# Patient Record
Sex: Female | Born: 1960 | Race: White | Hispanic: No | Marital: Married | Smoking: Never smoker
Health system: Southern US, Community
[De-identification: ages and names within clinical notes are randomized; demographics above are authoritative.]

## PROBLEM LIST (undated history)

## (undated) DIAGNOSIS — G459 Transient cerebral ischemic attack, unspecified: Secondary | ICD-10-CM

## (undated) DIAGNOSIS — I639 Cerebral infarction, unspecified: Secondary | ICD-10-CM

## (undated) DIAGNOSIS — R519 Headache, unspecified: Secondary | ICD-10-CM

## (undated) DIAGNOSIS — C439 Malignant melanoma of skin, unspecified: Secondary | ICD-10-CM

## (undated) DIAGNOSIS — E538 Deficiency of other specified B group vitamins: Secondary | ICD-10-CM

## (undated) DIAGNOSIS — Z9289 Personal history of other medical treatment: Secondary | ICD-10-CM

## (undated) DIAGNOSIS — T7840XA Allergy, unspecified, initial encounter: Secondary | ICD-10-CM

## (undated) DIAGNOSIS — E785 Hyperlipidemia, unspecified: Secondary | ICD-10-CM

## (undated) DIAGNOSIS — I2510 Atherosclerotic heart disease of native coronary artery without angina pectoris: Secondary | ICD-10-CM

## (undated) DIAGNOSIS — R51 Headache: Secondary | ICD-10-CM

## (undated) DIAGNOSIS — I509 Heart failure, unspecified: Secondary | ICD-10-CM

## (undated) DIAGNOSIS — I429 Cardiomyopathy, unspecified: Secondary | ICD-10-CM

## (undated) DIAGNOSIS — I10 Essential (primary) hypertension: Secondary | ICD-10-CM

## (undated) DIAGNOSIS — K5730 Diverticulosis of large intestine without perforation or abscess without bleeding: Secondary | ICD-10-CM

## (undated) DIAGNOSIS — F411 Generalized anxiety disorder: Secondary | ICD-10-CM

## (undated) DIAGNOSIS — Z8489 Family history of other specified conditions: Secondary | ICD-10-CM

## (undated) DIAGNOSIS — J302 Other seasonal allergic rhinitis: Secondary | ICD-10-CM

## (undated) DIAGNOSIS — E559 Vitamin D deficiency, unspecified: Secondary | ICD-10-CM

## (undated) DIAGNOSIS — I447 Left bundle-branch block, unspecified: Secondary | ICD-10-CM

## (undated) DIAGNOSIS — F419 Anxiety disorder, unspecified: Secondary | ICD-10-CM

## (undated) DIAGNOSIS — S83209A Unspecified tear of unspecified meniscus, current injury, unspecified knee, initial encounter: Secondary | ICD-10-CM

## (undated) HISTORY — DX: Unspecified tear of unspecified meniscus, current injury, unspecified knee, initial encounter: S83.209A

## (undated) HISTORY — DX: Deficiency of other specified B group vitamins: E53.8

## (undated) HISTORY — DX: Atherosclerotic heart disease of native coronary artery without angina pectoris: I25.10

## (undated) HISTORY — DX: Cardiomyopathy, unspecified: I42.9

## (undated) HISTORY — PX: TONSILLECTOMY: SUR1361

## (undated) HISTORY — DX: Personal history of other medical treatment: Z92.89

## (undated) HISTORY — DX: Left bundle-branch block, unspecified: I44.7

## (undated) HISTORY — DX: Allergy, unspecified, initial encounter: T78.40XA

## (undated) HISTORY — PX: KNEE SURGERY: SHX244

## (undated) HISTORY — PX: BREAST REDUCTION SURGERY: SHX8

## (undated) HISTORY — DX: Cerebral infarction, unspecified: I63.9

## (undated) HISTORY — DX: Generalized anxiety disorder: F41.1

## (undated) HISTORY — DX: Essential (primary) hypertension: I10

## (undated) HISTORY — DX: Other seasonal allergic rhinitis: J30.2

## (undated) HISTORY — DX: Vitamin D deficiency, unspecified: E55.9

## (undated) HISTORY — PX: CARDIAC CATHETERIZATION: SHX172

## (undated) HISTORY — DX: Heart failure, unspecified: I50.9

## (undated) HISTORY — PX: REPAIR QUADRICEPS / HAMSTRING MUSCLE: SUR1204

## (undated) HISTORY — DX: Hyperlipidemia, unspecified: E78.5

## (undated) HISTORY — PX: REDUCTION MAMMAPLASTY: SUR839

## (undated) HISTORY — PX: ABLATION: SHX5711

---

## 1998-01-01 ENCOUNTER — Ambulatory Visit (HOSPITAL_COMMUNITY): Admission: RE | Admit: 1998-01-01 | Discharge: 1998-01-01 | Payer: Self-pay | Admitting: Obstetrics and Gynecology

## 1998-05-14 ENCOUNTER — Ambulatory Visit (HOSPITAL_COMMUNITY): Admission: RE | Admit: 1998-05-14 | Discharge: 1998-05-14 | Payer: Self-pay | Admitting: Obstetrics and Gynecology

## 1998-05-14 ENCOUNTER — Encounter: Payer: Self-pay | Admitting: Obstetrics and Gynecology

## 1998-10-15 ENCOUNTER — Ambulatory Visit (HOSPITAL_COMMUNITY): Admission: RE | Admit: 1998-10-15 | Discharge: 1998-10-15 | Payer: Self-pay | Admitting: Cardiovascular Disease

## 1999-12-30 ENCOUNTER — Encounter (INDEPENDENT_AMBULATORY_CARE_PROVIDER_SITE_OTHER): Payer: Self-pay | Admitting: Specialist

## 1999-12-30 ENCOUNTER — Inpatient Hospital Stay (HOSPITAL_COMMUNITY): Admission: AD | Admit: 1999-12-30 | Discharge: 2000-01-02 | Payer: Self-pay | Admitting: Family Medicine

## 2000-01-28 ENCOUNTER — Other Ambulatory Visit: Admission: RE | Admit: 2000-01-28 | Discharge: 2000-01-28 | Payer: Self-pay | Admitting: Obstetrics and Gynecology

## 2000-10-12 ENCOUNTER — Encounter: Payer: Self-pay | Admitting: Internal Medicine

## 2000-10-12 ENCOUNTER — Ambulatory Visit (HOSPITAL_COMMUNITY): Admission: RE | Admit: 2000-10-12 | Discharge: 2000-10-12 | Payer: Self-pay | Admitting: Internal Medicine

## 2001-04-05 ENCOUNTER — Other Ambulatory Visit: Admission: RE | Admit: 2001-04-05 | Discharge: 2001-04-05 | Payer: Self-pay | Admitting: Obstetrics and Gynecology

## 2001-07-11 ENCOUNTER — Encounter: Admission: RE | Admit: 2001-07-11 | Discharge: 2001-07-11 | Payer: Self-pay | Admitting: Internal Medicine

## 2001-07-11 ENCOUNTER — Encounter: Payer: Self-pay | Admitting: Internal Medicine

## 2001-11-24 ENCOUNTER — Encounter: Payer: Self-pay | Admitting: Obstetrics and Gynecology

## 2001-11-24 ENCOUNTER — Ambulatory Visit (HOSPITAL_COMMUNITY): Admission: RE | Admit: 2001-11-24 | Discharge: 2001-11-24 | Payer: Self-pay | Admitting: Obstetrics and Gynecology

## 2001-12-05 ENCOUNTER — Encounter: Payer: Self-pay | Admitting: Obstetrics and Gynecology

## 2001-12-05 ENCOUNTER — Encounter: Admission: RE | Admit: 2001-12-05 | Discharge: 2001-12-05 | Payer: Self-pay | Admitting: Obstetrics and Gynecology

## 2002-06-14 ENCOUNTER — Other Ambulatory Visit: Admission: RE | Admit: 2002-06-14 | Discharge: 2002-06-14 | Payer: Self-pay | Admitting: Obstetrics and Gynecology

## 2002-12-04 ENCOUNTER — Ambulatory Visit (HOSPITAL_COMMUNITY): Admission: RE | Admit: 2002-12-04 | Discharge: 2002-12-04 | Payer: Self-pay | Admitting: Internal Medicine

## 2003-07-19 ENCOUNTER — Other Ambulatory Visit: Admission: RE | Admit: 2003-07-19 | Discharge: 2003-07-19 | Payer: Self-pay | Admitting: Obstetrics and Gynecology

## 2003-12-05 ENCOUNTER — Ambulatory Visit (HOSPITAL_COMMUNITY): Admission: RE | Admit: 2003-12-05 | Discharge: 2003-12-05 | Payer: Self-pay | Admitting: Internal Medicine

## 2004-07-24 ENCOUNTER — Other Ambulatory Visit: Admission: RE | Admit: 2004-07-24 | Discharge: 2004-07-24 | Payer: Self-pay | Admitting: Obstetrics and Gynecology

## 2004-10-03 ENCOUNTER — Encounter (INDEPENDENT_AMBULATORY_CARE_PROVIDER_SITE_OTHER): Payer: Self-pay | Admitting: *Deleted

## 2004-10-03 ENCOUNTER — Ambulatory Visit (HOSPITAL_COMMUNITY): Admission: RE | Admit: 2004-10-03 | Discharge: 2004-10-03 | Payer: Self-pay | Admitting: Obstetrics and Gynecology

## 2004-10-31 ENCOUNTER — Emergency Department (HOSPITAL_COMMUNITY): Admission: EM | Admit: 2004-10-31 | Discharge: 2004-11-01 | Payer: Self-pay | Admitting: Emergency Medicine

## 2004-11-04 ENCOUNTER — Ambulatory Visit (HOSPITAL_BASED_OUTPATIENT_CLINIC_OR_DEPARTMENT_OTHER): Admission: RE | Admit: 2004-11-04 | Discharge: 2004-11-04 | Payer: Self-pay | Admitting: Orthopaedic Surgery

## 2004-11-04 ENCOUNTER — Ambulatory Visit (HOSPITAL_COMMUNITY): Admission: RE | Admit: 2004-11-04 | Discharge: 2004-11-04 | Payer: Self-pay | Admitting: Orthopaedic Surgery

## 2005-01-02 ENCOUNTER — Ambulatory Visit (HOSPITAL_COMMUNITY): Admission: RE | Admit: 2005-01-02 | Discharge: 2005-01-02 | Payer: Self-pay | Admitting: Obstetrics and Gynecology

## 2006-01-15 ENCOUNTER — Ambulatory Visit (HOSPITAL_COMMUNITY): Admission: RE | Admit: 2006-01-15 | Discharge: 2006-01-15 | Payer: Self-pay | Admitting: Obstetrics and Gynecology

## 2007-01-21 ENCOUNTER — Ambulatory Visit (HOSPITAL_COMMUNITY): Admission: RE | Admit: 2007-01-21 | Discharge: 2007-01-21 | Payer: Self-pay | Admitting: Internal Medicine

## 2007-12-29 ENCOUNTER — Encounter (INDEPENDENT_AMBULATORY_CARE_PROVIDER_SITE_OTHER): Payer: Self-pay | Admitting: Obstetrics and Gynecology

## 2007-12-29 ENCOUNTER — Ambulatory Visit (HOSPITAL_COMMUNITY): Admission: RE | Admit: 2007-12-29 | Discharge: 2007-12-29 | Payer: Self-pay | Admitting: Obstetrics and Gynecology

## 2008-01-27 ENCOUNTER — Ambulatory Visit (HOSPITAL_COMMUNITY): Admission: RE | Admit: 2008-01-27 | Discharge: 2008-01-27 | Payer: Self-pay | Admitting: Obstetrics and Gynecology

## 2008-08-24 ENCOUNTER — Emergency Department (HOSPITAL_BASED_OUTPATIENT_CLINIC_OR_DEPARTMENT_OTHER): Admission: EM | Admit: 2008-08-24 | Discharge: 2008-08-24 | Payer: Self-pay | Admitting: Emergency Medicine

## 2008-08-24 ENCOUNTER — Ambulatory Visit: Payer: Self-pay | Admitting: Diagnostic Radiology

## 2009-02-12 ENCOUNTER — Ambulatory Visit (HOSPITAL_COMMUNITY): Admission: RE | Admit: 2009-02-12 | Discharge: 2009-02-12 | Payer: Self-pay | Admitting: Obstetrics and Gynecology

## 2009-04-09 ENCOUNTER — Ambulatory Visit (HOSPITAL_COMMUNITY): Admission: RE | Admit: 2009-04-09 | Discharge: 2009-04-09 | Payer: Self-pay | Admitting: Internal Medicine

## 2009-06-10 ENCOUNTER — Emergency Department (HOSPITAL_BASED_OUTPATIENT_CLINIC_OR_DEPARTMENT_OTHER): Admission: EM | Admit: 2009-06-10 | Discharge: 2009-06-10 | Payer: Self-pay | Admitting: Emergency Medicine

## 2010-02-02 ENCOUNTER — Encounter: Payer: Self-pay | Admitting: Obstetrics and Gynecology

## 2010-02-20 ENCOUNTER — Other Ambulatory Visit: Payer: Self-pay | Admitting: Obstetrics and Gynecology

## 2010-02-20 ENCOUNTER — Other Ambulatory Visit (HOSPITAL_COMMUNITY): Payer: Self-pay | Admitting: Family Medicine

## 2010-02-20 DIAGNOSIS — Z1231 Encounter for screening mammogram for malignant neoplasm of breast: Secondary | ICD-10-CM

## 2010-03-12 ENCOUNTER — Ambulatory Visit (HOSPITAL_COMMUNITY)
Admission: RE | Admit: 2010-03-12 | Discharge: 2010-03-12 | Disposition: A | Discharge status: Home / Self Care | Payer: Managed Care, Other (non HMO) | Source: Ambulatory Visit | Attending: Family Medicine | Admitting: Family Medicine

## 2010-03-12 DIAGNOSIS — Z1231 Encounter for screening mammogram for malignant neoplasm of breast: Secondary | ICD-10-CM | POA: Insufficient documentation

## 2010-03-13 ENCOUNTER — Ambulatory Visit (HOSPITAL_BASED_OUTPATIENT_CLINIC_OR_DEPARTMENT_OTHER)
Admission: RE | Admit: 2010-03-13 | Discharge: 2010-03-13 | Disposition: A | Discharge status: Home / Self Care | Payer: BC Managed Care – PPO | Source: Ambulatory Visit | Attending: Orthopedic Surgery | Admitting: Orthopedic Surgery

## 2010-03-13 DIAGNOSIS — M9420 Chondromalacia, unspecified site: Secondary | ICD-10-CM | POA: Insufficient documentation

## 2010-03-13 DIAGNOSIS — M23305 Other meniscus derangements, unspecified medial meniscus, unspecified knee: Secondary | ICD-10-CM | POA: Insufficient documentation

## 2010-03-13 DIAGNOSIS — M23302 Other meniscus derangements, unspecified lateral meniscus, unspecified knee: Secondary | ICD-10-CM | POA: Insufficient documentation

## 2010-03-26 NOTE — Op Note (Signed)
  Gail Bailey, Gail Bailey NO.:  [id number]  MEDICAL RECORD NO.:  [id number]           PATIENT TYPE:  LOCATION:                                 FACILITY:  PHYSICIAN:  Loreta Ave, M.D. DATE OF BIRTH:  06-30-1960  DATE OF PROCEDURE:  03/13/2010 DATE OF DISCHARGE:                              OPERATIVE REPORT   PREOPERATIVE DIAGNOSIS:  Right knee degenerative arthritis medial compartment, chondromalacia patella, medial meniscus tear.  POSTOPERATIVE DIAGNOSIS:  Right knee degenerative arthritis medial compartment, chondromalacia patella, medial meniscus tear with extensive tearing anterior half medial meniscus, flap tear posterolateral meniscus.  Grade 4 changes medial compartment.  Grade 3 and 4 changes patellofemoral joint.  Grade 2, mild grade 3 changes lateral tibial plateau with chondral loose bodies.  PROCEDURE:  Right knee exam under anesthesia, arthroscopy, chondroplasty all three compartments.  Debridement medial and lateral meniscus.  SURGEON:  Loreta Ave, MD  ASSISTANT:  Genene Churn. Barry Dienes, [place]  ANESTHESIA:  General.  BLOOD LOSS:  Minimal.  SPECIMENS:  None.  CULTURES:  None.  COMPLICATIONS:  None.  DRESSING:  Soft compressive.  TOURNIQUET:  Not employed.  PROCEDURE:  The patient brought to the operating room, placed in the operating table in supine position.  After adequate anesthesia had been obtained, leg holder applied.  Leg prepped and draped in usual sterile fashion.  Lateral tracking but did have a lot of tethering patellofemoral joint.  Stable ligaments.  After being prepped and draped, two portals, one each medial and lateral parapatellar. Arthroscope introduced, knee distended and inspected.  Grade 3 and even some 4 changes peak of patella inferior aspect.  Chondroplasty to stable surface.  Trochlea looked good.  A lot of synovitis debrided.  Lateral tracking but did not tether enough to warrant release.  Cruciate ligament  was intact.  Medial compartment exposed bone over half the femur and a third of the tibia.  Chondral flaps loose pieces removed. Large complex tear anterior half medial meniscus saucerized tapered smoothly retaining a lot of the posterior half.  Cruciate ligament was intact.  Lateral meniscus, small flap tear posterior third debrided out, tapered in.  Some focal chondromalacia on the plateau debrided.  This did not extend into assist the subchondral of the tibia. Entire knee examined.  No other findings appreciated.  Instruments and fluid removed.  Portals of knee injected with Marcaine, knee injected with Depo-Medrol.  Portals closed with nylon.  Sterile compressive dressing applied.  Anesthesia reversed.  Brought to recovery room. Tolerated surgery well.  No complications.     Loreta Ave, M.D.     DFM/MEDQ  D:  03/13/2010  T:  03/14/2010  Job:  [id number]  Electronically Signed by Mckinley Jewel M.D. on 03/26/2010 01:35:20 PM

## 2010-05-05 ENCOUNTER — Other Ambulatory Visit: Payer: Self-pay | Admitting: Obstetrics and Gynecology

## 2010-05-08 ENCOUNTER — Other Ambulatory Visit: Payer: Self-pay | Admitting: Obstetrics and Gynecology

## 2010-05-27 NOTE — Op Note (Signed)
NAMEMEEKA, CARTELLI NO.:  [id number]   MEDICAL RECORD NO.:  [id number]          PATIENT TYPE:  AMB   LOCATION:  SDC                           FACILITY:  [facility]   PHYSICIAN:  Miguel Aschoff, M.D.       DATE OF BIRTH:  July 06, 1960   DATE OF PROCEDURE:  12/29/2007  DATE OF DISCHARGE:                               OPERATIVE REPORT   j   PREOPERATIVE DIAGNOSES:  Recurrent menorrhagia status post NovaSure  endometrial ablation.   POSTOPERATIVE DIAGNOSES:  Recurrent menorrhagia status post NovaSure  endometrial ablation.   PROCEDURE:  Cervical dilatation, hysteroscopy, uterine curettage,  followed by Thermachoice endometrial ablation.   SURGEON:  Miguel Aschoff, MD   ANESTHESIA:  General.   COMPLICATIONS:  None.   JUSTIFICATIONS:  The patient is a 50 year old white female who  previously underwent NovaSure endometrial ablation for menorrhagia  approximately 3 years ago.  The patient now has developed renewed heavy  vaginal bleeding and requests an attempt at outpatient surgery to  correct the heavy flow and avoid major surgery such as hysterectomy.  The risks and benefits of the Thermachoice endometrial ablation were  discussed with the patient as well as the option of using Mirena IUD,  and the patient is now opted to undergo D&C hysteroscopy and  Thermachoice ablation.  Informed consent has been obtained.   DESCRIPTION OF PROCEDURE:  The patient was taken to the operating room,  placed in supine position.  General anesthesia was administered without  difficulty.  She was then placed in dorsal lithotomy position, prepped  and draped in the usual sterile fashion.  Once this was done,  examination under anesthesia revealed normal external genitalia, normal  Bartholin and Skene's gland.  Normal urethra.  The vaginal vault was  without gross lesion.  The cervix was without gross lesion.  The adnexa  revealed no masses.  At this point, speculum was placed in the vaginal  vault.  Anterior cervical lip was grasped with tenaculum.  The  endocervical canal was then dilated using serial Pratt dilators until a  #23 Pratt dilator could be passed.  Once this was done, the diagnostic  hysteroscope was advanced through endocervix, no endocervical lesions  were noted.  The endometrial cavity was then inspected.  There did not  appear to be any submucous myomas or endometrial polyps present.  At  this point, the hysteroscope was removed and at this point, the  Thermachoice endometrial ablation unit was primed and then placed into  uterine cavity in an 8-minute treatment cycle at 87 degrees, was carried  out without difficulty under the pressure constraints of the  Thermachoice unit.  On completion of the treatment cycle the  Thermachoice unit was removed intact and at this point the cervix was  injected with total 10 mL of 1% Xylocaine for postoperative analgesia.  Hemostasis was readily achieved.   The plan is for the patient to be discharged home.  She was taken to  recovery room in satisfactory condition.  Estimated blood loss was  minimal.  Medications for home include Darvocet-N 100  one every 4-6  hours as needed for pain, doxycycline one twice a day x3 days.   She will be seen back for followup exam in 4 weeks.  She knows to call  if there any problems such as fever, pain, or heavy bleeding.  She was  sent home in satisfactory condition.      Miguel Aschoff, M.D.  Electronically Signed     AR/MEDQ  D:  12/29/2007  T:  12/30/2007  Job:  [id number]

## 2010-05-30 NOTE — Op Note (Signed)
NAMEANGELA, Gail Bailey NO.:  [id number]   MEDICAL RECORD NO.:  [id number]          PATIENT TYPE:  AMB   LOCATION:  DSC                          FACILITY:  [facility]   PHYSICIAN:  Claude Manges. Whitfield, M.D.DATE OF BIRTH:  [birth year]   DATE OF PROCEDURE:  11/04/2004  DATE OF DISCHARGE:                                 OPERATIVE REPORT   PREOPERATIVE DIAGNOSIS:  Embedded foreign body (sewing needle), plantar  aspect left foot.   POSTOPERATIVE DIAGNOSIS:  Embedded foreign body (sewing needle), plantar  aspect left foot.   PROCEDURES:  1.  Irrigation and debridement of entrance wound, plantar aspect left foot.  2.  Removal of foreign body.   SURGEON:  Claude Manges. Cleophas Dunker, M.D.   ANESTHESIA:  General.   COMPLICATIONS:  None.   HISTORY:  A 50 year old female who stepped on a sewing needle Friday night,  i.e., four nights ago, without wearing shoes or socks.  She experienced  immediate onset of pain.  Was seen at the [facility] emergency room with x-  rays revealing an embedded sewing needle in her foot.  She was placed on  Cipro and asked to follow up in the office yesterday.  The foreign body was  confirmed in the foot by new x-rays.  There was some early redness around  the wound, possibly consistent with some early infection.  The patient is  now to have removal of the foreign body with I&D of the entrance site.   PROCEDURE:  With the patient comfortable on the operating table, she was  placed under general orotracheal anesthesia.  The left foot was then prepped  with DuraPrep from the tips of the toes to the midcalf.  Sterile draping was  performed.  An Esmarch was then placed about the foot, maintained at the  ankle, protecting neurovascular bundles with 4 x 4 gauze.  The entrance  wound was on the plantar aspect of the foot between the first and second  metatarsals.  The entrance site was elliptically excised, incision made  probably a half an inch in length.   Beneath the skin there was obvious  foreign body contamination with what appeared to be either a piece of carpet  or a piece of thread and some grayish material as if it were a reaction to  the metal.  This was debrided.  The rongeur was inserted and I actually was  able to retrieve the needle without much difficulty and the remove it in one  large piece.  Further debridement was performed of obvious contaminated and  foreign body material.  Then this was copiously irrigated with saline  solution such that the remaining wound appeared to be perfectly intact.  Cultures were obtained before irrigation and debridement, both aerobically  and anaerobically.  The patient had been on Cipro preoperatively.   The skin was then closed with interrupted 4-0 Ethilon.  A sterile bulky  dressing was applied, followed by Webril and an Ace bandage and a wooden  shoe.   The patient tolerated the procedure without complications.   PLAN:  Continue with  the Cipro 500 mg twice a day and Mepergan Fortis for  pain.  Office Monday.      Claude Manges. Cleophas Dunker, M.D.  Electronically Signed     PWW/MEDQ  D:  11/04/2004  T:  11/04/2004  Job:  [id number]

## 2010-05-30 NOTE — Op Note (Signed)
NAMEEMILINE, MANCEBO NO.:  [id number]   MEDICAL RECORD NO.:  [id number]          PATIENT TYPE:  AMB   LOCATION:  SDC                           [facility]ACILITY[facility]  [facility]   PHYSICIAN:  Miguel Aschoff, M.D.       DATE OF BIRTH:  1960/06/10   DATE OF PROCEDURE:  10/03/2004  DATE OF DISCHARGE:                                 OPERATIVE REPORT   PREOPERATIVE DIAGNOSIS:  Menorrhagia.   POSTOPERATIVE DIAGNOSIS:  Menorrhagia.   PROCEDURE:  Is cervical dilatation and hysteroscopy, uterine curettage,  NovaSure endometrial ablation.   SURGEON:  Dr. Miguel Aschoff   ANESTHESIA:  General.   COMPLICATIONS:  None.   JUSTIFICATION:  The patient is a 50 year old  white female with history of  progressively heavy menses. The patient has requested that this now be  treated via endometrial ablation in an effort to control the heavy menstrual  cycles. Risks, benefits of the procedure were discussed with the patient and  informed consent was obtained.   DESCRIPTION OF PROCEDURE:  The patient was taken to the operating room and  placed in the supine position. General anesthesia was administered without  difficulty. She was then placed in dorsolithotomy position, prepped and  draped in the usual sterile fashion. Examination was then carried out which  revealed the uterus to be anterior, normal size and shape, no adnexal masses  were noted. The speculum was placed in the vaginal vault, anterior cervical  lip was grasped with tenaculum and endometrial cavity was sounded to 8 cm.  The cervical length was then measured to 4 cm. The cervix was further  dilated until 25 Pratt dilator could be passed. At this point the diagnostic  hysteroscope was advanced through the cervix and the uterine cavity was  inspected. No polyps or submucous myomas were noted. Cavity appeared grossly  normal. At this point the hysteroscope was removed and the NovaSure  endometrial ablation instrument was advanced through the  cervix. The cavity  width of 312.5 cm was then determined. Cavity assessment testing was then  carried out and passed and at this point a treatment cycle at 77 watts for 1  minute 22 seconds was carried out without difficulty. Following this all  instruments were removed. The NovaSure ablation unit was removed intact. The  instruments were removed. There was small oozing occurring from where the  tenaculum was holding the cervix. This was were controlled by pressure and a  small piece of Gelfoam. Estimated blood loss from procedure is approximately  50 mL. The patient tolerated the procedure well and went to the recovery  room in satisfactory condition.      Miguel Aschoff, M.D.  Electronically Signed    AR/MEDQ  D:  10/03/2004  T:  10/03/2004  Job:  [id number]

## 2010-05-30 NOTE — Discharge Summary (Signed)
[facility]  Patient:    Gail Bailey, Gail Bailey                        MRN: [id number] Adm. Date:  [id number] Disc. Date: [id number] Attending:  Miguel Aschoff Dictator:   Leilani Able, P.A.                           Discharge Summary  FINAL DIAGNOSES:              1. Intrauterine pregnancy at [redacted] weeks                                  gestation.                               2. Elective repeat cesarean section.                               3. Refuses vaginal birth after cesarean.                               4. Desires permanent sterilization.  PROCEDURE:                    1. Elective repeat cesarean section.                               2. Bilateral tubal sterilization.  SURGEON:                      Miguel Aschoff, M.D.  ASSISTANT:  COMPLICATIONS:                None.  HISTORY/HOSPITAL COURSE:      This 50 year old, G2, P1-0-0-1, presents at [redacted] weeks gestation for a repeat cesarean section. She had a previous cesarean section in 1997 for a breech presentation. She was now at term and requests a repeat cesarean section and declined VBAC. She also requests tubal sterilization. An informed consent has been obtained. She is taken to the operating room by Dr. Miguel Aschoff where a repeat cesarean section was performed with a delivery of a 7-pound 14-ounce female infant with Apgars of 8 and 9. Delivery went without complication.  Patients postoperative course was benign without significant fevers. She is felt ready for discharge on postoperative day #3.  DISCHARGE INSTRUCTIONS:       She was sent home on a regular diet, told to decrease activities, told to continue prenatal vitamins, was given Tylox, #30, one to two every four hours as needed for pain. She was also told she could use Motrin every six hours as needed and to follow up in the office in four weeks. DD:  01/23/00 TD:  01/23/00 Job: [id number]

## 2010-05-30 NOTE — Op Note (Signed)
[facility]  Patient:    Gail Bailey, Gail Bailey                        MRN: [id number] Proc. Date: 12/30/99 Adm. Date:  [id number] Attending:  Miguel Aschoff                           Operative Report  PREOPERATIVE DIAGNOSIS:       1. Intrauterine pregnancy at 39 weeks for                                  elective repeat cesarean section.                               2. Desired sterilization.  POSTOPERATIVE DIAGNOSIS:      1. Intrauterine pregnancy at 39 weeks for                                  elective repeat cesarean section.                               2. Desired sterilization.                               3. Delivery of viable female infant, Apgars 8/9.  PROCEDURE:                    1. Elective repeat cesarean section.                               2. Bilateral tubal sterilization.  SURGEON:                      Miguel Aschoff, M.D.  ASSISTANT:                    Luvenia Redden, M.D.  ANESTHESIA:                   Spinal.  COMPLICATIONS:                None.  ESTIMATED BLOOD LOSS:  JUSTIFICATION:                Patient is a 50 year old white female, gravida 2, para 1-0-0-1, with an estimated date of confinement of January 05, 2000. Patient underwent previous cesarean section in 1997 for a breech infant. She is now at term and requests that a repeat cesarean section be carried out at this time. She declines VBAC. In addition, she requests a tubal sterilization. Informed consent has been obtained.  DESCRIPTION OF PROCEDURE:     Patient was taken to the operating room, placed in a sitting position, and spinal anesthetic was administered without difficulty. She was then placed in a supine position, deviated to the left, and prepped and draped in the usual sterile fashion. A Foley catheter was placed. After this was done a previous Pfannenstiel incision was re-incised and the incision was extended down through the subcutaneous tissue until the fascia  was identified. The  fascia was then incised transversely. It was then separated from the underlying rectus muscles. Rectus muscles were divided in the midline. The peritoneum was then identified and entered carefully avoiding underlying structures. The peritoneal incision was extended under direct visualization. A bladder flap was then created and protected with a bladder blade. An elliptical transverse incision was then made into the lower uterine segment. The amniotic cavity was entered, clear fluid was obtained. At this point, the patient was delivered of a viable female infant from a right occiput anterior position without difficulty and nuchal cord x 2 was noted. One minute Apgar score was 8 and the five minute Apgar score was 9. The baby was handed to the pediatric team in attendance. Cord bloods were obtained for appropriate studies. The placenta was delivered and the uterus was evacuated of any remaining products of conception. The angles of the uterine incision were then ligated using figure-of-eight sutures of #1 Vicryl. The uterus was then closed with two sutures of 0 Vicryl. The angles of the uterine incision were ligated and then the uterus was closed in layers. The first layer was a running interlocking suture of #1 Vicryl followed by an imbricating suture of #1 Vicryl. The bladder flap was then reapproximated using running continuous 2-0 Vicryl suture.  At this point attention was directed to the right tube which was positively identified. It was grasped in its midportion with a Babcock clamp and knuckle of tube was created and this knuckle of tube was ligated with two ligatures of 0 plain gut. The area of tube above the ligatures was excised and tubal stumps were cauterized. The identical procedure was then carried out on the left side. After this was done lap counts were done and found to be correct. Tubes and ovaries were otherwise within normal limits. The abdomen was  irrigated with warm saline and then the abdomen was closed. The parietal peritoneum was closed using running continuous 0 Vicryl suture. Rectus muscles were reapproximated using running continuous 0 Vicryl suture. The fascia was closed using two sutures of 0 Vicryl, each starting at the lateral fascial angles and meeting in the midline. Subcutaneous tissue and skin were closed using staples. The estimated blood loss was approximately 600 cc. The patient tolerated the procedure well and went to the recovery room in satisfactory condition. DD:  12/30/99 TD:  12/30/99 Job: [id number]

## 2010-10-13 HISTORY — PX: OTHER SURGICAL HISTORY: SHX169

## 2010-10-16 LAB — BASIC METABOLIC PANEL
BUN: 10 mg/dL (ref 6–23)
CO2: 27 mEq/L (ref 19–32)
Calcium: 8.5 mg/dL (ref 8.4–10.5)
Creatinine, Ser: 0.75 mg/dL (ref 0.4–1.2)
Glucose, Bld: 104 mg/dL — ABNORMAL HIGH (ref 70–99)
Sodium: 134 mEq/L — ABNORMAL LOW (ref 135–145)

## 2010-10-16 LAB — CBC
Hemoglobin: 14.3 g/dL (ref 12.0–15.0)
MCHC: 34.3 g/dL (ref 30.0–36.0)
RDW: 12.9 % (ref 11.5–15.5)

## 2010-10-16 LAB — PROTIME-INR: INR: 1 (ref 0.00–1.49)

## 2010-11-21 ENCOUNTER — Encounter: Payer: Self-pay | Admitting: Neurology

## 2010-11-25 ENCOUNTER — Ambulatory Visit (INDEPENDENT_AMBULATORY_CARE_PROVIDER_SITE_OTHER): Payer: BC Managed Care – PPO | Admitting: Neurology

## 2010-11-25 ENCOUNTER — Encounter: Payer: Self-pay | Admitting: Neurology

## 2010-11-25 DIAGNOSIS — R6889 Other general symptoms and signs: Secondary | ICD-10-CM

## 2010-11-25 DIAGNOSIS — R51 Headache: Secondary | ICD-10-CM

## 2010-11-25 DIAGNOSIS — R410 Disorientation, unspecified: Secondary | ICD-10-CM

## 2010-11-25 NOTE — Patient Instructions (Signed)
Your MRI is scheduled for Tuesday, November 20th at 8:00am.  Please arrive to [facility] first floor admitting by 7:45am.  [phone].  Your follow up with Dr. Modesto Charon is Wednesday, November 21st at 12:00 noon.

## 2010-11-25 NOTE — Progress Notes (Signed)
Dear Dr. Oneta Rack,  Thank you for having me see Gail Bailey in consultation today at [facility] for her problem with dizziness or vertigo. As you may recall, she is a 50 y.o. year old female with a history of remote migraines who present with dizziness when she turns her head on a motorcycle since August. She feels like her "head is about to explode" and feels disoriented. This sounds like it last seconds to minutes. She has tinnitus, but no changes in her hearing. She does not get this feeling of imbalance or dizziness any other time - except perhaps when she turns her head in a car. Notably she bought her motorcycle in August as well.  She also has headaches that are occurring about twice per week and are much more severe than her typical headaches. They are bifrontal, although one had right sided lateralization. There is no photophobia or phonophobia. No nausea or vomiting. They last hours. She is only taking Aleve for them.  She saw her ENT, Dr. Jearld Fenton, and he has recently placed tubes in her ears to help with fluid drainage. This has not helped the symptoms. He also did nystagmography, which we do not have the report of.  Past Medical History   Diagnosis  Date   .  Asthma    .  Seasonal allergies    .  Irregular periods/menstrual cycles    .  Torn meniscus     Past Surgical History   Procedure  Date   .  Knee surgery    .  Cesarean section    .  Tubes in ears  10/12   .  Tonsillectomy     History    Social History   .  Marital Status:  Divorced     Spouse Name:  N/A     Number of Children:  N/A   .  Years of Education:  N/A    Social History Main Topics   .  Smoking status:  Never Smoker   .  Smokeless tobacco:  Never Used   .  Alcohol Use:  Yes   .  Drug Use:  None   .  Sexually Active:  None    Other Topics  Concern   .  None    Social History Narrative   .  None    FamHx: Mother with ischemic stroke, sister with migraines.  ROS: 13 systems were reviewed and are  notable for sinus congestion and asthma. All other review of systems are unremarkable.  Examination:  Filed Vitals:    11/25/10 1310   BP:  112/78   Pulse:  84   Height:  5' 0.5" (1.537 m)   Weight:  122 lb (55.339 kg)    In general, well appearing older woman.  Cardiovascular:  The patient has a regular rate and rhythm and no carotid bruits.  Fundoscopy:  Disks are flat. Vessel caliber within normal limits.  Mental status:  The patient is oriented to person, place and time. Recent and remote memory are intact. Attention span and concentration are normal. Language including repetition, naming, following commands are intact.  Fund of knowledge of current and historical events, as well as vocabulary are normal.  Cranial Nerves:  Pupils are equally round and reactive to light. Visual fields full to confrontation. Extraocular movements are intact without nystagmus. Facial sensation and muscles of mastication are intact. Muscles of facial expression are symmetric. Hearing intact to bilateral finger rub. Tongue protrusion, uvula, palate  midline. Shoulder shrug intact  Motor: The patient has normal bulk and tone, no pronator drift. There are no adventitious movements.  5/5 bilaterally.  Reflexes:  Biceps Triceps Brachioradialis Knee Ankle  Right 1+ 1+ 1+ 2+ 2+  Left 1+ 1+ 1+ 2+ 2+  Toes down  Coordination: Normal finger to nose. No dysdiadokinesia.  Sensation is intact to temperature and vibration.  Gait and Station are normal. Tandem gait is intact. Romberg is negative  Vestibular testing: Dix-Hallpike -; no head shaking nystagmus; Head thrust, equivocal catchup saccade to left, +Fukuda walking test to left.    Impression: New headaches with symptoms of vestibular dysfunction, and signs of left sided vestibular weakness.  Recommendations: I would like to get an MRI of her brain with and without with thin cuts through the brainstem. I am particularly worried about pathology involving her  left vestibular nerve or apparatus. We are also going to get the results form Dr. Tona Sensing vestibular testing to see if there is left sided hypofunction noted. We will consider further vestibular testing based on this if the MRI is negative. We may end up treating her headaches with prophlyaxis to see if this helps her dizziness as well as I see dizziness and migraines frequently related.  We will see the patient back in about 1 week.   Thank you for having [place] see Gail Bailey in consultation. Feel free to contact me with any questions.   Lupita Raider Modesto Charon, MD  [facility], [place]  [address]  [place], [place] [address]  Phone: [phone]  Fax: [phone].

## 2010-12-02 ENCOUNTER — Ambulatory Visit (INDEPENDENT_AMBULATORY_CARE_PROVIDER_SITE_OTHER): Payer: Managed Care, Other (non HMO) | Admitting: Neurology

## 2010-12-02 ENCOUNTER — Encounter: Payer: Self-pay | Admitting: Neurology

## 2010-12-02 ENCOUNTER — Ambulatory Visit (HOSPITAL_COMMUNITY)
Admission: RE | Admit: 2010-12-02 | Discharge: 2010-12-02 | Payer: BC Managed Care – PPO | Source: Ambulatory Visit | Attending: Neurology | Admitting: Neurology

## 2010-12-02 VITALS — BP 140/80 | HR 80 | Wt 123.5 lb

## 2010-12-02 DIAGNOSIS — H832X9 Labyrinthine dysfunction, unspecified ear: Secondary | ICD-10-CM

## 2010-12-02 MED ORDER — PROPRANOLOL HCL 60 MG PO CP24: 60.0000 mg | ORAL_CAPSULE | Freq: Every day | ORAL | Status: DC

## 2010-12-03 ENCOUNTER — Ambulatory Visit: Payer: BC Managed Care – PPO | Admitting: Neurology

## 2010-12-03 NOTE — Progress Notes (Signed)
Dear Dr. Oneta Bailey,  I saw  Gail Bailey back in [facility] for her problem with disorientation when she turns her head to the left.  As you may recall, she is a 50 y.o. year old female with a history of headaches who since August has noticed what sounds like vertigo when she turns her head to the left on her motor bike.  She has also had worsening migrainous headaches since June with headaches occurring 2 times per week, and rare headaches requiring her to lie down.  They are described as bifrontal, worse on the right, with pain shooting backwards to the neck.  At her last clinic visit I got an MRI of her brain with thin cuts through the brainstem that was normal.  I did feel that her Gail Bailey stepping test was positive revealing left vestibular hypofunction.  Unfortunately, I did not get the results from her nystagmography done at Dr. Jearld Bailey office to confirm this hypofunction.  She returns, still only complaining of dizziness when she rides her motorbike and not at any other time, and only when she turns her head.  Headaches continue.  Medical history, social history, and family history were reviewed and have not changed since the last clinic visit.  Current Outpatient Prescriptions on File Prior to Visit  Medication Sig Dispense Refill  . clindamycin (CLEOCIN) 150 MG capsule Take 150 mg by mouth 3 (three) times daily.          No Known Allergies  ROS:  13 systems were reviewed and otherwise unremarkable.  Exam: . Filed Vitals:   12/02/10 1310  BP: 140/80  Pulse: 80  Weight: 123 lb 8 oz (56.019 kg)    In general, well appearing woman in NAD.  Vestibular testing: - HT, + Fukuda to the left.  Benign fundoscopy.  Impression/Recs:  I suspect the patient has both migrainous headaches, as well as left vestibular hypofunction.  They are likely two separate complaints.  However, it is possible by making the headaches better we may improve her vertiginous sensations.  I am  going to start her on propranolol as a preventative medication.  In addition I am going to get the vestibular testing results from Dr. Jearld Bailey.  If these appear inadequate I am likely going to refer her to the vestibular lab at [facility] for more extensive testing.   We will see the patient back in 2 months.  Gail Bailey Modesto Charon, MD [facility], [place]

## 2010-12-23 ENCOUNTER — Encounter: Payer: Self-pay | Admitting: Neurology

## 2010-12-23 ENCOUNTER — Telehealth: Payer: Self-pay | Admitting: Neurology

## 2010-12-23 NOTE — Telephone Encounter (Signed)
Called and spoke with the patient. Information given as per Dr. Modesto Charon re: referral to [facility]. The patient does wish to proceed with the referral. I told her that we would make the referral and that they should call her to schedule the appointment. Also told her to be sure and take her last MRI for the doctor to review. The patient states she will. I told her to call our office if she had not heard from Dr. Windy Canny office in a couple of weeks. No other issues. I will fax demographics, office encounters, insurance info and a letter from Dr. Modesto Charon to the [facility] today.

## 2010-12-23 NOTE — Telephone Encounter (Signed)
Called Dr. Windy Canny office to make the referral. The patient's appointment is 01/20/11 @ 0900. The patient is aware of the appointment. All info faxed to [phone].

## 2010-12-23 NOTE — Telephone Encounter (Signed)
Message copied by Benay Spice on Tue Dec 23, 2010  4:24 PM ------      Message from: Milas Gain      Created: Tue Dec 23, 2010  3:37 PM       [name]            Could you call Ms. Gail Bailey and let her know we finally got the results from her vestibular testing done at Dr. Tona Sensing office.            I think she needs to have  more complete evaluation and would like to send her to the [facility].  Let her know we are going to make a referral and to let us know if they don't call her in a couple of weeks.            When she goes she should bring the MRI brain with her that I looked at.            If you could take care of the referral.            It should go to      Dr. Margarito Courser, [facility]      [phone]      fax [phone]            I will send you a consult note to go with the referral.

## 2010-12-26 ENCOUNTER — Ambulatory Visit: Payer: BC Managed Care – PPO | Admitting: Neurology

## 2011-02-02 ENCOUNTER — Ambulatory Visit: Payer: Managed Care, Other (non HMO) | Admitting: Neurology

## 2011-03-23 ENCOUNTER — Other Ambulatory Visit (HOSPITAL_COMMUNITY): Payer: Self-pay | Admitting: Internal Medicine

## 2011-03-23 DIAGNOSIS — Z1231 Encounter for screening mammogram for malignant neoplasm of breast: Secondary | ICD-10-CM

## 2011-03-24 ENCOUNTER — Ambulatory Visit (HOSPITAL_COMMUNITY)
Admission: RE | Admit: 2011-03-24 | Discharge: 2011-03-24 | Disposition: A | Discharge status: Home / Self Care | Payer: BC Managed Care – PPO | Source: Ambulatory Visit | Attending: Internal Medicine | Admitting: Internal Medicine

## 2011-03-24 DIAGNOSIS — Z1231 Encounter for screening mammogram for malignant neoplasm of breast: Secondary | ICD-10-CM | POA: Insufficient documentation

## 2011-03-26 ENCOUNTER — Other Ambulatory Visit: Payer: Self-pay | Admitting: Internal Medicine

## 2011-03-26 DIAGNOSIS — R928 Other abnormal and inconclusive findings on diagnostic imaging of breast: Secondary | ICD-10-CM

## 2011-03-31 ENCOUNTER — Ambulatory Visit
Admission: RE | Admit: 2011-03-31 | Discharge: 2011-03-31 | Disposition: A | Discharge status: Home / Self Care | Payer: BC Managed Care – PPO | Source: Ambulatory Visit | Attending: Internal Medicine | Admitting: Internal Medicine

## 2011-03-31 DIAGNOSIS — R928 Other abnormal and inconclusive findings on diagnostic imaging of breast: Secondary | ICD-10-CM

## 2011-03-31 LAB — HM MAMMOGRAPHY

## 2011-04-17 ENCOUNTER — Other Ambulatory Visit: Payer: Self-pay | Admitting: Internal Medicine

## 2011-04-21 ENCOUNTER — Other Ambulatory Visit: Payer: Self-pay | Admitting: Internal Medicine

## 2011-04-21 DIAGNOSIS — I714 Abdominal aortic aneurysm, without rupture: Secondary | ICD-10-CM

## 2011-04-27 ENCOUNTER — Ambulatory Visit
Admission: RE | Admit: 2011-04-27 | Discharge: 2011-04-27 | Disposition: A | Discharge status: Home / Self Care | Payer: BC Managed Care – PPO | Source: Ambulatory Visit | Attending: Internal Medicine | Admitting: Internal Medicine

## 2011-04-27 DIAGNOSIS — I714 Abdominal aortic aneurysm, without rupture: Secondary | ICD-10-CM

## 2011-09-15 ENCOUNTER — Other Ambulatory Visit: Payer: Self-pay | Admitting: Internal Medicine

## 2011-09-15 DIAGNOSIS — N63 Unspecified lump in unspecified breast: Secondary | ICD-10-CM

## 2011-09-23 ENCOUNTER — Ambulatory Visit
Admission: RE | Admit: 2011-09-23 | Discharge: 2011-09-23 | Disposition: A | Discharge status: Home / Self Care | Payer: BC Managed Care – PPO | Source: Ambulatory Visit | Attending: Internal Medicine | Admitting: Internal Medicine

## 2011-09-23 DIAGNOSIS — N63 Unspecified lump in unspecified breast: Secondary | ICD-10-CM

## 2011-10-09 ENCOUNTER — Other Ambulatory Visit: Payer: Self-pay | Admitting: Otolaryngology

## 2011-10-09 DIAGNOSIS — H9319 Tinnitus, unspecified ear: Secondary | ICD-10-CM

## 2011-10-20 ENCOUNTER — Ambulatory Visit
Admission: RE | Admit: 2011-10-20 | Discharge: 2011-10-20 | Disposition: A | Discharge status: Home / Self Care | Payer: BC Managed Care – PPO | Source: Ambulatory Visit | Attending: Otolaryngology | Admitting: Otolaryngology

## 2011-10-20 DIAGNOSIS — H9319 Tinnitus, unspecified ear: Secondary | ICD-10-CM

## 2012-03-07 ENCOUNTER — Encounter: Payer: Self-pay | Admitting: Cardiovascular Disease

## 2012-03-07 ENCOUNTER — Ambulatory Visit (INDEPENDENT_AMBULATORY_CARE_PROVIDER_SITE_OTHER): Payer: BC Managed Care – PPO | Admitting: Cardiovascular Disease

## 2012-03-07 ENCOUNTER — Other Ambulatory Visit: Payer: Self-pay | Admitting: *Deleted

## 2012-03-07 VITALS — BP 144/88 | HR 65 | Ht 60.0 in | Wt 132.1 lb

## 2012-03-07 DIAGNOSIS — I10 Essential (primary) hypertension: Secondary | ICD-10-CM | POA: Insufficient documentation

## 2012-03-07 MED ORDER — POTASSIUM CHLORIDE CRYS ER 10 MEQ PO TBCR: 10.0000 meq | EXTENDED_RELEASE_TABLET | Freq: Every day | ORAL | Status: DC

## 2012-03-07 MED ORDER — HYDROCHLOROTHIAZIDE 25 MG PO TABS: 25.0000 mg | ORAL_TABLET | Freq: Every day | ORAL | Status: DC

## 2012-03-07 NOTE — Patient Instructions (Addendum)
Your physician has recommended you make the following change in your medication:   START HCTZ 25 MG MORNING START K DUR 10 MG MORNING  Your physician recommends that you schedule a follow-up appointment in: 1 MONTH  Your physician recommends that you return for lab work in: 1 MONTH

## 2012-03-07 NOTE — Progress Notes (Signed)
     Gail Bailey Date of Birth  1960-02-29       [facility]    [place] [address]  [address] [place], [place]  [address]   [place], [place]  [address] [phone]     [phone]   Fax  [phone]    Fax [phone]  Problem List: 1. HTN  History of Present Illness:  Gail Bailey is a 52 yo who I have known for years.  She has been under lots of stress recently.  She has been very fatigued.   She is sleeping well.  She's been on Verapamil  for years for headaches. She recently had lisinopril added to her medical regimen. The dose was increased recently up to 40 mg a day.  She may be eating a bit more salty foods recently.  She has cut out her soft drinks.     No current outpatient prescriptions on file prior to visit.   No current facility-administered medications on file prior to visit.    No Known Allergies  Past Medical History  Diagnosis Date  . Asthma   . Seasonal allergies   . Irregular periods/menstrual cycles   . Torn meniscus     Past Surgical History  Procedure Laterality Date  . Knee surgery    . Cesarean section    . Tubes in ears  10/12  . Tonsillectomy      History  Smoking status  . Never Smoker   Smokeless tobacco  . Never Used    History  Alcohol Use  . Yes    No family history on file.  Reviw of Systems:  Reviewed in the HPI.  All other systems are negative.  Physical Exam: Blood pressure 144/88, pulse 65, height 5' (1.524 m), weight 132 lb 1.9 oz (59.929 kg). General: Well developed, well nourished, in no acute distress.  Head: Normocephalic, atraumatic, sclera non-icteric, mucus membranes are moist,   Neck: Supple. Carotids are 2 + without bruits. No JVD  Lungs: Clear   Heart: RR, normal jS1, S2  Abdomen: Soft, non-tender, non-distended with normal bowel sounds.  I was able to palpitate her abdominal aorta.   Msk:  Strength and tone are normal   Extremities: No clubbing or  cyanosis. No edema.  Distal pedal pulses are 2+ and equal    Neuro: CN II - XII intact.  Alert and oriented X 3.   Psych:  Normal   ECG: Feb. 24, 2014:  NSR with sinus arrhythmia,  R axis deviation,  NS IVCD with associated ST changes  Assessment / Plan:

## 2012-03-07 NOTE — Assessment & Plan Note (Signed)
Tyshae presents today with mild hypertension. She has been under lots of stress recently she's had 5 friends die this year.  She has been recording her blood pressure at home. She has frequently elevated blood pressures but she has occasional readings that are in the normal range.  She is fairly careful about salt but states that she has been eating a little bit more salt than usual. She has not been able to exercise much because of knee surgery last year.  We'll add hydrochlorothiazide 25 mg tablets. We will have her start taking one half tablet a day. We'll also start her on potassium chloride 10 mg a day. She'll continue to monitor her blood pressure readings. I will see her again in one month for office visit and basic metabolic profile.  We discussed possibly discontinuing the verapamil and placing her on diltiazem.  Alternatively, we may be able to use some carvedilol or Bystolic.

## 2012-03-24 ENCOUNTER — Telehealth: Payer: Self-pay | Admitting: Cardiovascular Disease

## 2012-03-24 NOTE — Telephone Encounter (Signed)
Pt taking hctz 12.5 mg daily with K+, states she is feeling dizzy at times, reviewed bp reading and informed her they were in good range and to get her pulse next time, told her to continue to hydrate, and to call with further sx, pt agreed to plan.

## 2012-03-24 NOTE — Telephone Encounter (Signed)
New problem      C/O low blood pressure range from  109/68. 118/78 . Should medication be adjusted . Very groggy, headache,    Blood pressure reading  120/92 - two days .

## 2012-04-08 ENCOUNTER — Ambulatory Visit (INDEPENDENT_AMBULATORY_CARE_PROVIDER_SITE_OTHER): Payer: BC Managed Care – PPO | Admitting: Cardiovascular Disease

## 2012-04-08 ENCOUNTER — Encounter: Payer: Self-pay | Admitting: Cardiovascular Disease

## 2012-04-08 ENCOUNTER — Other Ambulatory Visit (INDEPENDENT_AMBULATORY_CARE_PROVIDER_SITE_OTHER): Payer: BC Managed Care – PPO

## 2012-04-08 VITALS — BP 104/74 | HR 70 | Ht 60.0 in | Wt 132.0 lb

## 2012-04-08 DIAGNOSIS — I10 Essential (primary) hypertension: Secondary | ICD-10-CM

## 2012-04-08 LAB — BASIC METABOLIC PANEL
CO2: 26 mEq/L (ref 19–32)
Chloride: 102 mEq/L (ref 96–112)
Sodium: 136 mEq/L (ref 135–145)

## 2012-04-08 MED ORDER — VERAPAMIL HCL ER 180 MG PO TBCR: 90.0000 mg | EXTENDED_RELEASE_TABLET | Freq: Every day | ORAL | Status: DC

## 2012-04-08 MED ORDER — LISINOPRIL 40 MG PO TABS: 20.0000 mg | ORAL_TABLET | Freq: Two times a day (BID) | ORAL | Status: DC

## 2012-04-08 NOTE — Patient Instructions (Addendum)
Your physician wants you to follow-up in: 3 months You will receive a reminder letter in the mail two months in advance. If you don't receive a letter, please call our office to schedule the follow-up appointment.  Your physician recommends that you return for lab work in: 3 months / bmet   Your physician has recommended you make the following change in your medication:   STOP HCTZ STOP POTASSIUM/ KDUR

## 2012-04-08 NOTE — Progress Notes (Signed)
     Leonie Man Date of Birth  07/04/1960       [facility]    [place] [address]  [address] [place], [place]  [address]   [place], [place]  [address] [phone]     [phone]   Fax  [phone]    Fax [phone]  Problem List: 1. HTN  History of Present Illness:  Lynore is a 52 yo who I have known for years.  She has been under lots of stress recently.  She has been very fatigued.   She is sleeping well.  She's been on Verapamil  for years for headaches. She recently had lisinopril added to her medical regimen. The dose was increased recently up to 40 mg a day.  She may be eating a bit more salty foods recently.  She has cut out her soft drinks.    April 08, 2012: Vaida is doing OK but is fatigued after starting the HCTZ and potassium.    Current Outpatient Prescriptions on File Prior to Visit  Medication Sig Dispense Refill  . acyclovir ointment (ZOVIRAX) 5 % as needed.       Marland Kitchen lisinopril (PRINIVIL,ZESTRIL) 40 MG tablet Take 20 mg by mouth 2 (two) times daily.       . potassium chloride (K-DUR,KLOR-CON) 10 MEQ tablet Take 1 tablet (10 mEq total) by mouth daily.  30 tablet  6  . VENTOLIN HFA 108 (90 BASE) MCG/ACT inhaler as needed.       . verapamil (CALAN-SR) 180 MG CR tablet Take 90 mg by mouth at bedtime.        No current facility-administered medications on file prior to visit.  HCTZ 12.5 mg a day.  No Known Allergies  Past Medical History  Diagnosis Date  . Asthma   . Seasonal allergies   . Irregular periods/menstrual cycles   . Torn meniscus     Past Surgical History  Procedure Laterality Date  . Knee surgery    . Cesarean section    . Tubes in ears  10/12  . Tonsillectomy      History  Smoking status  . Never Smoker   Smokeless tobacco  . Never Used    History  Alcohol Use  . Yes    No family history on file.  Reviw of Systems:  Reviewed in the HPI.  All other systems are  negative.  Physical Exam: Blood pressure 104/74, pulse 70, height 5' (1.524 m), weight 132 lb (59.875 kg), SpO2 98.00%. General: Well developed, well nourished, in no acute distress.  Head: Normocephalic, atraumatic, sclera non-icteric, mucus membranes are moist,   Neck: Supple. Carotids are 2 + without bruits. No JVD  Lungs: Clear   Heart: RR, normal jS1, S2  Abdomen: Soft, non-tender, non-distended with normal bowel sounds.  I was able to palpitate her abdominal aorta.   Msk:  Strength and tone are normal   Extremities: No clubbing or cyanosis. No edema.  Distal pedal pulses are 2+ and equal    Neuro: CN II - XII intact.  Alert and oriented X 3.   Psych:  Normal   ECG:  Assessment / Plan:

## 2012-04-08 NOTE — Assessment & Plan Note (Signed)
Gail Bailey's  blood pressure is improved but she's having lots of fatigue and malaise. I suspect this is because of the HCTZ therapy.  We'll DC the HCTZ and potassium at this time. She will continue to measure her blood pressure on a regular basis. She will call me if her blood pressure increases. I'll see her again in 3 months for followup visit. We'll check a basic profile at that time.

## 2012-04-18 ENCOUNTER — Other Ambulatory Visit (HOSPITAL_COMMUNITY): Payer: Self-pay | Admitting: Internal Medicine

## 2012-04-18 ENCOUNTER — Encounter: Payer: Self-pay | Admitting: Gastroenterology

## 2012-04-18 DIAGNOSIS — Z1231 Encounter for screening mammogram for malignant neoplasm of breast: Secondary | ICD-10-CM

## 2012-04-18 DIAGNOSIS — M810 Age-related osteoporosis without current pathological fracture: Secondary | ICD-10-CM

## 2012-05-02 ENCOUNTER — Other Ambulatory Visit: Payer: Self-pay | Admitting: Dermatology

## 2012-05-03 ENCOUNTER — Ambulatory Visit (HOSPITAL_COMMUNITY)
Admission: RE | Admit: 2012-05-03 | Discharge: 2012-05-03 | Disposition: A | Discharge status: Home / Self Care | Payer: BC Managed Care – PPO | Source: Ambulatory Visit | Attending: Internal Medicine | Admitting: Internal Medicine

## 2012-05-03 DIAGNOSIS — M949 Disorder of cartilage, unspecified: Secondary | ICD-10-CM | POA: Insufficient documentation

## 2012-05-03 DIAGNOSIS — Z780 Asymptomatic menopausal state: Secondary | ICD-10-CM | POA: Insufficient documentation

## 2012-05-03 DIAGNOSIS — M810 Age-related osteoporosis without current pathological fracture: Secondary | ICD-10-CM

## 2012-05-03 DIAGNOSIS — M899 Disorder of bone, unspecified: Secondary | ICD-10-CM | POA: Insufficient documentation

## 2012-05-03 DIAGNOSIS — Z1231 Encounter for screening mammogram for malignant neoplasm of breast: Secondary | ICD-10-CM | POA: Insufficient documentation

## 2012-05-03 DIAGNOSIS — Z13820 Encounter for screening for osteoporosis: Secondary | ICD-10-CM | POA: Insufficient documentation

## 2012-05-04 ENCOUNTER — Other Ambulatory Visit: Payer: Self-pay | Admitting: Internal Medicine

## 2012-05-04 DIAGNOSIS — R928 Other abnormal and inconclusive findings on diagnostic imaging of breast: Secondary | ICD-10-CM

## 2012-05-16 ENCOUNTER — Ambulatory Visit
Admission: RE | Admit: 2012-05-16 | Discharge: 2012-05-16 | Disposition: A | Discharge status: Home / Self Care | Payer: BC Managed Care – PPO | Source: Ambulatory Visit | Attending: Internal Medicine | Admitting: Internal Medicine

## 2012-05-16 DIAGNOSIS — R928 Other abnormal and inconclusive findings on diagnostic imaging of breast: Secondary | ICD-10-CM

## 2012-05-30 ENCOUNTER — Encounter: Payer: BC Managed Care – PPO | Admitting: Gastroenterology

## 2012-06-17 ENCOUNTER — Ambulatory Visit (AMBULATORY_SURGERY_CENTER): Payer: BC Managed Care – PPO | Admitting: *Deleted

## 2012-06-17 ENCOUNTER — Encounter: Payer: Self-pay | Admitting: Gastroenterology

## 2012-06-17 VITALS — Ht 60.0 in | Wt 134.0 lb

## 2012-06-17 DIAGNOSIS — Z1211 Encounter for screening for malignant neoplasm of colon: Secondary | ICD-10-CM

## 2012-06-17 MED ORDER — NA SULFATE-K SULFATE-MG SULF 17.5-3.13-1.6 GM/177ML PO SOLN: ORAL | Status: DC

## 2012-07-05 ENCOUNTER — Encounter: Payer: BC Managed Care – PPO | Admitting: Gastroenterology

## 2012-07-05 ENCOUNTER — Encounter: Payer: Self-pay | Admitting: Cardiovascular Disease

## 2012-07-05 ENCOUNTER — Ambulatory Visit (INDEPENDENT_AMBULATORY_CARE_PROVIDER_SITE_OTHER): Payer: BC Managed Care – PPO | Admitting: Cardiovascular Disease

## 2012-07-05 ENCOUNTER — Other Ambulatory Visit (INDEPENDENT_AMBULATORY_CARE_PROVIDER_SITE_OTHER): Payer: BC Managed Care – PPO

## 2012-07-05 VITALS — BP 140/82 | HR 64 | Ht 60.0 in | Wt 129.8 lb

## 2012-07-05 DIAGNOSIS — I10 Essential (primary) hypertension: Secondary | ICD-10-CM

## 2012-07-05 LAB — BASIC METABOLIC PANEL
BUN: 12 mg/dL (ref 6–23)
Chloride: 103 mEq/L (ref 96–112)
Creatinine, Ser: 0.8 mg/dL (ref 0.4–1.2)
Glucose, Bld: 106 mg/dL — ABNORMAL HIGH (ref 70–99)

## 2012-07-05 NOTE — Progress Notes (Signed)
Gail Bailey Date of Birth  Feb 23, 1960       [facility]    [place] [address]  [address] [place], [place]  [address]   [place], [place]  [address] [phone]     [phone]   Fax  [phone]    Fax [phone]  Problem List: 1. HTN  History of Present Illness:  Gail Bailey is a 52 yo who I have known for years.  She has been under lots of stress recently.  She has been very fatigued.   She is sleeping well.  She's been on Verapamil  for years for headaches. She recently had lisinopril added to her medical regimen. The dose was increased recently up to 40 mg a day.  She may be eating a bit more salty foods recently.  She has cut out her soft drinks.    April 08, 2012: Gail Bailey is doing OK but is fatigued after starting the HCTZ and potassium.   July 05, 2012:  Gail Bailey is doing well.  We stopped the HCTZ and K due to fatigue - her fatigue is much better.  Her BP at home has been well controlled.  She continues to watch her salt and is exercising daily.      Current Outpatient Prescriptions on File Prior to Visit  Medication Sig Dispense Refill  . acyclovir ointment (ZOVIRAX) 5 % as needed.       Marland Kitchen lisinopril (PRINIVIL,ZESTRIL) 40 MG tablet Take 0.5 tablets (20 mg total) by mouth 2 (two) times daily.  180 tablet  3  . Na Sulfate-K Sulfate-Mg Sulf SOLN Take as directed.  354 mL  0  . VENTOLIN HFA 108 (90 BASE) MCG/ACT inhaler as needed.       . verapamil (CALAN-SR) 180 MG CR tablet Take 0.5 tablets (90 mg total) by mouth at bedtime.  90 tablet  3   No current facility-administered medications on file prior to visit.  HCTZ 12.5 mg a day.  No Known Allergies  Past Medical History  Diagnosis Date  . Asthma   . Seasonal allergies   . Irregular periods/menstrual cycles   . Torn meniscus   . Hypertension     Past Surgical History  Procedure Laterality Date  . Knee surgery      partial knee replacement right  . Cesarean  section      x2  . Tubes in ears  10/12  . Tonsillectomy    . Breast reduction surgery      History  Smoking status  . Never Smoker   Smokeless tobacco  . Never Used    History  Alcohol Use  . 3.0 oz/week  . 5 Glasses of wine per week    Family History  Problem Relation Age of Onset  . Colon cancer Neg Hx     Reviw of Systems:  Reviewed in the HPI.  All other systems are negative.  Physical Exam: Blood pressure 162/94, pulse 64, height 5' (1.524 m), weight 129 lb 12.8 oz (58.877 kg), SpO2 99.00%. General: Well developed, well nourished, in no acute distress.  Head: Normocephalic, atraumatic, sclera non-icteric, mucus membranes are moist,   Neck: Supple. Carotids are 2 + without bruits. No JVD  Lungs: Clear   Heart: RR, normal jS1, S2  Abdomen: Soft, non-tender, non-distended with normal bowel sounds.  I was able to palpitate her abdominal aorta.   Msk:  Strength and tone are normal   Extremities:  No clubbing or cyanosis. No edema.  Distal pedal pulses are 2+ and equal    Neuro: CN II - XII intact.  Alert and oriented X 3.   Psych:  Normal   ECG:  Assessment / Plan:

## 2012-07-05 NOTE — Patient Instructions (Addendum)
Continue same medications   Your physician wants you to follow-up in: 6 months with lab Bmet You will receive a reminder letter in the mail two months in advance. If you don't receive a letter, please call our office to schedule the follow-up appointment.

## 2012-07-05 NOTE — Assessment & Plan Note (Signed)
Gail Bailey's BP has been well controlled at home.  It is a bit higher today.   She is watching her salt and is exercising regularly.  Will continue current meds.  Check BMP today.   I will see her again in 6 months for OV and BMP .

## 2012-07-06 ENCOUNTER — Other Ambulatory Visit: Payer: BC Managed Care – PPO

## 2012-07-06 ENCOUNTER — Ambulatory Visit: Payer: BC Managed Care – PPO | Admitting: Cardiovascular Disease

## 2012-07-07 ENCOUNTER — Encounter: Payer: BC Managed Care – PPO | Admitting: Gastroenterology

## 2012-08-16 ENCOUNTER — Telehealth: Payer: Self-pay | Admitting: Gastroenterology

## 2012-08-16 NOTE — Telephone Encounter (Signed)
Called pt and left message to call our office to schedule another pre-visit prior to her colonoscopy on 08/23/12.Cherylann Ratel

## 2012-08-17 NOTE — Telephone Encounter (Signed)
Pt sent new updated instructions for her colon.

## 2012-08-23 ENCOUNTER — Encounter: Payer: Self-pay | Admitting: Gastroenterology

## 2012-08-23 ENCOUNTER — Ambulatory Visit (AMBULATORY_SURGERY_CENTER): Payer: BC Managed Care – PPO | Admitting: Gastroenterology

## 2012-08-23 VITALS — BP 131/76 | HR 61 | Temp 97.6°F | Resp 16 | Ht 60.0 in | Wt 134.0 lb

## 2012-08-23 DIAGNOSIS — D126 Benign neoplasm of colon, unspecified: Secondary | ICD-10-CM

## 2012-08-23 DIAGNOSIS — Z1211 Encounter for screening for malignant neoplasm of colon: Secondary | ICD-10-CM

## 2012-08-23 LAB — HM COLONOSCOPY

## 2012-08-23 MED ORDER — SODIUM CHLORIDE 0.9 % IV SOLN: 500.0000 mL | INTRAVENOUS | Status: DC

## 2012-08-23 NOTE — Progress Notes (Addendum)
Patient did not have preoperative order for IV antibiotic SSI prophylaxis. (G8918)  Patient did not experience any of the following events: a burn prior to discharge; a fall within the facility; wrong site/side/patient/procedure/implant event; or a hospital transfer or hospital admission upon discharge from the facility. (G8907)  

## 2012-08-23 NOTE — Progress Notes (Signed)
Pt stable to RR 

## 2012-08-23 NOTE — Progress Notes (Signed)
Called to room to assist during endoscopic procedure.  Patient ID and intended procedure confirmed with present staff. Received instructions for my participation in the procedure from the performing physician.  

## 2012-08-23 NOTE — Patient Instructions (Addendum)
YOU HAD AN ENDOSCOPIC PROCEDURE TODAY AT THE [facility]: Refer to the procedure report that was given to you for any specific questions about what was found during the examination.  If the procedure report does not answer your questions, please call your gastroenterologist to clarify.  If you requested that your care partner not be given the details of your procedure findings, then the procedure report has been included in a sealed envelope for you to review at your convenience later.  YOU SHOULD EXPECT: Some feelings of bloating in the abdomen. Passage of more gas than usual.  Walking can help get rid of the air that was put into your GI tract during the procedure and reduce the bloating. If you had a lower endoscopy (such as a colonoscopy or flexible sigmoidoscopy) you may notice spotting of blood in your stool or on the toilet paper. If you underwent a bowel prep for your procedure, then you may not have a normal bowel movement for a few days.  DIET: Your first meal following the procedure should be a light meal and then it is ok to progress to your normal diet.  A half-sandwich or bowl of soup is an example of a good first meal.  Heavy or fried foods are harder to digest and may make you feel nauseous or bloated.  Likewise meals heavy in dairy and vegetables can cause extra gas to form and this can also increase the bloating.  Drink plenty of fluids but you should avoid alcoholic beverages for 24 hours.  Try to increase the fiber in your diet.  Benefiber is very helpful, and is over the counter.  ACTIVITY: Your care partner should take you home directly after the procedure.  You should plan to take it easy, moving slowly for the rest of the day.  You can resume normal activity the day after the procedure however you should NOT DRIVE or use heavy machinery for 24 hours (because of the sedation medicines used during the test).    SYMPTOMS TO REPORT IMMEDIATELY: A gastroenterologist can be  reached at any hour.  During normal business hours, 8:30 AM to 5:00 PM Monday through Friday, call [phone].  After hours and on weekends, please call the GI answering service at [phone] who will take a message and have the physician on call contact you.   Following lower endoscopy (colonoscopy or flexible sigmoidoscopy):  Excessive amounts of blood in the stool  Significant tenderness or worsening of abdominal pains  Swelling of the abdomen that is new, acute  Fever of 100F or higher  FOLLOW UP: If any biopsies were taken you will be contacted by phone or by letter within the next 1-3 weeks.  Call your gastroenterologist if you have not heard about the biopsies in 3 weeks.  Our staff will call the home number listed on your records the next business day following your procedure to check on you and address any questions or concerns that you may have at that time regarding the information given to you following your procedure. This is a courtesy call and so if there is no answer at the home number and we have not heard from you through the emergency physician on call, we will assume that you have returned to your regular daily activities without incident.  SIGNATURES/CONFIDENTIALITY: You and/or your care partner have signed paperwork which will be entered into your electronic medical record.  These signatures attest to the fact that that the information above  on your After Visit Summary has been reviewed and is understood.  Full responsibility of the confidentiality of this discharge information lies with you and/or your care-partner.

## 2012-08-23 NOTE — Progress Notes (Signed)
No egg or soy allergy. ewm No probs w/ past sedation. ewn

## 2012-08-23 NOTE — Op Note (Signed)
[facility] [address] [place], [address]   COLONOSCOPY PROCEDURE REPORT  PATIENT: Gail Bailey, Gail Bailey  MR#: [id number] BIRTHDATE: 12/11/1960 , 51  yrs. old GENDER: Female ENDOSCOPIST: Louis Meckel, MD REFERRED JO:ACZYSAY Oneta Rack, M.D. PROCEDURE DATE:  08/23/2012 PROCEDURE:   Colonoscopy with cold biopsy polypectomy First Screening Colonoscopy - Avg.  risk and is 50 yrs.  old or older Yes.  Prior Negative Screening - Now for repeat screening. N/A  History of Adenoma - Now for follow-up colonoscopy & has been > or = to 3 yrs.  N/A  Polyps Removed Today? Yes. ASA CLASS:   Class I INDICATIONS:average risk screening. MEDICATIONS: MAC sedation, administered by CRNA and propofol (Diprivan) 200mg  IV  DESCRIPTION OF PROCEDURE:   After the risks benefits and alternatives of the procedure were thoroughly explained, informed consent was obtained.  A digital rectal exam revealed no abnormalities of the rectum.   The LB [id number]  endoscope was introduced through the anus and advanced to the cecum, which was identified by both the appendix and ileocecal valve. No adverse events experienced.   The quality of the prep was excellent using Suprep  The instrument was then slowly withdrawn as the colon was fully examined.      COLON FINDINGS: A diminutive sessile polyp was found in the rectum. A polypectomy was performed with cold forceps.   There was mild scattered diverticulosis noted in the sigmoid colon, descending colon, and transverse colon.   The colon mucosa was otherwise normal.  Retroflexed views revealed no abnormalities. The time to cecum=2 minutes 28 seconds.  Withdrawal time=7 minutes 22 seconds. The scope was withdrawn and the procedure completed. COMPLICATIONS: There were no complications.  ENDOSCOPIC IMPRESSION: 1.   Diminutive sessile polyp was found in the rectum; polypectomy was performed with cold forceps 2.   There was mild diverticulosis  noted in the sigmoid colon, descending colon, and transverse colon 3.   The colon mucosa was otherwise normal  RECOMMENDATIONS: If the polyp(s) removed today are proven to be adenomatous (pre-cancerous) polyps, you will need a repeat colonoscopy in 5 years.  Otherwise you should continue to follow colorectal cancer screening guidelines for "routine risk" patients with colonoscopy in 10 years.  You will receive a letter within 1-2 weeks with the results of your biopsy as well as final recommendations.  Please call my office if you have not received a letter after 3 weeks.   eSigned:  Louis Meckel, MD 08/23/2012 8:35 AM   cc:   PATIENT NAME:  Gail Bailey, Gail Bailey MR#: [id number]

## 2012-08-24 ENCOUNTER — Telehealth: Payer: Self-pay | Admitting: *Deleted

## 2012-08-24 NOTE — Telephone Encounter (Signed)
Left message on number given in admitting to return call if problems, questions, or concerns. ewm 

## 2012-09-08 ENCOUNTER — Encounter: Payer: Self-pay | Admitting: Gastroenterology

## 2012-12-13 ENCOUNTER — Encounter: Payer: Self-pay | Admitting: Cardiovascular Disease

## 2012-12-13 ENCOUNTER — Ambulatory Visit (INDEPENDENT_AMBULATORY_CARE_PROVIDER_SITE_OTHER): Payer: BC Managed Care – PPO | Admitting: Cardiovascular Disease

## 2012-12-13 VITALS — BP 142/90 | HR 80 | Ht 60.0 in | Wt 141.8 lb

## 2012-12-13 DIAGNOSIS — I10 Essential (primary) hypertension: Secondary | ICD-10-CM

## 2012-12-13 MED ORDER — VERAPAMIL HCL ER 180 MG PO TBCR: 90.0000 mg | EXTENDED_RELEASE_TABLET | Freq: Two times a day (BID) | ORAL | Status: DC

## 2012-12-13 NOTE — Patient Instructions (Signed)
Your physician recommends that you return for lab work in: TODAY BMET TSH  Your physician has recommended you make the following change in your medication:  INCREASE VERAPAMIL 90 MG TWICE DAILY  Your physician recommends that you schedule a follow-up appointment in: 3 MONTHS  WITH BMET

## 2012-12-13 NOTE — Assessment & Plan Note (Signed)
Since his blood pressure has been gradually increasing of the past month or so. She's gained a little bit of weight which may be contributing to this problem. We'll have her start back exercising on a regular basis.  We will increase her verapamil to 90 mg twice a day. I'll see her again in several months for followup office visit.

## 2012-12-13 NOTE — Progress Notes (Signed)
Gail Bailey Date of Birth  1960-06-25       [facility]    [place] [address], [address]  [address] [place], [place]  [address]   [place], [place]  [address] [phone]     [phone]   Fax  [phone]    Fax [phone]  Problem List: 1. HTN  History of Present Illness:  Gail Bailey is a 52 yo who I have known for years.  She has been under lots of stress recently.  She has been very fatigued.   She is sleeping well.  She's been on Verapamil  for years for headaches. She recently had lisinopril added to her medical regimen. The dose was increased recently up to 40 mg a day.  She may be eating a bit more salty foods recently.  She has cut out her soft drinks.    April 08, 2012: Gail Bailey is doing OK but is fatigued after starting the HCTZ and potassium.   July 05, 2012:  Gail Bailey is doing well.  We stopped the HCTZ and K due to fatigue - her fatigue is much better.  Her BP at home has been well controlled.  She continues to watch her salt and is exercising daily.    Dec. 2, 2014:  Gail Bailey presents today for worsening CP.  She is not walking as much as she used to .  Typical BP is 150-160 systolic .  She is on prednisone for sinus infection.  She is not sleeping well. This past Sunday, she had some numbness in her left hand.      Current Outpatient Prescriptions on File Prior to Visit  Medication Sig Dispense Refill  . acyclovir ointment (ZOVIRAX) 5 % Apply 1 application topically every 3 (three) hours.       Marland Kitchen EPIDUO 0.1-2.5 % gel Apply 1 application topically as needed.       Marland Kitchen lisinopril (PRINIVIL,ZESTRIL) 40 MG tablet Take 0.5 tablets (20 mg total) by mouth 2 (two) times daily.  180 tablet  3  . VENTOLIN HFA 108 (90 BASE) MCG/ACT inhaler as needed.       . verapamil (CALAN-SR) 180 MG CR tablet Take 0.5 tablets (90 mg total) by mouth at bedtime.  90 tablet  3   No current facility-administered medications on file prior to visit.  HCTZ  12.5 mg a day.  No Known Allergies  Past Medical History  Diagnosis Date  . Asthma   . Seasonal allergies   . Irregular periods/menstrual cycles   . Torn meniscus   . Hypertension     Past Surgical History  Procedure Laterality Date  . Knee surgery      partial knee replacement right  . Cesarean section      x2  . Tubes in ears  10/12  . Tonsillectomy    . Breast reduction surgery      History  Smoking status  . Never Smoker   Smokeless tobacco  . Never Used    History  Alcohol Use  . 3.0 oz/week  . 5 Glasses of wine per week    Family History  Problem Relation Age of Onset  . Colon cancer Neg Hx     Reviw of Systems:  Reviewed in the HPI.  All other systems are negative.  Physical Exam: Blood pressure 140/90, pulse 80, height 5' (1.524 m), weight 141 lb 12.8 oz (64.32 kg). General: Well developed, well nourished, in no acute  distress.  Head: Normocephalic, atraumatic, sclera non-icteric, mucus membranes are moist,   Neck: Supple. Carotids are 2 + without bruits. No JVD  Lungs: Clear   Heart: RR, normal jS1, S2  Abdomen: Soft, non-tender, non-distended with normal bowel sounds.  I was able to palpitate her abdominal aorta.   Msk:  Strength and tone are normal   Extremities: No clubbing or cyanosis. No edema.  Distal pedal pulses are 2+ and equal    Neuro: CN II - XII intact.  Alert and oriented X 3.   Psych:  Normal   ECG:  Assessment / Plan:

## 2013-01-02 ENCOUNTER — Encounter: Payer: Self-pay | Admitting: Emergency Medicine

## 2013-01-02 ENCOUNTER — Ambulatory Visit (INDEPENDENT_AMBULATORY_CARE_PROVIDER_SITE_OTHER): Payer: BC Managed Care – PPO | Admitting: Emergency Medicine

## 2013-01-02 VITALS — BP 138/72 | HR 68 | Temp 98.2°F | Resp 18 | Ht 60.75 in | Wt 142.0 lb

## 2013-01-02 DIAGNOSIS — R7309 Other abnormal glucose: Secondary | ICD-10-CM

## 2013-01-02 DIAGNOSIS — R5381 Other malaise: Secondary | ICD-10-CM

## 2013-01-02 DIAGNOSIS — J329 Chronic sinusitis, unspecified: Secondary | ICD-10-CM

## 2013-01-02 DIAGNOSIS — I10 Essential (primary) hypertension: Secondary | ICD-10-CM

## 2013-01-02 DIAGNOSIS — E559 Vitamin D deficiency, unspecified: Secondary | ICD-10-CM

## 2013-01-02 DIAGNOSIS — E782 Mixed hyperlipidemia: Secondary | ICD-10-CM

## 2013-01-02 LAB — CBC WITH DIFFERENTIAL/PLATELET
HCT: 41.6 % (ref 36.0–46.0)
Hemoglobin: 14.2 g/dL (ref 12.0–15.0)
Lymphocytes Relative: 22 % (ref 12–46)
Lymphs Abs: 3 10*3/uL (ref 0.7–4.0)
Monocytes Absolute: 0.9 10*3/uL (ref 0.1–1.0)
Monocytes Relative: 7 % (ref 3–12)
Neutro Abs: 9.2 10*3/uL — ABNORMAL HIGH (ref 1.7–7.7)
Neutrophils Relative %: 70 % (ref 43–77)
RBC: 4.71 MIL/uL (ref 3.87–5.11)
WBC: 13.2 10*3/uL — ABNORMAL HIGH (ref 4.0–10.5)

## 2013-01-02 LAB — HEPATIC FUNCTION PANEL
AST: 14 U/L (ref 0–37)
Albumin: 4.2 g/dL (ref 3.5–5.2)
Total Bilirubin: 0.3 mg/dL (ref 0.3–1.2)
Total Protein: 6.6 g/dL (ref 6.0–8.3)

## 2013-01-02 LAB — HEMOGLOBIN A1C
Hgb A1c MFr Bld: 5.6 % (ref <5.7)
Mean Plasma Glucose: 114 mg/dL (ref <117)

## 2013-01-02 LAB — TSH: TSH: 0.861 u[IU]/mL (ref 0.350–4.500)

## 2013-01-02 LAB — LIPID PANEL
Cholesterol: 251 mg/dL — ABNORMAL HIGH (ref 0–200)
Total CHOL/HDL Ratio: 6 Ratio

## 2013-01-02 LAB — BASIC METABOLIC PANEL WITH GFR
BUN: 10 mg/dL (ref 6–23)
CO2: 26 mEq/L (ref 19–32)
Chloride: 104 mEq/L (ref 96–112)
GFR, Est African American: >89 mL/min
Glucose, Bld: 94 mg/dL (ref 70–99)
Potassium: 3.7 mEq/L (ref 3.5–5.3)

## 2013-01-02 MED ORDER — LEVOFLOXACIN 500 MG PO TABS: 500.0000 mg | ORAL_TABLET | Freq: Every day | ORAL | Status: AC

## 2013-01-02 NOTE — Progress Notes (Addendum)
Subjective:    [name], female    DOB: 1960/01/23, 52 y.o.   MRN: [id number]  HPI Comments: 52 yo female presents for 3 month F/U for HTN, Cholesterol, Pre-Dm, D. Deficient. She has not been taking her Vit D AD. BP goes up with prednisone.  Dr Elease Hashimoto recently upped HTN RX with elevations which has helped before Prednisone for recent infection. Exercising more. Eating healthy. T 198 TG 137 H 48 LDL 123 A1C 5.9 TSH 1.1 D92  Recent Pred and Zpak for Sinus infection which helped some but feeling worse. Increase pressure and headache over last month. She notes color has changed with production when she gets any to come out. She has tried multiple OTC Allergy pills and mucinex with out relief.   Sinusitis Associated symptoms include coughing, ear pain, headaches and sinus pressure.  Otalgia  Associated symptoms include coughing and headaches.    Current Outpatient Prescriptions on File Prior to Visit  Medication Sig Dispense Refill  . lisinopril (PRINIVIL,ZESTRIL) 40 MG tablet Take 0.5 tablets (20 mg total) by mouth 2 (two) times daily.  180 tablet  3  . VENTOLIN HFA 108 (90 BASE) MCG/ACT inhaler as needed.       . verapamil (CALAN-SR) 180 MG CR tablet Take 0.5 tablets (90 mg total) by mouth 2 (two) times daily.  90 tablet  3  . acyclovir ointment (ZOVIRAX) 5 % Apply 1 application topically every 3 (three) hours.       Marland Kitchen EPIDUO 0.1-2.5 % gel Apply 1 application topically as needed.       Marland Kitchen estradiol (ESTRACE) 1 MG tablet Take 1 mg by mouth daily.      . medroxyPROGESTERone (PROVERA) 2.5 MG tablet Take 2.5 mg by mouth daily.       No current facility-administered medications on file prior to visit.    Review of patient's allergies indicates no known allergies.  Past Medical History  Diagnosis Date  . Asthma   . Seasonal allergies   . Irregular periods/menstrual cycles   . Torn meniscus   . Hypertension      Review of Systems  Constitutional: Positive for  fatigue.  HENT: Positive for ear pain, postnasal drip and sinus pressure.   Respiratory: Positive for cough.   Neurological: Positive for headaches.   BP 138/72  Pulse 68  Temp(Src) 98.2 F (36.8 C) (Temporal)  Resp 18  Ht 5' 0.75" (1.543 m)  Wt 142 lb (64.411 kg)  BMI 27.05 kg/m2     Objective:   Physical Exam  Nursing note and vitals reviewed. Constitutional: She is oriented to person, place, and time. She appears well-developed and well-nourished. No distress.  HENT:  Head: Normocephalic and atraumatic.  Right Ear: External ear normal.  Left Ear: External ear normal.  Nose: Nose normal.  Mouth/Throat: No oropharyngeal exudate.  Maxillary tenderness, right tympanostomy tube appears partially occluded with wax, both TMs cloudy mildly injected  Eyes: Conjunctivae and EOM are normal.  Neck: Normal range of motion. Neck supple. No JVD present. No thyromegaly present.  Cardiovascular: Normal rate, regular rhythm, normal heart sounds and intact distal pulses.   Pulmonary/Chest: Effort normal and breath sounds normal.  Abdominal: Soft. Bowel sounds are normal. She exhibits no distension. There is no tenderness.  Musculoskeletal: Normal range of motion. She exhibits no edema and no tenderness.  Lymphadenopathy:    She has no cervical adenopathy.  Neurological: She is alert and oriented to person, place, and time. No cranial  nerve deficit.  Skin: Skin is warm and dry. No rash noted. No erythema. No pallor.  Psychiatric: She has a normal mood and affect. Her behavior is normal. Judgment and thought content normal.          Assessment & Plan:  1.  3 month F/U for HTN, Cholesterol, Pre-Dm, D. Deficient. Needs healthy diet, cardio QD and obtain healthy weight. Check Labs, Check BP if >130/80 call office 2. Sinus infection- LEVAQUIN 500 MG 1 PO QD #10, Switch to Allegra AD, Continue mucinex OTC AD, increase H2o

## 2013-01-02 NOTE — Patient Instructions (Signed)
Sinusitis Sinusitis is redness, soreness, and puffiness (inflammation) of the air pockets in the bones of your face (sinuses). The redness, soreness, and puffiness can cause air and mucus to get trapped in your sinuses. This can allow germs to grow and cause an infection.  HOME CARE   Drink enough fluids to keep your pee (urine) clear or pale yellow.  Use a humidifier in your home.  Run a hot shower to create steam in the bathroom. Sit in the bathroom with the door closed. Breathe in the steam 3 4 times a day.  Put a warm, moist washcloth on your face 3 4 times a day, or as told by your doctor.  Use salt water sprays (saline sprays) to wet the thick fluid in your nose. This can help the sinuses drain.  Only take medicine as told by your doctor. GET HELP RIGHT AWAY IF:   Your pain gets worse.  You have very bad headaches.  You are sick to your stomach (nauseous).  You throw up (vomit).  You are very sleepy (drowsy) all the time.  Your face is puffy (swollen).  Your vision changes.  You have a stiff neck.  You have trouble breathing. MAKE SURE YOU:   Understand these instructions.  Will watch your condition.  Will get help right away if you are not doing well or get worse. Document Released: 06/17/2007 Document Revised: 09/23/2011 Document Reviewed: 08/04/2011 [employer] Patient Information 2014 [employer].  

## 2013-01-03 ENCOUNTER — Telehealth: Payer: Self-pay | Admitting: *Deleted

## 2013-01-03 NOTE — Telephone Encounter (Signed)
Spoke with patient about lab results.  Patient declines Pravastatin Rx at this time to help lower cholesterol.  Wants to try Red Yeast Rice Extract 2 pills BID and recheck in 6 weeks for a lab to recheck cholesterol.

## 2013-01-16 ENCOUNTER — Other Ambulatory Visit: Payer: Self-pay | Admitting: Internal Medicine

## 2013-01-16 MED ORDER — LISINOPRIL 40 MG PO TABS
20.0000 mg | ORAL_TABLET | Freq: Two times a day (BID) | ORAL | Status: DC
Start: 2013-01-16 — End: 2013-11-19

## 2013-01-23 ENCOUNTER — Other Ambulatory Visit: Payer: Self-pay | Admitting: Emergency Medicine

## 2013-01-23 MED ORDER — AMOXICILLIN 500 MG PO CAPS: 500.0000 mg | ORAL_CAPSULE | Freq: Three times a day (TID) | ORAL | Status: DC

## 2013-02-01 ENCOUNTER — Other Ambulatory Visit: Payer: Self-pay | Admitting: Obstetrics and Gynecology

## 2013-02-07 ENCOUNTER — Other Ambulatory Visit: Payer: Self-pay

## 2013-02-08 ENCOUNTER — Other Ambulatory Visit: Payer: BC Managed Care – PPO

## 2013-02-08 DIAGNOSIS — E782 Mixed hyperlipidemia: Secondary | ICD-10-CM

## 2013-02-08 LAB — HEPATIC FUNCTION PANEL
ALT: 19 U/L (ref 0–35)
AST: 17 U/L (ref 0–37)
Albumin: 4.3 g/dL (ref 3.5–5.2)
Alkaline Phosphatase: 64 U/L (ref 39–117)
Bilirubin, Direct: 0.1 mg/dL (ref 0.0–0.3)
Indirect Bilirubin: 0.4 mg/dL (ref 0.2–1.2)
Total Bilirubin: 0.5 mg/dL (ref 0.2–1.2)
Total Protein: 6.6 g/dL (ref 6.0–8.3)

## 2013-02-08 LAB — LIPID PANEL
Cholesterol: 180 mg/dL (ref 0–200)
HDL: 38 mg/dL — ABNORMAL LOW (ref >39)
LDL Cholesterol: 99 mg/dL (ref 0–99)
TRIGLYCERIDES: 217 mg/dL — AB (ref <150)
Total CHOL/HDL Ratio: 4.7 Ratio
VLDL: 43 mg/dL — AB (ref 0–40)

## 2013-03-13 ENCOUNTER — Encounter: Payer: Self-pay | Admitting: Emergency Medicine

## 2013-03-13 ENCOUNTER — Ambulatory Visit (INDEPENDENT_AMBULATORY_CARE_PROVIDER_SITE_OTHER): Payer: BC Managed Care – PPO | Admitting: Emergency Medicine

## 2013-03-13 VITALS — BP 104/62 | HR 86 | Temp 98.4°F | Resp 16 | Ht 61.0 in | Wt 134.0 lb

## 2013-03-13 DIAGNOSIS — E860 Dehydration: Secondary | ICD-10-CM

## 2013-03-13 DIAGNOSIS — R197 Diarrhea, unspecified: Secondary | ICD-10-CM

## 2013-03-13 LAB — BASIC METABOLIC PANEL WITH GFR
BUN: 9 mg/dL (ref 6–23)
CHLORIDE: 101 meq/L (ref 96–112)
CO2: 24 mEq/L (ref 19–32)
Calcium: 8.8 mg/dL (ref 8.4–10.5)
Creat: 0.9 mg/dL (ref 0.50–1.10)
GFR, EST NON AFRICAN AMERICAN: 74 mL/min
GFR, Est African American: 85 mL/min
Glucose, Bld: 104 mg/dL — ABNORMAL HIGH (ref 70–99)
Potassium: 3.9 mEq/L (ref 3.5–5.3)
Sodium: 137 mEq/L (ref 135–145)

## 2013-03-13 LAB — CBC WITH DIFFERENTIAL/PLATELET
Basophils Absolute: 0 10*3/uL (ref 0.0–0.1)
Basophils Relative: 0 % (ref 0–1)
Eosinophils Absolute: 0 10*3/uL (ref 0.0–0.7)
Eosinophils Relative: 0 % (ref 0–5)
HEMATOCRIT: 41.7 % (ref 36.0–46.0)
HEMOGLOBIN: 14.2 g/dL (ref 12.0–15.0)
LYMPHS PCT: 13 % (ref 12–46)
Lymphs Abs: 1.4 10*3/uL (ref 0.7–4.0)
MCH: 30 pg (ref 26.0–34.0)
MCHC: 34.1 g/dL (ref 30.0–36.0)
MCV: 88 fL (ref 78.0–100.0)
MONO ABS: 0.7 10*3/uL (ref 0.1–1.0)
MONOS PCT: 7 % (ref 3–12)
NEUTROS ABS: 8.3 10*3/uL — AB (ref 1.7–7.7)
Neutrophils Relative %: 80 % — ABNORMAL HIGH (ref 43–77)
Platelets: 342 10*3/uL (ref 150–400)
RBC: 4.74 MIL/uL (ref 3.87–5.11)
RDW: 13.9 % (ref 11.5–15.5)
WBC: 10.4 10*3/uL (ref 4.0–10.5)

## 2013-03-13 LAB — HEPATIC FUNCTION PANEL
ALBUMIN: 4 g/dL (ref 3.5–5.2)
ALT: 16 U/L (ref 0–35)
AST: 13 U/L (ref 0–37)
Alkaline Phosphatase: 77 U/L (ref 39–117)
BILIRUBIN INDIRECT: 0.4 mg/dL (ref 0.2–1.2)
Bilirubin, Direct: 0.1 mg/dL (ref 0.0–0.3)
TOTAL PROTEIN: 6.5 g/dL (ref 6.0–8.3)
Total Bilirubin: 0.5 mg/dL (ref 0.2–1.2)

## 2013-03-13 MED ORDER — CIPROFLOXACIN HCL 500 MG PO TABS: 500.0000 mg | ORAL_TABLET | Freq: Two times a day (BID) | ORAL | Status: AC

## 2013-03-13 NOTE — Patient Instructions (Signed)
Diarrhea Diarrhea is watery poop (stool). It can make you feel weak, tired, thirsty, or give you a dry mouth (signs of dehydration). Watery poop is a sign of another problem, most often an infection. It often lasts 2 3 days. It can last longer if it is a sign of something serious. Take care of yourself as told by your doctor. HOME CARE   Drink 1 cup (8 ounces) of fluid each time you have watery poop.  Do not drink the following fluids:  Those that contain simple sugars (fructose, glucose, galactose, lactose, sucrose, maltose).  Sports drinks.  Fruit juices.  Whole milk products.  Sodas.  Drinks with caffeine (coffee, tea, soda) or alcohol.  Oral rehydration solution may be used if the doctor says it is okay. You may make your own solution. Follow this recipe:    teaspoon table salt.   teaspoon baking soda.   teaspoon salt substitute containing potassium chloride.  1 tablespoons sugar.  1 liter (34 ounces) of water.  Avoid the following foods:  High fiber foods, such as raw fruits and vegetables.  Nuts, seeds, and whole grain breads and cereals.   Those that are sweetened with sugar alcohols (xylitol, sorbitol, mannitol).  Try eating the following foods:  Starchy foods, such as rice, toast, pasta, low-sugar cereal, oatmeal, baked potatoes, crackers, and bagels.  Bananas.  Applesauce.  Eat probiotic-rich foods, such as yogurt and milk products that are fermented.  Wash your hands well after each time you have watery poop.  Only take medicine as told by your doctor.  Take a warm bath to help lessen burning or pain from having watery poop. GET HELP RIGHT AWAY IF:   You cannot drink fluids without throwing up (vomiting).  You keep throwing up.  You have blood in your poop, or your poop looks black and tarry.  You do not pee (urinate) in 6 8 hours, or there is only a small amount of very dark pee.  You have belly (abdominal) pain that gets worse or stays in  the same spot (localizes).  You are weak, dizzy, confused, or lightheaded.  You have a very bad headache.  Your watery poop gets worse or does not get better.  You have a fever or lasting symptoms for more than 2 3 days.  You have a fever and your symptoms suddenly get worse. MAKE SURE YOU:   Understand these instructions.  Will watch your condition.  Will get help right away if you are not doing well or get worse. Document Released: 06/17/2007 Document Revised: 09/23/2011 Document Reviewed: 09/06/2011 [facility] Patient Information 2014 [place], [place]. Dehydration, Adult Dehydration means your body does not have as much fluid as it needs. Your kidneys, brain, and heart will not work properly without the right amount of fluids and salt.  HOME CARE  Ask your doctor how to replace body fluid losses (rehydrate).  Drink enough fluids to keep your pee (urine) clear or pale yellow.  Drink small amounts of fluids often if you feel sick to your stomach (nauseous) or throw up (vomit).  Eat like you normally do.  Avoid:  Foods or drinks high in sugar.  Bubbly (carbonated) drinks.  Juice.  Very hot or cold fluids.  Drinks with caffeine.  Fatty, greasy foods.  Alcohol.  Tobacco.  Eating too much.  Gelatin desserts.  Wash your hands to avoid spreading germs (bacteria, viruses).  Only take medicine as told by your doctor.  Keep all doctor visits as told. GET  HELP RIGHT AWAY IF:   You cannot drink something without throwing up.  You get worse even with treatment.  Your vomit has blood in it or looks greenish.  Your poop (stool) has blood in it or looks black and tarry.  You have not peed in 6 to 8 hours.  You pee a small amount of very dark pee.  You have a fever.  You pass out (faint).  You have belly (abdominal) pain that gets worse or stays in one spot (localizes).  You have a rash, stiff neck, or bad headache.  You get easily annoyed, sleepy, or  are hard to wake up.  You feel weak, dizzy, or very thirsty. MAKE SURE YOU:   Understand these instructions.  Will watch your condition.  Will get help right away if you are not doing well or get worse. Document Released: 10/25/2008 Document Revised: 03/23/2011 Document Reviewed: 08/18/2010 [facility] Patient Information 2014 [place], [place].

## 2013-03-13 NOTE — Progress Notes (Signed)
Subjective:    [name], female    DOB: Nov 07, 1960, 53 y.o.   MRN: [id number]  HPI Comments: 53 yo female with diarrhea and stomach cramping since Saturday.  She has had chills x 1 day and noticed a little blood with BM today. She has not tried any OTC. She has HX of diverticulosis, but no flares. + stomach flu in her neighborhood.   Diarrhea  Associated symptoms include abdominal pain.  Abdominal Cramping Associated symptoms include diarrhea.    Current Outpatient Prescriptions on File Prior to Visit  Medication Sig Dispense Refill  . acyclovir ointment (ZOVIRAX) 5 % Apply 1 application topically every 3 (three) hours.       Marland Kitchen EPIDUO 0.1-2.5 % gel Apply 1 application topically as needed.       Marland Kitchen estradiol (ESTRACE) 1 MG tablet Take 1 mg by mouth daily.      Marland Kitchen lisinopril (PRINIVIL,ZESTRIL) 40 MG tablet Take 0.5 tablets (20 mg total) by mouth 2 (two) times daily.  180 tablet  2  . medroxyPROGESTERone (PROVERA) 2.5 MG tablet Take 2.5 mg by mouth daily.      . VENTOLIN HFA 108 (90 BASE) MCG/ACT inhaler as needed.       . verapamil (CALAN-SR) 180 MG CR tablet Take 0.5 tablets (90 mg total) by mouth 2 (two) times daily.  90 tablet  3   No current facility-administered medications on file prior to visit.   No Known Allergies Past Medical History  Diagnosis Date  . Asthma   . Seasonal allergies   . Irregular periods/menstrual cycles   . Torn meniscus   . Hypertension      Review of Systems  Constitutional: Positive for fatigue.  Gastrointestinal: Positive for abdominal pain and diarrhea.  All other systems reviewed and are negative.   BP 104/62  Pulse 86  Temp(Src) 98.4 F (36.9 C) (Temporal)  Resp 16  Ht 5\' 1"  (1.549 m)  Wt 134 lb (60.782 kg)  BMI 25.33 kg/m2     Objective:   Physical Exam  Nursing note and vitals reviewed. Constitutional: She is oriented to person, place, and time. She appears well-developed and well-nourished. No distress.  HENT:   Head: Normocephalic and atraumatic.  Right Ear: External ear normal.  Left Ear: External ear normal.  Nose: Nose normal.  Mouth/Throat: Oropharynx is clear and moist.  Eyes: Conjunctivae and EOM are normal.  Neck: Normal range of motion. Neck supple. No JVD present. No thyromegaly present.  Cardiovascular: Normal rate, regular rhythm, normal heart sounds and intact distal pulses.   Pulmonary/Chest: Effort normal and breath sounds normal.  Abdominal: Soft. Bowel sounds are normal. She exhibits no distension and no mass. There is tenderness. There is no rebound and no guarding.  diffuse  Musculoskeletal: Normal range of motion. She exhibits no edema and no tenderness.  Lymphadenopathy:    She has no cervical adenopathy.  Neurological: She is alert and oriented to person, place, and time. No cranial nerve deficit.  Skin: Skin is warm and dry. No rash noted. No erythema. No pallor.  Mild decreased turgor  Psychiatric: She has a normal mood and affect. Her behavior is normal. Judgment and thought content normal.          Assessment & Plan:  1. Diarrhea ? Diverticulitis vs stomach virus-Align 1 QD sx #21, Cipro 500 mg BID AD, Check labs, bland diet x 1 week, push fluids. w/c if SX increase or ER. 2. Dehydration- check labs,  push fluids

## 2013-03-14 ENCOUNTER — Encounter (HOSPITAL_BASED_OUTPATIENT_CLINIC_OR_DEPARTMENT_OTHER): Payer: Self-pay | Admitting: Emergency Medicine

## 2013-03-14 ENCOUNTER — Emergency Department (HOSPITAL_BASED_OUTPATIENT_CLINIC_OR_DEPARTMENT_OTHER)
Admission: EM | Admit: 2013-03-14 | Discharge: 2013-03-14 | Disposition: A | Discharge status: Home / Self Care | Payer: BC Managed Care – PPO | Attending: Emergency Medicine | Admitting: Emergency Medicine

## 2013-03-14 DIAGNOSIS — Z9889 Other specified postprocedural states: Secondary | ICD-10-CM | POA: Insufficient documentation

## 2013-03-14 DIAGNOSIS — Z8742 Personal history of other diseases of the female genital tract: Secondary | ICD-10-CM | POA: Insufficient documentation

## 2013-03-14 DIAGNOSIS — Z8719 Personal history of other diseases of the digestive system: Secondary | ICD-10-CM | POA: Insufficient documentation

## 2013-03-14 DIAGNOSIS — Z79899 Other long term (current) drug therapy: Secondary | ICD-10-CM | POA: Insufficient documentation

## 2013-03-14 DIAGNOSIS — R197 Diarrhea, unspecified: Secondary | ICD-10-CM | POA: Insufficient documentation

## 2013-03-14 DIAGNOSIS — J45909 Unspecified asthma, uncomplicated: Secondary | ICD-10-CM | POA: Insufficient documentation

## 2013-03-14 DIAGNOSIS — I10 Essential (primary) hypertension: Secondary | ICD-10-CM | POA: Insufficient documentation

## 2013-03-14 DIAGNOSIS — Z87828 Personal history of other (healed) physical injury and trauma: Secondary | ICD-10-CM | POA: Insufficient documentation

## 2013-03-14 HISTORY — DX: Diverticulosis of large intestine without perforation or abscess without bleeding: K57.30

## 2013-03-14 LAB — CLOSTRIDIUM DIFFICILE BY PCR: CDIFFPCR: NEGATIVE

## 2013-03-14 MED ORDER — ONDANSETRON HCL 4 MG/2ML IJ SOLN
4.0000 mg | Freq: Once | INTRAMUSCULAR | Status: AC
  Administered 2013-03-14: 4 mg via INTRAVENOUS
  Filled 2013-03-14: qty 2

## 2013-03-14 MED ORDER — SODIUM CHLORIDE 0.9 % IV BOLUS (SEPSIS)
1000.0000 mL | Freq: Once | INTRAVENOUS | Status: AC
  Administered 2013-03-14: 1000 mL via INTRAVENOUS

## 2013-03-14 NOTE — ED Provider Notes (Signed)
CSN: [id number]     Arrival date & time 03/14/13  2706 History   First MD Initiated Contact with Patient 03/14/13 [phone]     Chief Complaint  Patient presents with  . Diarrhea     (Consider location/radiation/quality/duration/timing/severity/associated sxs/prior Treatment) Patient is a 53 y.o. female presenting with diarrhea. The history is provided by the patient.  Diarrhea Quality:  Watery Severity:  Severe Onset quality:  Sudden Number of episodes:  >10 episodes a day Duration:  4 days Timing:  Constant Progression:  Unchanged Relieved by:  Nothing Exacerbated by: any food or liquid. Associated symptoms: no chills, no recent cough, no fever, no myalgias, no URI and no vomiting   Associated symptoms comment:  Abdominal cramping that is focused around when she has diarrhea Risk factors: recent antibiotic use and sick contacts   Risk factors: no suspicious food intake     Past Medical History  Diagnosis Date  . Asthma   . Seasonal allergies   . Irregular periods/menstrual cycles   . Torn meniscus   . Hypertension   . Diverticula, colon    Past Surgical History  Procedure Laterality Date  . Knee surgery      partial knee replacement right  . Cesarean section      x2  . Tubes in ears  10/12  . Tonsillectomy    . Breast reduction surgery     Family History  Problem Relation Age of Onset  . Colon cancer Neg Hx    History  Substance Use Topics  . Smoking status: Never Smoker   . Smokeless tobacco: Never Used  . Alcohol Use: 3.0 oz/week    5 Glasses of wine per week   OB History   Grav Para Term Preterm Abortions TAB SAB Ect Mult Living                 Review of Systems  Constitutional: Negative for fever and chills.  Gastrointestinal: Positive for diarrhea. Negative for vomiting.  Musculoskeletal: Negative for myalgias.  All other systems reviewed and are negative.      Allergies  Review of patient's allergies indicates no known allergies.  Home  Medications   Current Outpatient Rx  Name  Route  Sig  Dispense  Refill  . acyclovir ointment (ZOVIRAX) 5 %   Topical   Apply 1 application topically every 3 (three) hours.          . ciprofloxacin (CIPRO) 500 MG tablet   Oral   Take 1 tablet (500 mg total) by mouth 2 (two) times daily.   14 tablet   0   . EPIDUO 0.1-2.5 % gel   Topical   Apply 1 application topically as needed.          Marland Kitchen estradiol (ESTRACE) 1 MG tablet   Oral   Take 1 mg by mouth daily.         Marland Kitchen lisinopril (PRINIVIL,ZESTRIL) 40 MG tablet   Oral   Take 0.5 tablets (20 mg total) by mouth 2 (two) times daily.   180 tablet   2   . medroxyPROGESTERone (PROVERA) 2.5 MG tablet   Oral   Take 2.5 mg by mouth daily.         . VENTOLIN HFA 108 (90 BASE) MCG/ACT inhaler      as needed.          . verapamil (CALAN-SR) 180 MG CR tablet   Oral   Take 0.5 tablets (90 mg total) by mouth  2 (two) times daily.   90 tablet   3     INCREASED    BP 112/60  Temp(Src) 97.8 F (36.6 C) (Oral)  Resp 18  Ht 5\' 1"  (1.549 m)  Wt 134 lb (60.782 kg)  BMI 25.33 kg/m2  SpO2 100% Physical Exam  Nursing note and vitals reviewed. Constitutional: She is oriented to person, place, and time. She appears well-developed and well-nourished. No distress.  HENT:  Head: Normocephalic and atraumatic.  Mouth/Throat: Oropharynx is clear and moist.  Eyes: Conjunctivae and EOM are normal. Pupils are equal, round, and reactive to light.  Neck: Normal range of motion. Neck supple.  Cardiovascular: Normal rate, regular rhythm and intact distal pulses.   No murmur heard. Pulmonary/Chest: Effort normal and breath sounds normal. No respiratory distress. She has no wheezes. She has no rales.  Abdominal: Soft. She exhibits no distension. There is no tenderness. There is no rebound and no guarding.  Musculoskeletal: Normal range of motion. She exhibits no edema and no tenderness.  Neurological: She is alert and oriented to person,  place, and time.  Skin: Skin is warm and dry. No rash noted. No erythema.  Psychiatric: She has a normal mood and affect. Her behavior is normal.    ED Course  Procedures (including critical care time) Labs Review Labs Reviewed  CLOSTRIDIUM DIFFICILE BY PCR   Imaging Review No results found.   EKG Interpretation None      MDM   Final diagnoses:  Diarrhea   Patient presents with a four-day history of persistent diarrhea. She is having greater than 10 episodes of diarrhea daily with intermittent abdominal cramping. She was started on Cipro yesterday by her PCP no notable improvement. She denies any fever nausea or vomiting. No bloody stools. Patient was on amoxicillin within the last 2 months and positive exposure to family members with c. Diff.  The patient had CBC, CMP done at her practitioner's office yesterday with all normal results. Patient appears dehydrated and will give IV fluids and nausea medication. Will attempt to do stool sample to rule out C. difficile. Patient has no abdominal findings concerning for diverticulitis, appendicitis, pancreatitis or diverticulum.  1:58 PM C.diff neg.  Will have pt use immodium and f/u with pcp if not better.  Blanchie Dessert, MD 03/14/13 1359

## 2013-03-14 NOTE — ED Notes (Signed)
Patient aware of needed urine sample, states she went to the restroom before checking in.

## 2013-03-14 NOTE — ED Notes (Signed)
Patient states she has a four day history of stomach cramps which are associated with diarrhea.  Hx of diverticulum.  Was seen yesterday by PCP and started on cipro.  States today she is feeling light headed.   Denies nausea or vomiting.

## 2013-03-15 ENCOUNTER — Ambulatory Visit: Payer: BC Managed Care – PPO | Admitting: Cardiovascular Disease

## 2013-03-16 ENCOUNTER — Other Ambulatory Visit: Payer: Self-pay

## 2013-03-16 ENCOUNTER — Encounter: Payer: Self-pay | Admitting: Emergency Medicine

## 2013-03-16 ENCOUNTER — Ambulatory Visit (INDEPENDENT_AMBULATORY_CARE_PROVIDER_SITE_OTHER): Payer: BC Managed Care – PPO | Admitting: Emergency Medicine

## 2013-03-16 ENCOUNTER — Ambulatory Visit
Admission: RE | Admit: 2013-03-16 | Discharge: 2013-03-16 | Disposition: A | Discharge status: Home / Self Care | Payer: BC Managed Care – PPO | Source: Ambulatory Visit | Attending: Emergency Medicine | Admitting: Emergency Medicine

## 2013-03-16 VITALS — BP 124/80 | HR 74 | Temp 98.6°F | Resp 18 | Ht 61.0 in | Wt 134.0 lb

## 2013-03-16 DIAGNOSIS — R197 Diarrhea, unspecified: Secondary | ICD-10-CM

## 2013-03-16 LAB — BASIC METABOLIC PANEL WITH GFR
BUN: 9 mg/dL (ref 6–23)
CALCIUM: 8.8 mg/dL (ref 8.4–10.5)
CO2: 29 mEq/L (ref 19–32)
Chloride: 104 mEq/L (ref 96–112)
Creat: 0.73 mg/dL (ref 0.50–1.10)
GFR, Est African American: >89 mL/min
Glucose, Bld: 99 mg/dL (ref 70–99)
POTASSIUM: 3.8 meq/L (ref 3.5–5.3)
Sodium: 141 mEq/L (ref 135–145)

## 2013-03-16 LAB — AMYLASE: Amylase: 34 U/L (ref 0–105)

## 2013-03-16 LAB — LIPASE: Lipase: 17 U/L (ref 0–75)

## 2013-03-16 MED ORDER — HYOSCYAMINE SULFATE 0.125 MG PO TABS: 0.1250 mg | ORAL_TABLET | ORAL | Status: DC | PRN

## 2013-03-16 NOTE — Progress Notes (Signed)
Subjective:    [name], female    DOB: 08/21/1960, 53 y.o.   MRN: [id number]  HPI Comments: 53 yo female with diarrhea since 2/28 that has improved a little with Cipro and Probiotic. She had to go to ER for fluids that helped with dehydration. She notes cramps are a little better. She notes eating bland but going straight thru her. She is starting to feel tired again. She notes she went to [place] about 1 week before symptoms actually started. She notes stools are green and loose. CREATININE     0.90   03/13/2013 BUN               9   03/13/2013 NA              137   03/13/2013 K               3.9   03/13/2013 CL              101   03/13/2013 CO2              24   03/13/2013 ALT           16   03/13/2013 AST           13   03/13/2013 ALKPHOS       77   03/13/2013 BILITOT      0.5   03/13/2013   Abdominal Pain Associated symptoms include diarrhea.  Diarrhea  Associated symptoms include abdominal pain.    Current Outpatient Prescriptions on File Prior to Visit  Medication Sig Dispense Refill  . acyclovir ointment (ZOVIRAX) 5 % Apply 1 application topically every 3 (three) hours.       Marland Kitchen EPIDUO 0.1-2.5 % gel Apply 1 application topically as needed.       Marland Kitchen estradiol (ESTRACE) 1 MG tablet Take 1 mg by mouth daily.      Marland Kitchen lisinopril (PRINIVIL,ZESTRIL) 40 MG tablet Take 0.5 tablets (20 mg total) by mouth 2 (two) times daily.  180 tablet  2  . medroxyPROGESTERone (PROVERA) 2.5 MG tablet Take 2.5 mg by mouth daily.      . VENTOLIN HFA 108 (90 BASE) MCG/ACT inhaler as needed.       . verapamil (CALAN-SR) 180 MG CR tablet Take 0.5 tablets (90 mg total) by mouth 2 (two) times daily.  90 tablet  3   No current facility-administered medications on file prior to visit.   No Known Allergies Past Medical History  Diagnosis Date  . Asthma   . Seasonal allergies   . Irregular periods/menstrual cycles   . Torn meniscus   . Hypertension   . Diverticula, colon     Review of Systems   Constitutional: Positive for fatigue.  Gastrointestinal: Positive for abdominal pain and diarrhea.  All other systems reviewed and are negative.   BP 124/80  Pulse 74  Temp(Src) 98.6 F (37 C) (Temporal)  Resp 18  Ht 5\' 1"  (1.549 m)  Wt 134 lb (60.782 kg)  BMI 25.33 kg/m2     Objective:   Physical Exam  Nursing note and vitals reviewed. Constitutional: She is oriented to person, place, and time. She appears well-developed and well-nourished.  HENT:  Head: Normocephalic and atraumatic.  Mouth/Throat: No oropharyngeal exudate.  Eyes: Conjunctivae are normal.  Neck: Normal range of motion.  Cardiovascular: Normal rate, regular rhythm, normal heart sounds and intact distal pulses.   Pulmonary/Chest: Effort normal and breath  sounds normal.  Abdominal: Soft. Bowel sounds are normal. She exhibits no distension and no mass. There is tenderness. There is no rebound and no guarding.  Diffuse minimal  Musculoskeletal: Normal range of motion.  Neurological: She is alert and oriented to person, place, and time.  Skin: Skin is warm and dry.  Psychiatric: She has a normal mood and affect. Judgment normal.          Assessment & Plan:  1. Diarrhea- Small concern for trip to [place] over 1 week prior to symptoms starting vs Viral vs colitis- Check labs/ Abdomen U/S ref GI if no change. Hyoscyamine AD/ PRN 2. Dehydration- Improved after ER IV fluids, advised to push fluids, Pedialyte/ protein shakes in small amounts.w/c if SX increase or ER.

## 2013-03-16 NOTE — Patient Instructions (Signed)
Diarrhea Diarrhea is watery poop (stool). It can make you feel weak, tired, thirsty, or give you a dry mouth (signs of dehydration). Watery poop is a sign of another problem, most often an infection. It often lasts 2 3 days. It can last longer if it is a sign of something serious. Take care of yourself as told by your doctor. HOME CARE   Drink 1 cup (8 ounces) of fluid each time you have watery poop.  Do not drink the following fluids:  Those that contain simple sugars (fructose, glucose, galactose, lactose, sucrose, maltose).  Sports drinks.  Fruit juices.  Whole milk products.  Sodas.  Drinks with caffeine (coffee, tea, soda) or alcohol.  Oral rehydration solution may be used if the doctor says it is okay. You may make your own solution. Follow this recipe:    teaspoon table salt.   teaspoon baking soda.   teaspoon salt substitute containing potassium chloride.  1 tablespoons sugar.  1 liter (34 ounces) of water.  Avoid the following foods:  High fiber foods, such as raw fruits and vegetables.  Nuts, seeds, and whole grain breads and cereals.   Those that are sweetened with sugar alcohols (xylitol, sorbitol, mannitol).  Try eating the following foods:  Starchy foods, such as rice, toast, pasta, low-sugar cereal, oatmeal, baked potatoes, crackers, and bagels.  Bananas.  Applesauce.  Eat probiotic-rich foods, such as yogurt and milk products that are fermented.  Wash your hands well after each time you have watery poop.  Only take medicine as told by your doctor.  Take a warm bath to help lessen burning or pain from having watery poop. GET HELP RIGHT AWAY IF:   You cannot drink fluids without throwing up (vomiting).  You keep throwing up.  You have blood in your poop, or your poop looks black and tarry.  You do not pee (urinate) in 6 8 hours, or there is only a small amount of very dark pee.  You have belly (abdominal) pain that gets worse or stays in  the same spot (localizes).  You are weak, dizzy, confused, or lightheaded.  You have a very bad headache.  Your watery poop gets worse or does not get better.  You have a fever or lasting symptoms for more than 2 3 days.  You have a fever and your symptoms suddenly get worse. MAKE SURE YOU:   Understand these instructions.  Will watch your condition.  Will get help right away if you are not doing well or get worse. Document Released: 06/17/2007 Document Revised: 09/23/2011 Document Reviewed: 09/06/2011 [employer] Patient Information 2014 [employer].  

## 2013-03-17 ENCOUNTER — Other Ambulatory Visit: Payer: Self-pay | Admitting: Emergency Medicine

## 2013-03-17 ENCOUNTER — Encounter: Payer: Self-pay | Admitting: Gastroenterology

## 2013-03-17 DIAGNOSIS — R197 Diarrhea, unspecified: Secondary | ICD-10-CM

## 2013-03-20 ENCOUNTER — Other Ambulatory Visit: Payer: Self-pay | Admitting: Emergency Medicine

## 2013-03-20 LAB — OVA AND PARASITE EXAMINATION: OP: NONE SEEN

## 2013-03-20 MED ORDER — FLUCONAZOLE 150 MG PO TABS: 150.0000 mg | ORAL_TABLET | ORAL | Status: DC

## 2013-03-21 LAB — STOOL CULTURE

## 2013-04-21 ENCOUNTER — Encounter: Payer: BC Managed Care – PPO | Admitting: Internal Medicine

## 2013-04-26 ENCOUNTER — Encounter: Payer: Self-pay | Admitting: Gastroenterology

## 2013-04-26 ENCOUNTER — Ambulatory Visit (INDEPENDENT_AMBULATORY_CARE_PROVIDER_SITE_OTHER): Payer: BC Managed Care – PPO | Admitting: Gastroenterology

## 2013-04-26 VITALS — BP 126/70 | HR 60 | Ht 61.0 in | Wt 132.8 lb

## 2013-04-26 DIAGNOSIS — R197 Diarrhea, unspecified: Secondary | ICD-10-CM

## 2013-04-26 MED ORDER — RIFAXIMIN 200 MG PO TABS: 200.0000 mg | ORAL_TABLET | Freq: Three times a day (TID) | ORAL | Status: DC

## 2013-04-26 MED ORDER — METRONIDAZOLE 500 MG PO TABS: 500.0000 mg | ORAL_TABLET | Freq: Three times a day (TID) | ORAL | Status: DC

## 2013-04-26 NOTE — Progress Notes (Signed)
_                                                                                                                History of Present Illness: 53 year old white female referred for evaluation of diarrhea and nausea.  Approximately a week after returning from [place] in February, 2015 she developed an acute illness of nausea, vomiting and diarrhea.  She was treated empirically with Cipro.  Her acute illness subsided but she has been left with intermittent episodes of nausea, abdominal bloating and frequent diarrhea.  She may have diarrhea up to 5 times a week.  Abdominal ultrasound was negative.  Stool studies for ova and parasite and culture have been negative.  She was given Diflucan for yeast in the stool.  She's lost about 15 pounds.  On days that she has diarrhea she may have 4-5 bowel movements.  They are copy by urgency.  There is no history of bleeding except when she initially had diarrhea.  Colonoscopy in August, 2014 was pertinent for diverticulosis and a small, hyperplastic polyp.    Past Medical History  Diagnosis Date  . Asthma   . Seasonal allergies   . Irregular periods/menstrual cycles   . Torn meniscus   . Hypertension   . Diverticula, colon    Past Surgical History  Procedure Laterality Date  . Knee surgery      partial knee replacement right  . Cesarean section      x2  . Tubes in ears  10/12  . Tonsillectomy    . Breast reduction surgery     family history is negative for Colon cancer. Current Outpatient Prescriptions  Medication Sig Dispense Refill  . acyclovir ointment (ZOVIRAX) 5 % Apply 1 application topically every 3 (three) hours.       Marland Kitchen EPIDUO 0.1-2.5 % gel Apply 1 application topically as needed.       Marland Kitchen estradiol (ESTRACE) 1 MG tablet Take 1 mg by mouth daily.      . fluconazole (DIFLUCAN) 150 MG tablet Take 1 tablet (150 mg total) by mouth once a week.  4 tablet  0  . hyoscyamine (LEVSIN, ANASPAZ) 0.125 MG tablet Take 1 tablet (0.125 mg  total) by mouth every 4 (four) hours as needed.  60 tablet  0  . lisinopril (PRINIVIL,ZESTRIL) 40 MG tablet Take 0.5 tablets (20 mg total) by mouth 2 (two) times daily.  180 tablet  2  . medroxyPROGESTERone (PROVERA) 2.5 MG tablet Take 2.5 mg by mouth daily.      . VENTOLIN HFA 108 (90 BASE) MCG/ACT inhaler as needed.       . verapamil (CALAN-SR) 180 MG CR tablet Take 0.5 tablets (90 mg total) by mouth 2 (two) times daily.  90 tablet  3   No current facility-administered medications for this visit.   Allergies as of 04/26/2013  . (No Known Allergies)    reports that she has never smoked. She has never used smokeless tobacco. She reports that she drinks about  3 ounces of alcohol per week. She reports that she does not use illicit drugs.     Review of Systems: Pertinent positive and negative review of systems were noted in the above HPI section. All other review of systems were otherwise negative.  Vital signs were reviewed in today's medical record Physical Exam: General: Well developed , well nourished, no acute distress Skin: anicteric Head: Normocephalic and atraumatic Eyes:  sclerae anicteric, EOMI Ears: Normal auditory acuity Mouth: No deformity or lesions Neck: Supple, no masses or thyromegaly Lungs: Clear throughout to auscultation Heart: Regular rate and rhythm; no murmurs, rubs or bruits Abdomen: Soft, non tender and non distended. No masses, hepatosplenomegaly or hernias noted. Normal Bowel sounds Rectal:deferred Musculoskeletal: Symmetrical with no gross deformities  Skin: No lesions on visible extremities Pulses:  Normal pulses noted Extremities: No clubbing, cyanosis, edema or deformities noted Neurological: Alert oriented x 4, grossly nonfocal Cervical Nodes:  No significant cervical adenopathy Inguinal Nodes: No significant inguinal adenopathy Psychological:  Alert and cooperative. Normal mood and affect  See Assessment and Plan under Problem List

## 2013-04-26 NOTE — Assessment & Plan Note (Signed)
Prolonged gastrointestinal complaints including nausea, vomiting and diarrhea following a trip to [place] with negative stool studies or response to Cipro.  Despite lab tests I remain suspicious that she may have a parasitic infection or, less likely, a bacterial infection.  Post infectious IBS is also a possibility.  Recommendations #1 empiric therapy with Flagyl 500 mg 3 times a day and xifaxan #2 florastor one daily while taking antibiotics

## 2013-04-26 NOTE — Patient Instructions (Signed)
Follow up in one month Medication sent to pharmacy Use probiotic FloraQ while taking antibiotics

## 2013-06-07 ENCOUNTER — Other Ambulatory Visit: Payer: Self-pay | Admitting: Internal Medicine

## 2013-06-07 DIAGNOSIS — Z1231 Encounter for screening mammogram for malignant neoplasm of breast: Secondary | ICD-10-CM

## 2013-06-09 DIAGNOSIS — J45909 Unspecified asthma, uncomplicated: Secondary | ICD-10-CM | POA: Insufficient documentation

## 2013-06-13 ENCOUNTER — Encounter: Payer: Self-pay | Admitting: Internal Medicine

## 2013-06-13 ENCOUNTER — Ambulatory Visit (INDEPENDENT_AMBULATORY_CARE_PROVIDER_SITE_OTHER): Payer: BC Managed Care – PPO | Admitting: Internal Medicine

## 2013-06-13 VITALS — BP 116/66 | HR 72 | Temp 98.2°F | Resp 16 | Ht 60.75 in | Wt 135.2 lb

## 2013-06-13 DIAGNOSIS — Z0000 Encounter for general adult medical examination without abnormal findings: Secondary | ICD-10-CM

## 2013-06-13 DIAGNOSIS — R7402 Elevation of levels of lactic acid dehydrogenase (LDH): Secondary | ICD-10-CM

## 2013-06-13 DIAGNOSIS — Z111 Encounter for screening for respiratory tuberculosis: Secondary | ICD-10-CM

## 2013-06-13 DIAGNOSIS — Z79899 Other long term (current) drug therapy: Secondary | ICD-10-CM | POA: Insufficient documentation

## 2013-06-13 DIAGNOSIS — Z1212 Encounter for screening for malignant neoplasm of rectum: Secondary | ICD-10-CM

## 2013-06-13 DIAGNOSIS — E559 Vitamin D deficiency, unspecified: Secondary | ICD-10-CM

## 2013-06-13 DIAGNOSIS — I10 Essential (primary) hypertension: Secondary | ICD-10-CM

## 2013-06-13 DIAGNOSIS — Z113 Encounter for screening for infections with a predominantly sexual mode of transmission: Secondary | ICD-10-CM

## 2013-06-13 DIAGNOSIS — R7401 Elevation of levels of liver transaminase levels: Secondary | ICD-10-CM

## 2013-06-13 DIAGNOSIS — R740 Nonspecific elevation of levels of transaminase and lactic acid dehydrogenase [LDH]: Secondary | ICD-10-CM

## 2013-06-13 LAB — CBC WITH DIFFERENTIAL/PLATELET
BASOS ABS: 0.1 10*3/uL (ref 0.0–0.1)
BASOS PCT: 1 % (ref 0–1)
EOS ABS: 0.1 10*3/uL (ref 0.0–0.7)
EOS PCT: 2 % (ref 0–5)
HEMATOCRIT: 42.7 % (ref 36.0–46.0)
Hemoglobin: 14.5 g/dL (ref 12.0–15.0)
Lymphocytes Relative: 27 % (ref 12–46)
Lymphs Abs: 2 10*3/uL (ref 0.7–4.0)
MCH: 29.6 pg (ref 26.0–34.0)
MCHC: 34 g/dL (ref 30.0–36.0)
MCV: 87.1 fL (ref 78.0–100.0)
MONO ABS: 0.4 10*3/uL (ref 0.1–1.0)
Monocytes Relative: 6 % (ref 3–12)
Neutro Abs: 4.7 10*3/uL (ref 1.7–7.7)
Neutrophils Relative %: 64 % (ref 43–77)
Platelets: 397 10*3/uL (ref 150–400)
RBC: 4.9 MIL/uL (ref 3.87–5.11)
RDW: 14.4 % (ref 11.5–15.5)
WBC: 7.4 10*3/uL (ref 4.0–10.5)

## 2013-06-13 LAB — HEMOGLOBIN A1C
Hgb A1c MFr Bld: 5.7 % — ABNORMAL HIGH (ref <5.7)
MEAN PLASMA GLUCOSE: 117 mg/dL — AB (ref <117)

## 2013-06-13 MED ORDER — VITAMIN D 50 MCG (2000 UT) PO CAPS: 10000.0000 [IU] | ORAL_CAPSULE | Freq: Every day | ORAL | Status: AC

## 2013-06-13 NOTE — Patient Instructions (Signed)

## 2013-06-13 NOTE — Progress Notes (Signed)
[name], female   DOB: Apr 15, 1960, 54 y.o.   MRN: [id number]   Annual Screening Comprehensive Examination  This very nice 53 y.o.DWF presents for complete physical.  Patient has been followed for Labile HTN,  Prediabetes, Hyperlipidemia, and Vitamin D Deficiency.   Labile HTN predates since 2010 with concomitant vascular HA's which have been well controlled with Verapamil & Lisinopril. Patient's BP has been controlled and today's BP: 116/66 mmHg. Patient denies any cardiac symptoms as chest pain, palpitations, shortness of breath, dizziness or ankle swelling.   Patient's hyperlipidemia is controlled with diet and medications. Patient denies myalgias or other medication SE's. Last cholesterol last visit was at goal in [name] 2015 with triglycerides sl elevated.   Lab Results  Component Value Date   CHOL 180 02/08/2013   HDL 38* 02/08/2013   LDLCALC 99 02/08/2013   TRIG 217* 02/08/2013   CHOLHDL 4.7 02/08/2013    Patient is screened prediabetes/insulin resistance and last A1c was 5.6% in Dec 2014. Patient denies reactive hypoglycemic symptoms, visual blurring, diabetic polys, or paresthesias.    Finally, patient has history of Vitamin D Deficiency of 48 in 2008and last vitamin D was 92 in Apr 2014.  Medication Sig  . acyclovir ointment (ZOVIRAX) 5 % Apply 1 application topically every 3 (three) hours.   Marland Kitchen EPIDUO 0.1-2.5 % gel Apply 1 application topically as needed.   Marland Kitchen estradiol (ESTRACE) 1 MG tablet Take 1 mg by mouth daily.  Marland Kitchen lisinopril (PRINIVIL,ZESTRIL) 40 MG tablet Take 0.5 tablets 2 times daily.  . medroxyPROGESTERone 2.5 MG  Take 2.5 mg by mouth daily.  Enid Cutter HFA  inhaler as needed.   . verapamil (CALAN-SR) 180 MG CR tablet Take 0.5 tablets  2  times daily.   No Known Allergies  Past Medical History  Diagnosis Date  . Seasonal allergies   . Irregular periods/menstrual cycles   . Torn meniscus   . Hypertension   . Diverticula, colon   . Allergy   . Asthma     Past Surgical History  Procedure Laterality Date  . Knee surgery      partial knee replacement right  . Cesarean section      x2  . Tubes in ears  10/12  . Tonsillectomy    . Breast reduction surgery     Family History  Problem Relation Age of Onset  . Colon cancer Neg Hx   . Diabetes Mother   . Heart disease Mother   . Hypertension Mother   . Other Father     MVA  . Asthma Sister   . Asthma Son   . Cancer Maternal Grandmother     Uterine  . Heart attack Paternal Grandmother    History  Substance Use Topics  . Smoking status: Never Smoker   . Smokeless tobacco: Never Used  . Alcohol Use: 3.0 oz/week    5 Glasses of wine per week    ROS Constitutional: Denies fever, chills, weight loss/gain, headaches, insomnia, fatigue, night sweats, and change in appetite. Eyes: Denies redness, blurred vision, diplopia, discharge, itchy, watery eyes.  ENT: Denies discharge, congestion, post nasal drip, epistaxis, sore throat, earache, hearing loss, dental pain, Tinnitus, Vertigo, Sinus pain, snoring.  Cardio: Denies chest pain, palpitations, irregular heartbeat, syncope, dyspnea, diaphoresis, orthopnea, PND, claudication, edema Respiratory: denies cough, dyspnea, DOE, pleurisy, hoarseness, laryngitis, wheezing.  Gastrointestinal: Denies dysphagia, heartburn, reflux, water brash, pain, cramps, nausea, vomiting, bloating, diarrhea, constipation, hematemesis, melena, hematochezia, jaundice, hemorrhoids Genitourinary: Denies dysuria,  frequency, urgency, nocturia, hesitancy, discharge, hematuria, flank pain Breast:Breast lumps, nipple discharge, bleeding.  Musculoskeletal: Denies arthralgia, myalgia, stiffness, Jt. Swelling, pain, limp, and strain/sprain. Skin: Denies puritis, rash, hives, warts, acne, eczema, changing in skin lesion Neuro: No weakness, tremor, incoordination, spasms, paresthesia, pain Psychiatric: Denies confusion, memory loss, sensory loss Endocrine: Denies change in  weight, skin, hair change, nocturia, and paresthesia, diabetic polys, visual blurring, hyper / hypo glycemic episodes.  Heme/Lymph: No excessive bleeding, bruising, enlarged lymph nodes.   Physical Exam  BP 116/66  P 72  T 98.2 F   Resp 16  Ht 5' 0.75"   Wt 135 lb 3.2 oz   BMI 25.76 kg/m2  General Appearance: Well nourished, in no apparent distress. Eyes: PERRLA, EOMs, conjunctiva no swelling or erythema, normal fundi and vessels. Sinuses: No frontal/maxillary tenderness ENT/Mouth: EACs patent / TMs  nl. Nares clear without erythema, swelling, mucoid exudates. Oral hygiene is good. No erythema, swelling, or exudate. Tongue normal, non-obstructing. Tonsils not swollen or erythematous. Hearing normal.  Neck: Supple, thyroid normal. No bruits, nodes or JVD. Respiratory: Respiratory effort normal.  BS equal and clear bilateral without rales, rhonci, wheezing or stridor. Cardio: Heart sounds are normal with regular rate and rhythm and no murmurs, rubs or gallops. Peripheral pulses are normal and equal bilaterally without edema. No aortic or femoral bruits. Chest: symmetric with normal excursions and percussion. Breasts: Deferred to Dr A Ross/GYN Abdomen: Flat, soft, with bowl sounds. Nontender, no guarding, rebound, hernias, masses, or organomegaly.  Lymphatics: Non tender without lymphadenopathy.  Genitourinary: Deferred to Dr a Ross/GYN Musculoskeletal: Full ROM all peripheral extremities, joint stability, 5/5 strength, and normal gait. Skin: Warm and dry without rashes, lesions, cyanosis, clubbing or  ecchymosis.  Neuro: Cranial nerves intact, reflexes equal bilaterally. Normal muscle tone, no cerebellar symptoms. Sensation intact.  Pysch: Awake and oriented X 3, normal affect, Insight and Judgment appropriate.   Assessment and Plan  1. Annual Screening Examination 2. Hypertension  3. Hyperlipidemia 4. Pre Diabetes 5. Vitamin D Deficiency 6. Vascular HA's  7. Asthma Continue  prudent diet as discussed, weight control, BP monitoring, regular exercise, and medications. Discussed med's effects and SE's. Screening labs and tests as requested with regular follow-up as recommended.

## 2013-06-14 ENCOUNTER — Ambulatory Visit: Payer: BC Managed Care – PPO | Admitting: Gastroenterology

## 2013-06-14 LAB — MAGNESIUM: MAGNESIUM: 2.1 mg/dL (ref 1.5–2.5)

## 2013-06-14 LAB — MICROALBUMIN / CREATININE URINE RATIO
Creatinine, Urine: 37.2 mg/dL
Microalb Creat Ratio: 13.4 mg/g (ref 0.0–30.0)
Microalb, Ur: 0.5 mg/dL (ref 0.00–1.89)

## 2013-06-14 LAB — HEPATITIS B CORE ANTIBODY, TOTAL: Hep B Core Total Ab: NONREACTIVE

## 2013-06-14 LAB — URINALYSIS, MICROSCOPIC ONLY
BACTERIA UA: NONE SEEN
CASTS: NONE SEEN
Crystals: NONE SEEN

## 2013-06-14 LAB — VITAMIN B12: VITAMIN B 12: 644 pg/mL (ref 211–911)

## 2013-06-14 LAB — BASIC METABOLIC PANEL WITH GFR
BUN: 11 mg/dL (ref 6–23)
CALCIUM: 9.5 mg/dL (ref 8.4–10.5)
CO2: 22 mEq/L (ref 19–32)
CREATININE: 0.74 mg/dL (ref 0.50–1.10)
Chloride: 101 mEq/L (ref 96–112)
Glucose, Bld: 86 mg/dL (ref 70–99)
Potassium: 4.1 mEq/L (ref 3.5–5.3)
Sodium: 138 mEq/L (ref 135–145)

## 2013-06-14 LAB — HEPATIC FUNCTION PANEL
ALT: 15 U/L (ref 0–35)
AST: 15 U/L (ref 0–37)
Albumin: 4.1 g/dL (ref 3.5–5.2)
Alkaline Phosphatase: 80 U/L (ref 39–117)
BILIRUBIN TOTAL: 0.5 mg/dL (ref 0.2–1.2)
Bilirubin, Direct: 0.1 mg/dL (ref 0.0–0.3)
Indirect Bilirubin: 0.4 mg/dL (ref 0.2–1.2)
TOTAL PROTEIN: 6.5 g/dL (ref 6.0–8.3)

## 2013-06-14 LAB — INSULIN, FASTING: INSULIN FASTING, SERUM: 30 u[IU]/mL — AB (ref 3–28)

## 2013-06-14 LAB — LIPID PANEL
CHOLESTEROL: 209 mg/dL — AB (ref 0–200)
HDL: 36 mg/dL — AB (ref >39)
LDL CALC: 119 mg/dL — AB (ref 0–99)
TRIGLYCERIDES: 272 mg/dL — AB (ref <150)
Total CHOL/HDL Ratio: 5.8 Ratio
VLDL: 54 mg/dL — ABNORMAL HIGH (ref 0–40)

## 2013-06-14 LAB — RPR

## 2013-06-14 LAB — VITAMIN D 25 HYDROXY (VIT D DEFICIENCY, FRACTURES): VIT D 25 HYDROXY: 108 ng/mL — AB (ref 30–89)

## 2013-06-14 LAB — HEPATITIS C ANTIBODY: HCV Ab: NEGATIVE

## 2013-06-14 LAB — IRON AND TIBC
%SAT: 43 % (ref 20–55)
IRON: 129 ug/dL (ref 42–145)
TIBC: 297 ug/dL (ref 250–470)
UIBC: 168 ug/dL (ref 125–400)

## 2013-06-14 LAB — TSH: TSH: 0.855 u[IU]/mL (ref 0.350–4.500)

## 2013-06-14 LAB — HIV ANTIBODY (ROUTINE TESTING W REFLEX): HIV: NONREACTIVE

## 2013-06-14 LAB — HEPATITIS B SURFACE ANTIBODY,QUALITATIVE: HEP B S AB: NEGATIVE

## 2013-06-14 LAB — HEPATITIS A ANTIBODY, TOTAL: Hep A Total Ab: REACTIVE — AB

## 2013-06-15 LAB — HEPATITIS B E ANTIBODY: HEPATITIS BE ANTIBODY: NONREACTIVE

## 2013-06-16 LAB — TB SKIN TEST
Induration: 0 mm
TB Skin Test: NEGATIVE

## 2013-06-19 ENCOUNTER — Ambulatory Visit (HOSPITAL_COMMUNITY)
Admission: RE | Admit: 2013-06-19 | Discharge: 2013-06-19 | Disposition: A | Discharge status: Home / Self Care | Payer: BC Managed Care – PPO | Source: Ambulatory Visit | Attending: Internal Medicine | Admitting: Internal Medicine

## 2013-06-19 DIAGNOSIS — Z1231 Encounter for screening mammogram for malignant neoplasm of breast: Secondary | ICD-10-CM | POA: Insufficient documentation

## 2013-09-20 ENCOUNTER — Ambulatory Visit: Payer: Self-pay | Admitting: Physician Assistant

## 2013-10-24 ENCOUNTER — Other Ambulatory Visit: Payer: Self-pay

## 2013-10-24 ENCOUNTER — Encounter: Payer: Self-pay | Admitting: Physician Assistant

## 2013-10-24 ENCOUNTER — Ambulatory Visit (INDEPENDENT_AMBULATORY_CARE_PROVIDER_SITE_OTHER): Payer: BC Managed Care – PPO | Admitting: Physician Assistant

## 2013-10-24 VITALS — BP 122/78 | HR 72 | Temp 97.9°F | Resp 16 | Ht 61.0 in | Wt 142.0 lb

## 2013-10-24 DIAGNOSIS — Z23 Encounter for immunization: Secondary | ICD-10-CM

## 2013-10-24 DIAGNOSIS — E559 Vitamin D deficiency, unspecified: Secondary | ICD-10-CM

## 2013-10-24 DIAGNOSIS — E782 Mixed hyperlipidemia: Secondary | ICD-10-CM

## 2013-10-24 DIAGNOSIS — R7303 Prediabetes: Secondary | ICD-10-CM

## 2013-10-24 DIAGNOSIS — R7309 Other abnormal glucose: Secondary | ICD-10-CM

## 2013-10-24 DIAGNOSIS — I10 Essential (primary) hypertension: Secondary | ICD-10-CM

## 2013-10-24 DIAGNOSIS — Z79899 Other long term (current) drug therapy: Secondary | ICD-10-CM

## 2013-10-24 LAB — CBC WITH DIFFERENTIAL/PLATELET
Basophils Absolute: 0 10*3/uL (ref 0.0–0.1)
Basophils Relative: 0 % (ref 0–1)
EOS ABS: 0.2 10*3/uL (ref 0.0–0.7)
Eosinophils Relative: 2 % (ref 0–5)
HEMATOCRIT: 42.7 % (ref 36.0–46.0)
HEMOGLOBIN: 14 g/dL (ref 12.0–15.0)
Lymphocytes Relative: 27 % (ref 12–46)
Lymphs Abs: 2.7 10*3/uL (ref 0.7–4.0)
MCH: 29.6 pg (ref 26.0–34.0)
MCHC: 32.8 g/dL (ref 30.0–36.0)
MCV: 90.3 fL (ref 78.0–100.0)
MONO ABS: 0.6 10*3/uL (ref 0.1–1.0)
MONOS PCT: 6 % (ref 3–12)
NEUTROS ABS: 6.6 10*3/uL (ref 1.7–7.7)
Neutrophils Relative %: 65 % (ref 43–77)
Platelets: 368 10*3/uL (ref 150–400)
RBC: 4.73 MIL/uL (ref 3.87–5.11)
RDW: 14.3 % (ref 11.5–15.5)
WBC: 10.1 10*3/uL (ref 4.0–10.5)

## 2013-10-24 MED ORDER — METFORMIN HCL ER 500 MG PO TB24: 500.0000 mg | ORAL_TABLET | Freq: Two times a day (BID) | ORAL | Status: DC

## 2013-10-24 MED ORDER — METFORMIN HCL ER (OSM) 500 MG PO TB24: 500.0000 mg | ORAL_TABLET | Freq: Two times a day (BID) | ORAL | Status: DC

## 2013-10-24 MED ORDER — PHENTERMINE HCL 37.5 MG PO TABS: 37.5000 mg | ORAL_TABLET | Freq: Every day | ORAL | Status: DC

## 2013-10-24 MED ORDER — PREDNISONE 20 MG PO TABS: ORAL_TABLET | ORAL | Status: DC

## 2013-10-24 MED ORDER — VALACYCLOVIR HCL 500 MG PO TABS: 500.0000 mg | ORAL_TABLET | Freq: Two times a day (BID) | ORAL | Status: DC

## 2013-10-24 NOTE — Progress Notes (Signed)
Assessment and Plan:  Hypertension: Continue medication, monitor blood pressure at home. Continue DASH diet. Cholesterol: Continue diet and exercise. Check cholesterol.  Pre-diabetes-Continue diet and exercise. Check A1C- will try metformin for weight loss and if this does not help she will try phentermine.  Vitamin D Def- check level and continue medications.   Continue diet and meds as discussed. Further disposition pending results of labs.  HPI 54 y.o. female  presents for 3 month follow up with hypertension, hyperlipidemia, prediabetes and vitamin D. Her blood pressure has been controlled at home, today their BP is BP: 122/78 mmHg She does workout, she walks over 10000 steps daily. She denies chest pain, shortness of breath, dizziness.  She is not on cholesterol medication and denies myalgias. Her cholesterol is not at goal. The cholesterol last visit was:   Lab Results  Component Value Date   CHOL 209* 06/13/2013   HDL 36* 06/13/2013   LDLCALC 119* 06/13/2013   TRIG 272* 06/13/2013   CHOLHDL 5.8 06/13/2013   She has been working on diet and exercise for prediabetes, and denies paresthesia of the feet, polydipsia and polyuria. Last A1C in the office was:  Lab Results  Component Value Date   HGBA1C 5.7* 06/13/2013   Patient is on Vitamin D supplement.   Lab Results  Component Value Date   VD25OH 108* 06/13/2013     She has asthma and travels a lot for work, she take prednisone with her to help if she has an exacerbation.  She has been very frustrated with her weight, she is very active and eats very well but continues to have weight gain.   Current Medications:  Current Outpatient Prescriptions on File Prior to Visit  Medication Sig Dispense Refill  . acyclovir ointment (ZOVIRAX) 5 % Apply 1 application topically every 3 (three) hours.       . Cholecalciferol (VITAMIN D) 2000 UNITS CAPS Take 5 capsules (10,000 Units total) by mouth daily.      Marland Kitchen EPIDUO 0.1-2.5 % gel Apply 1 application  topically as needed.       Marland Kitchen estradiol (ESTRACE) 1 MG tablet Take 1 mg by mouth daily.      Marland Kitchen lisinopril (PRINIVIL,ZESTRIL) 40 MG tablet Take 0.5 tablets (20 mg total) by mouth 2 (two) times daily.  180 tablet  2  . medroxyPROGESTERone (PROVERA) 2.5 MG tablet Take 2.5 mg by mouth daily.      . VENTOLIN HFA 108 (90 BASE) MCG/ACT inhaler as needed.       . verapamil (CALAN-SR) 180 MG CR tablet Take 0.5 tablets (90 mg total) by mouth 2 (two) times daily.  90 tablet  3   No current facility-administered medications on file prior to visit.   Medical History:  Past Medical History  Diagnosis Date  . Seasonal allergies   . Irregular periods/menstrual cycles   . Torn meniscus   . Hypertension   . Diverticula, colon   . Allergy   . Asthma    Allergies: No Known Allergies   Review of Systems: [X]  = complains of  [ ]  = denies  General: Fatigue [ ]  Fever [ ]  Chills [ ]  Weakness [ ]   Insomnia [ ]  Eyes: Redness [ ]  Blurred vision [ ]  Diplopia [ ]   ENT: Congestion [ ]  Sinus Pain [ ]  Post Nasal Drip [ ]  Sore Throat [ ]  Earache [ ]   Cardiac: Chest pain/pressure [ ]  SOB [ ]  Orthopnea [ ]   Palpitations [ ]   Paroxysmal  nocturnal dyspnea[ ]  Claudication [ ]  Edema [ ]   Pulmonary: Cough [ ]  Wheezing[ ]   SOB [ ]   Snoring [ ]   GI: Nausea [ ]  Vomiting[ ]  Dysphagia[ ]  Heartburn[ ]  Abdominal pain [ ]  Constipation [ ] ; Diarrhea [ ] ; BRBPR [ ]  Melena[ ]  GU: Hematuria[ ]  Dysuria [ ]  Nocturia[ ]  Urgency [ ]   Hesitancy [ ]  Discharge [ ]  Neuro: Headaches[ ]  Vertigo[ ]  Paresthesias[ ]  Spasm [ ]  Speech changes [ ]  Incoordination [ ]   Ortho: Arthritis [ ]  Joint pain [ ]  Muscle pain [ ]  Joint swelling [ ]  Back Pain [ ]  Skin:  Rash [ ]   Pruritis [ ]  Change in skin lesion [ ]   Psych: Depression[ ]  Anxiety[ ]  Confusion [ ]  Memory loss [ ]   Heme/Lypmh: Bleeding [ ]  Bruising [ ]  Enlarged lymph nodes [ ]   Endocrine: Visual blurring [ ]  Paresthesia [ ]  Polyuria [ ]  Polydypsea [ ]    Heat/cold intolerance [ ]  Hypoglycemia [  ]  Family history- Review and unchanged Social history- Review and unchanged Physical Exam: BP 122/78  Pulse 72  Temp(Src) 97.9 F (36.6 C)  Resp 16  Ht 5\' 1"  (1.549 m)  Wt 142 lb (64.411 kg)  BMI 26.84 kg/m2 Wt Readings from Last 3 Encounters:  10/24/13 142 lb (64.411 kg)  06/13/13 135 lb 3.2 oz (61.326 kg)  04/26/13 132 lb 12.8 oz (60.238 kg)   General Appearance: Well nourished, in no apparent distress. Eyes: PERRLA, EOMs, conjunctiva no swelling or erythema Sinuses: No Frontal/maxillary tenderness ENT/Mouth: Ext aud canals clear, TMs without erythema, bulging. No erythema, swelling, or exudate on post pharynx.  Tonsils not swollen or erythematous. Hearing normal.  Neck: Supple, thyroid normal.  Respiratory: Respiratory effort normal, BS equal bilaterally without rales, rhonchi, wheezing or stridor.  Cardio: RRR with no MRGs. Brisk peripheral pulses without edema.  Abdomen: Soft, + BS.  Non tender, no guarding, rebound, hernias, masses. Lymphatics: Non tender without lymphadenopathy.  Musculoskeletal: Full ROM, 5/5 strength, normal gait.  Skin: Warm, dry without rashes, lesions, ecchymosis.  Neuro: Cranial nerves intact. Normal muscle tone, no cerebellar symptoms. Sensation intact.  Psych: Awake and oriented X 3, normal affect, Insight and Judgment appropriate.    Vicie Mutters 11:37 AM

## 2013-10-24 NOTE — Patient Instructions (Addendum)
Your A1C is normal but your insulin is high. This can make it hard to lose weight, crave carbs but when you eat carbs you crash.  Please make sure you decrease bad carbs like white bread, white rice, potatoes, corn, soft drinks, pasta, cereals, refined sugars, sweet tea, dried fruits, and fruit juice. Good carbs are okay to eat in moderation like sweet potatoes, brown rice, whole grain pasta/bread, most fruit (expect dried fruit) and you can eat as many veggies as you want. Also sometimes we will put patients on metformin for elevated insulin. We can discuss at next visit but here is some information about metformin. We start Metformin to prevent or treat diabetes. Metformin does not cause low blood sugars. In order to create energy your cells need insulin and sugar but sometime your cells do not accept the insulin and this can cause increased sugars and decreased energy. The Metformin helps your cells accept insulin and the sugar to give you more energy.   We are starting you on Metformin to prevent or treat diabetes. Metformin does not cause low blood sugars. In order to create energy your cells need insulin and sugar but sometime your cells do not accept the insulin and this can cause increased sugars and decreased energy. The Metformin helps your cells accept insulin and the sugar to give you more energy.   The two most common side effects are nausea and diarrhea, follow these rules to avoid it! You can take imodium per box instructions when starting metformin if needed.   Rules of metformin: 1) start out slow with only one pill daily. Our goal for you is 2 pills  2) take with your largest meal. 3) Take with least amount of carbs.   Call if you have any problems.    Phentermine  While taking the medication we may ask that you come into the office once a month or once every 2-3 months to monitor your weight, blood pressure, and heart rate. In addition we can help answer your questions about diet,  exercise, and help you every step of the way with your weight loss journey. Sometime it is helpful if you bring in a food diary or use an app on your phone such as myfitnesspal to record your calorie intake, especially in the beginning.   You can start out on 1/3 to 1/2 a pill in the morning and if you are tolerating it well you can increase to one pill daily. I also have some patients that take 1/3 or 1/2 at lunch to help prevent night time eating.   What is this medicine? PHENTERMINE (FEN ter meen) decreases your appetite. This medicine is intended to be used in addition to a healthy reduced calorie diet and exercise. The best results are achieved this way. This medicine is only indicated for short-term use. Eventually your weight loss may level out and the medication will no longer be needed.   How should I use this medicine? Take this medicine by mouth. Follow the directions on the prescription label. The tablets should stay in the bottle until immediately before you take your dose. Take your doses at regular intervals. Do not take your medicine more often than directed.  Overdosage: If you think you have taken too much of this medicine contact a poison control center or emergency room at once. NOTE: This medicine is only for you. Do not share this medicine with others.  What if I miss a dose? If you miss a dose,  take it as soon as you can. If it is almost time for your next dose, take only that dose. Do not take double or extra doses. Do not increase or in any way change your dose without consulting your doctor.  What should I watch for while using this medicine? Notify your physician immediately if you become short of breath while doing your normal activities. Do not take this medicine within 6 hours of bedtime. It can keep you from getting to sleep. Avoid drinks that contain caffeine and try to stick to a regular bedtime every night. Do not stand or sit up quickly, especially if you are an  older patient. This reduces the risk of dizzy or fainting spells. Avoid alcoholic drinks.  What side effects may I notice from receiving this medicine? Side effects that you should report to your doctor or health care professional as soon as possible: -chest pain, palpitations -depression or severe changes in mood -increased blood pressure -irritability -nervousness or restlessness -severe dizziness -shortness of breath -problems urinating -unusual swelling of the legs -vomiting  Side effects that usually do not require medical attention (report to your doctor or health care professional if they continue or are bothersome): -blurred vision or other eye problems -changes in sexual ability or desire -constipation or diarrhea -difficulty sleeping -dry mouth or unpleasant taste -headache -nausea This list may not describe all possible side effects. Call your doctor for medical advice about side effects. You may report side effects to FDA at 1-800-FDA-1088.

## 2013-10-25 LAB — BASIC METABOLIC PANEL WITH GFR
BUN: 14 mg/dL (ref 6–23)
CO2: 25 meq/L (ref 19–32)
Calcium: 9 mg/dL (ref 8.4–10.5)
Chloride: 103 mEq/L (ref 96–112)
Creat: 0.8 mg/dL (ref 0.50–1.10)
GFR, Est African American: >89 mL/min
GFR, Est Non African American: 85 mL/min
GLUCOSE: 86 mg/dL (ref 70–99)
Potassium: 4.7 mEq/L (ref 3.5–5.3)
SODIUM: 137 meq/L (ref 135–145)

## 2013-10-25 LAB — HEPATIC FUNCTION PANEL
ALT: 18 U/L (ref 0–35)
AST: 17 U/L (ref 0–37)
Albumin: 3.8 g/dL (ref 3.5–5.2)
Alkaline Phosphatase: 70 U/L (ref 39–117)
BILIRUBIN DIRECT: 0.1 mg/dL (ref 0.0–0.3)
BILIRUBIN INDIRECT: 0.3 mg/dL (ref 0.2–1.2)
Total Bilirubin: 0.4 mg/dL (ref 0.2–1.2)
Total Protein: 6.4 g/dL (ref 6.0–8.3)

## 2013-10-25 LAB — LIPID PANEL
Cholesterol: 189 mg/dL (ref 0–200)
HDL: 34 mg/dL — ABNORMAL LOW (ref >39)
LDL CALC: 79 mg/dL (ref 0–99)
Total CHOL/HDL Ratio: 5.6 Ratio
Triglycerides: 382 mg/dL — ABNORMAL HIGH (ref <150)
VLDL: 76 mg/dL — AB (ref 0–40)

## 2013-10-25 LAB — INSULIN, FASTING: Insulin fasting, serum: 12.2 u[IU]/mL (ref 2.0–19.6)

## 2013-10-25 LAB — HEMOGLOBIN A1C
HEMOGLOBIN A1C: 5.8 % — AB (ref <5.7)
MEAN PLASMA GLUCOSE: 120 mg/dL — AB (ref <117)

## 2013-10-25 LAB — TSH: TSH: 0.961 u[IU]/mL (ref 0.350–4.500)

## 2013-10-25 LAB — MAGNESIUM: Magnesium: 2.1 mg/dL (ref 1.5–2.5)

## 2013-11-16 ENCOUNTER — Other Ambulatory Visit: Payer: Self-pay | Admitting: Dermatology

## 2013-11-19 ENCOUNTER — Other Ambulatory Visit: Payer: Self-pay | Admitting: Emergency Medicine

## 2013-12-04 ENCOUNTER — Other Ambulatory Visit: Payer: Self-pay | Admitting: Emergency Medicine

## 2013-12-05 ENCOUNTER — Other Ambulatory Visit: Payer: Self-pay | Admitting: Physician Assistant

## 2013-12-05 ENCOUNTER — Encounter: Payer: Self-pay | Admitting: Physician Assistant

## 2013-12-05 MED ORDER — PHENTERMINE HCL 37.5 MG PO TABS: 37.5000 mg | ORAL_TABLET | Freq: Every day | ORAL | Status: DC

## 2013-12-25 ENCOUNTER — Ambulatory Visit: Payer: Self-pay | Admitting: Internal Medicine

## 2014-01-31 ENCOUNTER — Ambulatory Visit: Payer: Self-pay | Admitting: Internal Medicine

## 2014-02-06 ENCOUNTER — Other Ambulatory Visit: Payer: Self-pay | Admitting: Internal Medicine

## 2014-02-07 ENCOUNTER — Other Ambulatory Visit: Payer: Self-pay | Admitting: Obstetrics and Gynecology

## 2014-02-09 LAB — CYTOLOGY - PAP

## 2014-02-16 ENCOUNTER — Other Ambulatory Visit: Payer: Self-pay | Admitting: Cardiovascular Disease

## 2014-03-28 ENCOUNTER — Other Ambulatory Visit: Payer: Self-pay | Admitting: Cardiovascular Disease

## 2014-04-09 ENCOUNTER — Encounter: Payer: Self-pay | Admitting: Internal Medicine

## 2014-04-09 ENCOUNTER — Ambulatory Visit (INDEPENDENT_AMBULATORY_CARE_PROVIDER_SITE_OTHER): Payer: BLUE CROSS/BLUE SHIELD | Admitting: Internal Medicine

## 2014-04-09 VITALS — BP 140/90 | HR 68 | Temp 97.7°F | Resp 16 | Ht 60.0 in | Wt 138.0 lb

## 2014-04-09 DIAGNOSIS — F411 Generalized anxiety disorder: Secondary | ICD-10-CM

## 2014-04-09 DIAGNOSIS — N644 Mastodynia: Secondary | ICD-10-CM

## 2014-04-09 DIAGNOSIS — I10 Essential (primary) hypertension: Secondary | ICD-10-CM

## 2014-04-09 MED ORDER — ALPRAZOLAM 0.5 MG PO TABS: 0.5000 mg | ORAL_TABLET | Freq: Three times a day (TID) | ORAL | Status: DC | PRN

## 2014-04-09 MED ORDER — SERTRALINE HCL 50 MG PO TABS: 50.0000 mg | ORAL_TABLET | Freq: Every day | ORAL | Status: DC

## 2014-04-09 NOTE — Progress Notes (Signed)
Subjective:    [name], female    DOB: 04-Dec-1960, 54 y.o.   MRN: [id number]  Hypertension Associated symptoms include headaches. Pertinent negatives include no chest pain, palpitations or shortness of breath.  Patient is presenting to the office for evaluation of elevated blood pressure.  She reports that her blood pressure has been approximately 140-160/85-90.  She reports that nothing has changed.  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  She reports that she has been exercising a lot lately.  She also reports that her diet is the same.  She reports some mild headaches.  She also notices some pain in her left breast.  She has had some sharp pains when she rolls over at night time.  She reports no lumps or changes in her breast.  No changes in her skin or nipple discharge.  She reports that she has had mammograms yearly.  She reports that she does have some fibrocystic breasts and [facility].  She reports some bilateral hand numbness that is associated with anxiety.  She is trying to do sleep hygiene.  She reports that her sleep is better.  She is not on phentermine.        Review of Systems  Constitutional: Negative for fever, chills and fatigue.  Eyes: Negative.   Respiratory: Negative for cough, chest tightness, shortness of breath and wheezing.   Cardiovascular: Negative for chest pain, palpitations and leg swelling.  Endocrine: Negative.   Genitourinary: Negative.   Neurological: Positive for numbness (bilateral hands intermittently) and headaches.  Psychiatric/Behavioral: Positive for sleep disturbance. The patient is nervous/anxious.        Objective:   Physical Exam  Constitutional: She is oriented to person, place, and time. She appears well-developed and well-nourished. No distress.  HENT:  Head: Normocephalic and atraumatic.  Mouth/Throat: Oropharynx is clear  and moist. No oropharyngeal exudate.  Eyes: Conjunctivae and EOM are normal. Pupils are equal, round, and reactive to light. No scleral icterus.  Neck: Normal range of motion. Neck supple. No JVD present. No thyromegaly present.  Cardiovascular: Normal rate, regular rhythm, normal heart sounds and intact distal pulses.  Exam reveals no gallop and no friction rub.   No murmur heard. Pulmonary/Chest: Effort normal and breath sounds normal. No respiratory distress. She has no wheezes. She has no rales. She exhibits no tenderness.  No lumps, nodules, or masses present on breast exam.  L > R breast tenderness to palpation. NO palpable lymph nodes.  No nipple discharge bilaterally.    Abdominal: Soft. Bowel sounds are normal. She exhibits no distension and no mass. There is no tenderness. There is no rebound and no guarding.  Musculoskeletal: Normal range of motion.  Lymphadenopathy:    She has no cervical adenopathy.  Neurological: She is alert and oriented to person, place, and time. No cranial nerve deficit. Coordination normal.  Skin: Skin is warm and dry. She is not diaphoretic.  Psychiatric: She has a normal mood and affect. Her behavior is normal. Judgment and thought content normal.  Nursing note and vitals reviewed.         Assessment & Plan:    1. Generalized anxiety disorder -think that this may be the cause of blood pressure elevation -cont relaxation techniques - sertraline (ZOLOFT) 50 MG tablet; Take 1 tablet (50 mg total) by mouth daily.  Dispense: 30 tablet; Refill:  2 - ALPRAZolam (XANAX) 0.5 MG tablet; Take 1 tablet (0.5 mg total) by mouth 3 (three) times daily as needed for sleep or anxiety.  Dispense: 90 tablet; Refill: 0 -recheck in 1 month.  2. Essential hypertension -cont medications -keep BP diary at home.  3. Mastalgia -nothing palpable on examination -history not consistent with cancer -avoid caffeine -aleve prn for pain - [facility];  Future

## 2014-04-09 NOTE — Patient Instructions (Signed)
Breast Tenderness Breast tenderness is a common problem for women of all ages. Breast tenderness may cause mild discomfort to severe pain. It has a variety of causes. Your health care provider will find out the likely cause of your breast tenderness by examining your breasts, asking you about symptoms, and ordering some tests. Breast tenderness usually does not mean you have breast cancer. HOME CARE INSTRUCTIONS  Breast tenderness often can be handled at home. You can try:  Getting fitted for a new bra that provides more support, especially during exercise.  Wearing a more supportive bra or sports bra while sleeping when your breasts are very tender.  If you have a breast injury, apply ice to the area:  Put ice in a plastic bag.  Place a towel between your skin and the bag.  Leave the ice on for 20 minutes, 2-3 times a day.  If your breasts are too full of milk as a result of breastfeeding, try:  Expressing milk either by hand or with a breast pump.  Applying a warm compress to the breasts for relief.  Taking over-the-counter pain relievers, if approved by your health care provider.  Taking other medicines that your health care provider prescribes. These may include antibiotic medicines or birth control pills. Over the long term, your breast tenderness might be eased if you:  Cut down on caffeine.  Reduce the amount of fat in your diet. Keep a log of the days and times when your breasts are most tender. This will help you and your health care provider find the cause of the tenderness and how to relieve it. Also, learn how to do breast exams at home. This will help you notice if you have an unusual growth or lump that could cause tenderness. SEEK MEDICAL CARE IF:   Any part of your breast is hard, red, and hot to the touch. This could be a sign of infection.  Fluid is coming out of your nipples (and you are not breastfeeding). Especially watch for blood or pus.  You have a fever  as well as breast tenderness.  You have a new or painful lump in your breast that remains after your menstrual period ends.  You have tried to take care of the pain at home, but it has not gone away.  Your breast pain is getting worse, or the pain is making it hard to do the things you usually do during your day. Document Released: 12/12/2007 Document Revised: 08/31/2012 Document Reviewed: 07/28/2012 [facility] Patient Information 2015 [place], [place]. This information is not intended to replace advice given to you by your health care provider. Make sure you discuss any questions you have with your health care provider.  Alprazolam tablets What is this medicine? ALPRAZOLAM (al PRAY zoe lam) is a benzodiazepine. It is used to treat anxiety and panic attacks. This medicine may be used for other purposes; ask your health care provider or pharmacist if you have questions. COMMON BRAND NAME(S): Xanax What should I tell my health care provider before I take this medicine? They need to know if you have any of these conditions: -an alcohol or drug abuse problem -bipolar disorder, depression, psychosis or other mental health conditions -glaucoma -kidney or liver disease -lung or breathing disease -myasthenia gravis -Parkinson's disease -porphyria -seizures or a history of seizures -suicidal thoughts -an unusual or allergic reaction to alprazolam, other benzodiazepines, foods, dyes, or preservatives -pregnant or trying to get pregnant -breast-feeding How should I use this medicine? Take this  medicine by mouth with a glass of water. Follow the directions on the prescription label. Take your medicine at regular intervals. Do not take it more often than directed. If you have been taking this medicine regularly for some time, do not suddenly stop taking it. You must gradually reduce the dose or you may get severe side effects. Ask your doctor or health care professional for advice. Even after you stop  taking this medicine it can still affect your body for several days. Talk to your pediatrician regarding the use of this medicine in children. Special care may be needed. Overdosage: If you think you have taken too much of this medicine contact a poison control center or emergency room at once. NOTE: This medicine is only for you. Do not share this medicine with others. What if I miss a dose? If you miss a dose, take it as soon as you can. If it is almost time for your next dose, take only that dose. Do not take double or extra doses. What may interact with this medicine? Do not take this medicine with any of the following medications: -certain medicines for HIV infection or AIDS -ketoconazole -itraconazole This medicine may also interact with the following medications: -birth control pills -certain macrolide antibiotics like clarithromycin, erythromycin, troleandomycin -cimetidine -cyclosporine -ergotamine -grapefruit juice -herbal or dietary supplements like kava kava, melatonin, dehydroepiandrosterone, DHEA, St. John's Wort or valerian -imatinib, STI-571 -isoniazid -levodopa -medicines for depression, anxiety, or psychotic disturbances -prescription pain medicines -rifampin, rifapentine, or rifabutin -some medicines for blood pressure or heart problems -some medicines for seizures like carbamazepine, oxcarbazepine, phenobarbital, phenytoin, primidone This list may not describe all possible interactions. Give your health care provider a list of all the medicines, herbs, non-prescription drugs, or dietary supplements you use. Also tell them if you smoke, drink alcohol, or use illegal drugs. Some items may interact with your medicine. What should I watch for while using this medicine? Visit your doctor or health care professional for regular checks on your progress. Your body can become dependent on this medicine. Ask your doctor or health care professional if you still need to take  it. You may get drowsy or dizzy. Do not drive, use machinery, or do anything that needs mental alertness until you know how this medicine affects you. To reduce the risk of dizzy and fainting spells, do not stand or sit up quickly, especially if you are an older patient. Alcohol may increase dizziness and drowsiness. Avoid alcoholic drinks. Do not treat yourself for coughs, colds or allergies without asking your doctor or health care professional for advice. Some ingredients can increase possible side effects. What side effects may I notice from receiving this medicine? Side effects that you should report to your doctor or health care professional as soon as possible: -allergic reactions like skin rash, itching or hives, swelling of the face, lips, or tongue -confusion, forgetfulness -depression -difficulty sleeping -difficulty speaking -feeling faint or lightheaded, falls -mood changes, excitability or aggressive behavior -muscle cramps -trouble passing urine or change in the amount of urine -unusually weak or tired Side effects that usually do not require medical attention (report to your doctor or health care professional if they continue or are bothersome): -change in sex drive or performance -changes in appetite This list may not describe all possible side effects. Call your doctor for medical advice about side effects. You may report side effects to FDA at 1-800-FDA-1088. Where should I keep my medicine? Keep out of the reach of children.  This medicine can be abused. Keep your medicine in a safe place to protect it from theft. Do not share this medicine with anyone. Selling or giving away this medicine is dangerous and against the law. Store at room temperature between 20 and 25 degrees C (68 and 77 degrees F). Throw away any unused medicine after the expiration date. NOTE: This sheet is a summary. It may not cover all possible information. If you have questions about this medicine, talk  to your doctor, pharmacist, or health care provider.  2015, [employer]. (2007-03-24 10:34:46)

## 2014-04-17 ENCOUNTER — Other Ambulatory Visit: Payer: Self-pay | Admitting: Internal Medicine

## 2014-04-17 DIAGNOSIS — N644 Mastodynia: Secondary | ICD-10-CM

## 2014-04-24 ENCOUNTER — Other Ambulatory Visit: Payer: Self-pay | Admitting: Internal Medicine

## 2014-04-24 DIAGNOSIS — N644 Mastodynia: Secondary | ICD-10-CM

## 2014-04-27 ENCOUNTER — Ambulatory Visit
Admission: RE | Admit: 2014-04-27 | Discharge: 2014-04-27 | Disposition: A | Discharge status: Home / Self Care | Payer: BLUE CROSS/BLUE SHIELD | Source: Ambulatory Visit | Attending: Internal Medicine | Admitting: Internal Medicine

## 2014-04-27 DIAGNOSIS — N644 Mastodynia: Secondary | ICD-10-CM

## 2014-05-10 ENCOUNTER — Telehealth: Payer: Self-pay | Admitting: *Deleted

## 2014-05-10 MED ORDER — NEOMYCIN-POLYMYXIN-HC OP SUSP
OPHTHALMIC | Status: DC
Start: 2014-05-10 — End: 2014-08-19

## 2014-05-10 NOTE — Telephone Encounter (Signed)
Patient called and complained of a sty in the corner of her left eye.  OK to send RX for Cortisporin opthalmic solution per Dr Melford Aase.

## 2014-05-15 ENCOUNTER — Other Ambulatory Visit: Payer: Self-pay | Admitting: Cardiovascular Disease

## 2014-05-16 NOTE — Telephone Encounter (Signed)
Please advise on refill. Patient has not been seen since 2014. Thanks, MI

## 2014-05-18 ENCOUNTER — Other Ambulatory Visit: Payer: Self-pay

## 2014-05-18 MED ORDER — LISINOPRIL 40 MG PO TABS: ORAL_TABLET | ORAL | Status: DC

## 2014-05-18 MED ORDER — VERAPAMIL HCL ER 180 MG PO TBCR
EXTENDED_RELEASE_TABLET | ORAL | Status: DC
Start: 2014-05-18 — End: 2014-08-22

## 2014-05-24 ENCOUNTER — Other Ambulatory Visit: Payer: Self-pay

## 2014-05-24 ENCOUNTER — Other Ambulatory Visit: Payer: Self-pay | Admitting: Internal Medicine

## 2014-05-24 MED ORDER — NEOMYCIN-POLYMYXIN-DEXAMETH 3.5-10000-0.1 OP SUSP: OPHTHALMIC | Status: DC

## 2014-06-19 ENCOUNTER — Encounter: Payer: Self-pay | Admitting: Internal Medicine

## 2014-07-06 ENCOUNTER — Other Ambulatory Visit: Payer: Self-pay

## 2014-07-06 DIAGNOSIS — F411 Generalized anxiety disorder: Secondary | ICD-10-CM

## 2014-07-06 MED ORDER — SERTRALINE HCL 50 MG PO TABS: 50.0000 mg | ORAL_TABLET | Freq: Every day | ORAL | Status: DC

## 2014-07-07 ENCOUNTER — Other Ambulatory Visit: Payer: Self-pay | Admitting: Internal Medicine

## 2014-07-07 DIAGNOSIS — J302 Other seasonal allergic rhinitis: Secondary | ICD-10-CM

## 2014-07-14 ENCOUNTER — Other Ambulatory Visit: Payer: Self-pay | Admitting: Internal Medicine

## 2014-08-01 ENCOUNTER — Encounter: Payer: Self-pay | Admitting: Internal Medicine

## 2014-08-18 ENCOUNTER — Emergency Department (HOSPITAL_BASED_OUTPATIENT_CLINIC_OR_DEPARTMENT_OTHER): Payer: BLUE CROSS/BLUE SHIELD

## 2014-08-18 ENCOUNTER — Encounter (HOSPITAL_BASED_OUTPATIENT_CLINIC_OR_DEPARTMENT_OTHER): Payer: Self-pay | Admitting: Emergency Medicine

## 2014-08-18 ENCOUNTER — Observation Stay (HOSPITAL_BASED_OUTPATIENT_CLINIC_OR_DEPARTMENT_OTHER)
Admission: EM | Admit: 2014-08-18 | Discharge: 2014-08-20 | Disposition: A | Discharge status: Home / Self Care | Payer: BLUE CROSS/BLUE SHIELD | Attending: Internal Medicine | Admitting: Internal Medicine

## 2014-08-18 DIAGNOSIS — Z79899 Other long term (current) drug therapy: Secondary | ICD-10-CM | POA: Diagnosis not present

## 2014-08-18 DIAGNOSIS — E785 Hyperlipidemia, unspecified: Secondary | ICD-10-CM | POA: Diagnosis not present

## 2014-08-18 DIAGNOSIS — Z8673 Personal history of transient ischemic attack (TIA), and cerebral infarction without residual deficits: Secondary | ICD-10-CM | POA: Diagnosis present

## 2014-08-18 DIAGNOSIS — Z8249 Family history of ischemic heart disease and other diseases of the circulatory system: Secondary | ICD-10-CM | POA: Diagnosis not present

## 2014-08-18 DIAGNOSIS — E876 Hypokalemia: Secondary | ICD-10-CM | POA: Insufficient documentation

## 2014-08-18 DIAGNOSIS — R072 Precordial pain: Secondary | ICD-10-CM | POA: Diagnosis not present

## 2014-08-18 DIAGNOSIS — E782 Mixed hyperlipidemia: Secondary | ICD-10-CM | POA: Diagnosis present

## 2014-08-18 DIAGNOSIS — R202 Paresthesia of skin: Secondary | ICD-10-CM | POA: Diagnosis present

## 2014-08-18 DIAGNOSIS — I5041 Acute combined systolic (congestive) and diastolic (congestive) heart failure: Secondary | ICD-10-CM | POA: Diagnosis present

## 2014-08-18 DIAGNOSIS — Z792 Long term (current) use of antibiotics: Secondary | ICD-10-CM | POA: Diagnosis not present

## 2014-08-18 DIAGNOSIS — Z79890 Hormone replacement therapy (postmenopausal): Secondary | ICD-10-CM | POA: Insufficient documentation

## 2014-08-18 DIAGNOSIS — J45909 Unspecified asthma, uncomplicated: Secondary | ICD-10-CM | POA: Diagnosis not present

## 2014-08-18 DIAGNOSIS — I454 Nonspecific intraventricular block: Secondary | ICD-10-CM | POA: Insufficient documentation

## 2014-08-18 DIAGNOSIS — I10 Essential (primary) hypertension: Secondary | ICD-10-CM | POA: Diagnosis not present

## 2014-08-18 DIAGNOSIS — R200 Anesthesia of skin: Secondary | ICD-10-CM

## 2014-08-18 DIAGNOSIS — G459 Transient cerebral ischemic attack, unspecified: Principal | ICD-10-CM | POA: Insufficient documentation

## 2014-08-18 LAB — CBC WITH DIFFERENTIAL/PLATELET
BASOS PCT: 1 % (ref 0–1)
Basophils Absolute: 0.1 10*3/uL (ref 0.0–0.1)
EOS ABS: 0.2 10*3/uL (ref 0.0–0.7)
Eosinophils Relative: 2 % (ref 0–5)
HCT: 41.6 % (ref 36.0–46.0)
Hemoglobin: 13.9 g/dL (ref 12.0–15.0)
LYMPHS ABS: 2.9 10*3/uL (ref 0.7–4.0)
Lymphocytes Relative: 28 % (ref 12–46)
MCH: 30.1 pg (ref 26.0–34.0)
MCHC: 33.4 g/dL (ref 30.0–36.0)
MCV: 90 fL (ref 78.0–100.0)
Monocytes Absolute: 0.7 10*3/uL (ref 0.1–1.0)
Monocytes Relative: 7 % (ref 3–12)
NEUTROS ABS: 6.2 10*3/uL (ref 1.7–7.7)
NEUTROS PCT: 62 % (ref 43–77)
Platelets: 352 10*3/uL (ref 150–400)
RBC: 4.62 MIL/uL (ref 3.87–5.11)
RDW: 13.5 % (ref 11.5–15.5)
WBC: 10.1 10*3/uL (ref 4.0–10.5)

## 2014-08-18 LAB — BASIC METABOLIC PANEL
ANION GAP: 12 (ref 5–15)
BUN: 13 mg/dL (ref 6–20)
CO2: 21 mmol/L — AB (ref 22–32)
Calcium: 9 mg/dL (ref 8.9–10.3)
Chloride: 106 mmol/L (ref 101–111)
Creatinine, Ser: 0.71 mg/dL (ref 0.44–1.00)
GFR calc Af Amer: >60 mL/min (ref >60)
Glucose, Bld: 114 mg/dL — ABNORMAL HIGH (ref 65–99)
Potassium: 3.4 mmol/L — ABNORMAL LOW (ref 3.5–5.1)
SODIUM: 139 mmol/L (ref 135–145)

## 2014-08-18 LAB — TROPONIN I: Troponin I: <0.03 ng/mL (ref <0.031)

## 2014-08-18 NOTE — ED Notes (Signed)
Patient is tearful and anxious in the pain  - in triage

## 2014-08-18 NOTE — ED Provider Notes (Signed)
CSN: [id number]   Arrival date & time 08/18/14 2109  History  This chart was scribed for  [name], MD by Altamease Oiler, ED Scribe. This patient was seen in room MH01/MH01 and the patient's care was started at 9:43 PM.  Chief Complaint  Patient presents with  . Chest Pain    HPI The history is provided by the patient. No language interpreter was used.   Gail Bailey is a 54 y.o. female with PMHx of HTN and bundle branch block who presents to the Emergency Department complaining of intermittent substernal chest pain with onset around 7:30 PM with light work. The pain is described as sharp and rated 7/10 in severity at worse.  Exacerbated by bending over. No relief from TUMS. Pt is chest pain free at present. Also complains of difficulty speaking ("I just can't get the words out), tingling ("pins and needles") in right upper and lower extremity, headache earlier today, and left sided facial swelling. No change in vision, dental pain, or numbness around the mouth. She denies recent emotional upset or hyperventilating. Family history of CAD and stoke(mother). Never smoker. Last cardiac catheterization was at age 68.  Past Medical History  Diagnosis Date  . Seasonal allergies   . Irregular periods/menstrual cycles   . Torn meniscus   . Hypertension   . Diverticula, colon   . Allergy   . Asthma   . Bundle branch block     Past Surgical History  Procedure Laterality Date  . Knee surgery      partial knee replacement right  . Cesarean section      x2  . Tubes in ears  10/12  . Tonsillectomy    . Breast reduction surgery    . Cardiac catheterization      Family History  Problem Relation Age of Onset  . Colon cancer Neg Hx   . Diabetes Mother   . Heart disease Mother   . Hypertension Mother   . Other Father     MVA  . Asthma Sister   . Asthma Son   . Cancer Maternal Grandmother     Uterine  . Heart attack Paternal Grandmother     History  Substance Use Topics  . Smoking  status: Never Smoker   . Smokeless tobacco: Never Used  . Alcohol Use: 3.0 oz/week    5 Glasses of wine per week     Review of Systems  Constitutional: Negative for fever, chills, diaphoresis, appetite change and fatigue.  HENT: Negative for mouth sores, sore throat and trouble swallowing.        Left facial swelling  Eyes: Negative for visual disturbance.  Respiratory: Negative for cough, chest tightness, shortness of breath and wheezing.   Cardiovascular: Positive for chest pain.  Gastrointestinal: Negative for nausea, vomiting, abdominal pain, diarrhea and abdominal distention.  Endocrine: Positive for polydipsia. Negative for polyphagia and polyuria.  Genitourinary: Negative for dysuria, frequency and hematuria.  Musculoskeletal: Negative for gait problem.  Skin: Negative for color change, pallor and rash.  Neurological: Positive for speech difficulty and headaches. Negative for dizziness, syncope and light-headedness.       Tingling  Hematological: Does not bruise/bleed easily.  Psychiatric/Behavioral: Negative for behavioral problems and confusion.     Home Medications   Prior to Admission medications   Medication Sig Start Date End Date Taking? Authorizing Provider  acyclovir ointment (ZOVIRAX) 5 % Apply 1 application topically every 3 (three) hours.  12/24/11   Historical Provider, MD  ALPRAZolam (XANAX) 0.5 MG tablet Take 1 tablet (0.5 mg total) by mouth 3 (three) times daily as needed for sleep or anxiety. 04/09/14 04/09/15  Courtney Forcucci, PA-C  EPIDUO 0.1-2.5 % gel Apply 1 application topically as needed.  08/19/12   Historical Provider, MD  estradiol (ESTRACE) 1 MG tablet Take 1 mg by mouth daily.    Historical Provider, MD  lisinopril (PRINIVIL,ZESTRIL) 40 MG tablet Take one tablet daily for blood pressure 05/18/14   Unk Pinto, MD  medroxyPROGESTERone (PROVERA) 2.5 MG tablet Take 2.5 mg by mouth daily.    Historical Provider, MD  montelukast (SINGULAIR) 10 MG  tablet TAKE 1 TABLET BY MOUTH DAILY 07/15/14   Unk Pinto, MD  neomycin-polymyxin b-dexamethasone (MAXITROL) 3.5-10000-0.1 SUSP Place 1-2 drops in affected eye 05/24/14   Unk Pinto, MD  NEOMYCIN-POLYMYXIN-HC, OPHTH, SUSP 1-2 drops to affected eye 4 times daily. 05/10/14   Unk Pinto, MD  sertraline (ZOLOFT) 50 MG tablet Take 1 tablet (50 mg total) by mouth daily. 07/06/14 07/06/15  Unk Pinto, MD  valACYclovir (VALTREX) 500 MG tablet Take 1 tablet (500 mg total) by mouth 2 (two) times daily. 10/24/13   Vicie Mutters, PA-C  VENTOLIN HFA 108 (90 BASE) MCG/ACT inhaler as needed.  01/08/12   Historical Provider, MD  verapamil (CALAN-SR) 180 MG CR tablet Take 1/2 to 1 tablet twice daily with food for blood pressure 05/18/14   Unk Pinto, MD    Allergies  Review of patient's allergies indicates no known allergies.  Triage Vitals: BP 159/83 mmHg  Pulse 82  Temp(Src) 98.2 F (36.8 C) (Oral)  Resp 20  Ht 5' (1.524 m)  Wt 130 lb (58.968 kg)  BMI 25.39 kg/m2  SpO2 99%  Physical Exam  Constitutional: She is oriented to person, place, and time. She appears well-developed and well-nourished. No distress.  HENT:  Head: Normocephalic.  Minimal soft tissue swelling over left maxilla Not erythematous Non-tender  Eyes: Conjunctivae are normal. Pupils are equal, round, and reactive to light. No scleral icterus.  Neck: Normal range of motion. Neck supple. No thyromegaly present.  Cardiovascular: Normal rate and regular rhythm.  Exam reveals no gallop and no friction rub.   No murmur heard. Pulmonary/Chest: Effort normal and breath sounds normal. No respiratory distress. She has no wheezes. She has no rales.  Abdominal: Soft. Bowel sounds are normal. She exhibits no distension. There is no tenderness. There is no rebound.  Musculoskeletal: Normal range of motion.  Neurological: She is alert and oriented to person, place, and time.  Skin: Skin is warm and dry. No rash noted.   Psychiatric: She has a normal mood and affect. Her behavior is normal.    ED Course  Procedures   DIAGNOSTIC STUDIES: Oxygen Saturation is 99% on RA, normal by my interpretation.    COORDINATION OF CARE: 9:54 PM Discussed treatment plan which includes lab work, EKG, CXR, and CT head without contrast with pt at bedside and pt agreed to plan.  Labs Review-  Labs Reviewed  BASIC METABOLIC PANEL - Abnormal; Notable for the following:    Potassium 3.4 (*)    CO2 21 (*)    Glucose, Bld 114 (*)    All other components within normal limits  CBC WITH DIFFERENTIAL/PLATELET  TROPONIN I    Imaging Review Dg Chest 2 View  08/18/2014   CLINICAL DATA:  Patient with sternal chest pain and right arm numbness.  EXAM: CHEST  2 VIEW  COMPARISON:  None.  FINDINGS: Normal cardiac and  mediastinal contours. No consolidative pulmonary opacities. No pleural effusion or pneumothorax. Regional skeleton is unremarkable.  IMPRESSION: No acute cardiopulmonary process.   Electronically Signed   By: Lovey Newcomer M.D.   On: 08/18/2014 22:28   Ct Head Wo Contrast  08/18/2014   CLINICAL DATA:  Difficulty speaking, can't get words out, tingling in RIGHT arm and leg, headache earlier, LEFT facial swelling  EXAM: CT HEAD WITHOUT CONTRAST  TECHNIQUE: Contiguous axial images were obtained from the base of the skull through the vertex without intravenous contrast.  COMPARISON:  None  FINDINGS: Normal ventricular morphology.  No midline shift or mass effect.  Normal appearance of brain parenchyma.  No intracranial hemorrhage, mass lesion or evidence acute infarction.  No extra-axial fluid collections.  Visualized paranasal sinuses and mastoid air cells clear.  Bones unremarkable.  IMPRESSION: Normal exam.   Electronically Signed   By: Lavonia Dana M.D.   On: 08/18/2014 22:37    EKG Interpretation  Date/Time:  Saturday August 18 2014 21:25:42 EDT Ventricular Rate:  80 PR Interval:  122 QRS Duration: 124 QT  Interval:  402 QTC Calculation: 463 R Axis:   -13 Text Interpretation:  Normal sinus rhythm Anteroseptal infarct , age undetermined Abnormal ECG Confirmed by Jeneen Rinks  MD, [place] ([id number]) on 08/18/2014 9:28:39 PM       MDM   Final diagnoses:  Right arm numbness   Normal CT. Discussed the case Dr. Doy Mince of neurology. As the patient is not yet completely asymptomatic she felt that transfer for MRI was indicated. I discussed the case with Dr. Verner Chol in the emergency room who accepts her in transfer to [facility].  I personally performed the services described in this documentation, which was scribed in my presence. The recorded information has been reviewed and is accurate.     [name], MD 08/18/14 [phone]

## 2014-08-18 NOTE — ED Notes (Signed)
No changes, alert, NAD, calm, interactive, to radiology via stretcher.

## 2014-08-18 NOTE — ED Notes (Signed)
Patient states that she is having right sided chest pain x 2 hours. Reports that she is having right arm numbness and right leg numbness

## 2014-08-18 NOTE — ED Notes (Signed)
Dr. Jeneen Rinks into room

## 2014-08-19 ENCOUNTER — Observation Stay (HOSPITAL_COMMUNITY): Payer: BLUE CROSS/BLUE SHIELD

## 2014-08-19 ENCOUNTER — Encounter (HOSPITAL_COMMUNITY): Payer: Self-pay | Admitting: Radiology

## 2014-08-19 ENCOUNTER — Observation Stay (HOSPITAL_BASED_OUTPATIENT_CLINIC_OR_DEPARTMENT_OTHER): Payer: BLUE CROSS/BLUE SHIELD

## 2014-08-19 ENCOUNTER — Emergency Department (HOSPITAL_COMMUNITY): Payer: BLUE CROSS/BLUE SHIELD

## 2014-08-19 DIAGNOSIS — R079 Chest pain, unspecified: Secondary | ICD-10-CM

## 2014-08-19 DIAGNOSIS — G459 Transient cerebral ischemic attack, unspecified: Secondary | ICD-10-CM

## 2014-08-19 DIAGNOSIS — Z8673 Personal history of transient ischemic attack (TIA), and cerebral infarction without residual deficits: Secondary | ICD-10-CM | POA: Diagnosis present

## 2014-08-19 DIAGNOSIS — E782 Mixed hyperlipidemia: Secondary | ICD-10-CM | POA: Diagnosis not present

## 2014-08-19 DIAGNOSIS — G451 Carotid artery syndrome (hemispheric): Secondary | ICD-10-CM | POA: Diagnosis not present

## 2014-08-19 DIAGNOSIS — I10 Essential (primary) hypertension: Secondary | ICD-10-CM | POA: Diagnosis not present

## 2014-08-19 LAB — CBC WITH DIFFERENTIAL/PLATELET
BASOS PCT: 1 % (ref 0–1)
Basophils Absolute: 0 10*3/uL (ref 0.0–0.1)
Eosinophils Absolute: 0.3 10*3/uL (ref 0.0–0.7)
Eosinophils Relative: 4 % (ref 0–5)
HCT: 40.1 % (ref 36.0–46.0)
HEMOGLOBIN: 13.2 g/dL (ref 12.0–15.0)
LYMPHS ABS: 2.2 10*3/uL (ref 0.7–4.0)
Lymphocytes Relative: 26 % (ref 12–46)
MCH: 29.4 pg (ref 26.0–34.0)
MCHC: 32.9 g/dL (ref 30.0–36.0)
MCV: 89.3 fL (ref 78.0–100.0)
MONO ABS: 0.7 10*3/uL (ref 0.1–1.0)
Monocytes Relative: 8 % (ref 3–12)
NEUTROS ABS: 5.1 10*3/uL (ref 1.7–7.7)
NEUTROS PCT: 61 % (ref 43–77)
PLATELETS: 279 10*3/uL (ref 150–400)
RBC: 4.49 MIL/uL (ref 3.87–5.11)
RDW: 13.3 % (ref 11.5–15.5)
WBC: 8.3 10*3/uL (ref 4.0–10.5)

## 2014-08-19 LAB — COMPREHENSIVE METABOLIC PANEL
ALK PHOS: 72 U/L (ref 38–126)
ALT: 18 U/L (ref 14–54)
AST: 23 U/L (ref 15–41)
Albumin: 3 g/dL — ABNORMAL LOW (ref 3.5–5.0)
Anion gap: 8 (ref 5–15)
BUN: 10 mg/dL (ref 6–20)
CO2: 24 mmol/L (ref 22–32)
Calcium: 8.8 mg/dL — ABNORMAL LOW (ref 8.9–10.3)
Chloride: 104 mmol/L (ref 101–111)
Creatinine, Ser: 0.9 mg/dL (ref 0.44–1.00)
GFR calc non Af Amer: >60 mL/min (ref >60)
Glucose, Bld: 148 mg/dL — ABNORMAL HIGH (ref 65–99)
Potassium: 3.8 mmol/L (ref 3.5–5.1)
SODIUM: 136 mmol/L (ref 135–145)
Total Bilirubin: 0.2 mg/dL — ABNORMAL LOW (ref 0.3–1.2)
Total Protein: 5.6 g/dL — ABNORMAL LOW (ref 6.5–8.1)

## 2014-08-19 LAB — PROTIME-INR
INR: 1.01 (ref 0.00–1.49)
Prothrombin Time: 13.5 seconds (ref 11.6–15.2)

## 2014-08-19 LAB — TROPONIN I: Troponin I: <0.03 ng/mL (ref <0.031)

## 2014-08-19 LAB — LIPID PANEL
Cholesterol: 211 mg/dL — ABNORMAL HIGH (ref 0–200)
HDL: 32 mg/dL — AB (ref >40)
LDL Cholesterol: UNDETERMINED mg/dL (ref 0–99)
TRIGLYCERIDES: 445 mg/dL — AB (ref <150)
Total CHOL/HDL Ratio: 6.6 RATIO
VLDL: UNDETERMINED mg/dL (ref 0–40)

## 2014-08-19 MED ORDER — ALPRAZOLAM 0.5 MG PO TABS
0.5000 mg | ORAL_TABLET | Freq: Three times a day (TID) | ORAL | Status: DC | PRN
  Administered 2014-08-20: 0.5 mg via ORAL
  Filled 2014-08-19: qty 1

## 2014-08-19 MED ORDER — ASPIRIN 325 MG PO TABS
325.0000 mg | ORAL_TABLET | Freq: Every day | ORAL | Status: DC
  Administered 2014-08-19 – 2014-08-20 (×2): 325 mg via ORAL
  Filled 2014-08-19 (×2): qty 1

## 2014-08-19 MED ORDER — ENOXAPARIN SODIUM 30 MG/0.3ML ~~LOC~~ SOLN
30.0000 mg | SUBCUTANEOUS | Status: DC
  Administered 2014-08-19 – 2014-08-20 (×2): 30 mg via SUBCUTANEOUS
  Filled 2014-08-19 (×3): qty 0.3

## 2014-08-19 MED ORDER — POTASSIUM CHLORIDE CRYS ER 20 MEQ PO TBCR
20.0000 meq | EXTENDED_RELEASE_TABLET | Freq: Two times a day (BID) | ORAL | Status: AC
  Administered 2014-08-19 – 2014-08-20 (×4): 20 meq via ORAL
  Filled 2014-08-19 (×4): qty 1

## 2014-08-19 MED ORDER — ACETAMINOPHEN 325 MG PO TABS: 650.0000 mg | ORAL_TABLET | ORAL | Status: DC | PRN

## 2014-08-19 MED ORDER — ASPIRIN 300 MG RE SUPP: 300.0000 mg | Freq: Every day | RECTAL | Status: DC

## 2014-08-19 MED ORDER — VERAPAMIL HCL ER 180 MG PO TBCR
180.0000 mg | EXTENDED_RELEASE_TABLET | Freq: Every day | ORAL | Status: DC
  Administered 2014-08-19 – 2014-08-20 (×2): 180 mg via ORAL
  Filled 2014-08-19 (×2): qty 1

## 2014-08-19 MED ORDER — IOHEXOL 350 MG/ML SOLN
50.0000 mL | Freq: Once | INTRAVENOUS | Status: AC | PRN
  Administered 2014-08-19: 50 mL via INTRAVENOUS

## 2014-08-19 MED ORDER — SERTRALINE HCL 50 MG PO TABS
50.0000 mg | ORAL_TABLET | Freq: Every day | ORAL | Status: DC
  Administered 2014-08-19 – 2014-08-20 (×2): 50 mg via ORAL
  Filled 2014-08-19 (×2): qty 1

## 2014-08-19 MED ORDER — ACETAMINOPHEN 650 MG RE SUPP: 650.0000 mg | RECTAL | Status: DC | PRN

## 2014-08-19 MED ORDER — SENNOSIDES-DOCUSATE SODIUM 8.6-50 MG PO TABS: 1.0000 | ORAL_TABLET | Freq: Every evening | ORAL | Status: DC | PRN

## 2014-08-19 MED ORDER — STROKE: EARLY STAGES OF RECOVERY BOOK
Freq: Once | Status: AC
  Administered 2014-08-19: 1

## 2014-08-19 MED ORDER — LISINOPRIL 20 MG PO TABS
40.0000 mg | ORAL_TABLET | Freq: Every day | ORAL | Status: DC
Start: 2014-08-19 — End: 2014-08-21
  Administered 2014-08-19 – 2014-08-20 (×2): 40 mg via ORAL
  Filled 2014-08-19: qty 4
  Filled 2014-08-19 (×2): qty 2

## 2014-08-19 NOTE — ED Notes (Signed)
MRI called for transport. 

## 2014-08-19 NOTE — ED Notes (Signed)
Dr. Horton at the bedside.  

## 2014-08-19 NOTE — H&P (Signed)
[facility] History and Physical  Patient: Gail Bailey  MRN: [id number]  DOB: 10-22-1960  DOS: the patient was seen and examined on 08/19/2014 PCP: Alesia Richards, MD  Referring physician: Dr. Dina Rich Chief Complaint: Right-sided numbness speech difficulty  HPI: [name] is a 54 y.o. female with Past medical history of hypertension, left bundle branch block. The patient is presenting with complaints of right-sided numbness and weakness as well as slurred speech. She also had some numbness of chest pain. Patient was at home and suddenly started having numbness of substernal chest pain the pain was sharp stabbing and was worsening with deep breath as well as worsening with bending over. The pain did not improve with tums. After this the patient started having complains of difficulty with speaking and also difficulty with her right upper extremity later on started having some numbness and weakness of the right lower extended 20 as well. She denies any headache dizziness lightheadedness. No nausea no vomiting no diarrhea no constipation no burning urination. No shortness of breath cough or fever. No recent procedure but she does have recent travel history.  The patient is coming from home.  At her baseline ambulates without any support And is independent for most of her ADL manages her medication on her own.  Review of Systems: as mentioned in the history of present illness.  A comprehensive review of the other systems is negative.  Past Medical History  Diagnosis Date  . Seasonal allergies   . Irregular periods/menstrual cycles   . Torn meniscus   . Hypertension   . Diverticula, colon   . Allergy   . Asthma   . Bundle branch block    Past Surgical History  Procedure Laterality Date  . Knee surgery      partial knee replacement right  . Cesarean section      x2  . Tubes in ears  10/12  . Tonsillectomy    . Breast reduction surgery    . Cardiac  catheterization     Social History:  reports that she has never smoked. She has never used smokeless tobacco. She reports that she drinks about 3.0 oz of alcohol per week. She reports that she does not use illicit drugs.  No Known Allergies  Family History  Problem Relation Age of Onset  . Colon cancer Neg Hx   . Diabetes Mother   . Heart disease Mother   . Hypertension Mother   . Other Father     MVA  . Asthma Sister   . Asthma Son   . Cancer Maternal Grandmother     Uterine  . Heart attack Paternal Grandmother     Prior to Admission medications   Medication Sig Start Date End Date Taking? Authorizing Provider  ALPRAZolam Duanne Moron) 0.5 MG tablet Take 1 tablet (0.5 mg total) by mouth 3 (three) times daily as needed for sleep or anxiety. 04/09/14 04/09/15 Yes Courtney Forcucci, PA-C  cetirizine (ZYRTEC) 10 MG tablet Take 10 mg by mouth daily as needed for allergies.   Yes Historical Provider, MD  Cholecalciferol (VITAMIN D PO) Take 1 tablet by mouth daily.   Yes Historical Provider, MD  EPIDUO 0.1-2.5 % gel Apply 1 application topically as needed (acne).  08/19/12  Yes Historical Provider, MD  estradiol (ESTRACE) 1 MG tablet Take 2 mg by mouth daily.    Yes Historical Provider, MD  lisinopril (PRINIVIL,ZESTRIL) 40 MG tablet Take one tablet daily for blood pressure 05/18/14  Yes Unk Pinto,  MD  medroxyPROGESTERone (PROVERA) 2.5 MG tablet Take 5 mg by mouth daily.    Yes Historical Provider, MD  montelukast (SINGULAIR) 10 MG tablet TAKE 1 TABLET BY MOUTH DAILY 07/15/14  Yes Unk Pinto, MD  Omega-3 Fatty Acids (FISH OIL PO) Take 1 capsule by mouth daily.   Yes Historical Provider, MD  Red Yeast Rice Extract (RED YEAST RICE PO) Take 1 tablet by mouth daily.   Yes Historical Provider, MD  sertraline (ZOLOFT) 50 MG tablet Take 1 tablet (50 mg total) by mouth daily. 07/06/14 07/06/15 Yes Unk Pinto, MD  valACYclovir (VALTREX) 500 MG tablet Take 1 tablet (500 mg total) by mouth 2 (two)  times daily. Patient taking differently: Take 500 mg by mouth 2 (two) times daily as needed (fever blisters).  10/24/13  Yes Vicie Mutters, PA-C  VENTOLIN HFA 108 (90 BASE) MCG/ACT inhaler Inhale 1-2 puffs into the lungs every 6 (six) hours as needed for wheezing or shortness of breath.  01/08/12  Yes Historical Provider, MD  verapamil (CALAN-SR) 180 MG CR tablet Take 1/2 to 1 tablet twice daily with food for blood pressure Patient taking differently: Take 180 mg by mouth at bedtime.  05/18/14  Yes Unk Pinto, MD    Physical Exam: Filed Vitals:   08/19/14 0015 08/19/14 0117 08/19/14 0323 08/19/14 0415  BP: 129/62 174/87 142/60 153/80  Pulse: 82 82 74 73  Temp:  98.3 F (36.8 C) 98.2 F (36.8 C) 97.5 F (36.4 C)  TempSrc:  Oral Oral Oral  Resp:  18 17 16   Height:      Weight:      SpO2: 95% 97% 97% 100%    General: Alert, Awake and Oriented to Time, Place and Person. Appear in mild distress Eyes: PERRL ENT: Oral Mucosa clear moist. Neck: no JVD Cardiovascular: S1 and S2 Present, no Murmur, Peripheral Pulses Present Respiratory: Bilateral Air entry equal and Decreased,  Clear to Auscultation, no Crackles, no wheezes Abdomen: Bowel Sound  present , Soft and nono tender Skin: no Rash Extremities: no Pedal edema, no calf tenderness Neurologic: Grossly no focal neuro deficit.  Labs on Admission:  CBC:  Recent Labs Lab 08/18/14 2200  WBC 10.1  NEUTROABS 6.2  HGB 13.9  HCT 41.6  MCV 90.0  PLT 352    CMP     Component Value Date/Time   NA 139 08/18/2014 2200   K 3.4* 08/18/2014 2200   CL 106 08/18/2014 2200   CO2 21* 08/18/2014 2200   GLUCOSE 114* 08/18/2014 2200   BUN 13 08/18/2014 2200   CREATININE 0.71 08/18/2014 2200   CREATININE 0.80 10/24/2013 1202   CALCIUM 9.0 08/18/2014 2200   PROT 6.4 10/24/2013 1202   ALBUMIN 3.8 10/24/2013 1202   AST 17 10/24/2013 1202   ALT 18 10/24/2013 1202   ALKPHOS 70 10/24/2013 1202   BILITOT 0.4 10/24/2013 1202    GFRNONAA >60 08/18/2014 2200   GFRNONAA 85 10/24/2013 1202   GFRAA >60 08/18/2014 2200   GFRAA >89 10/24/2013 1202    No results for input(s): LIPASE, AMYLASE in the last 168 hours.   Recent Labs Lab 08/18/14 2200  TROPONINI <0.03   BNP (last 3 results) No results for input(s): BNP in the last 8760 hours.  ProBNP (last 3 results) No results for input(s): PROBNP in the last 8760 hours.   Radiological Exams on Admission: Dg Chest 2 View  08/18/2014   CLINICAL DATA:  Patient with sternal chest pain and right arm numbness.  EXAM: CHEST  2 VIEW  COMPARISON:  None.  FINDINGS: Normal cardiac and mediastinal contours. No consolidative pulmonary opacities. No pleural effusion or pneumothorax. Regional skeleton is unremarkable.  IMPRESSION: No acute cardiopulmonary process.   Electronically Signed   By: Lovey Newcomer M.D.   On: 08/18/2014 22:28   Ct Head Wo Contrast  08/18/2014   CLINICAL DATA:  Difficulty speaking, can't get words out, tingling in RIGHT arm and leg, headache earlier, LEFT facial swelling  EXAM: CT HEAD WITHOUT CONTRAST  TECHNIQUE: Contiguous axial images were obtained from the base of the skull through the vertex without intravenous contrast.  COMPARISON:  None  FINDINGS: Normal ventricular morphology.  No midline shift or mass effect.  Normal appearance of brain parenchyma.  No intracranial hemorrhage, mass lesion or evidence acute infarction.  No extra-axial fluid collections.  Visualized paranasal sinuses and mastoid air cells clear.  Bones unremarkable.  IMPRESSION: Normal exam.   Electronically Signed   By: Lavonia Dana M.D.   On: 08/18/2014 22:37   Mr Brain Wo Contrast  08/19/2014   CLINICAL DATA:  RIGHT chest pain for 2 hours, RIGHT extremity numbness. History of hypertension.  EXAM: MRI HEAD WITHOUT CONTRAST  TECHNIQUE: Multiplanar, multiecho pulse sequences of the brain and surrounding structures were obtained without intravenous contrast.  COMPARISON:  CT head August 18, 2014  and MRA head August 20, 2011  FINDINGS: The ventricles and sulci are normal for patient's age. No abnormal parenchymal signal, mass lesions, mass effect. No reduced diffusion to suggest acute ischemia. No susceptibility artifact to suggest hemorrhage.  No abnormal extra-axial fluid collections. No extra-axial masses though, contrast enhanced sequences would be more sensitive. Normal major intracranial vascular flow voids seen at the skull base.  Ocular globes and orbital contents are unremarkable though not tailored for evaluation. No abnormal sellar expansion. Trace paranasal sinus mucosal thickening without air-fluid levels. Mastoid air cells are well aerated. No suspicious calvarial bone marrow signal. No abnormal sellar expansion. Craniocervical junction maintained. Moderate RIGHT, severe LEFT temporomandibular osteoarthrosis.  IMPRESSION: Normal noncontrast MRI of the brain.   Electronically Signed   By: Elon Alas M.D.   On: 08/19/2014 02:30   EKG: Independently reviewed. normal sinus rhythm, nonspecific ST and T waves changes.  Assessment/Plan Principal Problem:   TIA (transient ischemic attack) Active Problems:   HTN   Hyperlipidemia   1. TIA (transient ischemic attack) The patient is presenting with complaints of right-sided numbness. CT of the head is negative. MRI is also negative. Neurology has evaluated the patient and recommended patient to be further evaluated. Patient is currently placed on stroke protocol. We will follow neurology recommendation.  2. chest pain. We will follow serial troponin and echo program in the morning. Patient currently chest pain-free. Aspirin.  3.Hypertension. Continuing home medications.  4.Mood disorder. Continuing Zoloft.  5. Hormone replacement therapy. Holding the therapies.  Advance goals of care discussion: Full code    Consults: neurology   DVT Prophylaxis: subcutaneous Heparin Nutrition: Nothing by mouth pending stroke  evaluation   Disposition: Admitted as observation, telemetry unit.  Author: Berle Mull, MD [facility] Pager: [phone] 08/19/2014  If 7PM-7AM, please contact night-coverage [email or web address] Password [email or web address]

## 2014-08-19 NOTE — Progress Notes (Signed)
STROKE TEAM PROGRESS NOTE   HISTORY Gail Bailey is a 54 y.o. female who reports that yesterday evening while doing some light work around the house she began to have chest pain. She also noted difficulty speaking, in that she could not get the words out and has tingling in the right arm and leg. Patient presented for evaluation at that time. Speech deficits improved fairly quickly but paresthesias lasted for hours. They have completely resolved at this time. Was tearful on presentation.   Date last known well: Date: 08/18/2014 Time last known well: Time: 19:00 tPA Given: No: Resolution of symptoms   SUBJECTIVE (INTERVAL HISTORY) No family members present. The patient feels she is back to baseline. She also reports that she has been on as an urgent therapy for hot flashes. Dr. Leonie Man explained that this was a stroke risk factor.   OBJECTIVE Temp:  [97.5 F (36.4 C)-98.3 F (36.8 C)] 97.9 F (36.6 C) (08/07 0615) Pulse Rate:  [64-84] 66 (08/07 0615) Cardiac Rhythm:  [-] Normal sinus rhythm (08/07 0615) Resp:  [16-20] 16 (08/07 0615) BP: (129-174)/(60-87) 136/68 mmHg (08/07 0615) SpO2:  [95 %-100 %] 100 % (08/07 0615) Weight:  [58.968 kg (130 lb)] 58.968 kg (130 lb) (08/06 2130)  No results for input(s): GLUCAP in the last 168 hours.  Recent Labs Lab 08/18/14 2200  NA 139  K 3.4*  CL 106  CO2 21*  GLUCOSE 114*  BUN 13  CREATININE 0.71  CALCIUM 9.0   No results for input(s): AST, ALT, ALKPHOS, BILITOT, PROT, ALBUMIN in the last 168 hours.  Recent Labs Lab 08/18/14 2200  WBC 10.1  NEUTROABS 6.2  HGB 13.9  HCT 41.6  MCV 90.0  PLT 352    Recent Labs Lab 08/18/14 2200  TROPONINI <0.03   No results for input(s): LABPROT, INR in the last 72 hours. No results for input(s): COLORURINE, LABSPEC, [place], GLUCOSEU, HGBUR, BILIRUBINUR, KETONESUR, PROTEINUR, UROBILINOGEN, NITRITE, LEUKOCYTESUR in the last 72 hours.  Invalid input(s): APPERANCEUR     Component  Value Date/Time   CHOL 189 10/24/2013 1202   TRIG 382* 10/24/2013 1202   HDL 34* 10/24/2013 1202   CHOLHDL 5.6 10/24/2013 1202   VLDL 76* 10/24/2013 1202   LDLCALC 79 10/24/2013 1202   Lab Results  Component Value Date   HGBA1C 5.8* 10/24/2013   No results found for: LABOPIA, COCAINSCRNUR, LABBENZ, AMPHETMU, THCU, LABBARB  No results for input(s): ETH in the last 168 hours.   Imaging   Dg Chest 2 View 08/18/2014    No acute cardiopulmonary process.     Ct Head Wo Contrast 08/18/2014    Normal exam.    Mr Brain Wo Contrast 08/19/2014    Normal noncontrast MRI of the brain.     CTA of the head and neck 08/19/2014 Unremarkable CTA head and neck.   PHYSICAL EXAM  Pleasant middle-aged Caucasian lady currently not in distress. Afebrile. Head is nontraumatic. Neck is supple without bruit.    Cardiac exam no murmur or gallop. Lungs are clear to auscultation. Distal pulses are well felt.  Neurological Exam ;  Awake  Alert oriented x 3. Normal speech and language.eye movements full without nystagmus.fundi were not visualized. Vision acuity and fields appear normal. Hearing is normal. Palatal movements are normal. Face symmetric. Tongue midline. Normal strength, tone, reflexes and coordination. Normal sensation. Gait deferred.       ASSESSMENT/PLAN Ms. Gail Bailey is a 54 y.o. female with history of hypertension, asthma, bundle  branch block, and estrogen therapy presenting with chest pain, difficulty speaking, and  tingling in the right hand arm and leg. She did not receive IV t-PA due to resolution of deficits.  Possible TIA:  Non- Dominant from unknown etiology.  Resultant  resolution of deficits  MRI  normal  CTA head and neck - unremarkable  Carotid Doppler  refer to the CTA of the neck  2D Echo pending  LDL pending  HgbA1c pending  Lovenox for VTE prophylaxis  Diet Heart Room service appropriate?: Yes; Fluid consistency:: Thin  no antithrombotic prior to  admission, now on aspirin 325 mg orally every day  Patient counseled to be compliant with her antithrombotic medications  Ongoing aggressive stroke risk factor management  Therapy recommendations: Pending  Disposition: Pending  Hypertension  Home meds:  Lisinopril 40 mg daily and verapamil 180 mg daily  Stable  Patient counseled to be compliant with her blood pressure medications  Hyperlipidemia  Home meds:  No lipid lowering medications prior to admission  LDL pending, goal < 70    Continue statin at discharge  Diabetes  HgbA1c pending, goal < 7.0  No history of diabetes  Other Stroke Risk Factors  ETOH use  Estrogen / progesterone therapy   Other Active Problems  Mild hypokalemia - supplement  Elevated Triglycerides in the past  Other Pertinent History  EKG - 08/18/2011 - NSR rate 80 bpm - computer read as anteroseptal infarct - age undetermined  The patient has seen Dr. Acie Fredrickson in the past for hypertension. EKG 2014 per Dr Acie Fredrickson showed intraventricular  conduction delay. No significant change since then.  Hospital day #   Mikey Bussing PA-C [facility] Pager [phone] 08/19/2014, 3:20 PM  I have personally examined this patient, reviewed notes, independently viewed imaging studies, participated in medical decision making and plan of care. I have made any additions or clarifications directly to the above note. Agree with note above. She has presented with a TIA but remains at risk for recurrent stroke, TIA neurological worsening and needs ongoing stroke evaluation and aggressive risk factor modification. Continue aspirin. Discussed with patient and family and answered questions Antony Contras, MD Medical Director [place] Pager: [phone] 08/20/2014 1:52 PM    To contact Stroke Continuity provider, please refer to [email or web address] After hours, contact General Neurology

## 2014-08-19 NOTE — ED Provider Notes (Signed)
Patient transferred from [facility] for MRI with symptoms concerning for TIA.  Reports transient symptoms of word finding difficulty, tingling in the right upper and lower extremities, headache. Also reports chest pain. Currently asymptomatic. MRI pending.  3:03 AM MRI negative. Discussed with Dr. Doy Mince. Patient to be admitted for TIA workup by the hospitalist.  Results for orders placed or performed during the hospital encounter of 08/18/14  CBC with Differential/Platelet  Result Value Ref Range   WBC 10.1 4.0 - 10.5 K/uL   RBC 4.62 3.87 - 5.11 MIL/uL   Hemoglobin 13.9 12.0 - 15.0 g/dL   HCT 41.6 36.0 - 46.0 %   MCV 90.0 78.0 - 100.0 fL   MCH 30.1 26.0 - 34.0 pg   MCHC 33.4 30.0 - 36.0 g/dL   RDW 13.5 11.5 - 15.5 %   Platelets 352 150 - 400 K/uL   Neutrophils Relative % 62 43 - 77 %   Neutro Abs 6.2 1.7 - 7.7 K/uL   Lymphocytes Relative 28 12 - 46 %   Lymphs Abs 2.9 0.7 - 4.0 K/uL   Monocytes Relative 7 3 - 12 %   Monocytes Absolute 0.7 0.1 - 1.0 K/uL   Eosinophils Relative 2 0 - 5 %   Eosinophils Absolute 0.2 0.0 - 0.7 K/uL   Basophils Relative 1 0 - 1 %   Basophils Absolute 0.1 0.0 - 0.1 K/uL  Basic metabolic panel  Result Value Ref Range   Sodium 139 135 - 145 mmol/L   Potassium 3.4 (L) 3.5 - 5.1 mmol/L   Chloride 106 101 - 111 mmol/L   CO2 21 (L) 22 - 32 mmol/L   Glucose, Bld 114 (H) 65 - 99 mg/dL   BUN 13 6 - 20 mg/dL   Creatinine, Ser 0.71 0.44 - 1.00 mg/dL   Calcium 9.0 8.9 - 10.3 mg/dL   GFR calc non Af Amer >60 >60 mL/min   GFR calc Af Amer >60 >60 mL/min   Anion gap 12 5 - 15  Troponin I  Result Value Ref Range   Troponin I <0.03 <0.031 ng/mL   Dg Chest 2 View  08/18/2014   CLINICAL DATA:  Patient with sternal chest pain and right arm numbness.  EXAM: CHEST  2 VIEW  COMPARISON:  None.  FINDINGS: Normal cardiac and mediastinal contours. No consolidative pulmonary opacities. No pleural effusion or pneumothorax. Regional skeleton is unremarkable.  IMPRESSION: No  acute cardiopulmonary process.   Electronically Signed   By: Lovey Newcomer M.D.   On: 08/18/2014 22:28   Ct Head Wo Contrast  08/18/2014   CLINICAL DATA:  Difficulty speaking, can't get words out, tingling in RIGHT arm and leg, headache earlier, LEFT facial swelling  EXAM: CT HEAD WITHOUT CONTRAST  TECHNIQUE: Contiguous axial images were obtained from the base of the skull through the vertex without intravenous contrast.  COMPARISON:  None  FINDINGS: Normal ventricular morphology.  No midline shift or mass effect.  Normal appearance of brain parenchyma.  No intracranial hemorrhage, mass lesion or evidence acute infarction.  No extra-axial fluid collections.  Visualized paranasal sinuses and mastoid air cells clear.  Bones unremarkable.  IMPRESSION: Normal exam.   Electronically Signed   By: Lavonia Dana M.D.   On: 08/18/2014 22:37   Mr Brain Wo Contrast  08/19/2014   CLINICAL DATA:  RIGHT chest pain for 2 hours, RIGHT extremity numbness. History of hypertension.  EXAM: MRI HEAD WITHOUT CONTRAST  TECHNIQUE: Multiplanar, multiecho pulse sequences of the brain  and surrounding structures were obtained without intravenous contrast.  COMPARISON:  CT head August 18, 2014 and MRA head August 20, 2011  FINDINGS: The ventricles and sulci are normal for patient's age. No abnormal parenchymal signal, mass lesions, mass effect. No reduced diffusion to suggest acute ischemia. No susceptibility artifact to suggest hemorrhage.  No abnormal extra-axial fluid collections. No extra-axial masses though, contrast enhanced sequences would be more sensitive. Normal major intracranial vascular flow voids seen at the skull base.  Ocular globes and orbital contents are unremarkable though not tailored for evaluation. No abnormal sellar expansion. Trace paranasal sinus mucosal thickening without air-fluid levels. Mastoid air cells are well aerated. No suspicious calvarial bone marrow signal. No abnormal sellar expansion. Craniocervical  junction maintained. Moderate RIGHT, severe LEFT temporomandibular osteoarthrosis.  IMPRESSION: Normal noncontrast MRI of the brain.   Electronically Signed   By: Elon Alas M.D.   On: 08/19/2014 02:30      Merryl Hacker, MD 08/19/14 [phone]

## 2014-08-19 NOTE — Consult Note (Signed)
Referring Physician: Jeneen Rinks    Chief Complaint: Right sided numbness, difficulty with speech  HPI: Gail Bailey is an 54 y.o. female who reports that yesterday evening while doing some light work around the house she began to have chest pain.  She also noted difficulty speaking, in that she could not get the words out and has tingling in the right arm and leg.  Patient presented for evaluation at that time.  Speech deficits improved fairly quickly but paresthesias lasted for hours.  They have completely resolved at this time.   Was tearful on presentation.    Date last known well: Date: 08/18/2014 Time last known well: Time: 19:00 tPA Given: No: Resolution of symptoms  Past Medical History  Diagnosis Date  . Seasonal allergies   . Irregular periods/menstrual cycles   . Torn meniscus   . Hypertension   . Diverticula, colon   . Allergy   . Asthma   . Bundle branch block     Past Surgical History  Procedure Laterality Date  . Knee surgery      partial knee replacement right  . Cesarean section      x2  . Tubes in ears  10/12  . Tonsillectomy    . Breast reduction surgery    . Cardiac catheterization      Family History  Problem Relation Age of Onset  . Colon cancer Neg Hx   . Diabetes Mother   . Heart disease Mother   . Hypertension Mother   . Other Father     MVA  . Asthma Sister   . Asthma Son   . Cancer Maternal Grandmother     Uterine  . Heart attack Paternal Grandmother    Social History:  reports that she has never smoked. She has never used smokeless tobacco. She reports that she drinks about 3.0 oz of alcohol per week. She reports that she does not use illicit drugs.  Allergies: No Known Allergies  Medications:  I have reviewed the patient's current medications. Prior to Admission:  Prescriptions prior to admission  Medication Sig Dispense Refill Last Dose  . ALPRAZolam (XANAX) 0.5 MG tablet Take 1 tablet (0.5 mg total) by mouth 3 (three) times daily as  needed for sleep or anxiety. 90 tablet 0 unk  . cetirizine (ZYRTEC) 10 MG tablet Take 10 mg by mouth daily as needed for allergies.   08/18/2014 at Unknown time  . Cholecalciferol (VITAMIN D PO) Take 1 tablet by mouth daily.   08/17/2014  . EPIDUO 0.1-2.5 % gel Apply 1 application topically as needed (acne).    couple months  . estradiol (ESTRACE) 1 MG tablet Take 2 mg by mouth daily.    08/17/2014  . lisinopril (PRINIVIL,ZESTRIL) 40 MG tablet Take one tablet daily for blood pressure 90 tablet 1 08/17/2014  . medroxyPROGESTERone (PROVERA) 2.5 MG tablet Take 5 mg by mouth daily.    08/17/2014  . montelukast (SINGULAIR) 10 MG tablet TAKE 1 TABLET BY MOUTH DAILY 90 tablet 1 08/17/2014  . Omega-3 Fatty Acids (FISH OIL PO) Take 1 capsule by mouth daily.   08/17/2014  . Red Yeast Rice Extract (RED YEAST RICE PO) Take 1 tablet by mouth daily.   08/17/2014  . sertraline (ZOLOFT) 50 MG tablet Take 1 tablet (50 mg total) by mouth daily. 30 tablet 3 08/17/2014  . valACYclovir (VALTREX) 500 MG tablet Take 1 tablet (500 mg total) by mouth 2 (two) times daily. (Patient taking differently: Take 500 mg by  mouth 2 (two) times daily as needed (fever blisters). ) 60 tablet 3 6 months  . VENTOLIN HFA 108 (90 BASE) MCG/ACT inhaler Inhale 1-2 puffs into the lungs every 6 (six) hours as needed for wheezing or shortness of breath.    6 months  . verapamil (CALAN-SR) 180 MG CR tablet Take 1/2 to 1 tablet twice daily with food for blood pressure (Patient taking differently: Take 180 mg by mouth at bedtime. ) 180 tablet 1 08/17/2014   Scheduled:  ROS: History obtained from the patient  General ROS: negative for - chills, fatigue, fever, night sweats, weight gain or weight loss Psychological ROS: negative for - behavioral disorder, hallucinations, memory difficulties, mood swings or suicidal ideation Ophthalmic ROS: negative for - blurry vision, double vision, eye pain or loss of vision ENT ROS: negative for - epistaxis, nasal discharge,  oral lesions, sore throat, tinnitus or vertigo Allergy and Immunology ROS: negative for - hives or itchy/watery eyes Hematological and Lymphatic ROS: negative for - bleeding problems, bruising or swollen lymph nodes Endocrine ROS: negative for - galactorrhea, hair pattern changes, polydipsia/polyuria or temperature intolerance Respiratory ROS: negative for - cough, hemoptysis, shortness of breath or wheezing Cardiovascular ROS: as noted in HPI Gastrointestinal ROS: negative for - abdominal pain, diarrhea, hematemesis, nausea/vomiting or stool incontinence Genito-Urinary ROS: negative for - dysuria, hematuria, incontinence or urinary frequency/urgency Musculoskeletal ROS: negative for - joint swelling or muscular weakness Neurological ROS: as noted in HPI Dermatological ROS: negative for rash and skin lesion changes  Physical Examination: Blood pressure 153/80, pulse 73, temperature 97.5 F (36.4 C), temperature source Oral, resp. rate 16, height 5' (1.524 m), weight 58.968 kg (130 lb), SpO2 100 %.  HEENT-  Normocephalic, no lesions, without obvious abnormality.  Normal external eye and conjunctiva.  Normal TM's bilaterally.  Normal auditory canals and external ears. Normal external nose, mucus membranes and septum.  Normal pharynx. Cardiovascular- S1, S2 normal, pulses palpable throughout   Lungs- chest clear, no wheezing, rales, normal symmetric air entry Abdomen- soft, non-tender; bowel sounds normal; no masses,  no organomegaly Extremities- no edema Lymph-no adenopathy palpable Musculoskeletal-no joint tenderness, deformity or swelling Skin-warm and dry, no hyperpigmentation, vitiligo, or suspicious lesions  Neurological Examination Mental Status: Alert, oriented, thought content appropriate.  Speech fluent without evidence of aphasia.  Able to follow 3 step commands without difficulty. Cranial Nerves: II: Discs flat bilaterally; Visual fields grossly normal, pupils equal, round,  reactive to light and accommodation III,IV, VI: ptosis not present, extra-ocular motions intact bilaterally V,VII: smile symmetric, facial light touch sensation normal bilaterally VIII: hearing normal bilaterally IX,X: gag reflex present XI: bilateral shoulder shrug XII: midline tongue extension Motor: Right : Upper extremity   5/5    Left:     Upper extremity   5/5  Lower extremity   5/5     Lower extremity   5/5 Tone and bulk:normal tone throughout; no atrophy noted Sensory: Pinprick and light touch intact throughout, bilaterally Deep Tendon Reflexes: 2+ and symmetric throughout Plantars: Right: downgoing   Left: downgoing Cerebellar: normal finger-to-nose, normal rapid alternating movements and normal heel-to-shin test Gait: normal gait and station      Laboratory Studies:  Basic Metabolic Panel:  Recent Labs Lab 08/18/14 2200  NA 139  K 3.4*  CL 106  CO2 21*  GLUCOSE 114*  BUN 13  CREATININE 0.71  CALCIUM 9.0    Liver Function Tests: No results for input(s): AST, ALT, ALKPHOS, BILITOT, PROT, ALBUMIN in the last 168 hours.  No results for input(s): LIPASE, AMYLASE in the last 168 hours. No results for input(s): AMMONIA in the last 168 hours.  CBC:  Recent Labs Lab 08/18/14 2200  WBC 10.1  NEUTROABS 6.2  HGB 13.9  HCT 41.6  MCV 90.0  PLT 352    Cardiac Enzymes:  Recent Labs Lab 08/18/14 2200  TROPONINI <0.03    BNP: Invalid input(s): POCBNP  CBG: No results for input(s): GLUCAP in the last 168 hours.  Microbiology: Results for orders placed or performed in visit on 03/16/13  Stool culture     Status: None   Collection Time: 03/17/13 10:57 AM  Result Value Ref Range Status   Organism ID, Bacteria No Salmonella,Shigella,Campylobacter,Yersinia,or  Final    Comment: Reduced Normal Flora present   Organism ID, Bacteria No E.coli [id number] isolated.  Final   Organism ID, Bacteria Abundant Yeast  Final  Ova and parasite examination     Status:  None   Collection Time: 03/17/13 10:57 AM  Result Value Ref Range Status   OP No Ova or Parasites Seen   Final   OP Abundant  Final   OP Yeast  Final    Coagulation Studies: No results for input(s): LABPROT, INR in the last 72 hours.  Urinalysis: No results for input(s): COLORURINE, LABSPEC, PHURINE, GLUCOSEU, HGBUR, BILIRUBINUR, KETONESUR, PROTEINUR, UROBILINOGEN, NITRITE, LEUKOCYTESUR in the last 168 hours.  Invalid input(s): APPERANCEUR  Lipid Panel:    Component Value Date/Time   CHOL 189 10/24/2013 1202   TRIG 382* 10/24/2013 1202   HDL 34* 10/24/2013 1202   CHOLHDL 5.6 10/24/2013 1202   VLDL 76* 10/24/2013 1202   LDLCALC 79 10/24/2013 1202    HgbA1C:  Lab Results  Component Value Date   HGBA1C 5.8* 10/24/2013    Urine Drug Screen:  No results found for: LABOPIA, COCAINSCRNUR, LABBENZ, AMPHETMU, THCU, LABBARB  Alcohol Level: No results for input(s): ETH in the last 168 hours.  Other results: EKG: normal sinus rhythm at 80 bpm.  Imaging: Dg Chest 2 View  08/18/2014   CLINICAL DATA:  Patient with sternal chest pain and right arm numbness.  EXAM: CHEST  2 VIEW  COMPARISON:  None.  FINDINGS: Normal cardiac and mediastinal contours. No consolidative pulmonary opacities. No pleural effusion or pneumothorax. Regional skeleton is unremarkable.  IMPRESSION: No acute cardiopulmonary process.   Electronically Signed   By: Lovey Newcomer M.D.   On: 08/18/2014 22:28   Ct Head Wo Contrast  08/18/2014   CLINICAL DATA:  Difficulty speaking, can't get words out, tingling in RIGHT arm and leg, headache earlier, LEFT facial swelling  EXAM: CT HEAD WITHOUT CONTRAST  TECHNIQUE: Contiguous axial images were obtained from the base of the skull through the vertex without intravenous contrast.  COMPARISON:  None  FINDINGS: Normal ventricular morphology.  No midline shift or mass effect.  Normal appearance of brain parenchyma.  No intracranial hemorrhage, mass lesion or evidence acute infarction.   No extra-axial fluid collections.  Visualized paranasal sinuses and mastoid air cells clear.  Bones unremarkable.  IMPRESSION: Normal exam.   Electronically Signed   By: Lavonia Dana M.D.   On: 08/18/2014 22:37   Mr Brain Wo Contrast  08/19/2014   CLINICAL DATA:  RIGHT chest pain for 2 hours, RIGHT extremity numbness. History of hypertension.  EXAM: MRI HEAD WITHOUT CONTRAST  TECHNIQUE: Multiplanar, multiecho pulse sequences of the brain and surrounding structures were obtained without intravenous contrast.  COMPARISON:  CT head August 18, 2014 and MRA head  August 20, 2011  FINDINGS: The ventricles and sulci are normal for patient's age. No abnormal parenchymal signal, mass lesions, mass effect. No reduced diffusion to suggest acute ischemia. No susceptibility artifact to suggest hemorrhage.  No abnormal extra-axial fluid collections. No extra-axial masses though, contrast enhanced sequences would be more sensitive. Normal major intracranial vascular flow voids seen at the skull base.  Ocular globes and orbital contents are unremarkable though not tailored for evaluation. No abnormal sellar expansion. Trace paranasal sinus mucosal thickening without air-fluid levels. Mastoid air cells are well aerated. No suspicious calvarial bone marrow signal. No abnormal sellar expansion. Craniocervical junction maintained. Moderate RIGHT, severe LEFT temporomandibular osteoarthrosis.  IMPRESSION: Normal noncontrast MRI of the brain.   Electronically Signed   By: Elon Alas M.D.   On: 08/19/2014 02:30    Assessment: 54 y.o. female presenting with a transient episode of difficulty with speech and right sided paresthesias.  Symptoms have completely resolved.  Head CT and MRI of the brain independently reviewed and show no acute changes.  Concern is for TIA.  Patient on no antiplatelet therapy at home.  Further work up recommended.    Stroke Risk Factors - hypertension  Plan: 1. HgbA1c, fasting lipid panel 2.  Echocardiogram 3. Carotid dopplers 4. Prophylactic therapy-Antiplatelet med: Aspirin - dose 325mg  daily 5. NPO until RN stroke swallow screen 6. Telemetry monitoring 7. Frequent neuro checks  Case discussed with Dr. Sallyanne Kuster, MD [facility] [phone] 08/19/2014, 5:57 AM

## 2014-08-19 NOTE — Progress Notes (Signed)
Patient admitted to the hospital earlier this morning by Dr. Posey Pronto  Patient seen and examined. She has been admitted with transient parasthesias in left arm and dysarthria. Seen by neurology and felt to possibly have TIA. She is undergoing further work up. She has been started on aspirin. She does not have any focal deficits at this time. Further orders per TIA order set.   Gail Bailey

## 2014-08-19 NOTE — Progress Notes (Signed)
Patient had CTA of neck 08/19/14. Please advise if carotid duplex still needed.  Thank you

## 2014-08-19 NOTE — ED Notes (Signed)
Pt up to b/r, steady gait. No changes.

## 2014-08-19 NOTE — Progress Notes (Signed)
  Echocardiogram 2D Echocardiogram has been performed.  Bobbye Charleston 08/19/2014, 2:22 PM

## 2014-08-19 NOTE — Progress Notes (Signed)
EMMI program presented. Patient accepts services and signed consent. Order entered.

## 2014-08-20 ENCOUNTER — Ambulatory Visit (INDEPENDENT_AMBULATORY_CARE_PROVIDER_SITE_OTHER): Payer: BLUE CROSS/BLUE SHIELD | Admitting: Internal Medicine

## 2014-08-20 ENCOUNTER — Encounter (HOSPITAL_COMMUNITY): Payer: Self-pay | Admitting: Internal Medicine

## 2014-08-20 DIAGNOSIS — R202 Paresthesia of skin: Secondary | ICD-10-CM | POA: Diagnosis not present

## 2014-08-20 DIAGNOSIS — G458 Other transient cerebral ischemic attacks and related syndromes: Secondary | ICD-10-CM

## 2014-08-20 DIAGNOSIS — I5041 Acute combined systolic (congestive) and diastolic (congestive) heart failure: Secondary | ICD-10-CM

## 2014-08-20 DIAGNOSIS — G451 Carotid artery syndrome (hemispheric): Secondary | ICD-10-CM | POA: Diagnosis not present

## 2014-08-20 DIAGNOSIS — R69 Illness, unspecified: Secondary | ICD-10-CM

## 2014-08-20 DIAGNOSIS — E782 Mixed hyperlipidemia: Secondary | ICD-10-CM

## 2014-08-20 DIAGNOSIS — I10 Essential (primary) hypertension: Secondary | ICD-10-CM

## 2014-08-20 LAB — CBC WITH DIFFERENTIAL/PLATELET
BASOS PCT: 0 % (ref 0–1)
Basophils Absolute: 0 10*3/uL (ref 0.0–0.1)
EOS ABS: 0.3 10*3/uL (ref 0.0–0.7)
Eosinophils Relative: 3 % (ref 0–5)
HCT: 42.8 % (ref 36.0–46.0)
HEMOGLOBIN: 14.2 g/dL (ref 12.0–15.0)
LYMPHS ABS: 2.5 10*3/uL (ref 0.7–4.0)
LYMPHS PCT: 27 % (ref 12–46)
MCH: 30 pg (ref 26.0–34.0)
MCHC: 33.2 g/dL (ref 30.0–36.0)
MCV: 90.5 fL (ref 78.0–100.0)
Monocytes Absolute: 0.7 10*3/uL (ref 0.1–1.0)
Monocytes Relative: 8 % (ref 3–12)
NEUTROS ABS: 5.9 10*3/uL (ref 1.7–7.7)
Neutrophils Relative %: 62 % (ref 43–77)
Platelets: 303 10*3/uL (ref 150–400)
RBC: 4.73 MIL/uL (ref 3.87–5.11)
RDW: 13.3 % (ref 11.5–15.5)
WBC: 9.5 10*3/uL (ref 4.0–10.5)

## 2014-08-20 LAB — COMPREHENSIVE METABOLIC PANEL
ALBUMIN: 3.1 g/dL — AB (ref 3.5–5.0)
ALT: 18 U/L (ref 14–54)
AST: 21 U/L (ref 15–41)
Alkaline Phosphatase: 73 U/L (ref 38–126)
Anion gap: 9 (ref 5–15)
BUN: 11 mg/dL (ref 6–20)
CO2: 26 mmol/L (ref 22–32)
CREATININE: 0.88 mg/dL (ref 0.44–1.00)
Calcium: 8.3 mg/dL — ABNORMAL LOW (ref 8.9–10.3)
Chloride: 102 mmol/L (ref 101–111)
GFR calc non Af Amer: >60 mL/min (ref >60)
GLUCOSE: 107 mg/dL — AB (ref 65–99)
Potassium: 3.8 mmol/L (ref 3.5–5.1)
SODIUM: 137 mmol/L (ref 135–145)
Total Bilirubin: 0.6 mg/dL (ref 0.3–1.2)
Total Protein: 5.9 g/dL — ABNORMAL LOW (ref 6.5–8.1)

## 2014-08-20 LAB — MAGNESIUM: MAGNESIUM: 2 mg/dL (ref 1.7–2.4)

## 2014-08-20 LAB — HEMOGLOBIN A1C
Hgb A1c MFr Bld: 5.6 % (ref 4.8–5.6)
Mean Plasma Glucose: 114 mg/dL

## 2014-08-20 MED ORDER — ATORVASTATIN CALCIUM 40 MG PO TABS
40.0000 mg | ORAL_TABLET | Freq: Every day | ORAL | Status: DC
  Administered 2014-08-20: 40 mg via ORAL
  Filled 2014-08-20: qty 1

## 2014-08-20 MED ORDER — ATORVASTATIN CALCIUM 40 MG PO TABS: 40.0000 mg | ORAL_TABLET | Freq: Every day | ORAL | Status: DC

## 2014-08-20 NOTE — Discharge Summary (Addendum)
Physician Discharge Summary  Gail Bailey IWP:[id number] DOB: March 15, 1960 DOA: 08/18/2014  PCP: Gail Richards, MD  Admit date: 08/18/2014 Discharge date: 08/20/2014  Time spent: 60 minutes  Recommendations for Outpatient Follow-up:  Per Gail Bailey, note 8/8;  Patient was diagnosed with possible left brain TIA and started on aspirin 325 mg daily.Follow-up Stroke Clinic at [facility] with Dr. Antony Bailey in 2 months, order placed     Possible left brain TIA (recommendations per Dr.Pramod Clydene Bailey, (stroke team)   Resultant resolution of deficits  MRI normal  CTA head and neck - unremarkable  Carotid Doppler refer to the CTA of the neck  2D Echo 40% to 45%.Severe hypokinesis of the apicalanteroseptal and apicalmyocardium.  LDL unable to calculate  HgbA1c 5.6, WNL  Lovenox for VTE prophylaxis  Diet Heart Room service appropriate?: Yes; Fluid consistency:: Thin  no antithrombotic prior to admission, now on aspirin 325 mg orally every day  Patient counseled to be compliant with her antithrombotic medications  Ongoing aggressive stroke risk factor management  Therapy recommendations: NO PT, no ST  Disposition: return home. Ok for discharge from stroke standpoint  Patient has a 10-15% risk of having another stroke over the next year, the highest risk is within 2 weeks of the most recent stroke/TIA (risk of having a stroke following a stroke or TIA is the same).  Stroke Service will sign off. Please call should any needs arise.  Follow-up Stroke Clinic at [facility] with Dr. Antony Bailey in 2 months, order placed.   Other Stroke Risk Factors  ETOH use  Estrogen / progesterone therapy - pt counseled to stop products with estrogen. Ok to continue progesterone only if needed  Reinforced Dr. Antony Bailey statement that both control pills with transfusion needed to be discontinued    Per Dr Gail Bailey Acute Systolic and diastolic CHF -Per patient had never been advised of heart problems in the past, even when seeing Dr. Grayland Bailey (cardiology). Therefore must assume has occurred since patient last saw Dr. Lamount Bailey in 2014 -Counseled to arrange follow-up within 1-2 weeks with Dr. Grayland Bailey (cardiology) -Continue lisinopril 40 mg daily -Continue verapamil 180 mg daily; NOTE, since patient is being discharged will not make drastic changes. Would recommend verapamil be DC'd and a beta blocker started per AHA guidelines for CHF.  Hypertension -See acute systolic and diastolic   HLD -Discharge on Lipitor 40 mg daily -Counseled patient and family that this would not be enough to bring her within [facility] guidelines will need to titrate up. -In addition may need to add niacin for hypertriglyceridemia -Follow-up with Dr. Grayland Bailey (cardiology) and PCP to titrate medication     Discharge Diagnoses:  Principal Problem:   TIA (transient ischemic attack) Active Problems:   HTN   Hyperlipidemia   Right arm numbness   Systolic and diastolic CHF, acute   Discharge Condition: Stable   Diet recommendation: Heart healthy  Filed Weights   08/18/14 2130  Weight: 58.968 kg (130 lb)    History of present illness:  Gail Bailey is a 54 y.o. female who reports that yesterday evening while doing some light work around the house she began to have chest pain. She also noted difficulty speaking, in that she could not get the words out and has tingling in the right arm and leg. Patient presented for evaluation at that time. Speech deficits improved fairly quickly but paresthesias lasted for hours. They have completely resolved at  this time. Was tearful on presentation. During hospitalization patient was worked up for TIA vs stroke. CT of the head and neck was unremarkable. However when counseling patient and family on the findings of all diagnostic tests they had not been  informed the patient had systolic and diastolic CHF by any physician since admission. Patient had been informed that her lipids were above the AHA guidelines, as well as the increased risk of being on hormone replacement therapy. Patient is stated she had a cardiologist but had not seen any significant amount of time, and the finding of systolic and diastolic heart failure was not communicated to her during her admission until the time of discharge.      Procedures: 8/7 Mr Brain Wo Contrast -Normal noncontrast MRI of the brain.  8/7 CTA of the head and neck -Unremarkable CTA head and neck. 8/7 2D Echocardiogram  - Left ventricle: 40% to 45%.Severe hypokinesis of the apicalanteroseptal and apicalmyocardium. -(grade 2 diastolic dysfunction).    Consultations: Dr.Pramod Clydene Bailey, (stroke team)       Discharge Exam: Filed Vitals:   08/19/14 2200 08/20/14 0154 08/20/14 0552 08/20/14 1003  BP: 152/72 115/57 118/47 130/61  Pulse: 67 67 60 69  Temp:  98.2 F (36.8 C) 97.8 F (36.6 C) 98.7 F (37.1 C)  TempSrc: Oral Oral Oral Oral  Resp: 20 18 19 18   Height:      Weight:      SpO2: 99% 98% 97% 99%    Neurological Exam per Dr.Pramod Clydene Bailey, (stroke team);  Awake Alert oriented x 3. Normal speech and language.eye movements full without nystagmus.fundi were not visualized. Vision acuity and fields appear normal. Hearing is normal. Palatal movements are normal. Face symmetric. Tongue midline. Normal strength, tone, reflexes and coordination. Normal sensation. Gait deferred.  Discharge Instructions      Discharge Instructions    Ambulatory referral to Neurology    Complete by:  As directed   Please schedule post stroke follow up in 2 months.            Medication List    STOP taking these medications        estradiol 1 MG tablet  Commonly known as:  ESTRACE     medroxyPROGESTERone 2.5 MG tablet  Commonly known as:  PROVERA      TAKE these medications         ALPRAZolam 0.5 MG tablet  Commonly known as:  XANAX  Take 1 tablet (0.5 mg total) by mouth 3 (three) times daily as needed for sleep or anxiety.     atorvastatin 40 MG tablet  Commonly known as:  LIPITOR  Take 1 tablet (40 mg total) by mouth daily at 6 PM.     cetirizine 10 MG tablet  Commonly known as:  ZYRTEC  Take 10 mg by mouth daily as needed for allergies.     EPIDUO 0.1-2.5 % gel  Generic drug:  Adapalene-Benzoyl Peroxide  Apply 1 application topically as needed (acne).     FISH OIL PO  Take 1 capsule by mouth daily.     lisinopril 40 MG tablet  Commonly known as:  PRINIVIL,ZESTRIL  Take one tablet daily for blood pressure     montelukast 10 MG tablet  Commonly known as:  SINGULAIR  TAKE 1 TABLET BY MOUTH DAILY     RED YEAST RICE PO  Take 1 tablet by mouth daily.     sertraline 50 MG tablet  Commonly known as:  ZOLOFT  Take 1 tablet (50 mg total) by mouth daily.     valACYclovir 500 MG tablet  Commonly known as:  VALTREX  Take 1 tablet (500 mg total) by mouth 2 (two) times daily.     VENTOLIN HFA 108 (90 BASE) MCG/ACT inhaler  Generic drug:  albuterol  Inhale 1-2 puffs into the lungs every 6 (six) hours as needed for wheezing or shortness of breath.     verapamil 180 MG CR tablet  Commonly known as:  CALAN-SR  Take 1/2 to 1 tablet twice daily with food for blood pressure     VITAMIN D PO  Take 1 tablet by mouth daily.       No Known Allergies Follow-up Information    Follow up with SETHI,PRAMOD, MD In 2 months.   Specialties:  Neurology, Radiology   Why:  Stroke Clinic, Office will call you with appointment date & time   Contact information:   [place] [address] [phone]       Follow up with Nahser, Wonda Cheng, MD. Schedule an appointment as soon as possible for a visit in 7 days.   Specialty:  Cardiology   Why:  Schedule follow-up appointment with Dr. Lamount Bailey (cardiology) within 7-14 days acute systolic  and diastolic CHF   Contact information:   [address] [place] [address] [phone]       Follow up with Gail Richards, MD.   Specialty:  Internal Medicine   Why:  Scheduled follow-up appointment with Dr. Unk Pinto within 14 days for acute systolic and diastolic CHF, HLD, HTN, discuss D/Cing HRT   Contact information:   [address] [place] [address] [phone]        The results of significant diagnostics from this hospitalization (including imaging, microbiology, ancillary and laboratory) are listed below for reference.    Significant Diagnostic Studies: Ct Angio Head W/cm &/or Wo Cm  08/19/2014   CLINICAL DATA:  Transient paresthesias in LEFT arm and dysarthria. Possible TIA.  EXAM: CT ANGIOGRAPHY HEAD AND NECK  TECHNIQUE: Multidetector CT imaging of the head and neck was performed using the standard protocol during bolus administration of intravenous contrast. Multiplanar CT image reconstructions and MIPs were obtained to evaluate the vascular anatomy. Carotid stenosis measurements (when applicable) are obtained utilizing NASCET criteria, using the distal internal carotid diameter as the denominator.  CONTRAST:  72mL OMNIPAQUE IOHEXOL 350 MG/ML SOLN  COMPARISON:  MR brain earlier today.  CT head 08/18/2014.  FINDINGS: CT HEAD  Calvarium and skull base: No fracture or destructive lesion. Mastoids and middle ears are grossly clear.  Paranasal sinuses: Imaged portions are clear.  Orbits: Negative.  Brain: No evidence of acute abnormality, including acute infarct, hemorrhage, hydrocephalus, or mass lesion.  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 Imaged portion shows no evidence of aneurysm or dissection. No significant stenosis of the major arch vessel origins.  Right carotid system: No evidence of dissection, stenosis (50% or  greater) or occlusion.  Left carotid system: Minor atheromatous change the bifurcation. No evidence of dissection, stenosis (50% or greater) or occlusion.  Vertebral arteries: RIGHT vertebral slightly larger. No evidence of dissection, stenosis (50% or greater) or occlusion.  Soft tissues: Spondylosis. No worrisome osseous lesions. Tiny RIGHT thyroid lobe cyst of no significance. Salivary glands unremarkable. No adenopathy. Airway midline. Lung apices clear.  CTA HEAD  Anterior circulation: No significant stenosis, proximal occlusion, aneurysm, or vascular malformation.  Posterior circulation: No significant stenosis, proximal occlusion, aneurysm, or vascular malformation.  Venous sinuses: As permitted by contrast timing, patent.  Anatomic variants: Fetal origin LEFT PCA.  Delayed phase:   No abnormal intracranial enhancement.  IMPRESSION: Unremarkable CTA head and neck.   Electronically Signed   By: Staci Righter M.D.   On: 08/19/2014 11:56   Dg Chest 2 View  08/18/2014   CLINICAL DATA:  Patient with sternal chest pain and right arm numbness.  EXAM: CHEST  2 VIEW  COMPARISON:  None.  FINDINGS: Normal cardiac and mediastinal contours. No consolidative pulmonary opacities. No pleural effusion or pneumothorax. Regional skeleton is unremarkable.  IMPRESSION: No acute cardiopulmonary process.   Electronically Signed   By: Lovey Newcomer M.D.   On: 08/18/2014 22:28   Ct Head Wo Contrast  08/18/2014   CLINICAL DATA:  Difficulty speaking, can't get words out, tingling in RIGHT arm and leg, headache earlier, LEFT facial swelling  EXAM: CT HEAD WITHOUT CONTRAST  TECHNIQUE: Contiguous axial images were obtained from the base of the skull through the vertex without intravenous contrast.  COMPARISON:  None  FINDINGS: Normal ventricular morphology.  No midline shift or mass effect.  Normal appearance of brain parenchyma.  No intracranial hemorrhage, mass lesion or evidence acute infarction.  No extra-axial fluid collections.   Visualized paranasal sinuses and mastoid air cells clear.  Bones unremarkable.  IMPRESSION: Normal exam.   Electronically Signed   By: Lavonia Dana M.D.   On: 08/18/2014 22:37   Ct Angio Neck W/cm &/or Wo/cm  08/19/2014   CLINICAL DATA:  Transient paresthesias in LEFT arm and dysarthria. Possible TIA.  EXAM: CT ANGIOGRAPHY HEAD AND NECK  TECHNIQUE: Multidetector CT imaging of the head and neck was performed using the standard protocol during bolus administration of intravenous contrast. Multiplanar CT image reconstructions and MIPs were obtained to evaluate the vascular anatomy. Carotid stenosis measurements (when applicable) are obtained utilizing NASCET criteria, using the distal internal carotid diameter as the denominator.  CONTRAST:  66mL OMNIPAQUE IOHEXOL 350 MG/ML SOLN  COMPARISON:  MR brain earlier today.  CT head 08/18/2014.  FINDINGS: CT HEAD  Calvarium and skull base: No fracture or destructive lesion. Mastoids and middle ears are grossly clear.  Paranasal sinuses: Imaged portions are clear.  Orbits: Negative.  Brain: No evidence of acute abnormality, including acute infarct, hemorrhage, hydrocephalus, or mass lesion.  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 Imaged portion shows no evidence of aneurysm or dissection. No significant stenosis of the major arch vessel origins.  Right carotid system: No evidence of dissection, stenosis (50% or greater) or occlusion.  Left carotid system: Minor atheromatous change the bifurcation. No evidence of dissection, stenosis (50% or greater) or occlusion.  Vertebral arteries: RIGHT vertebral slightly larger. No evidence of dissection, stenosis (50% or greater) or occlusion.  Soft tissues: Spondylosis. No worrisome osseous lesions. Tiny RIGHT thyroid lobe cyst of no significance. Salivary glands unremarkable. No adenopathy. Airway midline. Lung apices clear.  CTA HEAD   Anterior circulation: No significant stenosis, proximal occlusion, aneurysm, or vascular malformation.  Posterior circulation: No significant stenosis, proximal occlusion, aneurysm, or vascular malformation.  Venous sinuses: As permitted by contrast timing, patent.  Anatomic variants: Fetal origin LEFT PCA.  Delayed phase:   No abnormal intracranial enhancement.  IMPRESSION: Unremarkable CTA head and neck.  Electronically Signed   By: Staci Righter M.D.   On: 08/19/2014 11:56   Mr Brain Wo Contrast  08/19/2014   CLINICAL DATA:  RIGHT chest pain for 2 hours, RIGHT extremity numbness. History of hypertension.  EXAM: MRI HEAD WITHOUT CONTRAST  TECHNIQUE: Multiplanar, multiecho pulse sequences of the brain and surrounding structures were obtained without intravenous contrast.  COMPARISON:  CT head August 18, 2014 and MRA head August 20, 2011  FINDINGS: The ventricles and sulci are normal for patient's age. No abnormal parenchymal signal, mass lesions, mass effect. No reduced diffusion to suggest acute ischemia. No susceptibility artifact to suggest hemorrhage.  No abnormal extra-axial fluid collections. No extra-axial masses though, contrast enhanced sequences would be more sensitive. Normal major intracranial vascular flow voids seen at the skull base.  Ocular globes and orbital contents are unremarkable though not tailored for evaluation. No abnormal sellar expansion. Trace paranasal sinus mucosal thickening without air-fluid levels. Mastoid air cells are well aerated. No suspicious calvarial bone marrow signal. No abnormal sellar expansion. Craniocervical junction maintained. Moderate RIGHT, severe LEFT temporomandibular osteoarthrosis.  IMPRESSION: Normal noncontrast MRI of the brain.   Electronically Signed   By: Elon Alas M.D.   On: 08/19/2014 02:30    Microbiology: No results found for this or any previous visit (from the past 240 hour(s)).   Labs: Basic Metabolic Panel:  Recent Labs Lab  08/18/14 2200 08/19/14 0805 08/20/14 0756  NA 139 136 137  K 3.4* 3.8 3.8  CL 106 104 102  CO2 21* 24 26  GLUCOSE 114* 148* 107*  BUN 13 10 11   CREATININE 0.71 0.90 0.88  CALCIUM 9.0 8.8* 8.3*  MG  --   --  2.0   Liver Function Tests:  Recent Labs Lab 08/19/14 0805 08/20/14 0756  AST 23 21  ALT 18 18  ALKPHOS 72 73  BILITOT 0.2* 0.6  PROT 5.6* 5.9*  ALBUMIN 3.0* 3.1*   No results for input(s): LIPASE, AMYLASE in the last 168 hours. No results for input(s): AMMONIA in the last 168 hours. CBC:  Recent Labs Lab 08/18/14 2200 08/19/14 0805 08/20/14 0756  WBC 10.1 8.3 9.5  NEUTROABS 6.2 5.1 5.9  HGB 13.9 13.2 14.2  HCT 41.6 40.1 42.8  MCV 90.0 89.3 90.5  PLT 352 279 303   Cardiac Enzymes:  Recent Labs Lab 08/18/14 2200 08/19/14 0805 08/19/14 1218  TROPONINI <0.03 <0.03 <0.03   BNP: BNP (last 3 results) No results for input(s): BNP in the last 8760 hours.  ProBNP (last 3 results) No results for input(s): PROBNP in the last 8760 hours.  CBG: No results for input(s): GLUCAP in the last 168 hours.     Signed:  Dia Crawford, MD [facility] [phone] pager

## 2014-08-20 NOTE — Evaluation (Signed)
Physical Therapy Evaluation Patient Details Name: Gail Bailey MRN: [id number] DOB: 11-01-1960 Today's Date: 08/20/2014   History of Present Illness  Patient is a 54 y/o female admitted with right sided numbness and speech difficulty. Head CT, MRI - unremarkable. Admitted with concern for TIA. PMH includes HTN, bundle branch block and asthma.  Clinical Impression  Patient presents close to functional baseline and able to tolerate ambulating community distances without LOB or difficulty while performing higher level balance challenges. Pt reports all symptoms have resolved. Education provided on signs/symptoms of CVA. Pt does not require further skilled therapy services. Discharge from therapy.     Follow Up Recommendations No PT follow up    Equipment Recommendations  None recommended by PT    Recommendations for Other Services       Precautions / Restrictions Precautions Precautions: None Restrictions Weight Bearing Restrictions: No      Mobility  Bed Mobility Overal bed mobility: Independent                Transfers Overall transfer level: Independent                  Ambulation/Gait Ambulation/Gait assistance: Independent Ambulation Distance (Feet): 550 Feet Assistive device: None Gait Pattern/deviations: WFL(Within Functional Limits)   Gait velocity interpretation: at or above normal speed for age/gender General Gait Details: Steady gait even with higher level balance challenges.   Stairs Stairs: Yes Stairs assistance: Independent Stair Management: Alternating pattern;No rails Number of Stairs: 13 General stair comments: Safe and steady.  Wheelchair Mobility    Modified Rankin (Stroke Patients Only) Modified Rankin (Stroke Patients Only) Pre-Morbid Rankin Score: No symptoms Modified Rankin: No symptoms     Balance Overall balance assessment: Needs assistance Sitting-balance support: Feet supported;No upper extremity supported Sitting  balance-Leahy Scale: Normal     Standing balance support: During functional activity Standing balance-Leahy Scale: Good               High level balance activites: Backward walking;Direction changes;Turns;Sudden stops;Head turns High Level Balance Comments: Tolerated above higher level balance challenges without deviations in gait or LOB.  Standardized Balance Assessment Standardized Balance Assessment : Dynamic Gait Index   Dynamic Gait Index Level Surface: Normal Change in Gait Speed: Normal Gait with Horizontal Head Turns: Normal Gait with Vertical Head Turns: Normal Gait and Pivot Turn: Normal Step Over Obstacle: Normal Step Around Obstacles: Normal Steps: Normal Total Score: 24       Pertinent Vitals/Pain Pain Assessment: No/denies pain    Home Living Family/patient expects to be discharged to:: Private residence Living Arrangements: Spouse/significant other;Children   Type of Home: House Home Access: Stairs to enter   Technical brewer of Steps: 2 Home Layout: One level Home Equipment: None      Prior Function Level of Independence: Independent               Hand Dominance        Extremity/Trunk Assessment   Upper Extremity Assessment: Defer to OT evaluation;Overall WFL for tasks assessed           Lower Extremity Assessment: Overall WFL for tasks assessed         Communication   Communication: No difficulties  Cognition Arousal/Alertness: Awake/alert Behavior During Therapy: WFL for tasks assessed/performed Overall Cognitive Status: Within Functional Limits for tasks assessed                      General Comments  Exercises        Assessment/Plan    PT Assessment Patent does not need any further PT services  PT Diagnosis     PT Problem List    PT Treatment Interventions     PT Goals (Current goals can be found in the Care Plan section) Acute Rehab PT Goals PT Goal Formulation: All assessment and  education complete, DC therapy    Frequency     Barriers to discharge        Co-evaluation               End of Session Equipment Utilized During Treatment: Gait belt Activity Tolerance: Patient tolerated treatment well Patient left: in bed;with call bell/phone within reach Nurse Communication: Mobility status    Functional Assessment Tool Used: Clinical judgment Functional Limitation: Mobility: Walking and moving around Mobility: Walking and Moving Around Current Status [phone]): 0 percent impaired, limited or restricted Mobility: Walking and Moving Around Goal Status [phone]): 0 percent impaired, limited or restricted Mobility: Walking and Moving Around Discharge Status [phone]): 0 percent impaired, limited or restricted    Time: 0920-0932 PT Time Calculation (min) (ACUTE ONLY): 12 min   Charges:   PT Evaluation $Initial PT Evaluation Tier I: 1 Procedure     PT G Codes:   PT G-Codes **NOT FOR INPATIENT CLASS** Functional Assessment Tool Used: Clinical judgment Functional Limitation: Mobility: Walking and moving around Mobility: Walking and Moving Around Current Status (K8638): 0 percent impaired, limited or restricted Mobility: Walking and Moving Around Goal Status (T7711): 0 percent impaired, limited or restricted Mobility: Walking and Moving Around Discharge Status [phone]): 0 percent impaired, limited or restricted    Dutchess Crosland A Yisrael Obryan 08/20/2014, 9:56 AM  Wray Kearns, PT, DPT [phone]

## 2014-08-20 NOTE — Progress Notes (Signed)
[name], female   DOB: 02/15/1960, 54 y.o.   MRN: [id number] R E S C H E D U L E D    -   I N    H O S P I T A L

## 2014-08-20 NOTE — Progress Notes (Signed)
Received call from patient regarding DC progress.  I spoke to MD directly who assured me that she would be seen within an hour.  Spoke to the pt directly and relayed information.  Educated pt that she was cleared by neurology earlier in the day and that the attending physician would need to actually discharge her.  She and family voiced understanding.

## 2014-08-20 NOTE — Evaluation (Addendum)
Speech Language Pathology Evaluation Patient Details Name: Gail Bailey MRN: [id number] DOB: 02/20/1960 Today's Date: 08/20/2014 Time: 1000-1020 SLP Time Calculation (min) (ACUTE ONLY): 20 min  Problem List:  Patient Active Problem List   Diagnosis Date Noted  . Stroke-like symptom 08/19/2014  . TIA (transient ischemic attack) 08/19/2014  . Right arm numbness   . Encounter for long-term (current) use of medications 06/13/2013  . Allergy   . Asthma   . Diarrhea 04/26/2013  . Hyperlipidemia 01/02/2013  . Vitamin D deficiency 01/02/2013  . HTN 03/07/2012   Past Medical History:  Past Medical History  Diagnosis Date  . Seasonal allergies   . Irregular periods/menstrual cycles   . Torn meniscus   . Hypertension   . Diverticula, colon   . Allergy   . Asthma   . Bundle branch block    Past Surgical History:  Past Surgical History  Procedure Laterality Date  . Knee surgery      partial knee replacement right  . Cesarean section      x2  . Tubes in ears  10/12  . Tonsillectomy    . Breast reduction surgery    . Cardiac catheterization     HPI:  54 y.o. female with Past medical history of hypertension, left bundle branch block.; presented to ED on 08/19/14 with slurred speech and right-sided weakness/numbness; substernal chest pain noted also at time of admission; unremarkable CTA head/neck on 08/19/14; MRI pending  Assessment / Plan / Recommendation Clinical Impression   Pt's speech, language and cognition are all WFL; no ST needs at this time; no f/u recommendations    SLP Assessment  Patient does not need any further Speech Language Pathology Services    Follow Up Recommendations  None    Frequency and Duration   n/a     Pertinent Vitals/Pain Pain Assessment: No/denies pain   SLP Goals  Patient/Family Stated Goal: return home  SLP Evaluation Prior Functioning  Cognitive/Linguistic Baseline: Within functional limits Type of Home: House Education: Business  degree   Cognition  Overall Cognitive Status: Within Functional Limits for tasks assessed Arousal/Alertness: Awake/alert Orientation Level: Oriented X4 Memory: Appears intact Awareness: Appears intact Problem Solving: Appears intact Safety/Judgment: Appears intact    Comprehension  Auditory Comprehension Overall Auditory Comprehension: Appears within functional limits for tasks assessed Yes/No Questions: Within Functional Limits Commands: Within Functional Limits Conversation: Complex Visual Recognition/Discrimination Discrimination: Not tested Reading Comprehension Reading Status: Within funtional limits    Expression Verbal Expression Overall Verbal Expression: Appears within functional limits for tasks assessed Level of Generative/Spontaneous Verbalization: Conversation Repetition: No impairment Naming: No impairment Pragmatics: No impairment Non-Verbal Means of Communication: Not applicable Written Expression Written Expression: Not tested   Oral / Motor Oral Motor/Sensory Function Overall Oral Motor/Sensory Function: Appears within functional limits for tasks assessed Motor Speech Overall Motor Speech: Appears within functional limits for tasks assessed Respiration: Within functional limits Phonation: Normal Resonance: Within functional limits Articulation: Within functional limitis Intelligibility: Intelligible        Carmell Elgin,PAT, M.S., CCC-SLP 08/20/2014, 10:31 AM

## 2014-08-20 NOTE — Progress Notes (Signed)
STROKE TEAM PROGRESS NOTE   HISTORY Gail Bailey is a 54 y.o. female who reports that yesterday evening while doing some light work around the house she began to have chest pain. She also noted difficulty speaking, in that she could not get the words out and has tingling in the right arm and leg. Patient presented for evaluation at that time. Speech deficits improved fairly quickly but paresthesias lasted for hours. They have completely resolved at this time. Was tearful on presentation.   Date last known well: Date: 08/18/2014 Time last known well: Time: 19:00 tPA Given: No: Resolution of symptoms   SUBJECTIVE (INTERVAL HISTORY) Patient sitting up in the chair. No complaints. Asking about lipids and permission to travel next week (ok w/ Leonie Man).   OBJECTIVE Temp:  [97.6 F (36.4 C)-98.7 F (37.1 C)] 98.7 F (37.1 C) (08/08 1003) Pulse Rate:  [60-78] 69 (08/08 1003) Cardiac Rhythm:  [-] Normal sinus rhythm (08/08 0837) Resp:  [16-20] 18 (08/08 1003) BP: (115-152)/(47-75) 130/61 mmHg (08/08 1003) SpO2:  [97 %-99 %] 99 % (08/08 1003)  No results for input(s): GLUCAP in the last 168 hours.  Recent Labs Lab 08/18/14 2200 08/19/14 0805 08/20/14 0756  NA 139 136 137  K 3.4* 3.8 3.8  CL 106 104 102  CO2 21* 24 26  GLUCOSE 114* 148* 107*  BUN 13 10 11   CREATININE 0.71 0.90 0.88  CALCIUM 9.0 8.8* 8.3*  MG  --   --  2.0    Recent Labs Lab 08/19/14 0805 08/20/14 0756  AST 23 21  ALT 18 18  ALKPHOS 72 73  BILITOT 0.2* 0.6  PROT 5.6* 5.9*  ALBUMIN 3.0* 3.1*    Recent Labs Lab 08/18/14 2200 08/19/14 0805 08/20/14 0756  WBC 10.1 8.3 9.5  NEUTROABS 6.2 5.1 5.9  HGB 13.9 13.2 14.2  HCT 41.6 40.1 42.8  MCV 90.0 89.3 90.5  PLT 352 279 303    Recent Labs Lab 08/18/14 2200 08/19/14 0805 08/19/14 1218  TROPONINI <0.03 <0.03 <0.03    Recent Labs  08/19/14 0805  LABPROT 13.5  INR 1.01   No results for input(s): COLORURINE, LABSPEC, PHURINE, GLUCOSEU,  HGBUR, BILIRUBINUR, KETONESUR, PROTEINUR, UROBILINOGEN, NITRITE, LEUKOCYTESUR in the last 72 hours.  Invalid input(s): APPERANCEUR     Component Value Date/Time   CHOL 211* 08/19/2014 1542   TRIG 445* 08/19/2014 1542   HDL 32* 08/19/2014 1542   CHOLHDL 6.6 08/19/2014 1542   VLDL UNABLE TO CALCULATE IF TRIGLYCERIDE OVER 400 mg/dL 08/19/2014 1542   LDLCALC UNABLE TO CALCULATE IF TRIGLYCERIDE OVER 400 mg/dL 08/19/2014 1542   Lab Results  Component Value Date   HGBA1C 5.6 08/19/2014   No results found for: LABOPIA, COCAINSCRNUR, LABBENZ, AMPHETMU, THCU, LABBARB  No results for input(s): ETH in the last 168 hours.   Dg Chest 2 View 08/18/2014    No acute cardiopulmonary process.     Ct Head Wo Contrast 08/18/2014    Normal exam.    Mr Brain Wo Contrast 08/19/2014    Normal noncontrast MRI of the brain.    CTA of the head and neck 08/19/2014 Unremarkable CTA head and neck.  2D Echocardiogram   - Left ventricle: The cavity size was normal. Wall thickness wasnormal. Systolic function was mildly to moderately reduced. Theestimated ejection fraction was in the range of 40% to 45%.Severe hypokinesis of the apicalanteroseptal and apicalmyocardium. Features are consistent with a pseudonormal leftventricular filling pattern, with concomitant abnormal relaxationand increased filling pressure (grade 2  diastolic dysfunction).   PHYSICAL EXAM  Pleasant middle-aged Caucasian lady currently not in distress. Afebrile. Head is nontraumatic. Neck is supple without bruit.    Cardiac exam no murmur or gallop. Lungs are clear to auscultation. Distal pulses are well felt.  Neurological Exam ;  Awake  Alert oriented x 3. Normal speech and language.eye movements full without nystagmus.fundi were not visualized. Vision acuity and fields appear normal. Hearing is normal. Palatal movements are normal. Face symmetric. Tongue midline. Normal strength, tone, reflexes and coordination. Normal sensation.  Gait deferred.     ASSESSMENT/PLAN Gail Bailey is a 54 y.o. female with history of hypertension, asthma, bundle branch block, and estrogen therapy presenting with chest pain, difficulty speaking, and  tingling in the right hand arm and leg. She did not receive IV t-PA due to resolution of deficits.  Possible left brain TIA  Resultant  resolution of deficits  MRI  normal  CTA head and neck - unremarkable  Carotid Doppler  refer to the CTA of the neck  2D Echo 40% to 45%.Severe hypokinesis of the apicalanteroseptal and apicalmyocardium.  LDL unable to calculate  HgbA1c 5.6, WNL  Lovenox for VTE prophylaxis Diet Heart Room service appropriate?: Yes; Fluid consistency:: Thin  no antithrombotic prior to admission, now on aspirin 325 mg orally every day  Patient counseled to be compliant with her antithrombotic medications  Ongoing aggressive stroke risk factor management  Therapy recommendations: NO PT, no ST  Disposition: return home. Ok for discharge from stroke standpoint  Patient has a 10-15% risk of having another stroke over the next year, the highest risk is within 2 weeks of the most recent stroke/TIA (risk of having a stroke following a stroke or TIA is the same).  Stroke Service will sign off. Please call should any needs arise.  Follow-up Stroke Clinic at [facility] with Dr. Antony Contras in 2 months, order placed.  Hypertension  Home meds:  Lisinopril 40 mg daily and verapamil 180 mg daily  Stable  Patient counseled to be compliant with her blood pressure medications  Hyperlipidemia  Home meds:  No lipid lowering medications prior to admission  LDL unable to calculate, goal < 70  lipitor 40 mg added  Continue statin at discharge  Other Stroke Risk Factors  ETOH use  Estrogen / progesterone therapy - pt counseled to stop products with estrogen. Ok to continue progesterone only if needed  Other Active Problems  Mild  hypokalemia - supplement  Elevated Triglycerides in the past  Other Pertinent History  EKG - 08/18/2011 - NSR rate 80 bpm - computer read as anteroseptal infarct - age undetermined  The patient has seen Dr. Acie Fredrickson in the past for hypertension. EKG 2014 per Dr Acie Fredrickson showed intraventricular conduction delay. No significant change since then.  Hospital day #    [place] for Pager information 08/20/2014 1:43 PM  I have personally examined this patient, reviewed notes, independently viewed imaging studies, participated in medical decision making and plan of care. I have made any additions or clarifications directly to the above note. Agree with note above. Add Lipitor for elevated lipids. Continue aspirin. Discharge home. Follow-up as an outpatient in 2 months. Discussed with patient and family and answered questions  Antony Contras, MD Medical Director [facility] Pager: [phone] 08/20/2014 1:54 PM  To contact Stroke Continuity provider, please refer to [email or web address] After hours, contact General Neurology

## 2014-08-20 NOTE — Progress Notes (Signed)
OT Cancellation Note and Discharge  Patient Details Name: Gail Bailey MRN: [id number] DOB: July 16, 1960   Cancelled Treatment:    Reason Eval/Treat Not Completed: Other (comment). Received info from PT that all symptoms have resolved and pt back to baseline. Eval not completed.   Almon Register [phone] 08/20/2014, 1:43 PM
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
## 2014-08-22 ENCOUNTER — Ambulatory Visit (INDEPENDENT_AMBULATORY_CARE_PROVIDER_SITE_OTHER): Payer: BLUE CROSS/BLUE SHIELD | Admitting: Physician Assistant

## 2014-08-22 ENCOUNTER — Other Ambulatory Visit: Payer: Self-pay | Admitting: Internal Medicine

## 2014-08-22 ENCOUNTER — Encounter: Payer: Self-pay | Admitting: Physician Assistant

## 2014-08-22 VITALS — BP 134/60 | HR 69 | Ht 66.0 in | Wt 136.2 lb

## 2014-08-22 DIAGNOSIS — E782 Mixed hyperlipidemia: Secondary | ICD-10-CM | POA: Diagnosis not present

## 2014-08-22 DIAGNOSIS — I429 Cardiomyopathy, unspecified: Secondary | ICD-10-CM

## 2014-08-22 DIAGNOSIS — G458 Other transient cerebral ischemic attacks and related syndromes: Secondary | ICD-10-CM | POA: Diagnosis not present

## 2014-08-22 DIAGNOSIS — I10 Essential (primary) hypertension: Secondary | ICD-10-CM | POA: Diagnosis not present

## 2014-08-22 MED ORDER — ASPIRIN EC 81 MG PO TBEC: 81.0000 mg | DELAYED_RELEASE_TABLET | Freq: Every day | ORAL | Status: AC

## 2014-08-22 MED ORDER — CARVEDILOL 12.5 MG PO TABS: 12.5000 mg | ORAL_TABLET | Freq: Two times a day (BID) | ORAL | Status: DC

## 2014-08-22 NOTE — Patient Instructions (Signed)
Medication Instructions:  1. STOP VERAPAMIL 2. START COREG 12.5 MG 1 TABLET TWICE DAILY (EVERY 12 HOURS) 3. CONTINUE ON ASPIRIN 81 MG DAILY  Labwork: NONE  Testing/Procedures: Your physician has requested that you have en exercise stress myoview PER SCOTT [employer]AVER, PAC THIS NEED TO BE A 2 DAY PROTOCOL DUE TO BREAST ATTENUATION. For further information please visit [email or web address]. Please follow instruction sheet, as given.   Follow-Up: DR. Acie Fredrickson 1-2 MONTHS PER DR. Acie Fredrickson  Any Other Special Instructions Will Be Listed Below (If Applicable).

## 2014-08-23 ENCOUNTER — Other Ambulatory Visit: Payer: Self-pay | Admitting: *Deleted

## 2014-08-23 MED ORDER — MONTELUKAST SODIUM 10 MG PO TABS
10.0000 mg | ORAL_TABLET | Freq: Every day | ORAL | Status: DC
Start: 2014-08-23 — End: 2014-09-18

## 2014-08-29 ENCOUNTER — Telehealth (HOSPITAL_COMMUNITY): Payer: Self-pay

## 2014-08-29 NOTE — Telephone Encounter (Signed)
Left message on voicemail in reference to upcoming appointment scheduled for 09-03-2014. Phone number given for a call back so details instructions can be given. Gail Bailey A   

## 2014-08-30 ENCOUNTER — Telehealth (HOSPITAL_COMMUNITY): Payer: Self-pay

## 2014-08-30 NOTE — Telephone Encounter (Signed)
Left message on voicemail in reference to upcoming appointment scheduled for 09-03-2014. Phone number given for a call back so details instructions can be given. Gail Bailey A   

## 2014-09-03 ENCOUNTER — Ambulatory Visit (INDEPENDENT_AMBULATORY_CARE_PROVIDER_SITE_OTHER): Payer: BLUE CROSS/BLUE SHIELD | Admitting: Internal Medicine

## 2014-09-03 ENCOUNTER — Encounter: Payer: Self-pay | Admitting: Internal Medicine

## 2014-09-03 ENCOUNTER — Ambulatory Visit (HOSPITAL_COMMUNITY): Payer: BLUE CROSS/BLUE SHIELD | Attending: Internal Medicine

## 2014-09-03 VITALS — BP 142/80 | HR 60 | Temp 97.4°F | Resp 16 | Ht 62.0 in | Wt 136.0 lb

## 2014-09-03 DIAGNOSIS — R079 Chest pain, unspecified: Secondary | ICD-10-CM | POA: Diagnosis not present

## 2014-09-03 DIAGNOSIS — Z8673 Personal history of transient ischemic attack (TIA), and cerebral infarction without residual deficits: Secondary | ICD-10-CM | POA: Insufficient documentation

## 2014-09-03 DIAGNOSIS — R7309 Other abnormal glucose: Secondary | ICD-10-CM | POA: Insufficient documentation

## 2014-09-03 DIAGNOSIS — R7303 Prediabetes: Secondary | ICD-10-CM

## 2014-09-03 DIAGNOSIS — Z6824 Body mass index (BMI) 24.0-24.9, adult: Secondary | ICD-10-CM

## 2014-09-03 DIAGNOSIS — I10 Essential (primary) hypertension: Secondary | ICD-10-CM

## 2014-09-03 DIAGNOSIS — I429 Cardiomyopathy, unspecified: Secondary | ICD-10-CM | POA: Diagnosis not present

## 2014-09-03 DIAGNOSIS — G43009 Migraine without aura, not intractable, without status migrainosus: Secondary | ICD-10-CM

## 2014-09-03 DIAGNOSIS — R9439 Abnormal result of other cardiovascular function study: Secondary | ICD-10-CM | POA: Diagnosis not present

## 2014-09-03 DIAGNOSIS — Z79899 Other long term (current) drug therapy: Secondary | ICD-10-CM

## 2014-09-03 DIAGNOSIS — E559 Vitamin D deficiency, unspecified: Secondary | ICD-10-CM | POA: Diagnosis not present

## 2014-09-03 DIAGNOSIS — R002 Palpitations: Secondary | ICD-10-CM | POA: Insufficient documentation

## 2014-09-03 DIAGNOSIS — Z1212 Encounter for screening for malignant neoplasm of rectum: Secondary | ICD-10-CM

## 2014-09-03 DIAGNOSIS — J4520 Mild intermittent asthma, uncomplicated: Secondary | ICD-10-CM

## 2014-09-03 DIAGNOSIS — G459 Transient cerebral ischemic attack, unspecified: Secondary | ICD-10-CM

## 2014-09-03 DIAGNOSIS — E782 Mixed hyperlipidemia: Secondary | ICD-10-CM

## 2014-09-03 DIAGNOSIS — Z0000 Encounter for general adult medical examination without abnormal findings: Secondary | ICD-10-CM | POA: Diagnosis not present

## 2014-09-03 DIAGNOSIS — R5383 Other fatigue: Secondary | ICD-10-CM

## 2014-09-03 LAB — TSH: TSH: 0.87 u[IU]/mL (ref 0.350–4.500)

## 2014-09-03 LAB — VITAMIN B12: VITAMIN B 12: 579 pg/mL (ref 211–911)

## 2014-09-03 LAB — IRON AND TIBC
%SAT: 29 % (ref 20–55)
Iron: 80 ug/dL (ref 42–145)
TIBC: 278 ug/dL (ref 250–470)
UIBC: 198 ug/dL (ref 125–400)

## 2014-09-03 MED ORDER — BUTALBITAL-APAP-CAFFEINE 50-325-40 MG PO TABS: ORAL_TABLET | ORAL | Status: AC

## 2014-09-03 MED ORDER — TECHNETIUM TC 99M SESTAMIBI GENERIC - CARDIOLITE
31.7000 | Freq: Once | INTRAVENOUS | Status: AC | PRN
  Administered 2014-09-03: 31.7 via INTRAVENOUS

## 2014-09-03 MED ORDER — PREGABALIN 50 MG PO CAPS: ORAL_CAPSULE | ORAL | Status: DC

## 2014-09-03 NOTE — Patient Instructions (Signed)

## 2014-09-03 NOTE — Progress Notes (Signed)
[name], female   DOB: 1960/10/01, 54 y.o.   MRN: [id number]   Comprehensive Examination  This very nice 54 y.o. DWF presents for complete physical.  Patient has been followed for HTN, Migraine, Prediabetes, Hyperlipidemia, and Vitamin D Deficiency.     Patient was just hospitalized 8/6-08/2014 with mild dysphasia and right -sided paresthesias and had Negative Head CT scan , Brain MRI and CTA of carotid. Patient was started on LD bASA 81 mg and her verapamil was switched post hospital to carvedilol for consideration of a 2DEC & a ? Of diastolic dysfunction. Patient reports an exacerbation of her vascular HA's since the switch. So far she has not yet noted an exacerbation of her asthma. Patient further relates 2 weeks prior that her Estradiol dose had been doubled for hot flashes and were d/c'd in the hospital with exacerbation of her hot flashes - esp at night.       HTN predates since 2014. Patient's BP has been controlled at home and patient denies any cardiac symptoms as chest pain, palpitations, shortness of breath, dizziness or ankle swelling. Today's BP: (!) 142/80 mmHg      Patient's hyperlipidemia is not controlled with diet and patient was started on Atorvastatin 40 mg qd 2 weeks ago in the hospital. . Patient denies myalgias or other medication SE's. Last lipids were  Cholesterol 211*; HDL 32*; LDL not calculated; and with elevated Triglycerides 445* on 08/19/2014.     Patient has prediabetes predating since 2012 with A1c 6.0%  and patient denies reactive hypoglycemic symptoms, visual blurring, diabetic polys, or paresthesias. Last A1c was 5.6% on 08/19/2014.     Finally, patient has history of Vitamin D Deficiency and last Vitamin D was 108 in June 2015.      Medication Sig  . acyclovir ointment (ZOVIRAX) 5 % Apply 1 application topically daily as needed (breakouts).   . ALPRAZolam (XANAX) 0.5 MG tablet Take 1 tablet (0.5 mg total) by mouth 3 (three) times daily as needed for  sleep or anxiety.  Marland Kitchen aspirin EC 81 MG tablet Take 1 tablet (81 mg total) by mouth daily.  . Atorvastatin 40 MG tablet Take 1 tablet (40 mg total) by mouth daily at 6 PM.  . carvedilol (COREG) 12.5 MG tablet Take 1 tablet (12.5 mg total) by mouth 2 (two) times daily.  . cetirizine  10 MG tablet Take 10 mg by mouth daily.   Marland Kitchen VITAMIN D  Take 1 tablet by mouth daily.  Marland Kitchen EPIDUO 0.1-2.5 % gel Apply 1 application topically as needed (acne).   Marland Kitchen lisinopril  40 MG tab TAKE ONE TABLET DAILY FOR BLOOD PRESSURE  . montelukast  10 MG tablet Take 1 tablet (10 mg total) by mouth daily.  Marland Kitchen FISH OIL PO Take 1 capsule by mouth daily.  . Red Yeast Rice Extract Take 1 tablet by mouth daily.  . sertraline (50 MG tablet Take 1 tablet (50 mg total) by mouth daily.  . valACYclovir  500 MG tablet Take 500 mg by mouth 2 (two) times daily as needed (for breakouts).   . VENTOLIN HFA  inhaler Inhale 1-2 puffs into the lungs every 6 (six) hours as needed for wheezing or shortness of breath.   . verapamil (CALAN-SR) 180 MG CR  TAKE 1/2 TO 1 TABLET TWICE DAILY WITH FOOD FOR BLOOD PRESSURE   No Known Allergies   Past Medical History  Diagnosis Date  . Seasonal allergies   . Irregular periods/menstrual cycles   .  Torn meniscus   . Hypertension   . Diverticula, colon   . Allergy   . Asthma   . LBBB (left bundle branch block)   . Cardiomyopathy     a. Echo 8/16:  EF 40-45%, apical and ant-septal HK, Gr 2 DD   Health Maintenance  Topic Date Due  . INFLUENZA VACCINE  08/13/2014  . TETANUS/TDAP  10/13/2014  . MAMMOGRAM  04/26/2016  . COLONOSCOPY  08/24/2022  . Hepatitis C Screening  Completed  . HIV Screening  Completed   Immunization History  Administered Date(s) Administered  . Influenza Split 10/24/2013  . PPD Test 06/13/2013  . Pneumococcal Polysaccharide-23 02/12/2004  . Td 10/12/2004   Past Surgical History  Procedure Laterality Date  . Knee surgery      partial knee replacement right  . Cesarean  section      x2  . Tubes in ears  10/12  . Tonsillectomy    . Breast reduction surgery    . Cardiac catheterization     Family History  Problem Relation Age of Onset  . Colon cancer Neg Hx   . Diabetes Mother   . Heart disease Mother   . Hypertension Mother   . Other Father     MVA  . Asthma Sister   . Asthma Son   . Cancer Maternal Grandmother     Uterine  . Heart attack Paternal Grandmother   . Heart attack Mother 4   Social History  Substance Use Topics  . Smoking status: Never Smoker   . Smokeless tobacco: Never Used  . Alcohol Use: 3.0 oz/week    5 Glasses of wine per week    ROS Constitutional: Denies fever, chills, weight loss/gain, headaches, insomnia,  night sweats, and change in appetite. Does c/o fatigue. Eyes: Denies redness, blurred vision, diplopia, discharge, itchy, watery eyes.  ENT: Denies discharge, congestion, post nasal drip, epistaxis, sore throat, earache, hearing loss, dental pain, Tinnitus, Vertigo, Sinus pain, snoring.  Cardio: Denies chest pain, palpitations, irregular heartbeat, syncope, dyspnea, diaphoresis, orthopnea, PND, claudication, edema Respiratory: denies cough, dyspnea, DOE, pleurisy, hoarseness, laryngitis, wheezing.  Gastrointestinal: Denies dysphagia, heartburn, reflux, water brash, pain, cramps, nausea, vomiting, bloating, diarrhea, constipation, hematemesis, melena, hematochezia, jaundice, hemorrhoids Genitourinary: Denies dysuria, frequency, urgency, nocturia, hesitancy, discharge, hematuria, flank pain Breast: Breast lumps, nipple discharge, bleeding.  Musculoskeletal: Denies arthralgia, myalgia, stiffness, Jt. Swelling, pain, limp, and strain/sprain. Denies falls. Skin: Denies puritis, rash, hives, warts, acne, eczema, changing in skin lesion Neuro: No weakness, tremor, incoordination, spasms, paresthesia, pain Psychiatric: Denies confusion, memory loss, sensory loss. Denies Depression. Endocrine: Denies change in weight, skin,  hair change, nocturia, and paresthesia, diabetic polys, visual blurring, hyper / hypo glycemic episodes.  Heme/Lymph: No excessive bleeding, bruising, enlarged lymph nodes.  Physical Exam  BP 142/80  Pulse 60  Temp 97.4 F   Resp 16  Ht 5\' 2"    Wt 136 lb     BMI 24.87   General Appearance: Well nourished and in no apparent distress. Eyes: PERRLA, EOMs, conjunctiva no swelling or erythema, normal fundi and vessels. Sinuses: No frontal/maxillary tenderness ENT/Mouth: EACs patent / TMs  nl. Nares clear without erythema, swelling, mucoid exudates. Oral hygiene is good. No erythema, swelling, or exudate. Tongue normal, non-obstructing. Tonsils not swollen or erythematous. Hearing normal.  Neck: Supple, thyroid normal. No bruits, nodes or JVD. Respiratory: Respiratory effort normal.  BS equal and clear bilateral without rales, rhonci, wheezing or stridor. Cardio: Heart sounds are normal with regular rate and  rhythm and no murmurs, rubs or gallops. Peripheral pulses are normal and equal bilaterally without edema. No aortic or femoral bruits. Chest: symmetric with normal excursions and percussion. Breasts: Deferred to GYN Abdomen: Flat, soft, with bowel sounds. Nontender, no guarding, rebound, hernias, masses, or organomegaly.  Lymphatics: Non tender without lymphadenopathy.  Genitourinary: Deferred to GYN Musculoskeletal: Full ROM all peripheral extremities, joint stability, 5/5 strength, and normal gait. Skin: Warm and dry without rashes, lesions, cyanosis, clubbing or  ecchymosis.  Neuro: Cranial nerves intact, reflexes equal bilaterally. Normal muscle tone, no cerebellar symptoms. Sensation intact.  Pysch: Awake and oriented X 3, normal affect, Insight and Judgment appropriate.   Assessment and Plan  1. Essential hypertension  - [place], RETROPERITNL ABD,  LTD - TSH  2. Hyperlipidemia  - recommended to taper the Atorvastatin 40 mg to 1/2 tablet 3 x /week on TThSat.  3.  Prediabetes  - Insulin, random  4. Vitamin D deficiency  - Vit D  25 hydroxy   5. Transient cerebral ischemia (08/18/2014) -   - Possibly may have represented Migraine equivalent  6. Asthma, mild intermittent, uncomplicated   7. Screening for rectal cancer  - POC Hemoccult Bld/Stl   8. Other fatigue  - Vitamin B12 - Iron and TIBC - TSH  9. Encounter for long-term (current) use of medications   10. Body mass index (BMI) of 24.0-24.9 in adult   11. Vascular HA's  - Rx - Fioricet #50 x 1 rf - 1-2 tabs qid prn  - may need to consider switching back to verapamil for concern also of asthma exacerbation with a non selective Beta blocker  12. Hot Flashes   - Sx Lyrica 50 mg #21 & 75 mg #14 to try evenings for hot flashes.    ROV 1 month to re-assess BP, HA control, Asthma stability, and "Hot flashes"   Continue prudent diet as discussed, weight control, BP monitoring, regular exercise, and medications. Discussed med's effects and SE's. Screening labs and tests as requested with regular follow-up as recommended.  Over 40 minutes of exam, counseling, chart review was performed.

## 2014-09-04 ENCOUNTER — Ambulatory Visit (HOSPITAL_COMMUNITY): Payer: BLUE CROSS/BLUE SHIELD | Attending: Cardiovascular Disease

## 2014-09-04 DIAGNOSIS — I429 Cardiomyopathy, unspecified: Secondary | ICD-10-CM | POA: Diagnosis not present

## 2014-09-04 LAB — MYOCARDIAL PERFUSION IMAGING
CHL CUP NUCLEAR SDS: 4
CHL CUP RESTING HR STRESS: 60 {beats}/min
LHR: 0.29
LV dias vol: 103 mL
LV sys vol: 54 mL
Peak HR: 88 {beats}/min
SRS: 5
SSS: 9
TID: 1.03

## 2014-09-04 LAB — MICROALBUMIN / CREATININE URINE RATIO
Creatinine, Urine: 26.7 mg/dL
Microalb, Ur: <0.2 mg/dL (ref <2.0)

## 2014-09-04 LAB — VITAMIN D 25 HYDROXY (VIT D DEFICIENCY, FRACTURES): Vit D, 25-Hydroxy: 58 ng/mL (ref 30–100)

## 2014-09-04 LAB — INSULIN, RANDOM: Insulin: 32.6 u[IU]/mL — ABNORMAL HIGH (ref 2.0–19.6)

## 2014-09-04 MED ORDER — REGADENOSON 0.4 MG/5ML IV SOLN
0.4000 mg | Freq: Once | INTRAVENOUS | Status: AC
  Administered 2014-09-04: 0.4 mg via INTRAVENOUS

## 2014-09-04 MED ORDER — TECHNETIUM TC 99M SESTAMIBI GENERIC - CARDIOLITE
32.9000 | Freq: Once | INTRAVENOUS | Status: AC | PRN
  Administered 2014-09-04: 32.9 via INTRAVENOUS

## 2014-09-06 ENCOUNTER — Telehealth: Payer: Self-pay | Admitting: Cardiovascular Disease

## 2014-09-06 MED ORDER — METOPROLOL SUCCINATE ER 25 MG PO TB24: 25.0000 mg | ORAL_TABLET | Freq: Every day | ORAL | Status: DC

## 2014-09-06 NOTE — Telephone Encounter (Signed)
Verapamil will not work since her EF is mildly low . Would try Toprol XL 25  mg a day  I will need to see her as a work in sometime soon

## 2014-09-06 NOTE — Telephone Encounter (Signed)
Spoke with patient and reported myoview results.  Patient states she would like to discuss a medication reaction also; states she has had a headache every day since starting Carvedilol on 8/10 as directed by Richardson Dopp, PA.  She would like to know if Dr. Acie Fredrickson would approve her going back on Verapamil.  I advised her that I will discuss with him and will call her back with his advice.  She states I may leave a detailed message on her personal voice mail.

## 2014-09-06 NOTE — Telephone Encounter (Signed)
New message ° ° °Patient returning call back to nurse.  °

## 2014-09-06 NOTE — Telephone Encounter (Signed)
Left detailed message on patient's voice mail of medication change.  Advised that will need to try Toprol XL as Verapamil is not a viable option for her depressed pumping function.  I asked her to call me to schedule a sooner appointment with Dr. Acie Fredrickson as she is currently scheduled to follow-up in October.

## 2014-09-07 ENCOUNTER — Encounter: Payer: Self-pay | Admitting: Physician Assistant

## 2014-09-17 ENCOUNTER — Other Ambulatory Visit: Payer: Self-pay | Admitting: Internal Medicine

## 2014-10-01 ENCOUNTER — Other Ambulatory Visit (HOSPITAL_COMMUNITY): Payer: Self-pay | Admitting: Orthopedic Surgery

## 2014-10-01 DIAGNOSIS — M25551 Pain in right hip: Secondary | ICD-10-CM

## 2014-10-01 DIAGNOSIS — S76311S Strain of muscle, fascia and tendon of the posterior muscle group at thigh level, right thigh, sequela: Secondary | ICD-10-CM

## 2014-10-04 ENCOUNTER — Ambulatory Visit (HOSPITAL_COMMUNITY)
Admission: RE | Admit: 2014-10-04 | Discharge: 2014-10-04 | Disposition: A | Discharge status: Home / Self Care | Payer: BLUE CROSS/BLUE SHIELD | Source: Ambulatory Visit | Attending: Orthopedic Surgery | Admitting: Orthopedic Surgery

## 2014-10-04 DIAGNOSIS — M25551 Pain in right hip: Secondary | ICD-10-CM

## 2014-10-04 DIAGNOSIS — S76011S Strain of muscle, fascia and tendon of right hip, sequela: Secondary | ICD-10-CM | POA: Diagnosis not present

## 2014-10-04 DIAGNOSIS — X58XXXS Exposure to other specified factors, sequela: Secondary | ICD-10-CM | POA: Diagnosis not present

## 2014-10-04 DIAGNOSIS — S76311S Strain of muscle, fascia and tendon of the posterior muscle group at thigh level, right thigh, sequela: Secondary | ICD-10-CM | POA: Diagnosis present

## 2014-10-05 ENCOUNTER — Encounter: Payer: Self-pay | Admitting: Internal Medicine

## 2014-10-05 ENCOUNTER — Ambulatory Visit (INDEPENDENT_AMBULATORY_CARE_PROVIDER_SITE_OTHER): Payer: BLUE CROSS/BLUE SHIELD | Admitting: Internal Medicine

## 2014-10-05 VITALS — BP 136/82 | HR 64 | Temp 97.9°F | Resp 16 | Ht 62.0 in | Wt 137.6 lb

## 2014-10-05 DIAGNOSIS — R51 Headache: Secondary | ICD-10-CM

## 2014-10-05 DIAGNOSIS — R7309 Other abnormal glucose: Secondary | ICD-10-CM | POA: Diagnosis not present

## 2014-10-05 DIAGNOSIS — R519 Headache, unspecified: Secondary | ICD-10-CM

## 2014-10-05 DIAGNOSIS — Z6825 Body mass index (BMI) 25.0-25.9, adult: Secondary | ICD-10-CM

## 2014-10-05 DIAGNOSIS — N951 Menopausal and female climacteric states: Secondary | ICD-10-CM | POA: Diagnosis not present

## 2014-10-05 DIAGNOSIS — R7303 Prediabetes: Secondary | ICD-10-CM

## 2014-10-05 DIAGNOSIS — R232 Flushing: Secondary | ICD-10-CM

## 2014-10-05 DIAGNOSIS — I10 Essential (primary) hypertension: Secondary | ICD-10-CM

## 2014-10-05 MED ORDER — PREGABALIN 50 MG PO CAPS: ORAL_CAPSULE | ORAL | Status: DC

## 2014-10-07 ENCOUNTER — Encounter: Payer: Self-pay | Admitting: Internal Medicine

## 2014-10-07 DIAGNOSIS — Z6825 Body mass index (BMI) 25.0-25.9, adult: Secondary | ICD-10-CM | POA: Insufficient documentation

## 2014-10-07 NOTE — Progress Notes (Signed)
Subjective:    [name], female    DOB: 02-28-1960, 54 y.o.   MRN: [id number]  HPI   A very nice 54 yo  Recently re-MWF returning for f/u of BP, Hot flashes and Migraine Headaches.  In early Aug. patient has a brief hospitaloization with a question of TIA vs Migraine Equivalent and was d/c'd her ERT and her Verapamil which previously was switched to carvedilol and post hospitalization , she had an exascerbation of headaches and was switched to metoprolol & HA's seemed to have improved. She also had been started on Lyrica for hot flashes with apparent good response & control of hot flashes.   Medication Sig  . acyclovir ointment (ZOVIRAX) 5 % Apply 1 application topically daily as needed (breakouts).   . ALPRAZolam (XANAX) 0.5 MG tablet Take 1 tablet (0.5 mg total) by mouth 3 (three) times daily as needed for sleep or anxiety.  Marland Kitchen aspirin EC 81 MG tablet Take 1 tablet (81 mg total) by mouth daily.  Marland Kitchen atorvastatin (LIPITOR) 40 MG tablet Take 1/2 to 1 tablet daily or as directed for Cholesterol  . butalbital-acetaminophen-caffeine (FIORICET) 50-325-40 MG per tablet Take 1-2 tabs qid prn HA  . cetirizine (ZYRTEC) 10 MG tablet Take 10 mg by mouth daily.   . Cholecalciferol (VITAMIN D PO) Take 1 tablet by mouth daily.  Marland Kitchen EPIDUO 0.1-2.5 % gel Apply 1 application topically as needed (acne).   Marland Kitchen lisinopril (PRINIVIL,ZESTRIL) 40 MG tablet TAKE ONE TABLET DAILY FOR BLOOD PRESSURE  . metoprolol succinate (TOPROL XL) 25 MG 24 hr tablet Take 1 tablet (25 mg total) by mouth daily.  . montelukast (SINGULAIR) 10 MG tablet TAKE 1 TABLET BY MOUTH DAILY  . Omega-3 Fatty Acids (FISH OIL PO) Take 1 capsule by mouth daily.  . Red Yeast Rice Extract (RED YEAST RICE PO) Take 1 tablet by mouth daily.  . sertraline (ZOLOFT) 50 MG tablet Take 1 tablet (50 mg total) by mouth daily.  . valACYclovir (VALTREX) 500 MG tablet Take 500 mg by mouth 2 (two) times daily as needed (for breakouts).   . VENTOLIN HFA  108 (90 BASE) MCG/ACT inhaler Inhale 1-2 puffs into the lungs every 6 (six) hours as needed for wheezing or shortness of breath.   . pregabalin (LYRICA) 50 MG capsule Take 1-2 caps qhs prn hot flashes   No Known Allergies   Past Medical History  Diagnosis Date  . Seasonal allergies   . Irregular periods/menstrual cycles   . Torn meniscus   . Hypertension   . Diverticula, colon   . Allergy   . Asthma   . LBBB (left bundle branch block)   . Cardiomyopathy     a. Echo 8/16:  EF 40-45%, apical and ant-septal HK, Gr 2 DD  . History of cardiovascular stress test     Myoview 8/16:  EF 48%, anterior and apical defect c/w breast atten, no ischemia; Low Risk   Past Surgical History  Procedure Laterality Date  . Knee surgery      partial knee replacement right  . Cesarean section      x2  . Tubes in ears  10/12  . Tonsillectomy    . Breast reduction surgery    . Cardiac catheterization     Review of Systems 10 point systems review negative except as above.    Objective:   Physical Exam  BP 136/82 mmHg  Pulse 64  Temp(Src) 97.9 F (36.6 C)  Resp 16  Ht 5\' 2"  (1.575 m)  Wt 137 lb 9.6 oz (62.415 kg)  BMI 25.16 kg/m2  HEENT - Eac's patent. TM's Nl. EOM's full. PERRLA. NasoOroPharynx clear. Neck - supple. Nl Thyroid. Carotids 2+ & No bruits, nodes, JVD Chest - Clear equal BS w/o Rales, rhonchi, wheezes. Cor - Nl HS. RRR w/o sig MGR. PP 1(+). No edema. Abd - No palpable organomegaly, masses or tenderness. BS nl. MS- FROM w/o deformities. Muscle power, tone and bulk Nl. Gait Nl. Neuro - No obvious Cr N abnormalities. Sensory, motor and Cerebellar functions appear Nl w/o focal abnormalities. Psyche - Mental status normal & appropriate.  No delusions, ideations or obvious mood abnormalities.    Assessment & Plan:   1. Essential hypertension   2. Hot flashes  - pregabalin (LYRICA) 50 MG capsule; Take 1 capsule 3 x day if needed for hot flashes  Dispense: 90 capsule; Refill:  5  3. Headache disorder  - Discussed meds/SE, diet, exercise and f/u in 3 mo & 6 mo

## 2014-10-16 ENCOUNTER — Other Ambulatory Visit: Payer: Self-pay | Admitting: Internal Medicine

## 2014-10-18 ENCOUNTER — Other Ambulatory Visit: Payer: Self-pay | Admitting: Internal Medicine

## 2014-10-19 ENCOUNTER — Other Ambulatory Visit: Payer: Self-pay | Admitting: *Deleted

## 2014-10-19 MED ORDER — ALBUTEROL SULFATE HFA 108 (90 BASE) MCG/ACT IN AERS: INHALATION_SPRAY | RESPIRATORY_TRACT | Status: DC

## 2014-10-23 ENCOUNTER — Ambulatory Visit: Payer: BLUE CROSS/BLUE SHIELD | Admitting: Diagnostic Neuroimaging

## 2014-10-23 ENCOUNTER — Ambulatory Visit: Payer: BLUE CROSS/BLUE SHIELD | Admitting: Cardiovascular Disease

## 2014-11-02 ENCOUNTER — Other Ambulatory Visit: Payer: Self-pay | Admitting: Internal Medicine

## 2014-11-09 ENCOUNTER — Ambulatory Visit: Payer: BLUE CROSS/BLUE SHIELD | Admitting: Cardiovascular Disease

## 2014-12-15 ENCOUNTER — Other Ambulatory Visit: Payer: Self-pay | Admitting: Internal Medicine

## 2014-12-21 ENCOUNTER — Other Ambulatory Visit: Payer: Self-pay | Admitting: Internal Medicine

## 2014-12-21 MED ORDER — HYDROCHLOROTHIAZIDE 25 MG PO TABS: ORAL_TABLET | ORAL | Status: DC

## 2014-12-21 MED ORDER — MINOXIDIL 10 MG PO TABS: ORAL_TABLET | ORAL | Status: DC

## 2015-01-01 ENCOUNTER — Encounter: Payer: Self-pay | Admitting: Internal Medicine

## 2015-01-01 ENCOUNTER — Ambulatory Visit (INDEPENDENT_AMBULATORY_CARE_PROVIDER_SITE_OTHER): Payer: BLUE CROSS/BLUE SHIELD | Admitting: Internal Medicine

## 2015-01-01 VITALS — BP 110/68 | HR 72 | Temp 97.5°F | Resp 16 | Ht 62.0 in | Wt 138.6 lb

## 2015-01-01 DIAGNOSIS — I10 Essential (primary) hypertension: Secondary | ICD-10-CM

## 2015-01-01 DIAGNOSIS — G43009 Migraine without aura, not intractable, without status migrainosus: Secondary | ICD-10-CM

## 2015-01-01 MED ORDER — METOPROLOL SUCCINATE ER 100 MG PO TB24: ORAL_TABLET | ORAL | Status: DC

## 2015-01-01 NOTE — Patient Instructions (Signed)

## 2015-01-01 NOTE — Progress Notes (Signed)
Subjective:    [name], female    DOB: [birth year], 54 y.o.   MRN: [id number]  HPI   This very nice newly wedded WF ion Sept 4 was on her honeymoon 9/6-16 during which tome she sustained a fall and had surgery Oct 5 to reattach her R hamstrings proximal insertion and reports ongoing problems with R buttock pains aggravated by either sitting or walking. Recently her BP's have been very labile and have ranged 140-180/80-90 and she's had meds added & adjusted to control her BP. Denies HA, dizziness, CP, palpitations dyspnea or edema.   Medication Sig  . acyclovir ointment (ZOVIRAX) 5 % Apply 1 application topically daily as needed (breakouts).   Marland Kitchen albuterol (VENTOLIN HFA) 108 (90 BASE) MCG/ACT inhaler TAKE 2 PUFFS EVERY 4 HOURS DAILY AS NEEDED  . aspirin EC 81 MG tablet Take 1 tablet (81 mg total) by mouth daily.  Marland Kitchen atorvastatin (LIPITOR) 40 MG tablet Take 1/2 to 1 tablet daily or as directed for Cholesterol  . cetirizine (ZYRTEC) 10 MG tablet Take 10 mg by mouth daily.   . Cholecalciferol (VITAMIN D PO) Take 1 tablet by mouth daily.  Marland Kitchen EPIDUO 0.1-2.5 % gel Apply 1 application topically as needed (acne).   . hydrochlorothiazide (HYDRODIURIL) 25 MG tablet 1/2 to 1 tablet daily for BP & fluid  . lisinopril (PRINIVIL,ZESTRIL) 40 MG tablet TAKE ONE TABLET DAILY FOR BLOOD PRESSURE  . minoxidil (LONITEN) 10 MG tablet Take 1/4 to 1/2 tablet daily for BP  . montelukast (SINGULAIR) 10 MG tablet TAKE 1 TABLET BY MOUTH DAILY  . Omega-3 Fatty Acids (FISH OIL PO) Take 1 capsule by mouth daily.  . Red Yeast Rice Extract (RED YEAST RICE PO) Take 1 tablet by mouth daily.  . sertraline (ZOLOFT) 50 MG tablet TAKE 1 TABLET (50 MG TOTAL) BY MOUTH DAILY.  . valACYclovir (VALTREX) 500 MG tablet Take 500 mg by mouth 2 (two) times daily as needed (for breakouts).   . metoprolol succinate (TOPROL XL) 25 MG 24 hr tablet Take 1 tablet (25 mg total) by mouth daily. (Patient taking differently: Take 25 mg  by mouth. Takes 2 tabs daily.)  . pregabalin (LYRICA) 50 MG capsule Take 1-2 caps qhs prn hot flashes  . ALPRAZolam (XANAX) 0.5 MG tablet Take 1 tablet (0.5 mg total) by mouth 3 (three) times daily as needed for sleep or anxiety.   No Known Allergies   Past Medical History  Diagnosis Date  . Seasonal allergies   . Irregular periods/menstrual cycles   . Torn meniscus   . Hypertension   . Diverticula, colon   . Allergy   . Asthma   . LBBB (left bundle branch block)   . Cardiomyopathy ([place])     a. Echo 8/16:  EF 40-45%, apical and ant-septal HK, Gr 2 DD  . History of cardiovascular stress test     Myoview 8/16:  EF 48%, anterior and apical defect c/w breast atten, no ischemia; Low Risk   Review of Systems  10 point systems review negative except as above.    Objective:   Physical Exam  BP 110/68 mmHg  Pulse 72  Temp(Src) 97.5 F (36.4 C)  Resp 16  Ht 5\' 2"  (1.575 m)  Wt 138 lb 9.6 oz (62.869 kg)  BMI 25.34 kg/m2  HEENT - Eac's patent. TM's Nl. EOM's full. PERRLA. NasoOroPharynx clear. Neck - supple. Nl Thyroid. Carotids 2+ & No bruits, nodes, JVD Chest - Clear equal BS w/o  Rales, rhonchi, wheezes. Cor - Nl HS. RRR w/o sig MGR. PP 1(+). No edema. MS- FROM w/o deformities. Muscle power, tone and bulk Nl. Gait Nl. Neuro - No obvious Cr N abnormalities. Sensory, motor and Cerebellar functions appear Nl w/o focal abnormalities. Psyche - Mental status normal & appropriate.      Assessment & Plan:   1. Essential hypertension  - metoprolol succinate (TOPROL-XL) 100 MG 24 hr tablet; Take 1/2 to 1 tablet daily as directed for BP  Dispense: 90 tablet; Refill: 1  2. Migraine without aura and without status migrainosus, not intractable  - discussed meds/SE's . Asked to keep a diary of BP times and dosing schedule to evaluate for possible dosing schedule changes. - has 1 week f/u for quarterly labs.

## 2015-01-09 ENCOUNTER — Ambulatory Visit (INDEPENDENT_AMBULATORY_CARE_PROVIDER_SITE_OTHER): Payer: BLUE CROSS/BLUE SHIELD | Admitting: Internal Medicine

## 2015-01-09 ENCOUNTER — Encounter: Payer: Self-pay | Admitting: Internal Medicine

## 2015-01-09 VITALS — BP 138/84 | HR 58 | Temp 98.0°F | Resp 16 | Ht 62.0 in | Wt 138.0 lb

## 2015-01-09 DIAGNOSIS — I10 Essential (primary) hypertension: Secondary | ICD-10-CM

## 2015-01-09 DIAGNOSIS — E559 Vitamin D deficiency, unspecified: Secondary | ICD-10-CM | POA: Diagnosis not present

## 2015-01-09 DIAGNOSIS — Z23 Encounter for immunization: Secondary | ICD-10-CM | POA: Diagnosis not present

## 2015-01-09 DIAGNOSIS — E782 Mixed hyperlipidemia: Secondary | ICD-10-CM

## 2015-01-09 DIAGNOSIS — R7303 Prediabetes: Secondary | ICD-10-CM

## 2015-01-09 DIAGNOSIS — Z79899 Other long term (current) drug therapy: Secondary | ICD-10-CM

## 2015-01-09 LAB — LIPID PANEL
CHOL/HDL RATIO: 3.9 ratio (ref <=5.0)
Cholesterol: 160 mg/dL (ref 125–200)
HDL: 41 mg/dL — ABNORMAL LOW (ref >=46)
LDL CALC: 86 mg/dL (ref <130)
Triglycerides: 167 mg/dL — ABNORMAL HIGH (ref <150)
VLDL: 33 mg/dL — ABNORMAL HIGH (ref <30)

## 2015-01-09 LAB — CBC WITH DIFFERENTIAL/PLATELET
Basophils Absolute: 0.1 10*3/uL (ref 0.0–0.1)
Basophils Relative: 1 % (ref 0–1)
EOS PCT: 3 % (ref 0–5)
Eosinophils Absolute: 0.2 10*3/uL (ref 0.0–0.7)
HCT: 42.2 % (ref 36.0–46.0)
Hemoglobin: 14.5 g/dL (ref 12.0–15.0)
LYMPHS ABS: 2.4 10*3/uL (ref 0.7–4.0)
Lymphocytes Relative: 30 % (ref 12–46)
MCH: 29.8 pg (ref 26.0–34.0)
MCHC: 34.4 g/dL (ref 30.0–36.0)
MCV: 86.7 fL (ref 78.0–100.0)
MONO ABS: 0.6 10*3/uL (ref 0.1–1.0)
MPV: 12 fL (ref 8.6–12.4)
Monocytes Relative: 8 % (ref 3–12)
Neutro Abs: 4.6 10*3/uL (ref 1.7–7.7)
Neutrophils Relative %: 58 % (ref 43–77)
Platelets: 321 10*3/uL (ref 150–400)
RBC: 4.87 MIL/uL (ref 3.87–5.11)
RDW: 14.8 % (ref 11.5–15.5)
WBC: 8 10*3/uL (ref 4.0–10.5)

## 2015-01-09 LAB — HEPATIC FUNCTION PANEL
ALBUMIN: 3.9 g/dL (ref 3.6–5.1)
ALK PHOS: 106 U/L (ref 33–130)
ALT: 52 U/L — ABNORMAL HIGH (ref 6–29)
AST: 29 U/L (ref 10–35)
BILIRUBIN TOTAL: 0.6 mg/dL (ref 0.2–1.2)
Bilirubin, Direct: 0.1 mg/dL (ref <=0.2)
Indirect Bilirubin: 0.5 mg/dL (ref 0.2–1.2)
Total Protein: 6.4 g/dL (ref 6.1–8.1)

## 2015-01-09 LAB — BASIC METABOLIC PANEL WITH GFR
BUN: 16 mg/dL (ref 7–25)
CALCIUM: 9 mg/dL (ref 8.6–10.4)
CO2: 26 mmol/L (ref 20–31)
CREATININE: 0.77 mg/dL (ref 0.50–1.05)
Chloride: 101 mmol/L (ref 98–110)
GFR, Est Non African American: 88 mL/min (ref >=60)
GLUCOSE: 102 mg/dL — AB (ref 65–99)
Potassium: 4.3 mmol/L (ref 3.5–5.3)
Sodium: 140 mmol/L (ref 135–146)

## 2015-01-09 LAB — TSH: TSH: 1.235 u[IU]/mL (ref 0.350–4.500)

## 2015-01-09 MED ORDER — ACYCLOVIR 5 % EX OINT: 1.0000 "application " | TOPICAL_OINTMENT | Freq: Every day | CUTANEOUS | Status: DC | PRN

## 2015-01-09 MED ORDER — VALACYCLOVIR HCL 500 MG PO TABS: 500.0000 mg | ORAL_TABLET | Freq: Two times a day (BID) | ORAL | Status: DC

## 2015-01-09 NOTE — Progress Notes (Signed)
[name], female   DOB: 1960-06-10, 54 y.o.   MRN: [id number]  Assessment and Plan:  Hypertension:  -Continue medication,  -monitor blood pressure at home.  -Continue DASH diet.   -Reminder to go to the ER if any CP, SOB, nausea, dizziness, severe HA, changes vision/speech, left arm numbness and tingling, and jaw pain.  Cholesterol: -Continue diet and exercise.  -Check cholesterol.   Pre-diabetes: -Continue diet and exercise.  -Check A1C  Vitamin D Def: -check level -continue medications.   Leg Swelling -minimal on exam -likely from minoxidil -compression socks -elevate legs  Continue diet and meds as discussed. Further disposition pending results of labs.  HPI 54 y.o. female  presents for 3 month follow up with hypertension, hyperlipidemia, prediabetes and vitamin D.   Her blood pressure has been controlled at home, today their BP is BP: 138/84 mmHg.   She does workout. She denies chest pain, shortness of breath, dizziness. She does walk and she is trying to get 10,000 steps in per day.   She is on cholesterol medication and denies myalgias. Her cholesterol is at goal. The cholesterol last visit was:   Lab Results  Component Value Date   CHOL 211* 08/19/2014   HDL 32* 08/19/2014   LDLCALC UNABLE TO CALCULATE IF TRIGLYCERIDE OVER 400 mg/dL 08/19/2014   TRIG 445* 08/19/2014   CHOLHDL 6.6 08/19/2014     She has been working on diet and exercise for prediabetes, and denies foot ulcerations, hyperglycemia, hypoglycemia , increased appetite, nausea, paresthesia of the feet, polydipsia, polyuria, visual disturbances, vomiting and weight loss. Last A1C in the office was:  Lab Results  Component Value Date   HGBA1C 5.6 08/19/2014    Patient is on Vitamin D supplement.  Lab Results  Component Value Date   VD25OH 57 09/03/2014      Current Medications:  Current Outpatient Prescriptions on File Prior to Visit  Medication Sig Dispense Refill  . acyclovir  ointment (ZOVIRAX) 5 % Apply 1 application topically daily as needed (breakouts).     Marland Kitchen albuterol (VENTOLIN HFA) 108 (90 BASE) MCG/ACT inhaler TAKE 2 PUFFS EVERY 4 HOURS DAILY AS NEEDED 18 Inhaler 99  . aspirin EC 81 MG tablet Take 1 tablet (81 mg total) by mouth daily.    Marland Kitchen atorvastatin (LIPITOR) 40 MG tablet Take 1/2 to 1 tablet daily or as directed for Cholesterol 90 tablet 1  . cetirizine (ZYRTEC) 10 MG tablet Take 10 mg by mouth daily.     . Cholecalciferol (VITAMIN D PO) Take 1 tablet by mouth daily.    Marland Kitchen EPIDUO 0.1-2.5 % gel Apply 1 application topically as needed (acne).     . hydrochlorothiazide (HYDRODIURIL) 25 MG tablet 1/2 to 1 tablet daily for BP & fluid 90 tablet 1  . lisinopril (PRINIVIL,ZESTRIL) 40 MG tablet TAKE ONE TABLET DAILY FOR BLOOD PRESSURE 90 tablet 1  . metoprolol succinate (TOPROL-XL) 100 MG 24 hr tablet Take 1/2 to 1 tablet daily as directed for BP 90 tablet 1  . minoxidil (LONITEN) 10 MG tablet Take 1/4 to 1/2 tablet daily for BP 30 tablet 1  . montelukast (SINGULAIR) 10 MG tablet TAKE 1 TABLET BY MOUTH DAILY 30 tablet 5  . Omega-3 Fatty Acids (FISH OIL PO) Take 1 capsule by mouth daily.    . Red Yeast Rice Extract (RED YEAST RICE PO) Take 1 tablet by mouth daily.    . sertraline (ZOLOFT) 50 MG tablet TAKE 1 TABLET (50 MG TOTAL)  BY MOUTH DAILY. 30 tablet 3  . valACYclovir (VALTREX) 500 MG tablet Take 500 mg by mouth 2 (two) times daily as needed (for breakouts).     . pregabalin (LYRICA) 50 MG capsule Take 1-2 caps qhs prn hot flashes 21 capsule 0   No current facility-administered medications on file prior to visit.    Medical History:  Past Medical History  Diagnosis Date  . Seasonal allergies   . Irregular periods/menstrual cycles   . Torn meniscus   . Hypertension   . Diverticula, colon   . Allergy   . Asthma   . LBBB (left bundle branch block)   . Cardiomyopathy ([place])     a. Echo 8/16:  EF 40-45%, apical and ant-septal HK, Gr 2 DD  . History of  cardiovascular stress test     Myoview 8/16:  EF 48%, anterior and apical defect c/w breast atten, no ischemia; Low Risk    Allergies: No Known Allergies   Review of Systems:  Review of Systems  Constitutional: Negative for fever, chills and malaise/fatigue.  HENT: Negative for congestion, ear pain and sore throat.   Eyes: Negative.   Respiratory: Negative for cough, shortness of breath and wheezing.   Cardiovascular: Positive for leg swelling. Negative for chest pain and palpitations.  Gastrointestinal: Negative for abdominal pain, diarrhea, constipation, blood in stool and melena.  Genitourinary: Negative.   Skin: Negative.   Neurological: Negative for dizziness, sensory change, loss of consciousness and headaches.  Psychiatric/Behavioral: Negative for depression. The patient is not nervous/anxious and does not have insomnia.     Family history- Review and unchanged  Social history- Review and unchanged  Physical Exam: BP 138/84 mmHg  Pulse 58  Temp(Src) 98 F (36.7 C) (Temporal)  Resp 16  Ht 5\' 2"  (1.575 m)  Wt 138 lb (62.596 kg)  BMI 25.23 kg/m2 Wt Readings from Last 3 Encounters:  01/09/15 138 lb (62.596 kg)  01/01/15 138 lb 9.6 oz (62.869 kg)  10/05/14 137 lb 9.6 oz (62.415 kg)    General Appearance: Well nourished well developed, in no apparent distress. Eyes: PERRLA, EOMs, conjunctiva no swelling or erythema ENT/Mouth: Ear canals normal without obstruction, swelling, erythma, discharge.  TMs normal bilaterally.  Oropharynx moist, clear, without exudate, or postoropharyngeal swelling. Neck: Supple, thyroid normal,no cervical adenopathy  Respiratory: Respiratory effort normal, Breath sounds clear A&P without rhonchi, wheeze, or rale.  No retractions, no accessory usage. Cardio: RRR with no MRGs. Brisk peripheral pulses without edema.  Abdomen: Soft, + BS,  Non tender, no guarding, rebound, hernias, masses. Musculoskeletal: Full ROM, 5/5 strength, Normal gait Skin:  Warm, dry without rashes, lesions, ecchymosis.  Neuro: Awake and oriented X 3, Cranial nerves intact. Normal muscle tone, no cerebellar symptoms. Psych: Normal affect, Insight and Judgment appropriate.    Starlyn Skeans, PA-C 10:56 AM [facility]

## 2015-01-10 LAB — HEMOGLOBIN A1C
HEMOGLOBIN A1C: 5.9 % — AB (ref <5.7)
MEAN PLASMA GLUCOSE: 123 mg/dL — AB (ref <117)

## 2015-01-18 ENCOUNTER — Other Ambulatory Visit: Payer: Self-pay | Admitting: Internal Medicine

## 2015-01-22 ENCOUNTER — Ambulatory Visit: Payer: BLUE CROSS/BLUE SHIELD | Admitting: Cardiovascular Disease

## 2015-02-08 ENCOUNTER — Encounter: Payer: Self-pay | Admitting: Cardiovascular Disease

## 2015-02-08 ENCOUNTER — Ambulatory Visit (INDEPENDENT_AMBULATORY_CARE_PROVIDER_SITE_OTHER): Payer: BLUE CROSS/BLUE SHIELD | Admitting: Cardiovascular Disease

## 2015-02-08 VITALS — BP 140/80 | HR 60 | Ht 62.0 in | Wt 140.8 lb

## 2015-02-08 DIAGNOSIS — I5022 Chronic systolic (congestive) heart failure: Secondary | ICD-10-CM | POA: Diagnosis not present

## 2015-02-08 DIAGNOSIS — I10 Essential (primary) hypertension: Secondary | ICD-10-CM | POA: Diagnosis not present

## 2015-02-08 DIAGNOSIS — Q249 Congenital malformation of heart, unspecified: Secondary | ICD-10-CM | POA: Diagnosis not present

## 2015-02-08 DIAGNOSIS — Q208 Other congenital malformations of cardiac chambers and connections: Secondary | ICD-10-CM

## 2015-02-08 MED ORDER — VALSARTAN 160 MG PO TABS: 160.0000 mg | ORAL_TABLET | Freq: Every day | ORAL | Status: DC

## 2015-02-08 NOTE — Patient Instructions (Signed)
Medication Instructions:  STOP Lisinopril START Valsartan 160 mg once daily - call to report blood pressure in 3 weeks or bring to lab appointment   Labwork: Your physician recommends that you return for lab work in: 3 weeks for basic metabolic panel   Testing/Procedures: Your physician has requested that you have a cardiac MRI. Cardiac MRI uses a computer to create images of your heart as its beating, producing both still and moving pictures of your heart and major blood vessels. For further information please visit [email or web address] Please follow the instruction sheet given to you today for more information.   Follow-Up: Your physician recommends that you schedule a follow-up appointment in: 3 months with Dr. Acie Fredrickson.    If you need a refill on your cardiac medications before your next appointment, please call your pharmacy.   Thank you for choosing [facility]! Christen Bame, RN [phone]

## 2015-02-08 NOTE — Progress Notes (Signed)
Gail Bailey Date of Birth  [birth year]       [facility]    [employer] [address], [address], [place], [place]  [address]   [place], [place]  [address] [phone]     [phone]   Fax  [phone]    Fax [phone]  Problem List: 1. HTN  History of Present Illness:  Gail Bailey is a 55 yo who I have known for years.  She has been under lots of stress recently.  She has been very fatigued.   She is sleeping well.  She's been on Verapamil  for years for headaches. She recently had lisinopril added to her medical regimen. The dose was increased recently up to 40 mg a day.  She may be eating a bit more salty foods recently.  She has cut out her soft drinks.    April 08, 2012: Gail Bailey is doing OK but is fatigued after starting the HCTZ and potassium.   July 05, 2012:  Gail Bailey is doing well.  We stopped the HCTZ and K due to fatigue - her fatigue is much better.  Her BP at home has been well controlled.  She continues to watch her salt and is exercising daily.    Dec. 2, 2014:  Gail Bailey presents today for worsening CP.  She is not walking as much as she used to .  Typical BP is [id number] systolic .  She is on prednisone for sinus infection.  She is not sleeping well. This past Sunday, she had some numbness in her left hand.    February 08, 2015:  Gail Bailey is seen back  For follow-up visit. She is re-married. She had a TIA last fall ( vs. Migraine variant )  [name] and had to have it reattached ( Oct. 5, 2016)  ( she fell / slipped on wet floor)   BP has been elevated  - has been started on some new meds.  No CP   Is in rehab for her detached hamstring . Is gradually getting getter  Has gained some weight .      Current Outpatient Prescriptions on File Prior to Visit  Medication Sig Dispense Refill  . acyclovir ointment (ZOVIRAX) 5 % Apply 1 application topically daily as needed (breakouts). 30 g 0  . albuterol  (VENTOLIN HFA) 108 (90 BASE) MCG/ACT inhaler TAKE 2 PUFFS EVERY 4 HOURS DAILY AS NEEDED 18 Inhaler 99  . aspirin EC 81 MG tablet Take 1 tablet (81 mg total) by mouth daily.    Marland Kitchen atorvastatin (LIPITOR) 40 MG tablet Take 1/2 to 1 tablet daily or as directed for Cholesterol 90 tablet 1  . cetirizine (ZYRTEC) 10 MG tablet Take 10 mg by mouth daily.     . Cholecalciferol (VITAMIN D PO) Take 1 tablet by mouth daily.    Marland Kitchen EPIDUO 0.1-2.5 % gel Apply 1 application topically as needed (acne).     . hydrochlorothiazide (HYDRODIURIL) 25 MG tablet 1/2 to 1 tablet daily for BP & fluid 90 tablet 1  . lisinopril (PRINIVIL,ZESTRIL) 40 MG tablet TAKE ONE TABLET DAILY FOR BLOOD PRESSURE 90 tablet 1  . minoxidil (LONITEN) 10 MG tablet Take 1/4 to 1/2 tablet daily for BP 30 tablet 1  . montelukast (SINGULAIR) 10 MG tablet TAKE 1 TABLET BY MOUTH DAILY 30 tablet 5  . Omega-3 Fatty Acids (FISH OIL PO) Take 1 capsule by mouth daily.    Marland Kitchen  pregabalin (LYRICA) 50 MG capsule Take 1-2 caps qhs prn hot flashes 21 capsule 0  . Red Yeast Rice Extract (RED YEAST RICE PO) Take 1 tablet by mouth daily.    . sertraline (ZOLOFT) 50 MG tablet TAKE 1 TABLET (50 MG TOTAL) BY MOUTH DAILY. 30 tablet 3  . valACYclovir (VALTREX) 500 MG tablet Take 1 tablet (500 mg total) by mouth 2 (two) times daily. 90 tablet 0   No current facility-administered medications on file prior to visit.  HCTZ 12.5 mg a day.  No Known Allergies  Past Medical History  Diagnosis Date  . Seasonal allergies   . Irregular periods/menstrual cycles   . Torn meniscus   . Hypertension   . Diverticula, colon   . Allergy   . Asthma   . LBBB (left bundle branch block)   . Cardiomyopathy ([place])     a. Echo 8/16:  EF 40-45%, apical and ant-septal HK, Gr 2 DD  . History of cardiovascular stress test     Myoview 8/16:  EF 48%, anterior and apical defect c/w breast atten, no ischemia; Low Risk    Past Surgical History  Procedure Laterality Date  . Knee surgery       partial knee replacement right  . Cesarean section      x2  . Tubes in ears  10/12  . Tonsillectomy    . Breast reduction surgery    . Cardiac catheterization      History  Smoking status  . Never Smoker   Smokeless tobacco  . Never Used    History  Alcohol Use  . 3.0 oz/week  . 5 Glasses of wine per week    Family History  Problem Relation Age of Onset  . Colon cancer Neg Hx   . Diabetes Mother   . Heart disease Mother   . Hypertension Mother   . Other Father     MVA  . Asthma Sister   . Asthma Son   . Cancer Maternal Grandmother     Uterine  . Heart attack Paternal Grandmother   . Heart attack Mother 71    Reviw of Systems:  Reviewed in the HPI.  All other systems are negative.  Physical Exam: Blood pressure 140/80, pulse 60, height 5\' 2"  (1.575 m), weight 140 lb 12.8 oz (63.866 kg). General: Well developed, well nourished, in no acute distress.  Head: Normocephalic, atraumatic, sclera non-icteric, mucus membranes are moist,   Neck: Supple. Carotids are 2 + without bruits. No JVD  Lungs: Clear   Heart: RR, normal jS1, S2  Abdomen: Soft, non-tender, non-distended with normal bowel sounds.  I was able to palpitate her abdominal aorta.   Msk:  Strength and tone are normal   Extremities: No clubbing or cyanosis. No edema.  Distal pedal pulses are 2+ and equal    Neuro: CN II - XII intact.  Alert and oriented X 3.   Psych:  Normal   ECG:  Assessment / Plan:   1. HTN -  Her blood pressure remains mildly elevated. We'll discontinue the lisinopril and start her on valsartan 160 mg a day. Check basic medical profile in 3 weeks.  2.  Mild systolic congestive heart failure: Gail Bailey does not really have any significant symptoms of shortness breath or heart failure. She has had several Myoview studies that have revealed an apical defect.   Her echo card gram reveals mildly depressed left ventricle systolic function with an ejection fraction of around 45%  with anteroapical akinesis.     I would like to order a cardiac MRI for further assessment of her left ventricle.   Her blood pressure is mildly elevated. We will discontinue the lisinopril and start her on  Valsartan 160 kg today. I'll see her again in 3 months. We will check a basic medical profile in 3 weeks.    Joel Mericle, Wonda Cheng, MD  02/08/2015 5:36 PM    [place],  [place], [place]  [address] Pager [phone] Phone: [phone]; Fax: [phone]

## 2015-02-11 ENCOUNTER — Encounter: Payer: Self-pay | Admitting: Cardiovascular Disease

## 2015-02-13 ENCOUNTER — Ambulatory Visit (INDEPENDENT_AMBULATORY_CARE_PROVIDER_SITE_OTHER): Payer: BLUE CROSS/BLUE SHIELD

## 2015-02-13 ENCOUNTER — Ambulatory Visit (INDEPENDENT_AMBULATORY_CARE_PROVIDER_SITE_OTHER): Payer: BLUE CROSS/BLUE SHIELD | Admitting: Podiatry

## 2015-02-13 ENCOUNTER — Ambulatory Visit: Payer: BLUE CROSS/BLUE SHIELD

## 2015-02-13 ENCOUNTER — Encounter: Payer: Self-pay | Admitting: Podiatry

## 2015-02-13 VITALS — BP 145/84 | HR 60 | Resp 16 | Ht 61.0 in | Wt 135.0 lb

## 2015-02-13 DIAGNOSIS — M79674 Pain in right toe(s): Secondary | ICD-10-CM

## 2015-02-13 NOTE — Patient Instructions (Signed)
Pre-Operative Instructions  Congratulations, you have decided to take an important step to improving your quality of life.  You can be assured that the doctors of [facility] will be with you every step of the way.  1. Plan to be at the surgery center/hospital at least 1 (one) hour prior to your scheduled time unless otherwise directed by the surgical center/hospital staff.  You must have a responsible adult accompany you, remain during the surgery and drive you home.  Make sure you have directions to the surgical center/hospital and know how to get there on time. 2. For hospital based surgery you will need to obtain a history and physical form from your family physician within 1 month prior to the date of surgery- we will give you a form for you primary physician.  3. We make every effort to accommodate the date you request for surgery.  There are however, times where surgery dates or times have to be moved.  We will contact you as soon as possible if a change in schedule is required.   4. No Aspirin/Ibuprofen for one week before surgery.  If you are on aspirin, any non-steroidal anti-inflammatory medications (Mobic, Aleve, Ibuprofen) you should stop taking it 7 days prior to your surgery.  You make take Tylenol  For pain prior to surgery.  5. Medications- If you are taking daily heart and blood pressure medications, seizure, reflux, allergy, asthma, anxiety, pain or diabetes medications, make sure the surgery center/hospital is aware before the day of surgery so they may notify you which medications to take or avoid the day of surgery. 6. No food or drink after midnight the night before surgery unless directed otherwise by surgical center/hospital staff. 7. No alcoholic beverages 24 hours prior to surgery.  No smoking 24 hours prior to or 24 hours after surgery. 8. Wear loose pants or shorts- loose enough to fit over bandages, boots, and casts. 9. No slip on shoes, sneakers are best. 10. Bring  your boot with you to the surgery center/hospital.  Also bring crutches or a walker if your physician has prescribed it for you.  If you do not have this equipment, it will be provided for you after surgery. 11. If you have not been contracted by the surgery center/hospital by the day before your surgery, call to confirm the date and time of your surgery. 12. Leave-time from work may vary depending on the type of surgery you have.  Appropriate arrangements should be made prior to surgery with your employer. 13. Prescriptions will be provided immediately following surgery by your doctor.  Have these filled as soon as possible after surgery and take the medication as directed. 14. Remove nail polish on the operative foot. 15. Wash the night before surgery.  The night before surgery wash the foot and leg well with the antibacterial soap provided and water paying special attention to beneath the toenails and in between the toes.  Rinse thoroughly with water and dry well with a towel.  Perform this wash unless told not to do so by your physician.  Enclosed: 1 Ice pack (please put in freezer the night before surgery)   1 Hibiclens skin cleaner   Pre-op Instructions  If you have any questions regarding the instructions, do not hesitate to call our office.  [place]: [address] , [place] [address] [phone]  [place]: [address] [place], [place] [address] [phone]  [place]: [address]  [place], [place] [address] [phone]  Dr. Richard   Tuchman DPM, Dr. Norman Regal DPM Dr. Richard Sikora DPM, Dr. M. Todd Hyatt DPM, Dr. Kathryn [place] DPM 

## 2015-02-13 NOTE — Progress Notes (Signed)
   Subjective:    [name], female    DOB: [birth year], 55 y.o.   MRN: [id number]  HPI Patient presents with foot pain in their right foot; 2nd toe. Pt stated, "been wearing a spacer to straighten out toe; Dr. Jacqualyn Posey told pt thought had a ligament issue"; Pt fell last Sept. 2016.   Review of Systems  All other systems reviewed and are negative.      Objective:   Physical Exam        Assessment & Plan:

## 2015-02-13 NOTE — Progress Notes (Signed)
Subjective:     [name], female   DOB: 09/20/1960, 55 y.o.   MRN: [id number]  HPI patient presents stating that she had an injury in October and tore her hamstring tendon right and developed significant digital pathology of the second digit right foot which has gotten worse and increasingly painful. She had to have surgery on her hamstrings which is improving but the toe is increasingly tender for her and continues to lift in the air and create more irritation   Review of Systems  All other systems reviewed and are negative.      Objective:   Physical Exam  Constitutional: She is oriented to person, place, and time.  Cardiovascular: Intact distal pulses.   Musculoskeletal: Normal range of motion.  Neurological: She is oriented to person, place, and time.  Skin: Skin is warm.  Nursing note and vitals reviewed.  neurovascular status intact with muscle strength adequate range of motion within normal limits. 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 Due to rigid contracture and pain and worsening of deformity digital fusion with screw or pin is recommended. Patient wants procedure and at this time I allowed her to read consent form reviewing alternative treatments complications and the fact recovery can take upwards of 6 months. She understands this and wants surgery signed consent form is scheduled for outpatient surgery [facility] specialty surgical center in the next several weeks and is encouraged to call with questions prior to  procedure

## 2015-02-19 ENCOUNTER — Telehealth: Payer: Self-pay | Admitting: *Deleted

## 2015-02-19 NOTE — Telephone Encounter (Signed)
"  I'm currently scheduled for surgery with Dr. Paulla Dolly on February 14.  I want to cancel that I have a family matter that is not going to allow me to do this.  Please confirm that you got this message.  Thank you very much."

## 2015-02-20 NOTE — Telephone Encounter (Signed)
I'm calling to let you know I got your message.  I will cancel your surgery.  Please give [place] a call if you'd like to reschedule.  "Thanks, I'd appreciate it.  I will call."  I called and canceled surgery at the surgical center.

## 2015-02-27 ENCOUNTER — Other Ambulatory Visit (INDEPENDENT_AMBULATORY_CARE_PROVIDER_SITE_OTHER): Payer: BLUE CROSS/BLUE SHIELD | Admitting: *Deleted

## 2015-02-27 DIAGNOSIS — Q249 Congenital malformation of heart, unspecified: Secondary | ICD-10-CM

## 2015-02-27 DIAGNOSIS — Q208 Other congenital malformations of cardiac chambers and connections: Secondary | ICD-10-CM

## 2015-02-27 LAB — BASIC METABOLIC PANEL
BUN: 13 mg/dL (ref 7–25)
CALCIUM: 9 mg/dL (ref 8.6–10.4)
CO2: 26 mmol/L (ref 20–31)
CREATININE: 0.86 mg/dL (ref 0.50–1.05)
Chloride: 102 mmol/L (ref 98–110)
Glucose, Bld: 114 mg/dL — ABNORMAL HIGH (ref 65–99)
Potassium: 3.8 mmol/L (ref 3.5–5.3)
Sodium: 137 mmol/L (ref 135–146)

## 2015-02-27 NOTE — Addendum Note (Signed)
Addended by: Eulis Foster on: 02/27/2015 09:52 AM   Modules accepted: Orders

## 2015-03-04 ENCOUNTER — Ambulatory Visit (HOSPITAL_COMMUNITY)
Admission: RE | Admit: 2015-03-04 | Discharge: 2015-03-04 | Disposition: A | Discharge status: Home / Self Care | Payer: BLUE CROSS/BLUE SHIELD | Source: Ambulatory Visit | Attending: Cardiovascular Disease | Admitting: Cardiovascular Disease

## 2015-03-04 DIAGNOSIS — I5022 Chronic systolic (congestive) heart failure: Secondary | ICD-10-CM | POA: Diagnosis present

## 2015-03-04 DIAGNOSIS — Q208 Other congenital malformations of cardiac chambers and connections: Secondary | ICD-10-CM

## 2015-03-04 DIAGNOSIS — Q249 Congenital malformation of heart, unspecified: Secondary | ICD-10-CM | POA: Insufficient documentation

## 2015-03-04 MED ORDER — GADOBENATE DIMEGLUMINE 529 MG/ML IV SOLN
22.0000 mL | Freq: Once | INTRAVENOUS | Status: AC | PRN
  Administered 2015-03-04: 22 mL via INTRAVENOUS

## 2015-03-07 ENCOUNTER — Other Ambulatory Visit: Payer: BLUE CROSS/BLUE SHIELD

## 2015-03-19 ENCOUNTER — Telehealth: Payer: Self-pay | Admitting: *Deleted

## 2015-03-19 NOTE — Telephone Encounter (Signed)
Pt states she was originally scheduled for surgery with Dr. Paulla Dolly on 02/26/2015, but Gerrit Friends was disputing the foot surgery was related to the fall at the hotel in September 2016.  Pt states she would like to discuss the paperwork that she would like sent to [facility].  I referred the message to Gretta Arab, RN.

## 2015-03-23 ENCOUNTER — Other Ambulatory Visit: Payer: Self-pay | Admitting: Internal Medicine

## 2015-03-30 ENCOUNTER — Other Ambulatory Visit: Payer: Self-pay | Admitting: Internal Medicine

## 2015-04-05 ENCOUNTER — Other Ambulatory Visit: Payer: Self-pay | Admitting: Internal Medicine

## 2015-04-05 DIAGNOSIS — N951 Menopausal and female climacteric states: Secondary | ICD-10-CM

## 2015-04-08 NOTE — Telephone Encounter (Signed)
RX CALLED INTO [facility]. 

## 2015-04-11 ENCOUNTER — Ambulatory Visit: Payer: Self-pay | Admitting: Internal Medicine

## 2015-04-19 ENCOUNTER — Ambulatory Visit: Payer: Self-pay | Admitting: Internal Medicine

## 2015-04-22 ENCOUNTER — Encounter: Payer: Self-pay | Admitting: Internal Medicine

## 2015-04-22 ENCOUNTER — Ambulatory Visit (INDEPENDENT_AMBULATORY_CARE_PROVIDER_SITE_OTHER): Payer: BLUE CROSS/BLUE SHIELD | Admitting: Internal Medicine

## 2015-04-22 VITALS — BP 140/90 | HR 67 | Temp 97.7°F | Resp 16 | Ht 61.0 in | Wt 144.8 lb

## 2015-04-22 DIAGNOSIS — I10 Essential (primary) hypertension: Secondary | ICD-10-CM

## 2015-04-22 MED ORDER — FUROSEMIDE 40 MG PO TABS: 40.0000 mg | ORAL_TABLET | Freq: Every day | ORAL | Status: DC

## 2015-04-22 NOTE — Patient Instructions (Signed)
Please take the lasix 40 mg tablet first thing in the morning to help with blood pressure.  Please make sure you are near a bathroom.  Do not take after 5 pm in the afternoon.  Please avoid salt while taking this medication.  Call the office if you have any side effects from the medication.  Keep a blood pressure log for the next 1-2 weeks.

## 2015-04-22 NOTE — Progress Notes (Signed)
Assessment and Plan:   1. Essential hypertension -cont metoprolol 50 mg in pm -cont valsartan 160 mg in am -start lasix 40 mg in am -cont minoxidil 5 mg in pm -keep BP log -recheck in 2 weeks -if better can think of stopping minoxidil and starting 320 valsartan and stopping minoxidil   HPI 55 y.o.female presents for 1 month follow up of HTN.  She reports that her blood pressure has been increasing significantly.  She reports that she has been having blood pressure increasing to 180/100. Patient reports that they have been doing well.  female is taking their medication.  She is taking minoxidil, valsartan, and metoprolol.  They are having difficulty with their medications.  They report some swelling and hair growth as adverse reactions.  She reports that she stopped HCTZ due to hair loss.  NO CP, SOB, Vision changes, headaches, numbness, or weakness.    Past Medical History  Diagnosis Date  . Seasonal allergies   . Irregular periods/menstrual cycles   . Torn meniscus   . Hypertension   . Diverticula, colon   . Allergy   . Asthma   . LBBB (left bundle branch block)   . Cardiomyopathy ([place])     a. Echo 8/16:  EF 40-45%, apical and ant-septal HK, Gr 2 DD  . History of cardiovascular stress test     Myoview 8/16:  EF 48%, anterior and apical defect c/w breast atten, no ischemia; Low Risk     No Known Allergies    Current Outpatient Prescriptions on File Prior to Visit  Medication Sig Dispense Refill  . acyclovir ointment (ZOVIRAX) 5 % Apply 1 application topically daily as needed (breakouts). 30 g 0  . albuterol (VENTOLIN HFA) 108 (90 BASE) MCG/ACT inhaler TAKE 2 PUFFS EVERY 4 HOURS DAILY AS NEEDED 18 Inhaler 99  . aspirin EC 81 MG tablet Take 1 tablet (81 mg total) by mouth daily.    Marland Kitchen atorvastatin (LIPITOR) 40 MG tablet TAKE 1/2 TO 1 TABLET DAILY OR AS DIRECTED FOR CHOLESTEROL 90 tablet 0  . cetirizine (ZYRTEC) 10 MG tablet Take 10 mg by mouth daily.     . Cholecalciferol  (VITAMIN D PO) Take 1 tablet by mouth daily.    Marland Kitchen EPIDUO 0.1-2.5 % gel Apply 1 application topically as needed (acne).     Marland Kitchen LYRICA 50 MG capsule TAKE ONE CAPSULE BY MOUTH 3 TIMES A DAY AS NEEDED HOT FLASHES 90 capsule 5  . montelukast (SINGULAIR) 10 MG tablet TAKE 1 TABLET BY MOUTH DAILY 30 tablet 5  . Omega-3 Fatty Acids (FISH OIL PO) Take 1 capsule by mouth daily.    . Red Yeast Rice Extract (RED YEAST RICE PO) Take 1 tablet by mouth daily.    . sertraline (ZOLOFT) 50 MG tablet TAKE 1 TABLET (50 MG TOTAL) BY MOUTH DAILY. 90 tablet 1  . valACYclovir (VALTREX) 500 MG tablet Take 1 tablet (500 mg total) by mouth 2 (two) times daily. 90 tablet 0  . valsartan (DIOVAN) 160 MG tablet Take 1 tablet (160 mg total) by mouth daily. 30 tablet 11   No current facility-administered medications on file prior to visit.    ROS: all negative except above.   Physical Exam: Filed Weights   04/22/15 0827  Weight: 144 lb 12.8 oz (65.681 kg)   BP 140/90 mmHg  Pulse 67  Temp(Src) 97.7 F (36.5 C) (Temporal)  Resp 16  Ht 5\' 1"  (1.549 m)  Wt 144 lb 12.8 oz (65.681 kg)  BMI 27.37 kg/m2  SpO2 97% General Appearance: Well developed well nourished, non-toxic appearing in no apparent distress. Eyes: PERRLA, EOMs, conjunctiva w/ no swelling or erythema or discharge Sinuses: No Frontal/maxillary tenderness ENT/Mouth: Ear canals clear without swelling or erythema.  TM's normal bilaterally with no retractions, bulging, or loss of landmarks.   Neck: Supple, thyroid normal, no notable JVD  Respiratory: Respiratory effort normal, Clear breath sounds anteriorly and posteriorly bilaterally without rales, rhonchi, wheezing or stridor. No retractions or accessory muscle usage. Cardio: RRR with no MRGs.   Abdomen: Soft, + BS.  Non tender, no guarding, rebound, hernias, masses.  Musculoskeletal: Full ROM, 5/5 strength, normal gait.  Skin: Warm, dry without rashes  Neuro: Awake and oriented X 3, Cranial nerves intact.  Normal muscle tone, no cerebellar symptoms. Sensation intact.  Psych: normal affect, Insight and Judgment appropriate.     Starlyn Skeans, PA-C 8:58 AM [facility]

## 2015-04-24 ENCOUNTER — Ambulatory Visit: Payer: Self-pay | Admitting: Internal Medicine

## 2015-04-26 ENCOUNTER — Other Ambulatory Visit: Payer: Self-pay | Admitting: Internal Medicine

## 2015-05-01 ENCOUNTER — Encounter: Payer: Self-pay | Admitting: Internal Medicine

## 2015-05-01 ENCOUNTER — Telehealth: Payer: Self-pay | Admitting: *Deleted

## 2015-05-01 ENCOUNTER — Ambulatory Visit (INDEPENDENT_AMBULATORY_CARE_PROVIDER_SITE_OTHER): Payer: BLUE CROSS/BLUE SHIELD | Admitting: Internal Medicine

## 2015-05-01 VITALS — BP 138/82 | HR 64 | Temp 98.0°F | Resp 18 | Ht 61.0 in | Wt 139.0 lb

## 2015-05-01 DIAGNOSIS — I10 Essential (primary) hypertension: Secondary | ICD-10-CM

## 2015-05-01 MED ORDER — FUROSEMIDE 40 MG PO TABS: 40.0000 mg | ORAL_TABLET | Freq: Every day | ORAL | Status: DC

## 2015-05-01 MED ORDER — ADAPALENE-BENZOYL PEROXIDE 0.1-2.5 % EX GEL: 1.0000 "application " | CUTANEOUS | Status: DC | PRN

## 2015-05-01 MED ORDER — ALBUTEROL SULFATE HFA 108 (90 BASE) MCG/ACT IN AERS: INHALATION_SPRAY | RESPIRATORY_TRACT | Status: DC

## 2015-05-01 NOTE — Progress Notes (Signed)
Assessment and Plan:   1. Essential hypertension -cont lasix -BP under much better control -peripheral edema improved -recommended continuing other medications same   HPI 55 y.o.female presents for 1-2 week follow up of HTN.  She was recently started lasix for HTN.  She reports that she thinks that her cuff is not working well.  She is using the quardio which is an application that does her pressures at home.  She thinks that sometimes it is wrong. Patient reports that they have been doing well.  female is taking their medication.  They are having difficulty with their medications.  They report no adverse reactions.    Past Medical History  Diagnosis Date  . Seasonal allergies   . Irregular periods/menstrual cycles   . Torn meniscus   . Hypertension   . Diverticula, colon   . Allergy   . Asthma   . LBBB (left bundle branch block)   . Cardiomyopathy ([place])     a. Echo 8/16:  EF 40-45%, apical and ant-septal HK, Gr 2 DD  . History of cardiovascular stress test     Myoview 8/16:  EF 48%, anterior and apical defect c/w breast atten, no ischemia; Low Risk     No Known Allergies    Current Outpatient Prescriptions on File Prior to Visit  Medication Sig Dispense Refill  . acyclovir ointment (ZOVIRAX) 5 % Apply 1 application topically daily as needed (breakouts). 30 g 0  . aspirin EC 81 MG tablet Take 1 tablet (81 mg total) by mouth daily.    Marland Kitchen atorvastatin (LIPITOR) 40 MG tablet TAKE 1/2 TO 1 TABLET DAILY OR AS DIRECTED FOR CHOLESTEROL 90 tablet 0  . Cholecalciferol (VITAMIN D PO) Take 1 tablet by mouth daily.    . furosemide (LASIX) 40 MG tablet Take 1 tablet (40 mg total) by mouth daily. 30 tablet 11  . LYRICA 50 MG capsule TAKE ONE CAPSULE BY MOUTH 3 TIMES A DAY AS NEEDED HOT FLASHES 90 capsule 5  . minoxidil (LONITEN) 10 MG tablet TAKE 1/4 TO 1/2 TABLET DAILY FOR BLOOD PRESSURE 30 tablet 1  . montelukast (SINGULAIR) 10 MG tablet TAKE 1 TABLET BY MOUTH DAILY 30 tablet 5  .  Omega-3 Fatty Acids (FISH OIL PO) Take 1 capsule by mouth daily.    . Red Yeast Rice Extract (RED YEAST RICE PO) Take 1 tablet by mouth daily.    . sertraline (ZOLOFT) 50 MG tablet TAKE 1 TABLET (50 MG TOTAL) BY MOUTH DAILY. 90 tablet 1  . valACYclovir (VALTREX) 500 MG tablet Take 1 tablet (500 mg total) by mouth 2 (two) times daily. 90 tablet 0  . valsartan (DIOVAN) 160 MG tablet Take 1 tablet (160 mg total) by mouth daily. 30 tablet 11   No current facility-administered medications on file prior to visit.    ROS: all negative except above.   Physical Exam: Filed Weights   05/01/15 1110  Weight: 139 lb (63.05 kg)   BP 138/82 mmHg  Pulse 64  Temp(Src) 98 F (36.7 C) (Temporal)  Resp 18  Ht 5\' 1"  (1.549 m)  Wt 139 lb (63.05 kg)  BMI 26.28 kg/m2 General Appearance: Well developed well nourished, non-toxic appearing in no apparent distress. Eyes: PERRLA, EOMs, conjunctiva w/ no swelling or erythema or discharge Sinuses: No Frontal/maxillary tenderness ENT/Mouth: Ear canals clear without swelling or erythema.  TM's normal bilaterally with no retractions, bulging, or loss of landmarks.   Neck: Supple, thyroid normal, no notable JVD  Respiratory: Respiratory  effort normal, Clear breath sounds anteriorly and posteriorly bilaterally without rales, rhonchi, wheezing or stridor. No retractions or accessory muscle usage. Cardio: RRR with no MRGs.   Abdomen: Soft, + BS.  Non tender, no guarding, rebound, hernias, masses.  Musculoskeletal: Full ROM, 5/5 strength, normal gait.  Skin: Warm, dry without rashes  Neuro: Awake and oriented X 3, Cranial nerves intact. Normal muscle tone, no cerebellar symptoms. Sensation intact.  Psych: normal affect, Insight and Judgment appropriate.     Starlyn Skeans, PA-C 11:30 AM [facility]

## 2015-05-01 NOTE — Telephone Encounter (Signed)
Pt states [facility] has asked for copy of her clinical and she would like to know if they have been sent.

## 2015-05-03 NOTE — Telephone Encounter (Signed)
Called pt and informed her that I had faxed her records twice. Pt asked if it was noted that is was because of an accident/fall in September. I told pt that Dr. Paulla Dolly had noted that in her note of initial visit.

## 2015-05-09 ENCOUNTER — Other Ambulatory Visit: Payer: Self-pay | Admitting: Internal Medicine

## 2015-05-09 ENCOUNTER — Ambulatory Visit (INDEPENDENT_AMBULATORY_CARE_PROVIDER_SITE_OTHER): Payer: BLUE CROSS/BLUE SHIELD | Admitting: Cardiovascular Disease

## 2015-05-09 ENCOUNTER — Encounter: Payer: Self-pay | Admitting: Cardiovascular Disease

## 2015-05-09 VITALS — BP 120/76 | HR 58 | Ht 61.0 in | Wt 141.6 lb

## 2015-05-09 DIAGNOSIS — I10 Essential (primary) hypertension: Secondary | ICD-10-CM

## 2015-05-09 MED ORDER — VALSARTAN 320 MG PO TABS: 320.0000 mg | ORAL_TABLET | Freq: Every day | ORAL | Status: DC

## 2015-05-09 NOTE — Progress Notes (Signed)
Gail Bailey Date of Birth  January 20, 1960       [facility]    [employer] [address], [address], [place], [place]  [address]   [place], [place]  [address] [phone]     [phone]   Fax  [phone]    Fax [phone]  Problem List: 1. HTN  History of Present Illness:  [name] is a 55 yo who I have known for years.  She has been under lots of stress recently.  She has been very fatigued.   She is sleeping well.  She's been on Verapamil  for years for headaches. She recently had lisinopril added to her medical regimen. The dose was increased recently up to 40 mg a day.  She may be eating a bit more salty foods recently.  She has cut out her soft drinks.    April 08, 2012: Gail Bailey is doing OK but is fatigued after starting the HCTZ and potassium.   July 05, 2012:  Gail Bailey is doing well.  We stopped the HCTZ and K due to fatigue - her fatigue is much better.  Her BP at home has been well controlled.  She continues to watch her salt and is exercising daily.    Dec. 2, 2014:  Gail Bailey presents today for worsening CP.  She is not walking as much as she used to .  Typical BP is [id number] systolic .  She is on prednisone for sinus infection.  She is not sleeping well. This past Sunday, she had some numbness in her left hand.    February 08, 2015:  Gail Bailey is seen back  For follow-up visit. She is re-married. She had a TIA last fall ( vs. Migraine variant )  [name] and had to have it reattached ( Oct. 5, 2016)  ( she fell / slipped on wet floor)   BP has been elevated  - has been started on some new meds.  No CP   Is in rehab for her detached hamstring . Is gradually getting getter  Has gained some weight .   May 09, 2015: Doing well Exercising regularly . Still getting PT for her torn hamstring . Walks 10,000 steps a day  BP is elevated at home. Very normal here.    Current Outpatient Prescriptions on File  Prior to Visit  Medication Sig Dispense Refill  . acyclovir ointment (ZOVIRAX) 5 % Apply 1 application topically daily as needed (breakouts). 30 g 0  . Adapalene-Benzoyl Peroxide (EPIDUO) 0.1-2.5 % gel Apply 1 application topically as needed (acne). 45 g 0  . albuterol (VENTOLIN HFA) 108 (90 Base) MCG/ACT inhaler TAKE 2 PUFFS EVERY 4 HOURS DAILY AS NEEDED 18 Inhaler 99  . aspirin EC 81 MG tablet Take 1 tablet (81 mg total) by mouth daily.    Marland Kitchen atorvastatin (LIPITOR) 40 MG tablet TAKE 1/2 TO 1 TABLET DAILY OR AS DIRECTED FOR CHOLESTEROL (Patient taking differently: TAKE 1/2 TO 1 TABLET DAILY OR AS DIRECTED FOR CHOLESTEROL THREE TIMES WEEKLY) 90 tablet 0  . Cholecalciferol (VITAMIN D PO) Take 1 tablet by mouth daily.    . furosemide (LASIX) 40 MG tablet Take 1 tablet (40 mg total) by mouth daily. 30 tablet 11  . LYRICA 50 MG capsule TAKE ONE CAPSULE BY MOUTH 3 TIMES A DAY AS NEEDED HOT FLASHES 90 capsule 5  . metoprolol succinate (TOPROL-XL) 100 MG 24 hr tablet Take 100 mg by  mouth daily. Take with or immediately following a meal.    . minoxidil (LONITEN) 10 MG tablet TAKE 1/4 TO 1/2 TABLET DAILY FOR BLOOD PRESSURE 30 tablet 1  . montelukast (SINGULAIR) 10 MG tablet TAKE 1 TABLET BY MOUTH DAILY 30 tablet 5  . Omega-3 Fatty Acids (FISH OIL PO) Take 1 capsule by mouth daily.    . Red Yeast Rice Extract (RED YEAST RICE PO) Take 1 tablet by mouth daily.    . sertraline (ZOLOFT) 50 MG tablet TAKE 1 TABLET (50 MG TOTAL) BY MOUTH DAILY. 90 tablet 1  . valACYclovir (VALTREX) 500 MG tablet Take 1 tablet (500 mg total) by mouth 2 (two) times daily. (Patient taking differently: Take 500 mg by mouth 2 (two) times daily as needed (AS NEEDED FOR BREAKOUTS). ) 90 tablet 0  . valsartan (DIOVAN) 160 MG tablet Take 1 tablet (160 mg total) by mouth daily. 30 tablet 11   No current facility-administered medications on file prior to visit.  HCTZ 12.5 mg a day.  No Known Allergies  Past Medical History  Diagnosis  Date  . Seasonal allergies   . Irregular periods/menstrual cycles   . Torn meniscus   . Hypertension   . Diverticula, colon   . Allergy   . Asthma   . LBBB (left bundle branch block)   . Cardiomyopathy ([place])     a. Echo 8/16:  EF 40-45%, apical and ant-septal HK, Gr 2 DD  . History of cardiovascular stress test     Myoview 8/16:  EF 48%, anterior and apical defect c/w breast atten, no ischemia; Low Risk    Past Surgical History  Procedure Laterality Date  . Knee surgery      partial knee replacement right  . Cesarean section      x2  . Tubes in ears  10/12  . Tonsillectomy    . Breast reduction surgery    . Cardiac catheterization      History  Smoking status  . Never Smoker   Smokeless tobacco  . Never Used    History  Alcohol Use  . 3.0 oz/week  . 5 Glasses of wine per week    Family History  Problem Relation Age of Onset  . Colon cancer Neg Hx   . Diabetes Mother   . Heart disease Mother   . Hypertension Mother   . Other Father     MVA  . Asthma Sister   . Asthma Son   . Cancer Maternal Grandmother     Uterine  . Heart attack Paternal Grandmother   . Heart attack Mother 16    Reviw of Systems:  Reviewed in the HPI.  All other systems are negative.  Physical Exam: Blood pressure 120/76, pulse 58, height 5\' 1"  (1.549 m), weight 141 lb 9.6 oz (64.229 kg). General: Well developed, well nourished, in no acute distress.  Head: Normocephalic, atraumatic, sclera non-icteric, mucus membranes are moist,  Neck: Supple. Carotids are 2 + without bruits. No JVD Lungs: Clear  Heart: RR, normal jS1, S2 Abdomen: Soft, non-tender, non-distended with normal bowel sounds.  I was able to palpitate her abdominal aorta.  Msk:  Strength and tone are normal  Extremities: No clubbing or cyanosis. No edema.  Distal pedal pulses are 2+ and equal  Neuro: CN II - XII intact.  Alert and oriented X 3.  Psych:  Normal   ECG:  Assessment / Plan:   1. HTN -  Her blood  pressure is  well controlled here - readings at home are little bit elevated. In reviewing how she takes her blood pressure, it seems that she's taking her blood pressure immediately after coming in from work. I have advised her to stop and relax for at least 15 minutes prior to measuring her blood pressure.  She's currently on minoxidil 5 mg a day. I would like to discontinue this. We will increase the valsartan to 320 mg a day.  The myoview suggested a mildly reduced EF.   Cardiac MRI showed a normal EF of 59%.      Nikkole Placzek, Wonda Cheng, MD  05/09/2015 10:28 AM    [address],  [address] [place], [place]  [address] Pager [phone] Phone: [phone]; Fax: [phone]

## 2015-05-09 NOTE — Patient Instructions (Signed)
Medication Instructions:  STOP Minoxidil INCREASE Valsartan to 320 mg daily   Labwork: Your physician recommends that you return for lab work in: 3 weeks for basic metabolic panel   Testing/Procedures: None Ordered   Follow-Up: Your physician recommends that you schedule a follow-up appointment in: 3 months with Dr. Acie Fredrickson.    If you need a refill on your cardiac medications before your next appointment, please call your pharmacy.   Thank you for choosing [facility]! Christen Bame, RN [phone]

## 2015-05-27 ENCOUNTER — Other Ambulatory Visit (INDEPENDENT_AMBULATORY_CARE_PROVIDER_SITE_OTHER): Payer: BLUE CROSS/BLUE SHIELD | Admitting: *Deleted

## 2015-05-27 DIAGNOSIS — I10 Essential (primary) hypertension: Secondary | ICD-10-CM | POA: Diagnosis not present

## 2015-05-27 LAB — BASIC METABOLIC PANEL
BUN: 16 mg/dL (ref 7–25)
CO2: 29 mmol/L (ref 20–31)
Calcium: 9.2 mg/dL (ref 8.6–10.4)
Chloride: 101 mmol/L (ref 98–110)
Creat: 0.82 mg/dL (ref 0.50–1.05)
Glucose, Bld: 119 mg/dL — ABNORMAL HIGH (ref 65–99)
POTASSIUM: 4 mmol/L (ref 3.5–5.3)
Sodium: 140 mmol/L (ref 135–146)

## 2015-07-14 ENCOUNTER — Other Ambulatory Visit: Payer: Self-pay | Admitting: Physician Assistant

## 2015-07-14 ENCOUNTER — Other Ambulatory Visit: Payer: Self-pay | Admitting: Internal Medicine

## 2015-07-22 ENCOUNTER — Other Ambulatory Visit: Payer: Self-pay

## 2015-07-22 MED ORDER — SERTRALINE HCL 50 MG PO TABS: ORAL_TABLET | ORAL | Status: DC

## 2015-08-22 ENCOUNTER — Other Ambulatory Visit: Payer: Self-pay | Admitting: Internal Medicine

## 2015-08-30 ENCOUNTER — Ambulatory Visit: Payer: BLUE CROSS/BLUE SHIELD | Admitting: Cardiovascular Disease

## 2015-09-10 ENCOUNTER — Encounter: Payer: Self-pay | Admitting: Internal Medicine

## 2015-09-13 ENCOUNTER — Ambulatory Visit (INDEPENDENT_AMBULATORY_CARE_PROVIDER_SITE_OTHER): Payer: BLUE CROSS/BLUE SHIELD | Admitting: Internal Medicine

## 2015-09-13 ENCOUNTER — Other Ambulatory Visit: Payer: Self-pay | Admitting: *Deleted

## 2015-09-13 ENCOUNTER — Encounter: Payer: Self-pay | Admitting: Internal Medicine

## 2015-09-13 VITALS — BP 152/86 | HR 56 | Temp 97.7°F | Resp 16 | Ht 61.25 in | Wt 146.6 lb

## 2015-09-13 DIAGNOSIS — R7303 Prediabetes: Secondary | ICD-10-CM

## 2015-09-13 DIAGNOSIS — J4520 Mild intermittent asthma, uncomplicated: Secondary | ICD-10-CM

## 2015-09-13 DIAGNOSIS — Z136 Encounter for screening for cardiovascular disorders: Secondary | ICD-10-CM | POA: Diagnosis not present

## 2015-09-13 DIAGNOSIS — Z1211 Encounter for screening for malignant neoplasm of colon: Secondary | ICD-10-CM

## 2015-09-13 DIAGNOSIS — I10 Essential (primary) hypertension: Secondary | ICD-10-CM

## 2015-09-13 DIAGNOSIS — Z0000 Encounter for general adult medical examination without abnormal findings: Secondary | ICD-10-CM

## 2015-09-13 DIAGNOSIS — Z79899 Other long term (current) drug therapy: Secondary | ICD-10-CM

## 2015-09-13 DIAGNOSIS — R5383 Other fatigue: Secondary | ICD-10-CM

## 2015-09-13 DIAGNOSIS — K921 Melena: Secondary | ICD-10-CM

## 2015-09-13 DIAGNOSIS — Z111 Encounter for screening for respiratory tuberculosis: Secondary | ICD-10-CM | POA: Diagnosis not present

## 2015-09-13 DIAGNOSIS — E559 Vitamin D deficiency, unspecified: Secondary | ICD-10-CM

## 2015-09-13 DIAGNOSIS — E782 Mixed hyperlipidemia: Secondary | ICD-10-CM

## 2015-09-13 DIAGNOSIS — Z0001 Encounter for general adult medical examination with abnormal findings: Secondary | ICD-10-CM

## 2015-09-13 LAB — BASIC METABOLIC PANEL WITH GFR
BUN: 19 mg/dL (ref 7–25)
CALCIUM: 8.8 mg/dL (ref 8.6–10.4)
CO2: 25 mmol/L (ref 20–31)
Chloride: 103 mmol/L (ref 98–110)
Creat: 0.88 mg/dL (ref 0.50–1.05)
GFR, EST AFRICAN AMERICAN: 86 mL/min (ref >=60)
GFR, EST NON AFRICAN AMERICAN: 75 mL/min (ref >=60)
GLUCOSE: 133 mg/dL — AB (ref 65–99)
POTASSIUM: 4.1 mmol/L (ref 3.5–5.3)
SODIUM: 137 mmol/L (ref 135–146)

## 2015-09-13 LAB — HEPATIC FUNCTION PANEL
ALK PHOS: 98 U/L (ref 33–130)
ALT: 23 U/L (ref 6–29)
AST: 18 U/L (ref 10–35)
Albumin: 4 g/dL (ref 3.6–5.1)
BILIRUBIN DIRECT: 0.1 mg/dL (ref <=0.2)
BILIRUBIN INDIRECT: 0.4 mg/dL (ref 0.2–1.2)
BILIRUBIN TOTAL: 0.5 mg/dL (ref 0.2–1.2)
TOTAL PROTEIN: 6.2 g/dL (ref 6.1–8.1)

## 2015-09-13 LAB — LIPID PANEL
CHOL/HDL RATIO: 4.5 ratio (ref <=5.0)
Cholesterol: 178 mg/dL (ref 125–200)
HDL: 40 mg/dL — ABNORMAL LOW (ref >=46)
LDL Cholesterol: 96 mg/dL (ref <130)
Triglycerides: 208 mg/dL — ABNORMAL HIGH (ref <150)
VLDL: 42 mg/dL — ABNORMAL HIGH (ref <30)

## 2015-09-13 LAB — URINALYSIS, ROUTINE W REFLEX MICROSCOPIC
Bilirubin Urine: NEGATIVE
GLUCOSE, UA: NEGATIVE
HGB URINE DIPSTICK: NEGATIVE
KETONES UR: NEGATIVE
Leukocytes, UA: NEGATIVE
Nitrite: NEGATIVE
PH: 6 (ref 5.0–8.0)
Protein, ur: NEGATIVE
SPECIFIC GRAVITY, URINE: 1.006 (ref 1.001–1.035)

## 2015-09-13 LAB — MAGNESIUM: Magnesium: 2 mg/dL (ref 1.5–2.5)

## 2015-09-13 LAB — IRON AND TIBC
%SAT: 38 % (ref 11–50)
Iron: 101 ug/dL (ref 45–160)
TIBC: 264 ug/dL (ref 250–450)
UIBC: 163 ug/dL (ref 125–400)

## 2015-09-13 LAB — CBC WITH DIFFERENTIAL/PLATELET
BASOS PCT: 0 %
Basophils Absolute: 0 cells/uL (ref 0–200)
EOS PCT: 4 %
Eosinophils Absolute: 304 cells/uL (ref 15–500)
HEMATOCRIT: 43.3 % (ref 35.0–45.0)
Hemoglobin: 14.4 g/dL (ref 11.7–15.5)
LYMPHS ABS: 2204 {cells}/uL (ref 850–3900)
LYMPHS PCT: 29 %
MCH: 29.8 pg (ref 27.0–33.0)
MCHC: 33.3 g/dL (ref 32.0–36.0)
MCV: 89.5 fL (ref 80.0–100.0)
MONO ABS: 684 {cells}/uL (ref 200–950)
MPV: 12.7 fL — AB (ref 7.5–12.5)
Monocytes Relative: 9 %
Neutro Abs: 4408 cells/uL (ref 1500–7800)
Neutrophils Relative %: 58 %
Platelets: 289 10*3/uL (ref 140–400)
RBC: 4.84 MIL/uL (ref 3.80–5.10)
RDW: 14.8 % (ref 11.0–15.0)
WBC: 7.6 10*3/uL (ref 3.8–10.8)

## 2015-09-13 MED ORDER — PREGABALIN 100 MG PO CAPS: 100.0000 mg | ORAL_CAPSULE | Freq: Three times a day (TID) | ORAL | 1 refills | Status: DC

## 2015-09-13 MED ORDER — BUPROPION HCL ER (XL) 300 MG PO TB24: 300.0000 mg | ORAL_TABLET | Freq: Every day | ORAL | 1 refills | Status: DC

## 2015-09-13 NOTE — Patient Instructions (Signed)
Preventive Care for Adults  A healthy lifestyle and preventive care can promote health and wellness. Preventive health guidelines for women include the following key practices.  A routine yearly physical is a good way to check with your health care provider about your health and preventive screening. It is a chance to share any concerns and updates on your health and to receive a thorough exam.  Visit your dentist for a routine exam and preventive care every 6 months. Brush your teeth twice a day and floss once a day. Good oral hygiene prevents tooth decay and gum disease.  The frequency of eye exams is based on your age, health, family medical history, use of contact lenses, and other factors. Follow your health care provider's recommendations for frequency of eye exams.  Eat a healthy diet. Foods like vegetables, fruits, whole grains, low-fat dairy products, and lean protein foods contain the nutrients you need without too many calories. Decrease your intake of foods high in solid fats, added sugars, and salt. Eat the right amount of calories for you.Get information about a proper diet from your health care provider, if necessary.  Regular physical exercise is one of the most important things you can do for your health. Most adults should get at least 150 minutes of moderate-intensity exercise (any activity that increases your heart rate and causes you to sweat) each week. In addition, most adults need muscle-strengthening exercises on 2 or more days a week.  Maintain a healthy weight. The body mass index (BMI) is a screening tool to identify possible weight problems. It provides an estimate of body fat based on height and weight. Your health care provider can find your BMI and can help you achieve or maintain a healthy weight.For adults 20 years and older:  A BMI below 18.5 is considered underweight.  A BMI of 18.5 to 24.9 is normal.  A BMI of 25 to 29.9 is considered overweight.  A BMI of  30 and above is considered obese.  Maintain normal blood lipids and cholesterol levels by exercising and minimizing your intake of saturated fat. Eat a balanced diet with plenty of fruit and vegetables. Blood tests for lipids and cholesterol should begin at age 20 and be repeated every 5 years. If your lipid or cholesterol levels are high, you are over 50, or you are at high risk for heart disease, you may need your cholesterol levels checked more frequently.Ongoing high lipid and cholesterol levels should be treated with medicines if diet and exercise are not working.  If you smoke, find out from your health care provider how to quit. If you do not use tobacco, do not start.  Lung cancer screening is recommended for adults aged 55-80 years who are at high risk for developing lung cancer because of a history of smoking. A yearly low-dose CT scan of the lungs is recommended for people who have at least a 30-pack-year history of smoking and are a current smoker or have quit within the past 15 years. A pack year of smoking is smoking an average of 1 pack of cigarettes a day for 1 year (for example: 1 pack a day for 30 years or 2 packs a day for 15 years). Yearly screening should continue until the smoker has stopped smoking for at least 15 years. Yearly screening should be stopped for people who develop a health problem that would prevent them from having lung cancer treatment.  High blood pressure causes heart disease and increases the risk of   stroke. Your blood pressure should be checked at least every 1 to 2 years. Ongoing high blood pressure should be treated with medicines if weight loss and exercise do not work.  If you are 55-79 years old, ask your health care provider if you should take aspirin to prevent strokes.  Diabetes screening involves taking a blood sample to check your fasting blood sugar level. This should be done once every 3 years, after age 45, if you are within normal weight and  without risk factors for diabetes. Testing should be considered at a younger age or be carried out more frequently if you are overweight and have at least 1 risk factor for diabetes.  Breast cancer screening is essential preventive care for women. You should practice "breast self-awareness." This means understanding the normal appearance and feel of your breasts and may include breast self-examination. Any changes detected, no matter how small, should be reported to a health care provider. Women in their 20s and 30s should have a clinical breast exam (CBE) by a health care provider as part of a regular health exam every 1 to 3 years. After age 40, women should have a CBE every year. Starting at age 40, women should consider having a mammogram (breast X-ray test) every year. Women who have a family history of breast cancer should talk to their health care provider about genetic screening. Women at a high risk of breast cancer should talk to their health care providers about having an MRI and a mammogram every year.  Breast cancer gene (BRCA)-related cancer risk assessment is recommended for women who have family members with BRCA-related cancers. BRCA-related cancers include breast, ovarian, tubal, and peritoneal cancers. Having family members with these cancers may be associated with an increased risk for harmful changes (mutations) in the breast cancer genes BRCA1 and BRCA2. Results of the assessment will determine the need for genetic counseling and BRCA1 and BRCA2 testing.  Routine pelvic exams to screen for cancer are no longer recommended for nonpregnant women who are considered low risk for cancer of the pelvic organs (ovaries, uterus, and vagina) and who do not have symptoms. Ask your health care provider if a screening pelvic exam is right for you.  If you have had past treatment for cervical cancer or a condition that could lead to cancer, you need Pap tests and screening for cancer for at least 20  years after your treatment. If Pap tests have been discontinued, your risk factors (such as having a new sexual partner) need to be reassessed to determine if screening should be resumed. Some women have medical problems that increase the chance of getting cervical cancer. In these cases, your health care provider may recommend more frequent screening and Pap tests.  Colorectal cancer can be detected and often prevented. Most routine colorectal cancer screening begins at the age of 50 years and continues through age 75 years. However, your health care provider may recommend screening at an earlier age if you have risk factors for colon cancer. On a yearly basis, your health care provider may provide home test kits to check for hidden blood in the stool. Use of a small camera at the end of a tube, to directly examine the colon (sigmoidoscopy or colonoscopy), can detect the earliest forms of colorectal cancer. Talk to your health care provider about this at age 50, when routine screening begins. Direct exam of the colon should be repeated every 5-10 years through age 75 years, unless early forms of pre-cancerous   polyps or small growths are found.  Hepatitis C blood testing is recommended for all people born from 1945 through 1965 and any individual with known risks for hepatitis C.  Pra  Osteoporosis is a disease in which the bones lose minerals and strength with aging. This can result in serious bone fractures or breaks. The risk of osteoporosis can be identified using a bone density scan. Women ages 65 years and over and women at risk for fractures or osteoporosis should discuss screening with their health care providers. Ask your health care provider whether you should take a calcium supplement or vitamin D to reduce the rate of osteoporosis.  Menopause can be associated with physical symptoms and risks. Hormone replacement therapy is available to decrease symptoms and risks. You should talk to your  health care provider about whether hormone replacement therapy is right for you.  Use sunscreen. Apply sunscreen liberally and repeatedly throughout the day. You should seek shade when your shadow is shorter than you. Protect yourself by wearing long sleeves, pants, a wide-brimmed hat, and sunglasses year round, whenever you are outdoors.  Once a month, do a whole body skin exam, using a mirror to look at the skin on your back. Tell your health care provider of new moles, moles that have irregular borders, moles that are larger than a pencil eraser, or moles that have changed in shape or color.  Stay current with required vaccines (immunizations).  Influenza vaccine. All adults should be immunized every year.  Tetanus, diphtheria, and acellular pertussis (Td, Tdap) vaccine. Pregnant women should receive 1 dose of Tdap vaccine during each pregnancy. The dose should be obtained regardless of the length of time since the last dose. Immunization is preferred during the 27th-36th week of gestation. An adult who has not previously received Tdap or who does not know her vaccine status should receive 1 dose of Tdap. This initial dose should be followed by tetanus and diphtheria toxoids (Td) booster doses every 10 years. Adults with an unknown or incomplete history of completing a 3-dose immunization series with Td-containing vaccines should begin or complete a primary immunization series including a Tdap dose. Adults should receive a Td booster every 10 years.  Varicella vaccine. An adult without evidence of immunity to varicella should receive 2 doses or a second dose if she has previously received 1 dose. Pregnant females who do not have evidence of immunity should receive the first dose after pregnancy. This first dose should be obtained before leaving the health care facility. The second dose should be obtained 4-8 weeks after the first dose.  Human papillomavirus (HPV) vaccine. Females aged 13-26 years  who have not received the vaccine previously should obtain the 3-dose series. The vaccine is not recommended for use in pregnant females. However, pregnancy testing is not needed before receiving a dose. If a female is found to be pregnant after receiving a dose, no treatment is needed. In that case, the remaining doses should be delayed until after the pregnancy. Immunization is recommended for any person with an immunocompromised condition through the age of 26 years if she did not get any or all doses earlier. During the 3-dose series, the second dose should be obtained 4-8 weeks after the first dose. The third dose should be obtained 24 weeks after the first dose and 16 weeks after the second dose.  Zoster vaccine. One dose is recommended for adults aged 60 years or older unless certain conditions are present.  Measles, mumps, and rubella (  MMR) vaccine. Adults born before [birth year] generally are considered immune to measles and mumps. Adults born in [birth year] or later should have 1 or more doses of MMR vaccine unless there is a contraindication to the vaccine or there is laboratory evidence of immunity to each of the three diseases. A routine second dose of MMR vaccine should be obtained at least 28 days after the first dose for students attending postsecondary schools, health care workers, or international travelers. People who received inactivated measles vaccine or an unknown type of measles vaccine during 1963-1967 should receive 2 doses of MMR vaccine. People who received inactivated mumps vaccine or an unknown type of mumps vaccine before 1979 and are at high risk for mumps infection should consider immunization with 2 doses of MMR vaccine. For females of childbearing age, rubella immunity should be determined. If there is no evidence of immunity, females who are not pregnant should be vaccinated. If there is no evidence of immunity, females who are pregnant should delay immunization until after pregnancy.  Unvaccinated health care workers born before [birth year] who lack laboratory evidence of measles, mumps, or rubella immunity or laboratory confirmation of disease should consider measles and mumps immunization with 2 doses of MMR vaccine or rubella immunization with 1 dose of MMR vaccine.  Pneumococcal 13-valent conjugate (PCV13) vaccine. When indicated, a person who is uncertain of her immunization history and has no record of immunization should receive the PCV13 vaccine. An adult aged 39 years or older who has certain medical conditions and has not been previously immunized should receive 1 dose of PCV13 vaccine. This PCV13 should be followed with a dose of pneumococcal polysaccharide (PPSV23) vaccine. The PPSV23 vaccine dose should be obtained at least 8 weeks after the dose of PCV13 vaccine. An adult aged 62 years or older who has certain medical conditions and previously received 1 or more doses of PPSV23 vaccine should receive 1 dose of PCV13. The PCV13 vaccine dose should be obtained 1 or more years after the last PPSV23 vaccine dose.    Pneumococcal polysaccharide (PPSV23) vaccine. When PCV13 is also indicated, PCV13 should be obtained first. All adults aged 67 years and older should be immunized. An adult younger than age 45 years who has certain medical conditions should be immunized. Any person who resides in a nursing home or long-term care facility should be immunized. An adult smoker should be immunized. People with an immunocompromised condition and certain other conditions should receive both PCV13 and PPSV23 vaccines. People with human immunodeficiency virus (HIV) infection should be immunized as soon as possible after diagnosis. Immunization during chemotherapy or radiation therapy should be avoided. Routine use of PPSV23 vaccine is not recommended for American Indians, [place] Natives, or people younger than 65 years unless there are medical conditions that require PPSV23 vaccine. When indicated,  people who have unknown immunization and have no record of immunization should receive PPSV23 vaccine. One-time revaccination 5 years after the first dose of PPSV23 is recommended for people aged 19-64 years who have chronic kidney failure, nephrotic syndrome, asplenia, or immunocompromised conditions. People who received 1-2 doses of PPSV23 before age 23 years should receive another dose of PPSV23 vaccine at age 35 years or later if at least 5 years have passed since the previous dose. Doses of PPSV23 are not needed for people immunized with PPSV23 at or after age 38 years.  Preventive Services / Frequency   Ages 43 to 86 years  Blood pressure check.  Lipid and cholesterol check.  Lung  cancer screening. / Every year if you are aged 55-80 years and have a 30-pack-year history of smoking and currently smoke or have quit within the past 15 years. Yearly screening is stopped once you have quit smoking for at least 15 years or develop a health problem that would prevent you from having lung cancer treatment.  Clinical breast exam.** / Every year after age 40 years.  BRCA-related cancer risk assessment.** / For women who have family members with a BRCA-related cancer (breast, ovarian, tubal, or peritoneal cancers).  Mammogram.** / Every year beginning at age 40 years and continuing for as long as you are in good health. Consult with your health care provider.  Pap test.** / Every 3 years starting at age 30 years through age 65 or 70 years with a history of 3 consecutive normal Pap tests.  HPV screening.** / Every 3 years from ages 30 years through ages 65 to 70 years with a history of 3 consecutive normal Pap tests.  Fecal occult blood test (FOBT) of stool. / Every year beginning at age 50 years and continuing until age 75 years. You may not need to do this test if you get a colonoscopy every 10 years.  Flexible sigmoidoscopy or colonoscopy.** / Every 5 years for a flexible sigmoidoscopy or  every 10 years for a colonoscopy beginning at age 50 years and continuing until age 75 years.  Hepatitis C blood test.** / For all people born from 1945 through 1965 and any individual with known risks for hepatitis C.  Skin self-exam. / Monthly.  Influenza vaccine. / Every year.  Tetanus, diphtheria, and acellular pertussis (Tdap/Td) vaccine.** / Consult your health care provider. Pregnant women should receive 1 dose of Tdap vaccine during each pregnancy. 1 dose of Td every 10 years.  Varicella vaccine.** / Consult your health care provider. Pregnant females who do not have evidence of immunity should receive the first dose after pregnancy.  Zoster vaccine.** / 1 dose for adults aged 60 years or older.  Pneumococcal 13-valent conjugate (PCV13) vaccine.** / Consult your health care provider.  Pneumococcal polysaccharide (PPSV23) vaccine.** / 1 to 2 doses if you smoke cigarettes or if you have certain conditions.  Meningococcal vaccine.** / Consult your health care provider.  Hepatitis A vaccine.** / Consult your health care provider.  Hepatitis B vaccine.** / Consult your health care provider. Screening for abdominal aortic aneurysm (AAA)  by ultrasound is recommended for people over 50 who have history of high blood pressure or who are current or former smokers. ++++++++++++++++++ Recommend Adult Low Dose Aspirin or  coated  Aspirin 81 mg daily  To reduce risk of Colon Cancer 20 %,  Skin Cancer 26 % ,  Melanoma 46%  and  Pancreatic cancer 60% +++++++++++++++++++ Vitamin D goal  is between 70-100.  Please make sure that you are taking your Vitamin D as directed.  It is very important as a natural anti-inflammatory  helping hair, skin, and nails, as well as reducing stroke and heart attack risk.  It helps your bones and helps with mood. It also decreases numerous cancer risks so please take it as directed.  Low Vit D is associated with a 200-300% higher risk for CANCER  and  200-300% higher risk for HEART   ATTACK  &  STROKE.   ...................................... It is also associated with higher death rate at younger ages,  autoimmune diseases like Rheumatoid arthritis, Lupus, Multiple Sclerosis.    Also many other serious conditions, like depression,   Alzheimer's Dementia, infertility, muscle aches, fatigue, fibromyalgia - just to name a few. ++++++++++++++++++ Recommend the book "The END of DIETING" by Dr Joel Fuhrman  & the book "The END of DIABETES " by Dr Joel Fuhrman At [employer] - get book & Audio CD's    Being diabetic has a  300% increased risk for heart attack, stroke, cancer, and alzheimer- type vascular dementia. It is very important that you work harder with diet by avoiding all foods that are white. Avoid white rice (brown & wild rice is OK), white potatoes (sweetpotatoes in moderation is OK), White bread or wheat bread or anything made out of white flour like bagels, donuts, rolls, buns, biscuits, cakes, pastries, cookies, pizza crust, and pasta (made from white flour & egg whites) - vegetarian pasta or spinach or wheat pasta is OK. Multigrain breads like Arnold's or [employer], or multigrain sandwich thins or flatbreads.  Diet, exercise and weight loss can reverse and cure diabetes in the early stages.  Diet, exercise and weight loss is very important in the control and prevention of complications of diabetes which affects every system in your body, ie. Brain - dementia/stroke, eyes - glaucoma/blindness, heart - heart attack/heart failure, kidneys - dialysis, stomach - gastric paralysis, intestines - malabsorption, nerves - severe painful neuritis, circulation - gangrene & loss of a leg(s), and finally cancer and Alzheimers.    I recommend avoid fried & greasy foods,  sweets/candy, white rice (brown or wild rice or Quinoa is OK), white potatoes (sweet potatoes are OK) - anything made from white flour - bagels, doughnuts, rolls, buns, biscuits,white  and wheat breads, pizza crust and traditional pasta made of white flour & egg white(vegetarian pasta or spinach or wheat pasta is OK).  Multi-grain bread is OK - like multi-grain flat bread or sandwich thins. Avoid alcohol in excess. Exercise is also important.    Eat all the vegetables you want - avoid meat, especially red meat and dairy - especially cheese.  Cheese is the most concentrated form of trans-fats which is the worst thing to clog up our arteries. Veggie cheese is OK which can be found in the fresh produce section at [employer] or [employer] or [employer]  ++++++++++++++++++++++ DASH Eating Plan  DASH stands for "Dietary Approaches to Stop Hypertension."   The DASH eating plan is a healthy eating plan that has been shown to reduce high blood pressure (hypertension). Additional health benefits may include reducing the risk of type 2 diabetes mellitus, heart disease, and stroke. The DASH eating plan may also help with weight loss. WHAT DO I NEED TO KNOW ABOUT THE DASH EATING PLAN? For the DASH eating plan, you will follow these general guidelines:  Choose foods with a percent daily value for sodium of less than 5% (as listed on the food label).  Use salt-free seasonings or herbs instead of table salt or sea salt.  Check with your health care provider or pharmacist before using salt substitutes.  Eat lower-sodium products, often labeled as "lower sodium" or "no salt added."  Eat fresh foods.  Eat more vegetables, fruits, and low-fat dairy products.  Choose whole grains. Look for the word "whole" as the first word in the ingredient list.  Choose fish   Limit sweets, desserts, sugars, and sugary drinks.  Choose heart-healthy fats.  Eat veggie cheese   Eat more home-cooked food and less restaurant, buffet, and fast food.  Limit fried foods.  Cook foods using methods other than frying.  Limit canned   vegetables. If you do use them, rinse them well to decrease the  sodium.  When eating at a restaurant, ask that your food be prepared with less salt, or no salt if possible.                      WHAT FOODS CAN I EAT? Read Dr Joel Fuhrman's books on The End of Dieting & The End of Diabetes  Grains Whole grain or whole wheat bread. Brown rice. Whole grain or whole wheat pasta. Quinoa, bulgur, and whole grain cereals. Low-sodium cereals. Corn or whole wheat flour tortillas. Whole grain cornbread. Whole grain crackers. Low-sodium crackers.  Vegetables Fresh or frozen vegetables (raw, steamed, roasted, or grilled). Low-sodium or reduced-sodium tomato and vegetable juices. Low-sodium or reduced-sodium tomato sauce and paste. Low-sodium or reduced-sodium canned vegetables.   Fruits All fresh, canned (in natural juice), or frozen fruits.  Protein Products  All fish and seafood.  Dried beans, peas, or lentils. Unsalted nuts and seeds. Unsalted canned beans.  Dairy Low-fat dairy products, such as skim or 1% milk, 2% or reduced-fat cheeses, low-fat ricotta or cottage cheese, or plain low-fat yogurt. Low-sodium or reduced-sodium cheeses.  Fats and Oils Tub margarines without trans fats. Light or reduced-fat mayonnaise and salad dressings (reduced sodium). Avocado. Safflower, olive, or canola oils. Natural peanut or almond butter.  Other Unsalted popcorn and pretzels. The items listed above may not be a complete list of recommended foods or beverages. Contact your dietitian for more options.  ++++++++++++++++++  WHAT FOODS ARE NOT RECOMMENDED? Grains/ White flour or wheat flour White bread. White pasta. White rice. Refined cornbread. Bagels and croissants. Crackers that contain trans fat.  Vegetables  Creamed or fried vegetables. Vegetables in a . Regular canned vegetables. Regular canned tomato sauce and paste. Regular tomato and vegetable juices.  Fruits Dried fruits. Canned fruit in light or heavy syrup. Fruit juice.  Meat and Other Protein  Products Meat in general - RED meat & White meat.  Fatty cuts of meat. Ribs, chicken wings, all processed meats as bacon, sausage, bologna, salami, fatback, hot dogs, bratwurst and packaged luncheon meats.  Dairy Whole or 2% milk, cream, half-and-half, and cream cheese. Whole-fat or sweetened yogurt. Full-fat cheeses or blue cheese. Non-dairy creamers and whipped toppings. Processed cheese, cheese spreads, or cheese curds.  Condiments Onion and garlic salt, seasoned salt, table salt, and sea salt. Canned and packaged gravies. Worcestershire sauce. Tartar sauce. Barbecue sauce. Teriyaki sauce. Soy sauce, including reduced sodium. Steak sauce. Fish sauce. Oyster sauce. Cocktail sauce. Horseradish. Ketchup and mustard. Meat flavorings and tenderizers. Bouillon cubes. Hot sauce. Tabasco sauce. Marinades. Taco seasonings. Relishes.  Fats and Oils Butter, stick margarine, lard, shortening and bacon fat. Coconut, palm kernel, or palm oils. Regular salad dressings.  Pickles and olives. Salted popcorn and pretzels.  The items listed above may not be a complete list of foods and beverages to avoid.    

## 2015-09-13 NOTE — Progress Notes (Signed)
[facility]                   Unk Pinto, M.D.    Uvaldo Bristle. Silverio Lay, P.A.-C      Starlyn Skeans, P.A.-C  [address] [place] [id number] Telephone [phone] Telefax [phone]   Annual Screening/Preventative Visit  And Comprehensive Evaluation &  Examination     This very nice 54y.o.MWF presents for a Wellness/Preventative Visit & comprehensive evaluation and management of multiple medical co-morbidities.  Patient has been followed for HTN, Prediabetes, Hyperlipidemia and Vitamin D Deficiency.      HTN predates since circa 2014. Patient's BP has been controlled at home and patient denies any cardiac symptoms as chest pain, palpitations, shortness of breath, dizziness or ankle swelling. Today's BP is sl elevated at 152/86 and was reck'd at 138/84.      Patient's hyperlipidemia is controlled with diet and medications. Patient denies myalgias or other medication SE's. Last lipids were at goal albeit elevated Trig's:  Lab Results  Component Value Date   CHOL 178 09/13/2015   HDL 40 (L) 09/13/2015   LDLCALC 96 09/13/2015   TRIG 208 (H) 09/13/2015   CHOLHDL 4.5 09/13/2015      Patient has prediabetes predating since 2012 with A1c 6.0% and patient denies reactive hypoglycemic symptoms, visual blurring, diabetic polys, or paresthesias. Last A1c was still not at goal: Lab Results  Component Value Date   HGBA1C 5.7 (H) 09/13/2015      Finally, patient has history of Vitamin D Deficiency of "48" on treatment in 2008 and last Vitamin D was still below recommended 70-100 range: Lab Results  Component Value Date   VD25OH 54 09/13/2015   Current Outpatient Prescriptions on File Prior to Visit  Medication Sig  . acyclovir ointment  5 % Apply 1 application topically daily as needed (breakouts).  . EPIDUO 0.1-2.5 % gel Apply 1 application topically as needed (acne).   . VENTOLIN HFA inhaler TAKE 2 PUFFS EVERY 4 HOURS DAILY AS NEEDED  . aspirin EC 81 MG Take 1 tablet (81 mg total) by mouth daily.  Marland Kitchen atorvastatin  40 MG  TAKE 1/2 TO 1 TABLET DAILY   . VITAMIN D Take 1 tablet by mouth daily.  . furosemide 40 MG  Take 1 tablet (40 mg total) by mouth daily.  Marland Kitchen LYRICA 50 MG TAKE ONE CAP 3 TIMES A DAY AS NEEDED HOT FLASHES  . metoprolol succinate-XL 100 MG  TAKE 1/2 TO 1 TABLET DAILY AS DIRECTED FOR BP  . minoxidil  10 MG  TAKE 1/4 TO 1/2 TABLET DAILY FOR BLOOD PRESSURE  . montelukast  10 MG  TAKE 1 TAB DAILY  . Omega-3  FISH OIL  Take 1 cap daily.  . Red Yeast Rice Extract  Take 1 tab daily.  . sertraline  50 MG  TAKE 1 TAB DAILY.  . valACYclovir  500 MG Take  2  times daily as needed FOR BREAKOUTS  . valsartan 320 MG  Take 1 tab daily.   No Known Allergies   Past Medical History:  Diagnosis Date  . Allergy   . Asthma   . Cardiomyopathy ([place])    a. Echo 8/16:  EF 40-45%, apical and ant-septal HK, Gr 2 DD  .  Diverticula, colon   . History of cardiovascular stress test    Myoview 8/16:  EF 48%, anterior and apical defect c/w breast atten, no ischemia; Low Risk  . Hypertension   . Irregular periods/menstrual cycles   . LBBB (left bundle branch block)   . Seasonal allergies   . Torn meniscus    Health Maintenance  Topic Date Due  . INFLUENZA VACCINE  08/13/2015  . MAMMOGRAM  04/26/2016  . PAP SMEAR  02/07/2017  . COLONOSCOPY  08/24/2022  . TETANUS/TDAP  01/08/2025  . Hepatitis C Screening  Completed  . HIV Screening  Completed   Immunization History  Administered Date(s) Administered  . Influenza Split 10/24/2013  . PPD Test 06/13/2013, 09/13/2015  . Pneumococcal Polysaccharide-23 02/12/2004  . Td 10/12/2004  . Tdap 01/09/2015   Past Surgical History:  Procedure Laterality Date  . BREAST REDUCTION SURGERY    . CARDIAC CATHETERIZATION    . CESAREAN SECTION     x2  . KNEE SURGERY     partial knee replacement right  . TONSILLECTOMY     . tubes in ears  10/12   Family History  Problem Relation Age of Onset  . Diabetes Mother   . Heart disease Mother   . Hypertension Mother   . Heart attack Mother 65  . Other Father     MVA  . Asthma Sister   . Asthma Son   . Cancer Maternal Grandmother     Uterine  . Heart attack Paternal Grandmother   . Colon cancer Neg Hx    Social History  Substance Use Topics  . Smoking status: Never Smoker  . Smokeless tobacco: Never Used  . Alcohol use 3.0 oz/week    5 Glasses of wine per week    ROS Constitutional: Denies fever, chills, weight loss/gain, headaches, insomnia,  night sweats, and change in appetite. Does c/o fatigue. Eyes: Denies redness, blurred vision, diplopia, discharge, itchy, watery eyes.  ENT: Denies discharge, congestion, post nasal drip, epistaxis, sore throat, earache, hearing loss, dental pain, Tinnitus, Vertigo, Sinus pain, snoring.  Cardio: Denies chest pain, palpitations, irregular heartbeat, syncope, dyspnea, diaphoresis, orthopnea, PND, claudication, edema Respiratory: denies cough, dyspnea, DOE, pleurisy, hoarseness, laryngitis, wheezing.  Gastrointestinal: Denies dysphagia, heartburn, reflux, water brash, pain, cramps, nausea, vomiting, bloating, diarrhea, constipation, hematemesis, melena, hematochezia, jaundice, hemorrhoids Genitourinary: Denies dysuria, frequency, urgency, nocturia, hesitancy, discharge, hematuria, flank pain Breast: Breast lumps, nipple discharge, bleeding.  Musculoskeletal: Denies arthralgia, myalgia, stiffness, Jt. Swelling, pain, limp, and strain/sprain. Denies falls. Skin: Denies puritis, rash, hives, warts, acne, eczema, changing in skin lesion Neuro: No weakness, tremor, incoordination, spasms, paresthesia, pain Psychiatric: Denies confusion, memory loss, sensory loss. Denies Depression. Endocrine: Denies change in weight, skin, hair change, nocturia, and paresthesia, diabetic polys, visual blurring, hyper / hypo glycemic  episodes.  Heme/Lymph: No excessive bleeding, bruising, enlarged lymph nodes.  Physical Exam  BP  152/86 & reck'd at 138/84 / Pulse  56   Temp 97.7 F (36.5 C)   Resp 16   Ht 5' 1.25" (1.556 m)   Wt 146 lb 9.6 oz (66.5 kg)   BMI 27.47 kg/m   General Appearance: Well nourished and in no apparent distress.  Eyes: PERRLA, EOMs, conjunctiva no swelling or erythema, normal fundi and vessels. Sinuses: No frontal/maxillary tenderness ENT/Mouth: EACs patent / TMs  nl. Nares clear without erythema, swelling, mucoid exudates. Oral hygiene is good. No erythema, swelling, or exudate. Tongue normal, non-obstructing. Tonsils not swollen or erythematous. Hearing normal.  Neck:  Supple, thyroid normal. No bruits, nodes or JVD. Respiratory: Respiratory effort normal.  BS equal and clear bilateral without rales, rhonci, wheezing or stridor. Cardio: Heart sounds are normal with regular rate and rhythm and no murmurs, rubs or gallops. Peripheral pulses are normal and equal bilaterally without edema. No aortic or femoral bruits. Chest: symmetric with normal excursions and percussion. Breasts: Symmetric, without lumps, nipple discharge, retractions, or fibrocystic changes.  Abdomen: Flat, soft with bowel sounds active. Nontender, no guarding, rebound, hernias, masses, or organomegaly.  Lymphatics: Non tender without lymphadenopathy.  Genitourinary:  Musculoskeletal: Full ROM all peripheral extremities, joint stability, 5/5 strength, and normal gait. Skin: Warm and dry without rashes, lesions, cyanosis, clubbing or  ecchymosis.  Neuro: Cranial nerves intact, reflexes equal bilaterally. Normal muscle tone, no cerebellar symptoms. Sensation intact.  Pysch: Alert and oriented X 3, normal affect, Insight and Judgment appropriate.   Assessment and Plan  1. Encounter for general adult medical examination with abnormal findings  - Microalbumin / creatinine urine ratio - EKG 12-Lead - POC Hemoccult Bld/Stl  -  Urinalysis, Routine w reflex microscopic - Vitamin B12 - Iron and TIBC - CBC with Differential/Platelet - BASIC METABOLIC PANEL WITH GFR - Hepatic function panel - Magnesium - Lipid panel - TSH - Hemoglobin A1c - Insulin, random - VITAMIN D 25 Hydroxy   2. Essential hypertension  - Microalbumin / creatinine urine ratio - EKG 12-Lead - TSH  3. Hyperlipidemia  - Lipid panel - TSH  4. Prediabetes  - Hemoglobin A1c - Insulin, random  5. Vitamin D deficiency  - VITAMIN D 25 Hydroxy   6. Asthma, mild intermittent, uncomplicated   7. Encounter for long-term (current) use of medications   8. Screening for ischemic heart disease   9. Colon cancer screening  - POC Hemoccult Bld/Stl   10. Other fatigue  - Vitamin B12 - Iron and TIBC - CBC with Differential/Platelet - TSH  11. Medication management  - Urinalysis, Routine w reflex microscopic  - CBC with Differential/Platelet - BASIC METABOLIC PANEL WITH GFR - Hepatic function panel - Magnesium  12. Screening examination for pulmonary tuberculosis  - PPD     Continue prudent diet as discussed, weight control, BP monitoring, regular exercise, and medications. Discussed med's effects and SE's. Screening labs and tests as requested with regular follow-up as recommended. Over 40 minutes of exam, counseling, chart review and high complex critical decision making was performed.

## 2015-09-14 LAB — MICROALBUMIN / CREATININE URINE RATIO: CREATININE, URINE: 20 mg/dL (ref 20–320)

## 2015-09-14 LAB — HEMOGLOBIN A1C
HEMOGLOBIN A1C: 5.7 % — AB (ref <5.7)
Mean Plasma Glucose: 117 mg/dL

## 2015-09-14 LAB — VITAMIN B12: Vitamin B-12: 397 pg/mL (ref 200–1100)

## 2015-09-14 LAB — TSH: TSH: 1.09 m[IU]/L

## 2015-09-14 LAB — INSULIN, RANDOM: Insulin: 9.1 u[IU]/mL (ref 2.0–19.6)

## 2015-09-14 LAB — VITAMIN D 25 HYDROXY (VIT D DEFICIENCY, FRACTURES): Vit D, 25-Hydroxy: 54 ng/mL (ref 30–100)

## 2015-09-18 ENCOUNTER — Emergency Department (HOSPITAL_COMMUNITY): Payer: BLUE CROSS/BLUE SHIELD

## 2015-09-18 ENCOUNTER — Encounter (HOSPITAL_BASED_OUTPATIENT_CLINIC_OR_DEPARTMENT_OTHER): Payer: Self-pay | Admitting: *Deleted

## 2015-09-18 ENCOUNTER — Emergency Department (HOSPITAL_BASED_OUTPATIENT_CLINIC_OR_DEPARTMENT_OTHER)
Admission: EM | Admit: 2015-09-18 | Discharge: 2015-09-18 | Disposition: A | Discharge status: Home / Self Care | Payer: BLUE CROSS/BLUE SHIELD | Attending: Emergency Medicine | Admitting: Emergency Medicine

## 2015-09-18 ENCOUNTER — Other Ambulatory Visit: Payer: Self-pay | Admitting: Internal Medicine

## 2015-09-18 ENCOUNTER — Emergency Department (HOSPITAL_BASED_OUTPATIENT_CLINIC_OR_DEPARTMENT_OTHER): Payer: BLUE CROSS/BLUE SHIELD

## 2015-09-18 DIAGNOSIS — Z79899 Other long term (current) drug therapy: Secondary | ICD-10-CM | POA: Diagnosis not present

## 2015-09-18 DIAGNOSIS — I10 Essential (primary) hypertension: Secondary | ICD-10-CM | POA: Insufficient documentation

## 2015-09-18 DIAGNOSIS — R51 Headache: Secondary | ICD-10-CM | POA: Diagnosis present

## 2015-09-18 DIAGNOSIS — R519 Headache, unspecified: Secondary | ICD-10-CM

## 2015-09-18 DIAGNOSIS — G43109 Migraine with aura, not intractable, without status migrainosus: Secondary | ICD-10-CM

## 2015-09-18 DIAGNOSIS — R410 Disorientation, unspecified: Secondary | ICD-10-CM | POA: Diagnosis not present

## 2015-09-18 DIAGNOSIS — Z8673 Personal history of transient ischemic attack (TIA), and cerebral infarction without residual deficits: Secondary | ICD-10-CM | POA: Insufficient documentation

## 2015-09-18 HISTORY — DX: Transient cerebral ischemic attack, unspecified: G45.9

## 2015-09-18 LAB — CBC WITH DIFFERENTIAL/PLATELET
BASOS ABS: 0 10*3/uL (ref 0.0–0.1)
BASOS PCT: 0 %
EOS PCT: 3 %
Eosinophils Absolute: 0.2 10*3/uL (ref 0.0–0.7)
HCT: 42.9 % (ref 36.0–46.0)
Hemoglobin: 14.2 g/dL (ref 12.0–15.0)
LYMPHS PCT: 34 %
Lymphs Abs: 3 10*3/uL (ref 0.7–4.0)
MCH: 30.3 pg (ref 26.0–34.0)
MCHC: 33.1 g/dL (ref 30.0–36.0)
MCV: 91.5 fL (ref 78.0–100.0)
MONO ABS: 0.8 10*3/uL (ref 0.1–1.0)
MONOS PCT: 9 %
Neutro Abs: 4.8 10*3/uL (ref 1.7–7.7)
Neutrophils Relative %: 54 %
PLATELETS: 281 10*3/uL (ref 150–400)
RBC: 4.69 MIL/uL (ref 3.87–5.11)
RDW: 13.9 % (ref 11.5–15.5)
WBC: 8.8 10*3/uL (ref 4.0–10.5)

## 2015-09-18 LAB — BASIC METABOLIC PANEL
ANION GAP: 8 (ref 5–15)
BUN: 14 mg/dL (ref 6–20)
CALCIUM: 8.9 mg/dL (ref 8.9–10.3)
CO2: 26 mmol/L (ref 22–32)
CREATININE: 0.89 mg/dL (ref 0.44–1.00)
Chloride: 103 mmol/L (ref 101–111)
GFR calc Af Amer: >60 mL/min (ref >60)
GLUCOSE: 159 mg/dL — AB (ref 65–99)
Potassium: 3.2 mmol/L — ABNORMAL LOW (ref 3.5–5.1)
Sodium: 137 mmol/L (ref 135–145)

## 2015-09-18 LAB — SEDIMENTATION RATE: Sed Rate: 5 mm/hr (ref 0–22)

## 2015-09-18 MED ORDER — DIPHENHYDRAMINE HCL 25 MG PO CAPS
25.0000 mg | ORAL_CAPSULE | Freq: Once | ORAL | Status: AC
  Administered 2015-09-18: 25 mg via ORAL
  Filled 2015-09-18: qty 1

## 2015-09-18 MED ORDER — MORPHINE SULFATE (PF) 4 MG/ML IV SOLN: 4.0000 mg | INTRAVENOUS | Status: DC | PRN

## 2015-09-18 MED ORDER — PROCHLORPERAZINE EDISYLATE 5 MG/ML IJ SOLN
10.0000 mg | Freq: Once | INTRAMUSCULAR | Status: AC
  Administered 2015-09-18: 10 mg via INTRAVENOUS
  Filled 2015-09-18: qty 2

## 2015-09-18 MED ORDER — ONDANSETRON HCL 4 MG/2ML IJ SOLN: 4.0000 mg | Freq: Once | INTRAMUSCULAR | Status: DC

## 2015-09-18 NOTE — ED Notes (Signed)
MD at bedside. 

## 2015-09-18 NOTE — ED Triage Notes (Addendum)
Pt reports Headache since 1000 this morning. Pt denies taking any medications. Also reports she has had difficulty "getting her words out" since yesterday. Pt has Hx of TIA. Pt reports having difficulty "getting things done" at work today. Reports swelling to left side of face. States "my mouth feels funny". Husband reports episode earlier today at store where she went to checkout line prior to putting anything in the shopping cart. Facial symmetry presents, grips equal, no drift

## 2015-09-18 NOTE — ED Provider Notes (Signed)
Patient sent in transfer from [facility]. Assumed care around 10pm. Please see prior documentation by Dr. Jeneen Rinks for full H&P. Briefly, 55 yo F with hx migraines, TIA, cardiomyopathy, HTN, asthma that presented with headache, photophobia, mild cognitive symptoms. Also some mild left sided facial swelling that is resolving. Improving HA now, no concern for aneurysmal / intracranial bleed or CNS infectious process. Sent for MRI to rule out CVA. Otherwise, primary differential is migraine variant.   MRI obtained here- reviewed by me. Notable for no acute findings, normal MRI. Pt states sx improving. She was given migraine cocktail. She already has an upcoming appointment with Neurology. She is feeling better and well enough to go home. Discharged in stable condition with return precautions, outpatient follow up.   Case discussed with Dr. Venora Maples, who oversaw management of this patient.    Ivin Booty, MD 09/18/15 [place], MD 09/19/15 [phone]

## 2015-09-18 NOTE — ED Notes (Signed)
Patient from [facility], her for MRI.  Patient did have TIA last August.  Patient has not been feeling right and has been having some issues getting words out.   NIH 1 per [facility].  Patient is CAOx4.

## 2015-09-18 NOTE — ED Notes (Signed)
patient offered pain and nausea medications. The patient reports that her headache is not that bad and she is not nauseated. Patient told she can have them at any time.

## 2015-09-18 NOTE — ED Notes (Signed)
Patient transported to MRI 

## 2015-09-18 NOTE — ED Notes (Signed)
Patient transported to CT 

## 2015-09-18 NOTE — ED Notes (Signed)
Patient returned from MRI.

## 2015-09-18 NOTE — ED Notes (Signed)
Dr. Jeneen Rinks made aware of pt status

## 2015-09-18 NOTE — ED Notes (Signed)
Headache all day-worse prior to arriving to ED. "Trouble getting words out." Head feels heavy. Left side face more numb that right.

## 2015-09-18 NOTE — ED Provider Notes (Signed)
[facility] Provider Note   CSN: [id number] Arrival date & time: 09/18/15  1823     History   Chief Complaint Chief Complaint  Patient presents with  . Headache    HPI Gail Bailey is a 55 y.o. female.  She presents with complaint of confusion, facial numbness, and headache.  She relates her headache to last night. She had a mild headache. She waking this morning her headache recurred approximately 10 AM. Since that time she states she's had some difficulty with concentrating. She reports the left side of her face is numb. Headache is been slow in onset. It is not severe but did worsen over the last hour has become photophobic. No thunderclap event. No neck pain or stiffness. She states that she is chewing and swallowing normal. Her vision is normal. No numbness or weakness to the extremities. Normal gait.  States that she was at a store today returning an item. She then got a carton when around the store without difficulty. She then waited in line for 10 minutes before I and 4 other people. When she got the frontal and she had nothing in her cart. She cannot recall if she had selected thing or intended to. She states that she has had difficulty stating words today. Husband states that on the way here she was talking to him.  He states he was having trouble understanding her because she was substituting words.  Patient had a similar episode one year ago. This was on her wedding day. She leaned over to pick something up. When she did she develop chest pain and a headache. She was seen and evaluated and had an MRI. Was discharged 2 days later and told she had a TIA. Her headache resolved.  HPI  Past Medical History:  Diagnosis Date  . Allergy   . Asthma   . Cardiomyopathy ([place])    a. Echo 8/16:  EF 40-45%, apical and ant-septal HK, Gr 2 DD  . Diverticula, colon   . History of cardiovascular stress test    Myoview 8/16:  EF 48%, anterior and apical defect c/w breast atten,  no ischemia; Low Risk  . Hypertension   . Irregular periods/menstrual cycles   . LBBB (left bundle branch block)   . Seasonal allergies   . TIA (transient ischemic attack)   . Torn meniscus     Patient Active Problem List   Diagnosis Date Noted  . Encounter for general adult medical examination with abnormal findings 09/13/2015  . Chronic systolic CHF (congestive heart failure) ([place]) 02/08/2015  . BMI 25.0-25.9,adult 10/07/2014  . Prediabetes 09/03/2014  . Systolic and diastolic CHF, acute ([place]) 08/20/2014  . TIA (transient ischemic attack) 08/19/2014  . Encounter for long-term (current) use of medications 06/13/2013  . Asthma   . Hyperlipidemia 01/02/2013  . Vitamin D deficiency 01/02/2013  . HTN 03/07/2012    Past Surgical History:  Procedure Laterality Date  . BREAST REDUCTION SURGERY    . CARDIAC CATHETERIZATION    . CESAREAN SECTION     x2  . KNEE SURGERY     partial knee replacement right  . REPAIR QUADRICEPS / HAMSTRING MUSCLE    . TONSILLECTOMY    . tubes in ears  10/12    OB History    No data available       Home Medications    Prior to Admission medications   Medication Sig Start Date End Date Taking? Authorizing Provider  acyclovir ointment (ZOVIRAX) 5 %  Apply 1 application topically daily as needed (breakouts). 01/09/15  Yes Courtney Forcucci, PA-C  Adapalene-Benzoyl Peroxide (EPIDUO) 0.1-2.5 % gel Apply 1 application topically as needed (acne). 05/01/15  Yes Courtney Forcucci, PA-C  albuterol (VENTOLIN HFA) 108 (90 Base) MCG/ACT inhaler TAKE 2 PUFFS EVERY 4 HOURS DAILY AS NEEDED 05/01/15  Yes Unk Pinto, MD  aspirin EC 81 MG tablet Take 1 tablet (81 mg total) by mouth daily. 08/22/14  Yes Liliane Shi, PA-C  atorvastatin (LIPITOR) 40 MG tablet TAKE 1/2 TO 1 TABLET DAILY OR AS DIRECTED FOR CHOLESTEROL 07/15/15  Yes Unk Pinto, MD  buPROPion (WELLBUTRIN XL) 300 MG 24 hr tablet Take 1 tablet (300 mg total) by mouth daily. 09/13/15  Yes Unk Pinto, MD  Cholecalciferol (VITAMIN D PO) Take 1 tablet by mouth daily.   Yes Historical Provider, MD  furosemide (LASIX) 40 MG tablet Take 1 tablet (40 mg total) by mouth daily. 05/01/15 04/30/16 Yes Courtney Forcucci, PA-C  metoprolol succinate (TOPROL-XL) 100 MG 24 hr tablet TAKE 1/2 TO 1 TABLET DAILY AS DIRECTED FOR BP 07/15/15  Yes Unk Pinto, MD  montelukast (SINGULAIR) 10 MG tablet TAKE 1 TABLET BY MOUTH DAILY 09/18/14  Yes Vicie Mutters, PA-C  Omega-3 Fatty Acids (FISH OIL PO) Take 1 capsule by mouth daily.   Yes Historical Provider, MD  pregabalin (LYRICA) 100 MG capsule Take 1 capsule (100 mg total) by mouth 3 (three) times daily. Take for hot flashes. 09/13/15  Yes Unk Pinto, MD  Red Yeast Rice Extract (RED YEAST RICE PO) Take 1 tablet by mouth daily.   Yes Historical Provider, MD  valACYclovir (VALTREX) 500 MG tablet Take 1 tablet (500 mg total) by mouth 2 (two) times daily. Patient taking differently: Take 500 mg by mouth 2 (two) times daily as needed (AS NEEDED FOR BREAKOUTS).  01/09/15  Yes Courtney Forcucci, PA-C  valsartan (DIOVAN) 320 MG tablet Take 1 tablet (320 mg total) by mouth daily. 05/09/15  Yes Thayer Headings, MD  sertraline (ZOLOFT) 50 MG tablet TAKE 1 TABLET (50 MG TOTAL) BY MOUTH DAILY. 07/22/15   Unk Pinto, MD    Family History Family History  Problem Relation Age of Onset  . Diabetes Mother   . Heart disease Mother   . Hypertension Mother   . Heart attack Mother 95  . Other Father     MVA  . Asthma Sister   . Asthma Son   . Cancer Maternal Grandmother     Uterine  . Heart attack Paternal Grandmother   . Colon cancer Neg Hx     Social History Social History  Substance Use Topics  . Smoking status: Never Smoker  . Smokeless tobacco: Never Used  . Alcohol use Yes     Comment: 1-2 glasses wine nightly     Allergies   Review of patient's allergies indicates no known allergies.   Review of Systems Review of Systems  Constitutional:  Negative for appetite change, chills, diaphoresis, fatigue and fever.  HENT: Negative for mouth sores, sore throat and trouble swallowing.   Eyes: Negative for visual disturbance.  Respiratory: Negative for cough, chest tightness, shortness of breath and wheezing.   Cardiovascular: Negative for chest pain.  Gastrointestinal: Negative for abdominal distention, abdominal pain, diarrhea, nausea and vomiting.  Endocrine: Negative for polydipsia, polyphagia and polyuria.  Genitourinary: Negative for dysuria, frequency and hematuria.  Musculoskeletal: Negative for gait problem.  Skin: Negative for color change, pallor and rash.  Neurological: Positive for numbness and headaches.  Negative for dizziness, syncope and light-headedness.       Speech difficult  Hematological: Does not bruise/bleed easily.  Psychiatric/Behavioral: Negative for behavioral problems and confusion.     Physical Exam Updated Vital Signs BP 148/69   Pulse (!) 49   Temp 98 F (36.7 C)   Resp 15   SpO2 99%   Physical Exam  Constitutional: She is oriented to person, place, and time. She appears well-developed and well-nourished. No distress.  HENT:  Head: Normocephalic.  Eyes: Conjunctivae are normal. Pupils are equal, round, and reactive to light. No scleral icterus.  Neck: Normal range of motion. Neck supple. No thyromegaly present.  Supple neck. No pain with movement. No meningismus.  Cardiovascular: Normal rate and regular rhythm.  Exam reveals no gallop and no friction rub.   No murmur heard. Pulmonary/Chest: Effort normal and breath sounds normal. No respiratory distress. She has no wheezes. She has no rales.  Abdominal: Soft. Bowel sounds are normal. She exhibits no distension. There is no tenderness. There is no rebound.  Musculoskeletal: Normal range of motion.  Neurological: She is alert and oriented to person, place, and time.  Fluent speech. Shades her eyes from the light. No pronator drift. No leg drift   Skin: Skin is warm and dry. No rash noted.  Psychiatric: She has a normal mood and affect. Her behavior is normal.     ED Treatments / Results  Labs (all labs ordered are listed, but only abnormal results are displayed) Labs Reviewed  BASIC METABOLIC PANEL - Abnormal; Notable for the following:       Result Value   Potassium 3.2 (*)    Glucose, Bld 159 (*)    All other components within normal limits  CBC WITH DIFFERENTIAL/PLATELET  SEDIMENTATION RATE    EKG  EKG Interpretation None       Radiology Ct Head Wo Contrast  Result Date: 09/18/2015 CLINICAL DATA:  Headache, dizziness, difficulty speaking and focusing on tasks for 1 day, history asthma, cardiomyopathy, TIA, hypertension, chronic systolic CHF EXAM: CT HEAD WITHOUT CONTRAST TECHNIQUE: Contiguous axial images were obtained from the base of the skull through the vertex without intravenous contrast. COMPARISON:  08/19/2014 FINDINGS: Normal ventricular morphology. No midline shift or mass effect. Normal appearance of brain parenchyma. No intracranial hemorrhage, mass lesion or evidence acute infarction. No extra-axial fluid collections. Visualized paranasal sinuses mastoid air cells clear. Bones unremarkable. IMPRESSION: No acute intracranial abnormalities. Electronically Signed   By: Lavonia Dana M.D.   On: 09/18/2015 19:01    Procedures Procedures (including critical care time)  Medications Ordered in ED Medications  ondansetron (ZOFRAN) injection 4 mg (not administered)  morphine 4 MG/ML injection 4 mg (not administered)     Initial Impression / Assessment and Plan / ED Course  I have reviewed the triage vital signs and the nursing notes.  Pertinent labs & imaging results that were available during my care of the patient were reviewed by me and considered in my medical decision making (see chart for details).  Clinical Course    CT normal.  Given morphine and Zofran.  Headache is improved. Discussion: Patient  does not have thunderclap event. No meningismus. I'm not suspicious that this represents intracranial hemorrhage. She has a history of migraines over 30 years ago. Migraine variant/hemiparetic migraines possibility. Discussed with Dr. Nicole Kindred of neurology. He recommends MRI tonight. Patient being transferred to [facility]. Discussed with my partner Dr. Venora Maples  Final Clinical Impressions(s) / ED Diagnoses   Final  diagnoses:  Acute nonintractable headache, unspecified headache type    New Prescriptions New Prescriptions   No medications on file     [name], MD 09/18/15 2026

## 2015-09-23 ENCOUNTER — Ambulatory Visit (INDEPENDENT_AMBULATORY_CARE_PROVIDER_SITE_OTHER): Payer: BLUE CROSS/BLUE SHIELD | Admitting: Neurology

## 2015-09-23 ENCOUNTER — Encounter: Payer: Self-pay | Admitting: Neurology

## 2015-09-23 VITALS — BP 142/74 | HR 59 | Temp 97.8°F | Ht 61.25 in | Wt 145.0 lb

## 2015-09-23 DIAGNOSIS — R479 Unspecified speech disturbances: Secondary | ICD-10-CM

## 2015-09-23 DIAGNOSIS — R4189 Other symptoms and signs involving cognitive functions and awareness: Secondary | ICD-10-CM

## 2015-09-23 DIAGNOSIS — G43009 Migraine without aura, not intractable, without status migrainosus: Secondary | ICD-10-CM | POA: Diagnosis not present

## 2015-09-23 NOTE — Progress Notes (Signed)
NEUROLOGY CONSULTATION NOTE  Gail Bailey MRN: [id number] DOB: Aug 27, 1960  Referring provider: Dr. Unk Pinto Primary care provider: Dr. Unk Pinto  Reason for consult:  migraines  Dear Dr Melford Aase:  Thank you for your kind referral of Gail Bailey for consultation of the above symptoms. Although her history is well known to you, please allow me to reiterate it for the purpose of our medical record. She is accompanied by her sister who helps supplement the history today. Records and images were personally reviewed where available.  HISTORY OF PRESENT ILLNESS: This is a pleasant 55 year old right-handed woman with a history of hypertension, hyperlipidemia, diabetes, cardiomyopathy, presenting for cognitive changes and word-finding difficulties. She reports symptoms started after she was diagnosed with a TIA in August 2016. Records from her hospitalization were reviewed, she began to have chest pain, worse when bending down, and in the car en route to the hospital, had difficulty speaking, not making sense per sister. She had a "look" on her face. She had also reported tingling in the right arm and leg. Speech deficits improved fairly quickly but she continued to report paresthesias. MRI brain was unremarkable, no acute changes. Symptoms concerning for TIA. TIA workup showed severe hypokinesis of the apical anteroseptal and apical myocardium, EF 40-45%. She was discharged home on aspirin. Verapamil was switched to a beta-blocker. Since then, they noticed more and more difficulties getting her thoughts out. She used to do "15 things at the same time," now she has a hard time dealing with one thing. 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 She has difficulties keeping her train of thought, losing what she would say halfway through a conversation. They were sitting on the couch last night and got distracted, then forgot what she was saying when her husband was prompting her.  On 09/18/15 she started having a headache and noticed worse word-finding difficulties. Family reported the left side of her face was swollen. On the way to the ER, she was trying to say "social security" to her husband but had a hard time saying it, "like stammering." She felt like her vision was skewed, this has resolved. I personally reviewed MRI brain without contrast done in the ER, which did not show any acute changes. There was minimal chronic microvascular disease.   Her sister denies any staring/unresponsive episodes, she denies any olfactory/gustatory hallucinations, focal numbness/tingling/weakness, rising epigastric sensation, myoclonic jerks. She has headaches around 2-3 times a week, with throbbing pain lasting for 24 hours. She takes Aleve or ibuprofen 800mg , and lies down and sleeps. There is some photo and phonophobia, no nausea/vomiting. She has cognitive changes even without the headaches. She denies any dizziness, dysarthria/dysphagia, bowel/bladder dysfunction. She has occasional neck pain. She reports "off the charts stress," she was involved in a bad divorce in 09-May-2008, her mother passed away in 2012-05-09, then she got married last year and had a fall and the TIA. She reports that something else comes up with stress all the time. Sleep is not good, she has been getting 1 or 2 hours of sleep due to left hip pain and right leg pain. Her mother had seizures in her 61s, her sister and niece have seizures as well. She had a normal birth and early development, no history of febrile seizures, CNS infections, significant head injuries, or neurosurgical procedures.    PAST MEDICAL HISTORY: Past Medical History:  Diagnosis Date  . Allergy   . Asthma   . Cardiomyopathy ([place])  a. Echo 8/16:  EF 40-45%, apical and ant-septal HK, Gr 2 DD  . Diverticula, colon   . History of cardiovascular stress test    Myoview 8/16:  EF 48%, anterior and apical defect c/w breast atten, no ischemia; Low Risk  .  Hypertension   . Irregular periods/menstrual cycles   . LBBB (left bundle branch block)   . Seasonal allergies   . TIA (transient ischemic attack)   . Torn meniscus     PAST SURGICAL HISTORY: Past Surgical History:  Procedure Laterality Date  . BREAST REDUCTION SURGERY    . CARDIAC CATHETERIZATION    . CESAREAN SECTION     x2  . KNEE SURGERY     partial knee replacement right  . REPAIR QUADRICEPS / HAMSTRING MUSCLE    . TONSILLECTOMY    . tubes in ears  10/12    MEDICATIONS: Current Outpatient Prescriptions on File Prior to Visit  Medication Sig Dispense Refill  . acyclovir ointment (ZOVIRAX) 5 % Apply 1 application topically daily as needed (breakouts). 30 g 0  . Adapalene-Benzoyl Peroxide (EPIDUO) 0.1-2.5 % gel Apply 1 application topically as needed (acne). 45 g 0  . albuterol (VENTOLIN HFA) 108 (90 Base) MCG/ACT inhaler TAKE 2 PUFFS EVERY 4 HOURS DAILY AS NEEDED 18 Inhaler 99  . aspirin EC 81 MG tablet Take 1 tablet (81 mg total) by mouth daily.    Marland Kitchen atorvastatin (LIPITOR) 40 MG tablet TAKE 1/2 TO 1 TABLET DAILY OR AS DIRECTED FOR CHOLESTEROL 90 tablet 1  . atorvastatin (LIPITOR) 40 MG tablet Take 40 mg by mouth 3 (three) times a week. On Tuesday, Thursday and Saturday    . buPROPion (WELLBUTRIN XL) 300 MG 24 hr tablet Take 1 tablet (300 mg total) by mouth daily. 90 tablet 1  . Cholecalciferol (VITAMIN D PO) Take 1 tablet by mouth daily.    . furosemide (LASIX) 40 MG tablet Take 1 tablet (40 mg total) by mouth daily. 30 tablet 11  . metoprolol succinate (TOPROL-XL) 100 MG 24 hr tablet TAKE 1/2 TO 1 TABLET DAILY AS DIRECTED FOR BP 90 tablet 1  . metoprolol succinate (TOPROL-XL) 100 MG 24 hr tablet Take 50 mg by mouth daily. Take with or immediately following a meal.    . montelukast (SINGULAIR) 10 MG tablet TAKE 1 TABLET BY MOUTH DAILY 30 tablet 5  . Omega-3 Fatty Acids (FISH OIL PO) Take 1 capsule by mouth daily.    . pregabalin (LYRICA) 100 MG capsule Take 1 capsule (100 mg  total) by mouth 3 (three) times daily. Take for hot flashes. 270 capsule 1  . Red Yeast Rice Extract (RED YEAST RICE PO) Take 1 tablet by mouth daily.    . sertraline (ZOLOFT) 50 MG tablet TAKE 1 TABLET (50 MG TOTAL) BY MOUTH DAILY. 90 tablet 1  . valACYclovir (VALTREX) 500 MG tablet Take 1 tablet (500 mg total) by mouth 2 (two) times daily. 90 tablet 0  . valsartan (DIOVAN) 320 MG tablet Take 1 tablet (320 mg total) by mouth daily. 30 tablet 11   No current facility-administered medications on file prior to visit.     ALLERGIES: No Known Allergies  FAMILY HISTORY: Family History  Problem Relation Age of Onset  . Diabetes Mother   . Heart disease Mother   . Hypertension Mother   . Heart attack Mother 32  . Other Father     MVA  . Asthma Sister   . Asthma Son   . Cancer  Maternal Grandmother     Uterine  . Heart attack Paternal Grandmother   . Colon cancer Neg Hx     SOCIAL HISTORY: Social History   Social History  . Marital status: Divorced    Spouse name: N/A  . Number of children: 2  . Years of education: N/A   Occupational History  . [employer]   Social History Main Topics  . Smoking status: Never Smoker  . Smokeless tobacco: Never Used  . Alcohol use Yes     Comment: 1-2 glasses wine nightly  . Drug use: No  . Sexual activity: Not on file   Other Topics Concern  . Not on file   Social History Narrative  . No narrative on file    REVIEW OF SYSTEMS: Constitutional: No fevers, chills, or sweats, no generalized fatigue, change in appetite Eyes: No visual changes, double vision, eye pain Ear, nose and throat: No hearing loss, ear pain, nasal congestion, sore throat Cardiovascular: No chest pain, palpitations Respiratory:  No shortness of breath at rest or with exertion, wheezes GastrointestinaI: No nausea, vomiting, diarrhea, abdominal pain, fecal incontinence Genitourinary:  No dysuria, urinary retention or  frequency Musculoskeletal:  + neck pain, back pain Integumentary: No rash, pruritus, skin lesions Neurological: as above Psychiatric: No depression, insomnia, anxiety Endocrine: No palpitations, fatigue, diaphoresis, mood swings, change in appetite, change in weight, increased thirst Hematologic/Lymphatic:  No anemia, purpura, petechiae. Allergic/Immunologic: no itchy/runny eyes, nasal congestion, recent allergic reactions, rashes  PHYSICAL EXAM: Vitals:   09/23/15 0953  BP: (!) 142/74  Pulse: (!) 59  Temp: 97.8 F (36.6 C)   General: No acute distress Head:  Normocephalic/atraumatic Eyes: Fundoscopic exam shows bilateral sharp discs, no vessel changes, exudates, or hemorrhages Neck: supple, no paraspinal tenderness, full range of motion Back: No paraspinal tenderness Heart: regular rate and rhythm Lungs: Clear to auscultation bilaterally. Vascular: No carotid bruits. Skin/Extremities: No rash, no edema Neurological Exam: Mental status: alert and oriented to person, place, and time, no dysarthria or aphasia, Fund of knowledge is appropriate.  Recent and remote memory are intact.  Attention and concentration are normal.    Able to name objects and repeat phrases. Cranial nerves: CN I: not tested CN II: pupils equal, round and reactive to light, visual fields intact, fundi unremarkable. CN III, IV, VI:  full range of motion, no nystagmus, no ptosis CN V: decreased pin on left V1-3, split with tuning fork to left, did not split at midline with pin CN VII: upper and lower face symmetric CN VIII: hearing intact to finger rub CN IX, X: gag intact, uvula midline CN XI: sternocleidomastoid and trapezius muscles intact CN XII: tongue midline Bulk & Tone: normal, no fasciculations. Motor: 5/5 throughout with no pronator drift. Sensation: decreased pin on left UE, otherwise intact to light touch, cold, vibration and joint position sense.  No extinction to double simultaneous stimulation.   Romberg test negative Deep Tendon Reflexes: +2 throughout, no ankle clonus Plantar responses: downgoing bilaterally Cerebellar: no incoordination on finger to nose, heel to shin. No dysdiadochokinesia Gait: narrow-based and steady, able to tandem walk adequately. Tremor: none  IMPRESSION: This is a 55 year old right-handed woman with a history of hypertension, hyperlipidemia, diabetes, cardiomyopathy, presenting for cognitive changes and word-finding difficulties that have progressively worsened since August 2016 when she was diagnosed with a TIA after presenting for chest pain and speech difficulties. MRI brain in 2016 and 09/18/15 were normal. Her sister reported she had a  different "look" when she initially had the speech difficulties. The etiology of her symptoms is unclear, considerations include complicated migraine, less likely seizures. A 1-hour sleep-deprived EEG will be ordered to assess for focal abnormalities that increase risk for recurrent seizures/subclinical seizures causing cognitive changes. She will be scheduled for Neuropsychological evaluation, we discussed that similar symptoms can also be seen from psychological causes/stress, she does endorse a significant amount of stress. She will keep a calendar of her headaches and follow-up after the tests.   Thank you for allowing me to participate in the care of this patient. Please do not hesitate to call for any questions or concerns.   Ellouise Newer, M.D.  CC: Dr. Melford Aase

## 2015-09-23 NOTE — Patient Instructions (Signed)
1. Schedule 1-hour sleep-deprived EEG 2. Schedule Neurocognitive testing with Dr. Si Raider 3. Keep a calendar of your headaches 4. Follow-up in 2 months

## 2015-10-07 ENCOUNTER — Ambulatory Visit (INDEPENDENT_AMBULATORY_CARE_PROVIDER_SITE_OTHER): Payer: BLUE CROSS/BLUE SHIELD | Admitting: Neurology

## 2015-10-07 DIAGNOSIS — R4189 Other symptoms and signs involving cognitive functions and awareness: Secondary | ICD-10-CM | POA: Diagnosis not present

## 2015-10-07 DIAGNOSIS — R479 Unspecified speech disturbances: Secondary | ICD-10-CM

## 2015-10-07 DIAGNOSIS — G43009 Migraine without aura, not intractable, without status migrainosus: Secondary | ICD-10-CM

## 2015-10-10 ENCOUNTER — Ambulatory Visit (INDEPENDENT_AMBULATORY_CARE_PROVIDER_SITE_OTHER): Payer: BLUE CROSS/BLUE SHIELD | Admitting: Psychology

## 2015-10-10 ENCOUNTER — Encounter: Payer: Self-pay | Admitting: Psychology

## 2015-10-10 DIAGNOSIS — F4321 Adjustment disorder with depressed mood: Secondary | ICD-10-CM

## 2015-10-10 DIAGNOSIS — R4189 Other symptoms and signs involving cognitive functions and awareness: Secondary | ICD-10-CM

## 2015-10-10 NOTE — Progress Notes (Signed)
NEUROPSYCHOLOGICAL INTERVIEW (CPT: [id number])  Name: Gail Bailey Date of Birth: 05-Apr-1960 Date of Interview: 10/10/2015  Reason for Referral:  Gail Bailey is a 55 y.o., married, right-handed female who is referred for neuropsychological evaluation by Dr. Ellouise Newer of [facility] due to concerns about cognitive dysfunction. This patient is accompanied in the office by her husband and sister who supplement the history.  History of Presenting Problem:  Gail Bailey reported that she was in her usual state of health but under a lot of stress last August (2016) when she experienced acute onset of speech difficulties and was diagnosed with a TIA. She noted that there has been some question as to whether it was truly a TIA or a complex migraine. She saw Dr. Delice Lesch for neurologic consultation on 09/23/2015, and she completed an EEG this week (results pending). She does have a family history of seizures.  Since August 2016, the patient reported episodic speech difficulties with word finding problems and stammering speech. She reported "there have been two episodes where I've ended up at the hospital because everyone thought I was having a stroke." When the speech difficulties occur, she often observes a swelling of the left side of her face. The most recent episode occurred a few days ago while she was getting a Designer, [employer]. She felt very tired with a "terrible headache" and did not feel she could drive home. Her sister had to pick her up. She reported that the headache tends to start in the occipital area of her head and extend through her head to her eyes. Speech difficulties reportedly do not occur exclusively when she has a headache. Other cognitive difficulties which are present much of the time since August 2016 reportedly include: inability to manage multiple tasks at once (which used to be a strength), reduced efficiency in her work, increased errors, forgetting to complete tasks. Her husband noted  that on a vacation last weekend, the patient forgot to pack several important items which is very unlike her. Her sister noted that on one occasion recently, she asked the patient to bring a screwdriver to the office and she showed up with Windex.   Upon direct questioning, the patient Gail Bailey reported the following:   Forgetting recent conversations/events: Yes  Repeating statements/questions: Yes Misplacing/losing items: Yes Forgetting appointments or other obligations: Yes Forgetting to take medications: No; uses pillbox  Difficulty concentrating: Yes Starting but not finishing tasks: No Distracted easily: No Processing information more slowly: Yes, which is leading to reduced productivity (e.g., a task at work that she's been doing for 20 years and typically takes one day took her a week and a half recently)  Word-finding difficulty: Episodic Writing difficulty: No change Spelling difficulty: No change Comprehension difficulty: No. Just takes me longer to answer and get my thoughts out.   Getting lost when driving: No Making wrong turns when driving: No Uncertain about directions when driving or passenger: No   The patient continues to manage all complex ADLs.   The patient also reported significant fatigue over the past year, regardless of how much sleep she achieves. This is very uncharacteristic for her, as she has always been very high energy. She sleeps more at night and she takes naps during the day now. She does continue to try to get daily exercise, however. She reported that she gets at least 12,000 steps every day and usually closer to 15,000. She makes an effort to do this because of her high blood pressure.  The patient  admits she has been under a great deal of stress over the past several years. 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 Of note, she reported, "I'm someone who internalizes everything." She reported that her blood pressure "sky-rocketed" after her mother's death.   The patient reported reduced appetite recently but also weight gain (20 lbs).   Her family reported that she is more irritable and "snaps quicker" than she used to. Her husband noted that she gets much more emotional about things than she used to. The patient denied suicidal ideation or intention.   Psychiatric history:  The patient reported that she experienced depression after the loss of her mother in Apr 16, 2012. Her PCP prescribed Zoloft which she took for 2-3 years. She was having side effects so she was switched to Wellbutrin, which she took only for 2 weeks due to "severe side effects" of visual hallucinations and nightmares. She has never seen a mental health professional. She denied any history of suicidal ideation or intention.   Social History: Born/Raised: [place] (has lived in [place] for 35 years) Education: Dietitian (completed as an adult). She reported that she school was very difficult for her as a child/adolescent and that she had to work very hard to get average grades. 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 Has 2 children - 16yo daughter, 45 yo son - from her first marriage. Has 2 step children (26yo and 24yo) from her current husband. Alcohol/Tobacco/Substances: 1-2 glasses of wine 5x/week, no history of heavy drinking. No tobacco or substance use.  Medical History: Past Medical History:  Diagnosis Date  . Allergy   . Asthma   . Cardiomyopathy ([place])    a. Echo 8/16:  EF  40-45%, apical and ant-septal HK, Gr 2 DD  . Diverticula, colon   . History of cardiovascular stress test    Myoview 8/16:  EF 48%, anterior and apical defect c/w breast atten, no ischemia; Low Risk  . Hypertension   . Irregular periods/menstrual cycles   . LBBB (left bundle branch block)   . Seasonal allergies   . TIA (transient ischemic attack)   . Torn meniscus      Current Medications:  Outpatient Encounter Prescriptions as of 10/10/2015  Medication Sig  . acyclovir ointment (ZOVIRAX) 5 % Apply 1 application topically daily as needed (breakouts).  . Adapalene-Benzoyl Peroxide (EPIDUO) 0.1-2.5 % gel Apply 1 application topically as needed (acne).  Marland Kitchen albuterol (VENTOLIN HFA) 108 (90 Base) MCG/ACT inhaler TAKE 2 PUFFS EVERY 4 HOURS DAILY AS NEEDED  . aspirin EC 81 MG tablet Take 1 tablet (81 mg total) by mouth daily.  Marland Kitchen atorvastatin (LIPITOR) 40 MG tablet TAKE 1/2 TO 1 TABLET DAILY OR AS DIRECTED FOR CHOLESTEROL  . atorvastatin (LIPITOR) 40 MG tablet Take 40 mg by mouth 3 (three) times a week. On Tuesday, Thursday and Saturday  . buPROPion (WELLBUTRIN XL) 300 MG 24 hr tablet Take 1 tablet (300 mg total) by mouth daily.  . Cholecalciferol (VITAMIN D PO) Take 1  tablet by mouth daily.  . furosemide (LASIX) 40 MG tablet Take 1 tablet (40 mg total) by mouth daily.  . metoprolol succinate (TOPROL-XL) 100 MG 24 hr tablet TAKE 1/2 TO 1 TABLET DAILY AS DIRECTED FOR BP  . metoprolol succinate (TOPROL-XL) 100 MG 24 hr tablet Take 50 mg by mouth daily. Take with or immediately following a meal.  . montelukast (SINGULAIR) 10 MG tablet TAKE 1 TABLET BY MOUTH DAILY  . Omega-3 Fatty Acids (FISH OIL PO) Take 1 capsule by mouth daily.  . pregabalin (LYRICA) 100 MG capsule Take 1 capsule (100 mg total) by mouth 3 (three) times daily. Take for hot flashes.  . Red Yeast Rice Extract (RED YEAST RICE PO) Take 1 tablet by mouth daily.  . sertraline (ZOLOFT) 50 MG tablet TAKE 1 TABLET (50 MG TOTAL) BY MOUTH  DAILY.  . valACYclovir (VALTREX) 500 MG tablet Take 1 tablet (500 mg total) by mouth 2 (two) times daily.  . valsartan (DIOVAN) 320 MG tablet Take 1 tablet (320 mg total) by mouth daily.   No facility-administered encounter medications on file as of 10/10/2015.      Behavioral Observations:   Appearance: Neatly and appropriately dressed and groomed Gait: Ambulated independently, no abnormalities observed Speech: Fluent; normal rate, rhythm and volume Thought process: Linear, goal directed Affect: Full, stable, appropriate Interpersonal: Pleasant, appropriate, personable   TESTING: There is medical necessity to proceed with neuropsychological assessment as the results will be used to aid in differential diagnosis and clinical decision-making and to inform specific treatment recommendations. Per the patient, her family and medical records reviewed, there has been a change in cognitive functioning and a reasonable suspicion of cognitive disorder. There is also a need for objective testing of her subjective cognitive complaints in order to differentiate neurologic versus psychogenic etiology.   PLAN: The patient will return for a full battery of neuropsychological testing with a psychometrician under my supervision. Education regarding testing procedures was provided. Subsequently, the patient will see this provider for a follow-up session at which time her test performances and my impressions and treatment recommendations will be reviewed in detail.   Full neuropsychological evaluation report to follow.

## 2015-10-12 NOTE — Procedures (Signed)
ELECTROENCEPHALOGRAM REPORT  Date of Study: 10/07/2015  Patient's Name: Gail Bailey MRN: [id number] Date of Birth: [birth year]  Referring Provider: Dr. Ellouise Newer  Clinical History: This is a 55 year old woman with cognitive changes and speech difficulties, EEG to assess for subclinical seizures.  Medications: ZOVIRAX) VENTOLIN HFA aspirin EC 81 MG tablet   LIPITOR WELLBUTRIN XL VITAMIN D PO  LASIX TOPROL-XL SINGULAIR FISH OIL PO LYRICA RED YEAST RICE PO   ZOLOF VALTREX DIOVAN     Technical Summary: A multichannel digital 1-hour sleep-deprived EEG recording measured by the international 10-20 system with electrodes applied with paste and impedances below 5000 ohms performed in our laboratory with EKG monitoring in an awake and asleep patient.  Hyperventilation and photic stimulation were performed.  The digital EEG was referentially recorded, reformatted, and digitally filtered in a variety of bipolar and referential montages for optimal display.    Description: The patient is awake and asleep during the recording.  During maximal wakefulness, there is a symmetric, medium voltage 9 Hz posterior dominant rhythm that attenuates with eye opening.  The record is symmetric.  During drowsiness and sleep, there is an increase in theta and delta slowing of the background, with bursts of frontal delta activity seen in drowsiness and sleep. Occasional vertex waves were seen. Hyperventilation and photic stimulation did not elicit any abnormalities.  There were no epileptiform discharges or electrographic seizures seen.    EKG lead showed sinus bradycardia.  Impression: This 1-hour awake and asleep EEG is within normal limits.  Clinical Correlation: A normal EEG does not exclude a clinical diagnosis of epilepsy.  If further clinical questions remain, prolonged EEG may be helpful.  Clinical correlation is advised.   Ellouise Newer, M.D.

## 2015-10-17 ENCOUNTER — Other Ambulatory Visit: Payer: Self-pay | Admitting: Internal Medicine

## 2015-10-17 ENCOUNTER — Telehealth: Payer: Self-pay | Admitting: *Deleted

## 2015-10-17 NOTE — Telephone Encounter (Signed)
Patient called and states she could not tolerate the Wellbutrin due to nightmares and hallucinatons.  Per Dr Melford Aase, the patient has neuropsychologic testing results pending and suggested she wait until the results to decide what to do about her medications.

## 2015-10-18 ENCOUNTER — Ambulatory Visit (INDEPENDENT_AMBULATORY_CARE_PROVIDER_SITE_OTHER): Payer: BLUE CROSS/BLUE SHIELD | Admitting: Cardiovascular Disease

## 2015-10-18 ENCOUNTER — Encounter: Payer: Self-pay | Admitting: Cardiovascular Disease

## 2015-10-18 VITALS — BP 120/84 | HR 58 | Ht 61.25 in | Wt 147.8 lb

## 2015-10-18 DIAGNOSIS — I10 Essential (primary) hypertension: Secondary | ICD-10-CM

## 2015-10-18 NOTE — Progress Notes (Signed)
Gail Bailey Date of Birth  05/11/1960       [facility]    [employer] [address], [address], [place], [place]  [address]   [place], [place]  [address] [phone]     [phone]   Fax  [phone]    Fax [phone]  Problem List: 1. HTN  History of Present Illness:  Gail Bailey is a 55 yo who I have known for years.  She has been under lots of stress recently.  She has been very fatigued.   She is sleeping well.  She's been on Verapamil  for years for headaches. She recently had lisinopril added to her medical regimen. The dose was increased recently up to 40 mg a day.  She may be eating a bit more salty foods recently.  She has cut out her soft drinks.    April 08, 2012: Gail Bailey is doing OK but is fatigued after starting the HCTZ and potassium.   July 05, 2012:  Gail Bailey is doing well.  We stopped the HCTZ and K due to fatigue - her fatigue is much better.  Her BP at home has been well controlled.  She continues to watch her salt and is exercising daily.    Dec. 2, 2014:  Gail Bailey presents today for worsening CP.  She is not walking as much as she used to .  Typical BP is [id number] systolic .  She is on prednisone for sinus infection.  She is not sleeping well. This past Sunday, she had some numbness in her left hand.    February 08, 2015:  Gail Bailey is seen back  For follow-up visit. She is re-married. She had a TIA last fall ( vs. Migraine variant )  [name] and had to have it reattached ( Oct. 5, 2016)  ( she fell / slipped on wet floor)   BP has been elevated  - has been started on some new meds.  No CP   Is in rehab for her detached hamstring . Is gradually getting getter  Has gained some weight .   May 09, 2015: Doing well Exercising regularly . Still getting PT for her torn hamstring . Walks 10,000 steps a day  BP is elevated at home. Very normal here.   Oct. 6, 2017:  Has been having headaches Has  TIA symptoms at time - may be a complex  Seeing a neurologist.   Carotids are normal  Very busy at work ,   Exercises every day .     Current Outpatient Prescriptions on File Prior to Visit  Medication Sig Dispense Refill  . acyclovir ointment (ZOVIRAX) 5 % Apply 1 application topically daily as needed (breakouts). 30 g 0  . Adapalene-Benzoyl Peroxide (EPIDUO) 0.1-2.5 % gel Apply 1 application topically as needed (acne). 45 g 0  . albuterol (VENTOLIN HFA) 108 (90 Base) MCG/ACT inhaler TAKE 2 PUFFS EVERY 4 HOURS DAILY AS NEEDED 18 Inhaler 99  . aspirin EC 81 MG tablet Take 1 tablet (81 mg total) by mouth daily.    Marland Kitchen atorvastatin (LIPITOR) 40 MG tablet Take 40 mg by mouth 3 (three) times a week. On Tuesday, Thursday and Saturday    . Cholecalciferol (VITAMIN D PO) Take 1 tablet by mouth daily.    . furosemide (LASIX) 40 MG tablet Take 1 tablet (40 mg total) by mouth daily. 30 tablet 11  . metoprolol succinate (TOPROL-XL) 100 MG 24 hr  tablet Take 50 mg by mouth daily. Take with or immediately following a meal.    . montelukast (SINGULAIR) 10 MG tablet TAKE 1 TABLET BY MOUTH DAILY 30 tablet 5  . Omega-3 Fatty Acids (FISH OIL PO) Take 1 capsule by mouth daily.    . pregabalin (LYRICA) 100 MG capsule Take 1 capsule (100 mg total) by mouth 3 (three) times daily. Take for hot flashes. 270 capsule 1  . Red Yeast Rice Extract (RED YEAST RICE PO) Take 1 tablet by mouth daily.    . valACYclovir (VALTREX) 500 MG tablet Take 1 tablet (500 mg total) by mouth 2 (two) times daily. 90 tablet 0  . valsartan (DIOVAN) 320 MG tablet Take 1 tablet (320 mg total) by mouth daily. 30 tablet 11   No current facility-administered medications on file prior to visit.   HCTZ 12.5 mg a day.  Allergies  Allergen Reactions  . Maxitrol [Neomycin-Polymyxin-Dexameth] Other (See Comments)    Pharmacy called asking if we could document "allergy" to Maxitrol.  States patient "unsure of what her symptoms were" requesting we  simply document.    Past Medical History:  Diagnosis Date  . Allergy   . Asthma   . Cardiomyopathy (HCC)    a. Echo 8/16:  EF 40-45%, apical and ant-septal HK, Gr 2 DD  . Diverticula, colon   . History of cardiovascular stress test    Myoview 8/16:  EF 48%, anterior and apical defect c/w breast atten, no ischemia; Low Risk  . Hypertension   . Irregular periods/menstrual cycles   . LBBB (left bundle branch block)   . Seasonal allergies   . TIA (transient ischemic attack)   . Torn meniscus     Past Surgical History:  Procedure Laterality Date  . BREAST REDUCTION SURGERY    . CARDIAC CATHETERIZATION    . CESAREAN SECTION     x2  . KNEE SURGERY     partial knee replacement right  . REPAIR QUADRICEPS / HAMSTRING MUSCLE    . TONSILLECTOMY    . tubes in ears  10/12    History  Smoking Status  . Never Smoker  Smokeless Tobacco  . Never Used    History  Alcohol Use  . Yes    Comment: 1-2 glasses wine nightly    Family History  Problem Relation Age of Onset  . Diabetes Mother   . Heart disease Mother   . Hypertension Mother   . Heart attack Mother 82  . Other Father     MVA  . Asthma Sister   . Asthma Son   . Cancer Maternal Grandmother     Uterine  . Heart attack Paternal Grandmother   . Colon cancer Neg Hx     Reviw of Systems:  Reviewed in the HPI.  All other systems are negative.  Physical Exam: Blood pressure 120/84, pulse (!) 58, height 5' 1.25" (1.556 m), weight 147 lb 12.8 oz (67 kg). General: Well developed, well nourished, in no acute distress.  Head: Normocephalic, atraumatic, sclera non-icteric, mucus membranes are moist,  Neck: Supple. Carotids are 2 + without bruits. No JVD Lungs: Clear  Heart: RR, normal jS1, S2 Abdomen: Soft, non-tender, non-distended with normal bowel sounds.  I was able to palpitate her abdominal aorta.  Msk:  Strength and tone are normal  Extremities: No clubbing or cyanosis. No edema.  Distal pedal pulses are 2+ and  equal  Neuro: CN II - XII intact.  Alert and oriented X  3.  Psych:  Normal   ECG:  Assessment / Plan:   1. HTN -  Her blood pressure is well controlled here continue meds.    The myoview suggested a mildly reduced EF.   Cardiac MRI showed a normal EF of 59%.      Mertie Moores, MD  10/18/2015 11:51 AM    [address],  [place], [place]  [address] Pager [phone] Phone: [phone]; Fax: [phone]

## 2015-10-18 NOTE — Patient Instructions (Signed)
Medication Instructions:  Your physician recommends that you continue on your current medications as directed. Please refer to the Current Medication list given to you today.   Labwork: None Ordered   Testing/Procedures: None Ordered   Follow-Up: Your physician wants you to follow-up in: 6 months with Dr. Nahser.  You will receive a reminder letter in the mail two months in advance. If you don't receive a letter, please call our office to schedule the follow-up appointment.   If you need a refill on your cardiac medications before your next appointment, please call your pharmacy.   Thank you for choosing [facility]! Ruchy Wildrick, RN [phone]    

## 2015-10-24 ENCOUNTER — Ambulatory Visit (INDEPENDENT_AMBULATORY_CARE_PROVIDER_SITE_OTHER): Payer: BLUE CROSS/BLUE SHIELD | Admitting: Psychology

## 2015-10-24 DIAGNOSIS — R4189 Other symptoms and signs involving cognitive functions and awareness: Secondary | ICD-10-CM | POA: Diagnosis not present

## 2015-10-24 NOTE — Progress Notes (Signed)
   Neuropsychology Note  Gail Bailey returned today for 3 hours of neuropsychological testing with technician, Milana Kidney, BS, under the supervision of Dr. Macarthur Critchley. The patient did not appear overtly distressed by the testing session, per behavioral observation or via self-report to the technician. Rest breaks were offered. Gail Bailey will return within 2 weeks for a feedback session with Dr. Si Raider at which time her test performances, clinical impressions and treatment recommendations will be reviewed in detail. The patient understands she can contact our office should she require our assistance before this time.  Full report to follow.

## 2015-11-11 NOTE — Progress Notes (Signed)
NEUROPSYCHOLOGICAL EVALUATION   Name:    Gail Bailey  Date of Birth:   March 21, 1960 Date of Interview:  10/10/2015 Date of Testing:  10/24/2015  Date of Feedback:  11/12/2015     Background Information:  Reason for Referral:  Gail Bailey is a 55 y.o., right-handed female referred by Dr. Ellouise Newer to assess her current level of cognitive functioning and assist in differential diagnosis. The current evaluation consisted of a review of available medical records, an interview with the patient and her husband and her sister, and the completion of a neuropsychological testing battery. Informed consent was obtained.  History of Presenting Problem:  Gail Bailey reported that she was in her usual state of health but under a lot of stress last August (2016) when she experienced acute onset of speech difficulties and was diagnosed with a TIA. She noted that there has been some question as to whether it was truly a TIA or a complex migraine. She saw Dr. Delice Lesch for neurologic consultation on 09/23/2015, and she completed an EEG which was normal. She does have a family history of seizures. Brain MRI on 09/18/2015 was normal.  Since August 2016, the patient reported episodic speech difficulties with word finding problems and stammering speech. She reported "there have been two episodes where I've ended up at the hospital because everyone thought I was having a stroke." When the speech difficulties occur, she often observes a swelling of the left side of her face. The most recent episode occurred a few days ago while she was getting a Design[employer]. She felt very tired with a "terrible headache" and did not feel she could drive home. Her sister had to pick her up. She reported that the headache tends to start in the occipital area of her head and extend through her head to her eyes. Speech difficulties reportedly do not occur exclusively when she has a headache. Other cognitive difficulties which are present much  of the time since August 2016 reportedly include: inability to manage multiple tasks at once (which used to be a strength), reduced efficiency in her work, increased errors, forgetting to complete tasks. Her husband noted that on a vacation last weekend, the patient forgot to pack several important items which is very unlike her. Her sister noted that on one occasion recently, she asked the patient to bring a screwdriver to the office and she showed up with Windex.   Upon direct questioning, the patient Gail Bailey reported the following:   Forgetting recent conversations/events: Yes  Repeating statements/questions: Yes Misplacing/losing items: Yes Forgetting appointments or other obligations: Yes Forgetting to take medications: No; uses pillbox  Difficulty concentrating: Yes Starting but not finishing tasks: No Distracted easily: No Processing information more slowly: Yes, which is leading to reduced productivity (e.g., a task at work that she's been doing for 20 years and typically takes one day took her a week and a half recently)  Word-finding difficulty: Episodic Writing difficulty: No change Spelling difficulty: No change Comprehension difficulty: No. Just takes me longer to answer and get my thoughts out.   Getting lost when driving: No Making wrong turns when driving: No Uncertain about directions when driving or passenger: No   The patient continues to manage all complex ADLs.   The patient also reported significant fatigue over the past year, regardless of how much sleep she achieves. This is very uncharacteristic for her, as she has always been very high energy. She sleeps more at night and she takes naps during  the day now. She does continue to try to get daily exercise, however. She reported that she gets at least 12,000 steps every day and usually closer to 15,000. She makes an effort to do this because of her high blood pressure.  The patient admits she has been under  a great deal of stress over the past several years. 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 Of note, she reported, "I'm someone who internalizes everything." She reported that her blood pressure "sky-rocketed" after her mother's death.   The patient reported reduced appetite recently but also weight gain (20 lbs).   Her family reported that she is more irritable and "snaps quicker" than she used to. Her husband noted that she gets much more emotional about things than she used to. The patient denied suicidal ideation or intention.   Psychiatric history:  The patient reported that she experienced depression after the loss of her mother in 04-27-12. Her PCP prescribed Zoloft which she took for 2-3 years. She reported she was having side effects so she was switched to Wellbutrin, which she took only for 2 weeks due to "severe side effects" of visual hallucinations and nightmares. She has never seen a mental health professional. She denied any history of suicidal ideation or intention. At her feedback appointment, the patient reported that she recently restarted Zoloft due to increased anxiety.   Social History: Born/Raised: [place] (has lived in [place] for 35 years) Education: Dietitian (completed as an adult). She reported that school was very difficult for her as a child/adolescent and that she had to work very hard to get average grades. 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 Has 2 children - 2yo  daughter, 56 yo son - from her first marriage. Has 2 step children (26yo and 24yo) from her current husband. Alcohol/Tobacco/Substances: 1-2 glasses of wine 5x/week, no history of heavy drinking. No tobacco or substance use.  Medical History:  Past Medical History:  Diagnosis Date  . Allergy   . Asthma   . Cardiomyopathy ([place])    a. Echo 8/16:  EF 40-45%, apical and ant-septal HK, Gr 2 DD  . Diverticula, colon   . History of cardiovascular stress test    Myoview 8/16:  EF 48%, anterior and apical defect c/w breast atten, no ischemia; Low Risk  . Hypertension   . Irregular periods/menstrual cycles   . LBBB (left bundle branch block)   . Seasonal allergies   . TIA (transient ischemic attack)   . Torn meniscus     Current medications:  Outpatient Encounter Prescriptions as of 11/12/2015  Medication Sig  . acyclovir ointment (ZOVIRAX) 5 % Apply 1 application topically daily as needed (breakouts).  . Adapalene-Benzoyl Peroxide (EPIDUO) 0.1-2.5 % gel Apply 1 application topically as needed (acne).  Marland Kitchen albuterol (VENTOLIN HFA) 108 (90 Base) MCG/ACT inhaler TAKE 2 PUFFS EVERY 4 HOURS DAILY AS NEEDED  . aspirin EC 81 MG tablet Take 1 tablet (81 mg total) by mouth daily.  Marland Kitchen atorvastatin (LIPITOR) 40  MG tablet Take 40 mg by mouth 3 (three) times a week. On Tuesday, Thursday and Saturday  . Cholecalciferol (VITAMIN D PO) Take 1 tablet by mouth daily.  . furosemide (LASIX) 40 MG tablet Take 1 tablet (40 mg total) by mouth daily.  Marland Kitchen LYRICA 50 MG capsule Take 50 mg by mouth 3 (three) times daily. FOR HOT FLASHES  . metoprolol succinate (TOPROL-XL) 100 MG 24 hr tablet Take 50 mg by mouth daily. Take with or immediately following a meal.  . montelukast (SINGULAIR) 10 MG tablet TAKE 1 TABLET BY MOUTH DAILY  . Omega-3 Fatty Acids (FISH OIL PO) Take 1 capsule by mouth daily.  . pregabalin (LYRICA) 100 MG capsule Take 1 capsule (100 mg total) by mouth 3 (three) times daily. Take for hot flashes.  . Red  Yeast Rice Extract (RED YEAST RICE PO) Take 1 tablet by mouth daily.  . valACYclovir (VALTREX) 500 MG tablet Take 1 tablet (500 mg total) by mouth 2 (two) times daily.  . valsartan (DIOVAN) 320 MG tablet Take 1 tablet (320 mg total) by mouth daily.   No facility-administered encounter medications on file as of 11/12/2015.      Current Examination:  Behavioral Observations:  Appearance: Neatly and appropriately dressed and groomed Gait: Ambulated independently, no abnormalities observed Speech: Fluent; normal rate, rhythm and volume Thought process: Linear, goal directed Affect: Full, stable, appropriate Interpersonal: Pleasant, appropriate, personable Orientation: Oriented to all spheres. Accurately stated current President and his predecessor.  Tests Administered: . Test of Premorbid Functioning (TOPF) . Wechsler Adult Intelligence Scale-Fourth Edition (WAIS-IV): Similarities, Block Design, Matrix Reasoning, Arithmetic, Symbol Search, Coding and Digit Span subtests . Wechsler Memory Scale-Fourth Edition (WMS-IV) Adult Version (ages 73-69): Logical Memory I, II and Recognition subtests  . [place] Verbal Learning Test - 2nd Edition (CVLT-2) Standard Form . [employer] (WCST) . Repeatable Battery for the Assessment of Neuropsychological Status (RBANS) Form A:  Figure Copy and Recall Subtests . Neuropsychological Assessment Battery (NAB) Language Module, Form 1:  Naming Subtest . Controlled Oral Word Association Test (COWAT) . Trail Making Test A and B . Boston Diagnostic Aphasia Examination: Complex Ideational Material . Olevia Bowens Depression Inventory - Second edition (BDI-II) . Personality Assessment Inventory (PAI)  Test Results: Note: Standardized scores are presented only for use by appropriately trained professionals and to allow for any future test-retest comparison. These scores should not be interpreted without consideration of all the information that is  contained in the rest of the report. The most recent standardization samples from the test publisher or other sources were used whenever possible to derive standard scores; scores were corrected for age, gender, ethnicity and education when available.   Test Scores:  Test Name Standardized Score Descriptor  TOPF SS= 90 Average  WAIS-IV Subtests    Similarities ss= 12 High average  Block Design ss= 6 Low average  Matrix Reasoning ss= 7 Low average  Arithmetic ss= 9 Average  Symbol Search ss= 10 Average  Coding ss= 13 High average  Digit Span ss= 7 Low average  WMS-IV Subtests    LM I ss= 12 High average  LM II ss= 13 High average  LM II Recognition Cum %: >75 Above average  CVLT-II Scores    Trial 1 Z= -0.5 Average  Trial 5 Z= 0.5 Average  Trials 1-5 total T= 54 Average  SD Free Recall Z= 0 Average  SD Cued Recall Z= 0.5 Average  LD Free Recall Z= 0.5 Average  LD Cued  Recall Z= 0.5 Average  Recognition Discriminability (14/16 hits, 0 false positives) Z= 0.5 Average  Forced Choice Recognition Raw= 16/16 WNL  WCST    Total Errors T= 54 Average  Perseverative Responses T= 55 Average  Perseverative Errors T= 49 Average  Conceptual Level Responses T= 52 Average  Categories Completed 5; >16% WNL  Trials to Complete 1st Category 12; >16% WNL  Failure to Maintain Set 0 WNL  RBANS Subtests    Figure Copy Z= 0.6 Average  Figure Recall Z= 1.1 High average  NAB Naming Subtest T= 53 Average  COWAT-FAS T= 45 Average  COWAT-Animals T= 43 Average  Trail Making Test A 0 errors T= 61 High average  Trail Making Test B 0 errors T= 66 Superior  BDAE Complex Ideational Material Raw= 11/12   BDI-II 15/63 Mild  PAI No elevated clinical scales      Description of Test Results: The patient's performances on embedded validity indicators and a test of memory malingering were within normal limits. As such, the results of the current evaluation are judged to be an accurate representation of  her current level of cognitive functioning.   Premorbid verbal intellectual abilities were estimated to have been within the average range based on a test of word reading. Psychomotor processing speed was average to high average. Auditory attention and working memory were average to low average. Visual-spatial construction was low average to average. Language abilities were within normal limits. Specifically, confrontation naming was average (with 100% accuracy), and semantic verbal fluency was average. She did have mild difficulty with comprehension of complex ideational material (11/12 items correct). With regard to verbal memory, encoding and acquisition of non-contextual information (i.e., word list) was average. After an interference task, free recall was average. After a 20 minute delay, free recall was average. Cued recall was average. Performance on a yes/no recognition task was average. On another verbal memory test, encoding and acquisition of contextual auditory information (i.e., short story) was high average. After a delay, free recall was high average. Performance on a yes/no recognition task was above average. With regard to non-verbal memory, delayed free recall of visual information was high average. Executive functioning was within normal limits. Mental flexibility and set-shifting were superior on Trails B. Verbal fluency with phonemic search restrictions was average. Verbal abstract reasoning was high average. Non-verbal abstract reasoning was low average. Deductive reasoning and problem solving skills were average.    On a self-report questionnaire of mood (BDI-II), the patient's responses were  indicative of mild depression at the present time. Symptoms endorsed included: mild anhedonia, guilty feelings, loss of self-confidence, tearfulness, restlessness, loss of interest, loss of energy, reduced sleep, irritability, concentration difficulty and reduced libido. She endorsed more moderate  levels of self-criticalness and fatigue. She denied suicidal ideation or intention. On an extensive measure of psychopathology and personality functioning (PAI), validity indicators fell in the normal range, suggesting that the patient answered in a reasonably forthright manner and did not attempt to present an unrealistic or inaccurate impression that was either more negative or more positive than the clinical picture would warrant. Her PAI clinical profile reveals no marked elevations that should be considered to indicate the presence of clinical psychopathology.  Her score on the Somatic Complaints scale does show moderate elevations that may reflect sources of difficulty for the person.  Specifically, the patient indicates concerns about physical functioning and health matters in general.  According to the patient's self-report, she describes NO significant problems in the following areas:  unusual thoughts or peculiar experiences; antisocial behavior; problems with empathy; undue suspiciousness or hostility; extreme moodiness and impulsivity; unhappiness and depression; unusually elevated mood or heightened activity; marked anxiety; problematic behaviors used to manage anxiety.  Also, she reports NO significant problems with alcohol or drug abuse or dependence.  With respect to suicidal ideation, the patient is NOT reporting distress from thoughts of self-harm. The patient's interest in and motivation for treatment is somewhat below average in comparison to adults who are not being seen in a therapeutic setting.  Furthermore, her level of treatment motivation is substantially lower than is typical of individuals being seen in treatment settings.  Her responses suggest that she is satisfied with herself as she is, that she is not experiencing marked distress, and that, as a result, she sees little need for changes in her behavior.  However, the patient does report a number of strengths that augur well for a  relatively smooth treatment process if she made a commitment to treatment.   Clinical Impressions: Diagnosis deferred. Rule out major depressive disorder, mild. Results of cognitive testing were entirely within normal limits. There were no areas of impairment, and several scores fell in the high average to superior range. She did endorse some mild depression. Based on the available data, it is most likely that the patient's subjective cognitive complaints are secondary to psychosocial stress, fatigue and chronic pain. There is no evidence of an underlying dementia or cognitive disorder at this time.   Recommendations/Plan: Based on the findings of the present evaluation, the following recommendations are offered:  1. Counseling for stress management would likely be beneficial for this patient. She is amenable to this, and as such, I will happily refer her to [facility]. Additionally, I am glad that the patient has restarted Zoloft since this was effective for her in the past. I would recommend that she continue taking this medication. 2. The current test results will serve as a nice baseline for future comparison, if needed.    Feedback to Patient: Sharaya Pujols and her sister returned for a feedback appointment on 11/12/2015 to review the results of her neuropsychological evaluation with this provider. 20 minutes face-to-face time was spent reviewing her test results, my impressions and my recommendations as detailed above.    Total time spent on this patient's case: [id number] unit for interview with psychologist; [phone] units of testing by psychometrician under psychologist's supervision; [phone] units for medical record review, scoring of neuropsychological tests, interpretation of test results, preparation of this report, and review of results to the patient by psychologist.      Thank you for your referral of Aloha Knieriem. Please feel free to contact me if you have any  questions or concerns regarding this report.

## 2015-11-12 ENCOUNTER — Encounter: Payer: Self-pay | Admitting: Psychology

## 2015-11-12 ENCOUNTER — Ambulatory Visit (INDEPENDENT_AMBULATORY_CARE_PROVIDER_SITE_OTHER): Payer: BLUE CROSS/BLUE SHIELD | Admitting: Psychology

## 2015-11-12 DIAGNOSIS — F4321 Adjustment disorder with depressed mood: Secondary | ICD-10-CM | POA: Diagnosis not present

## 2015-11-12 DIAGNOSIS — R4189 Other symptoms and signs involving cognitive functions and awareness: Secondary | ICD-10-CM | POA: Diagnosis not present

## 2015-11-12 DIAGNOSIS — R479 Unspecified speech disturbances: Secondary | ICD-10-CM

## 2015-11-12 NOTE — Addendum Note (Signed)
Addended by: Glenford Bayley B on: 11/12/2015 03:21 PM   Modules accepted: Orders

## 2015-11-14 ENCOUNTER — Ambulatory Visit: Payer: BLUE CROSS/BLUE SHIELD | Admitting: Neurology

## 2015-11-18 ENCOUNTER — Other Ambulatory Visit: Payer: Self-pay | Admitting: Physician Assistant

## 2015-11-19 ENCOUNTER — Other Ambulatory Visit: Payer: Self-pay | Admitting: Physician Assistant

## 2015-11-19 ENCOUNTER — Telehealth: Payer: Self-pay

## 2015-11-19 MED ORDER — PHENTERMINE HCL 37.5 MG PO CAPS: 37.5000 mg | ORAL_CAPSULE | ORAL | 0 refills | Status: DC

## 2015-11-19 NOTE — Telephone Encounter (Signed)
Phentermine called into [facility] pharmacy

## 2015-11-25 ENCOUNTER — Ambulatory Visit: Payer: BLUE CROSS/BLUE SHIELD | Admitting: Gastroenterology

## 2015-11-26 ENCOUNTER — Other Ambulatory Visit: Payer: Self-pay | Admitting: Internal Medicine

## 2015-11-26 DIAGNOSIS — E6609 Other obesity due to excess calories: Secondary | ICD-10-CM

## 2015-11-26 MED ORDER — PHENTERMINE HCL 37.5 MG PO CAPS: 37.5000 mg | ORAL_CAPSULE | ORAL | 0 refills | Status: DC

## 2015-11-26 MED ORDER — PHENTERMINE HCL 37.5 MG PO TABS: 37.5000 mg | ORAL_TABLET | Freq: Every day | ORAL | 2 refills | Status: DC

## 2015-12-03 ENCOUNTER — Other Ambulatory Visit: Payer: Self-pay | Admitting: *Deleted

## 2015-12-03 MED ORDER — MONTELUKAST SODIUM 10 MG PO TABS: 10.0000 mg | ORAL_TABLET | Freq: Every day | ORAL | 5 refills | Status: DC

## 2015-12-09 ENCOUNTER — Other Ambulatory Visit: Payer: Self-pay | Admitting: Internal Medicine

## 2015-12-09 DIAGNOSIS — I10 Essential (primary) hypertension: Secondary | ICD-10-CM

## 2015-12-24 ENCOUNTER — Encounter: Payer: Self-pay | Admitting: Internal Medicine

## 2015-12-24 ENCOUNTER — Ambulatory Visit (INDEPENDENT_AMBULATORY_CARE_PROVIDER_SITE_OTHER): Payer: BLUE CROSS/BLUE SHIELD | Admitting: Internal Medicine

## 2015-12-24 VITALS — BP 138/84 | HR 58 | Temp 98.2°F | Resp 16 | Ht 61.25 in | Wt 146.0 lb

## 2015-12-24 DIAGNOSIS — E559 Vitamin D deficiency, unspecified: Secondary | ICD-10-CM | POA: Diagnosis not present

## 2015-12-24 DIAGNOSIS — I5022 Chronic systolic (congestive) heart failure: Secondary | ICD-10-CM

## 2015-12-24 DIAGNOSIS — I10 Essential (primary) hypertension: Secondary | ICD-10-CM | POA: Diagnosis not present

## 2015-12-24 DIAGNOSIS — R7303 Prediabetes: Secondary | ICD-10-CM

## 2015-12-24 DIAGNOSIS — Z79899 Other long term (current) drug therapy: Secondary | ICD-10-CM | POA: Diagnosis not present

## 2015-12-24 DIAGNOSIS — J069 Acute upper respiratory infection, unspecified: Secondary | ICD-10-CM

## 2015-12-24 DIAGNOSIS — E782 Mixed hyperlipidemia: Secondary | ICD-10-CM

## 2015-12-24 DIAGNOSIS — Z23 Encounter for immunization: Secondary | ICD-10-CM | POA: Diagnosis not present

## 2015-12-24 LAB — CBC WITH DIFFERENTIAL/PLATELET
BASOS ABS: 0 {cells}/uL (ref 0–200)
Basophils Relative: 0 %
EOS PCT: 1 %
Eosinophils Absolute: 89 cells/uL (ref 15–500)
HCT: 45.3 % — ABNORMAL HIGH (ref 35.0–45.0)
Hemoglobin: 14.8 g/dL (ref 11.7–15.5)
Lymphocytes Relative: 29 %
Lymphs Abs: 2581 cells/uL (ref 850–3900)
MCH: 29.5 pg (ref 27.0–33.0)
MCHC: 32.7 g/dL (ref 32.0–36.0)
MCV: 90.2 fL (ref 80.0–100.0)
MONOS PCT: 8 %
MPV: 12.5 fL (ref 7.5–12.5)
Monocytes Absolute: 712 cells/uL (ref 200–950)
NEUTROS ABS: 5518 {cells}/uL (ref 1500–7800)
Neutrophils Relative %: 62 %
PLATELETS: 315 10*3/uL (ref 140–400)
RBC: 5.02 MIL/uL (ref 3.80–5.10)
RDW: 14.2 % (ref 11.0–15.0)
WBC: 8.9 10*3/uL (ref 3.8–10.8)

## 2015-12-24 LAB — TSH: TSH: 1.06 mIU/L

## 2015-12-24 MED ORDER — PROMETHAZINE-DM 6.25-15 MG/5ML PO SYRP: ORAL_SOLUTION | ORAL | 1 refills | Status: DC

## 2015-12-24 MED ORDER — AZITHROMYCIN 250 MG PO TABS: ORAL_TABLET | ORAL | 0 refills | Status: DC

## 2015-12-24 MED ORDER — FLUTICASONE PROPIONATE 50 MCG/ACT NA SUSP: 2.0000 | Freq: Every day | NASAL | 0 refills | Status: DC

## 2015-12-24 MED ORDER — ALBUTEROL SULFATE HFA 108 (90 BASE) MCG/ACT IN AERS: INHALATION_SPRAY | RESPIRATORY_TRACT | 99 refills | Status: DC

## 2015-12-24 NOTE — Progress Notes (Signed)
Assessment and Plan:  Hypertension:  -Continue medication,  -monitor blood pressure at home.  -Continue DASH diet.   -Reminder to go to the ER if any CP, SOB, nausea, dizziness, severe HA, changes vision/speech, left arm numbness and tingling, and jaw pain.  Cholesterol: -Continue diet and exercise.  -Check cholesterol.   Pre-diabetes: -Continue diet and exercise.  -Check A1C  Vitamin D Def: -check level -continue medications.   Acute uri -cont prednisone -zpak -flonase -cont singulair -cont claritin -nasal saline -phenergan dm  Need for Flu shot -given today  CHF -no exacerbation symptoms -cont BBlocker -cont asa -cont arb   Continue diet and meds as discussed. Further disposition pending results of labs.  HPI 55 y.o. female  presents for 3 month follow up with hypertension, hyperlipidemia, prediabetes and vitamin D.   Her blood pressure has been controlled at home, today their BP is BP: 138/84.   She does not workout. She denies chest pain, shortness of breath, dizziness.   She is on cholesterol medication and denies myalgias. Her cholesterol is at goal. The cholesterol last visit was:   Lab Results  Component Value Date   CHOL 178 09/13/2015   HDL 40 (L) 09/13/2015   LDLCALC 96 09/13/2015   TRIG 208 (H) 09/13/2015   CHOLHDL 4.5 09/13/2015     She has been working on diet and exercise for prediabetes, and denies foot ulcerations, hyperglycemia, hypoglycemia , increased appetite, nausea, paresthesia of the feet, polydipsia, polyuria, visual disturbances, vomiting and weight loss. Last A1C in the office was:  Lab Results  Component Value Date   HGBA1C 5.7 (H) 09/13/2015    Patient is on Vitamin D supplement.  Lab Results  Component Value Date   VD25OH 81 09/13/2015     She reports that she has been having some congestion which started this weekend.  She started taking prednisone.  She notes that she is taking singulair and claritin nightly.  She is  not currently doing flonase.  She is not using current cough medications.  She did take some nyquil last night.   There have been no recent issues lately with PND, SOB, dry cough, or pedal edema.  She is careful of her sodium intake.    Current Medications:  Current Outpatient Prescriptions on File Prior to Visit  Medication Sig Dispense Refill  . acyclovir ointment (ZOVIRAX) 5 % Apply 1 application topically daily as needed (breakouts). 30 g 0  . Adapalene-Benzoyl Peroxide (EPIDUO) 0.1-2.5 % gel Apply 1 application topically as needed (acne). 45 g 0  . albuterol (VENTOLIN HFA) 108 (90 Base) MCG/ACT inhaler TAKE 2 PUFFS EVERY 4 HOURS DAILY AS NEEDED 18 Inhaler 99  . aspirin EC 81 MG tablet Take 1 tablet (81 mg total) by mouth daily.    Marland Kitchen atorvastatin (LIPITOR) 40 MG tablet Take 40 mg by mouth 3 (three) times a week. On Tuesday, Thursday and Saturday    . Cholecalciferol (VITAMIN D PO) Take 1 tablet by mouth daily.    . furosemide (LASIX) 40 MG tablet Take 1 tablet (40 mg total) by mouth daily. 30 tablet 11  . LYRICA 50 MG capsule Take 50 mg by mouth 3 (three) times daily. FOR HOT FLASHES  5  . metoprolol succinate (TOPROL-XL) 100 MG 24 hr tablet Take 50 mg by mouth daily. Take with or immediately following a meal.    . metoprolol succinate (TOPROL-XL) 100 MG 24 hr tablet TAKE 1/2 TO 1 TABLET DAILY AS DIRECTED FOR BP 90 tablet  1  . montelukast (SINGULAIR) 10 MG tablet Take 1 tablet (10 mg total) by mouth daily. 30 tablet 5  . Omega-3 Fatty Acids (FISH OIL PO) Take 1 capsule by mouth daily.    . phentermine (ADIPEX-P) 37.5 MG tablet Take 1 tablet (37.5 mg total) by mouth daily before breakfast. 30 tablet 2  . pregabalin (LYRICA) 100 MG capsule Take 1 capsule (100 mg total) by mouth 3 (three) times daily. Take for hot flashes. 270 capsule 1  . Red Yeast Rice Extract (RED YEAST RICE PO) Take 1 tablet by mouth daily.    . valACYclovir (VALTREX) 500 MG tablet Take 1 tablet (500 mg total) by mouth 2  (two) times daily. 90 tablet 0  . valsartan (DIOVAN) 320 MG tablet Take 1 tablet (320 mg total) by mouth daily. 30 tablet 11   No current facility-administered medications on file prior to visit.     Medical History:  Past Medical History:  Diagnosis Date  . Allergy   . Asthma   . Cardiomyopathy ([place])    a. Echo 8/16:  EF 40-45%, apical and ant-septal HK, Gr 2 DD  . Diverticula, colon   . History of cardiovascular stress test    Myoview 8/16:  EF 48%, anterior and apical defect c/w breast atten, no ischemia; Low Risk  . Hypertension   . Irregular periods/menstrual cycles   . LBBB (left bundle branch block)   . Seasonal allergies   . TIA (transient ischemic attack)   . Torn meniscus     Allergies:  Allergies  Allergen Reactions  . Maxitrol [Neomycin-Polymyxin-Dexameth] Other (See Comments)    Pharmacy called asking if we could document "allergy" to Maxitrol.  States patient "unsure of what her symptoms were" requesting we simply document.     Review of Systems:  Review of Systems  Constitutional: Negative for chills, fever and malaise/fatigue.  HENT: Negative for congestion, ear pain and sore throat.   Eyes: Negative.   Respiratory: Negative for cough, shortness of breath and wheezing.   Cardiovascular: Negative for chest pain, palpitations and leg swelling.  Gastrointestinal: Negative for abdominal pain, blood in stool, constipation, diarrhea, heartburn and melena.  Genitourinary: Negative.   Skin: Negative.   Neurological: Negative for dizziness, sensory change, loss of consciousness and headaches.  Psychiatric/Behavioral: Negative for depression. The patient is not nervous/anxious and does not have insomnia.     Family history- Review and unchanged  Social history- Review and unchanged  Physical Exam: BP 138/84   Pulse (!) 58   Temp 98.2 F (36.8 C) (Temporal)   Resp 16   Ht 5' 1.25" (1.556 m)   Wt 146 lb (66.2 kg)   BMI 27.36 kg/m  Wt Readings from Last 3  Encounters:  12/24/15 146 lb (66.2 kg)  10/18/15 147 lb 12.8 oz (67 kg)  09/23/15 145 lb (65.8 kg)    General Appearance: Well nourished well developed, in no apparent distress. Eyes: PERRLA, EOMs, conjunctiva no swelling or erythema ENT/Mouth: Ear canals normal without obstruction, swelling, erythma, discharge.  TMs normal bilaterally.  Oropharynx moist, clear, without exudate, or postoropharyngeal swelling. Neck: Supple, thyroid normal,no cervical adenopathy  Respiratory: Respiratory effort normal, Breath sounds clear A&P without rhonchi, wheeze, or rale.  No retractions, no accessory usage. Cardio: RRR with no MRGs. Brisk peripheral pulses without edema.  Abdomen: Soft, + BS,  Non tender, no guarding, rebound, hernias, masses. Musculoskeletal: Full ROM, 5/5 strength, Normal gait Skin: Warm, dry without rashes, lesions, ecchymosis.  Neuro: Awake  and oriented X 3, Cranial nerves intact. Normal muscle tone, no cerebellar symptoms. Psych: Normal affect, Insight and Judgment appropriate.    Starlyn Skeans, PA-C 9:56 AM [facility]

## 2015-12-25 LAB — HEPATIC FUNCTION PANEL
ALT: 24 U/L (ref 6–29)
AST: 18 U/L (ref 10–35)
Albumin: 4.1 g/dL (ref 3.6–5.1)
Alkaline Phosphatase: 108 U/L (ref 33–130)
BILIRUBIN DIRECT: 0.1 mg/dL (ref <=0.2)
BILIRUBIN INDIRECT: 0.3 mg/dL (ref 0.2–1.2)
BILIRUBIN TOTAL: 0.4 mg/dL (ref 0.2–1.2)
Total Protein: 6.2 g/dL (ref 6.1–8.1)

## 2015-12-25 LAB — LIPID PANEL
CHOL/HDL RATIO: 4.6 ratio (ref <5.0)
CHOLESTEROL: 187 mg/dL (ref <200)
HDL: 41 mg/dL — ABNORMAL LOW (ref >50)
LDL Cholesterol: 110 mg/dL — ABNORMAL HIGH (ref <100)
TRIGLYCERIDES: 179 mg/dL — AB (ref <150)
VLDL: 36 mg/dL — AB (ref <30)

## 2015-12-25 LAB — BASIC METABOLIC PANEL WITH GFR
BUN: 13 mg/dL (ref 7–25)
CALCIUM: 8.8 mg/dL (ref 8.6–10.4)
CHLORIDE: 105 mmol/L (ref 98–110)
CO2: 29 mmol/L (ref 20–31)
CREATININE: 0.83 mg/dL (ref 0.50–1.05)
GFR, EST NON AFRICAN AMERICAN: 80 mL/min (ref >=60)
GFR, Est African American: >89 mL/min (ref >=60)
Glucose, Bld: 96 mg/dL (ref 65–99)
Potassium: 3.9 mmol/L (ref 3.5–5.3)
SODIUM: 141 mmol/L (ref 135–146)

## 2015-12-30 ENCOUNTER — Ambulatory Visit (INDEPENDENT_AMBULATORY_CARE_PROVIDER_SITE_OTHER): Payer: BLUE CROSS/BLUE SHIELD | Admitting: Neurology

## 2015-12-30 ENCOUNTER — Encounter: Payer: Self-pay | Admitting: Neurology

## 2015-12-30 VITALS — BP 124/72 | HR 67 | Ht 61.25 in | Wt 144.4 lb

## 2015-12-30 DIAGNOSIS — R4189 Other symptoms and signs involving cognitive functions and awareness: Secondary | ICD-10-CM | POA: Diagnosis not present

## 2015-12-30 DIAGNOSIS — F411 Generalized anxiety disorder: Secondary | ICD-10-CM

## 2015-12-30 MED ORDER — BUSPIRONE HCL 15 MG PO TABS: ORAL_TABLET | ORAL | 6 refills | Status: DC

## 2015-12-30 NOTE — Patient Instructions (Signed)
1. Start Buspar 15mg : Take 1 tablet at night for 1 week, then increase to 1 tablet twice a day 2. Refer to Behavioral Medicine for Psychiatry and Psychotherapy 3. Continue with stress management techniques 4. Follow-up in 6 months, call for any changes

## 2015-12-30 NOTE — Progress Notes (Signed)
NEUROLOGY FOLLOW UP OFFICE NOTE  Wincie Fiegl [id number]  HISTORY OF PRESENT ILLNESS: I had the pleasure of seeing Gail Bailey in follow-up in the neurology clinic on 12/30/2015.  The patient was last seen 3 months ago for cognitive and word-finding difficulties. She reports that symptoms started after she was diagnosed with a TIA in August 2016 where she was having difficulty speaking and not making sense. Since then, she started noticing continued cognitive problems. In September, she had a headache and worsening word-finding difficulties. MRI brain did not show any acute changes. Records and images were personally reviewed where available.  Her 1-hour EEG was normal. She had Neuropsychological testing done which was entirely within normal limits. There were no areas of impairment, and several scores fell in the high average to superior range. She did endorse some mild depression. Based on the available data, it is most likely that the patient's subjective cognitive complaints are secondary to psychosocial stress, fatigue and chronic pain. There is no evidence of an underlying dementia or cognitive disorder at this time.   Since her last visit, she has tried to unload more on her family and co-workers, trying to relieve some stress. She feels this has helped, but still feels she is "not at the top of my game sometimes." She still notices some trouble getting her words out. She had problems with Zoloft and Wellbutrin, but noticed she was shorter with her family and more anxious off the medication. She reports there have been no further similar symptoms where she had to go to the ER.   HPI 09/23/2015: This is a pleasant 55 yo RH woman with a history of hypertension, hyperlipidemia, diabetes, cardiomyopathy, who presented with cognitive changes and word-finding difficulties. She reports symptoms started after she was diagnosed with a TIA in August 2016. Records from her hospitalization were reviewed, she  began to have chest pain, worse when bending down, and in the car en route to the hospital, had difficulty speaking, not making sense per sister. She had a "look" on her face. She had also reported tingling in the right arm and leg. Speech deficits improved fairly quickly but she continued to report paresthesias. MRI brain was unremarkable, no acute changes. Symptoms concerning for TIA. TIA workup showed severe hypokinesis of the apical anteroseptal and apical myocardium, EF 40-45%. She was discharged home on aspirin. Verapamil was switched to a beta-blocker. Since then, they noticed more and more difficulties getting her thoughts out. She used to do "15 things at the same time," now she has a hard time dealing with one thing. 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 She has difficulties keeping her train of thought, losing what she would say halfway through a conversation. They were sitting on the couch last night and got distracted, then forgot what she was saying when her husband was prompting her. On 09/18/15 she started having a headache and noticed worse word-finding difficulties. Family reported the left side of her face was swollen. On the way to the ER, she was trying to say "social security" to her husband but had a hard time saying it, "like stammering." She felt like her vision was skewed, this has resolved. I personally reviewed MRI brain without contrast done in the ER, which did not show any acute changes. There was minimal chronic microvascular disease.    Her sister denies any staring/unresponsive episodes, she denies any olfactory/gustatory hallucinations, focal numbness/tingling/weakness, rising epigastric sensation, myoclonic jerks. She has headaches around  2-3 times a week, with throbbing pain lasting for 24 hours. She takes Aleve or ibuprofen 800mg , and lies down and sleeps. There is some photo and phonophobia, no nausea/vomiting. She has cognitive changes even without the  headaches. She denies any dizziness, dysarthria/dysphagia, bowel/bladder dysfunction. She has occasional neck pain. She reports "off the charts stress," she was involved in a bad divorce in 04-15-08, her mother passed away in 04/15/2012, then she got married last year and had a fall and the TIA. She reports that something else comes up with stress all the time. Sleep is not good, she has been getting 1 or 2 hours of sleep due to left hip pain and right leg pain. Her mother had seizures in her 11s, her sister and niece have seizures as well. She had a normal birth and early development, no history of febrile seizures, CNS infections, significant head injuries, or neurosurgical procedures.   PAST MEDICAL HISTORY: Past Medical History:  Diagnosis Date  . Allergy   . Asthma   . Cardiomyopathy ([place])    a. Echo 8/16:  EF 40-45%, apical and ant-septal HK, Gr 2 DD  . Diverticula, colon   . History of cardiovascular stress test    Myoview 8/16:  EF 48%, anterior and apical defect c/w breast atten, no ischemia; Low Risk  . Hypertension   . Irregular periods/menstrual cycles   . LBBB (left bundle branch block)   . Seasonal allergies   . TIA (transient ischemic attack)   . Torn meniscus     MEDICATIONS: Current Outpatient Prescriptions on File Prior to Visit  Medication Sig Dispense Refill  . acyclovir ointment (ZOVIRAX) 5 % Apply 1 application topically daily as needed (breakouts). 30 g 0  . Adapalene-Benzoyl Peroxide (EPIDUO) 0.1-2.5 % gel Apply 1 application topically as needed (acne). 45 g 0  . albuterol (VENTOLIN HFA) 108 (90 Base) MCG/ACT inhaler TAKE 2 PUFFS EVERY 4 HOURS DAILY AS NEEDED 18 Inhaler 99  . aspirin EC 81 MG tablet Take 1 tablet (81 mg total) by mouth daily.    Marland Kitchen atorvastatin (LIPITOR) 40 MG tablet Take 40 mg by mouth 3 (three) times a week. On Tuesday, Thursday and Saturday    . Cholecalciferol (VITAMIN D PO) Take 1 tablet by mouth daily.    . fluticasone (FLONASE) 50 MCG/ACT nasal spray  Place 2 sprays into both nostrils daily. 16 g 0  . furosemide (LASIX) 40 MG tablet Take 1 tablet (40 mg total) by mouth daily. 30 tablet 11  . LYRICA 50 MG capsule Take 50 mg by mouth 3 (three) times daily. FOR HOT FLASHES  5  . metoprolol succinate (TOPROL-XL) 100 MG 24 hr tablet Take 50 mg by mouth daily. Take with or immediately following a meal.    . metoprolol succinate (TOPROL-XL) 100 MG 24 hr tablet TAKE 1/2 TO 1 TABLET DAILY AS DIRECTED FOR BP 90 tablet 1  . montelukast (SINGULAIR) 10 MG tablet Take 1 tablet (10 mg total) by mouth daily. 30 tablet 5  . Omega-3 Fatty Acids (FISH OIL PO) Take 1 capsule by mouth daily.    . pregabalin (LYRICA) 100 MG capsule Take 1 capsule (100 mg total) by mouth 3 (three) times daily. Take for hot flashes. 270 capsule 1  . promethazine-dextromethorphan (PROMETHAZINE-DM) 6.25-15 MG/5ML syrup Take 5-10 ML PO q8hrs prn for cough and congestion 360 mL 1  . Red Yeast Rice Extract (RED YEAST RICE PO) Take 1 tablet by mouth daily.    . valACYclovir (  VALTREX) 500 MG tablet Take 1 tablet (500 mg total) by mouth 2 (two) times daily. 90 tablet 0  . valsartan (DIOVAN) 320 MG tablet Take 1 tablet (320 mg total) by mouth daily. 30 tablet 11  . phentermine (ADIPEX-P) 37.5 MG tablet Take 1 tablet (37.5 mg total) by mouth daily before breakfast. (Patient not taking: Reported on 12/30/2015) 30 tablet 2   No current facility-administered medications on file prior to visit.     ALLERGIES: Allergies  Allergen Reactions  . Maxitrol [Neomycin-Polymyxin-Dexameth] Other (See Comments)    Pharmacy called asking if we could document "allergy" to Maxitrol.  States patient "unsure of what her symptoms were" requesting we simply document.    FAMILY HISTORY: Family History  Problem Relation Age of Onset  . Diabetes Mother   . Heart disease Mother   . Hypertension Mother   . Heart attack Mother 65  . Other Father     MVA  . Asthma Sister   . Asthma Son   . Cancer Maternal  Grandmother     Uterine  . Heart attack Paternal Grandmother   . Colon cancer Neg Hx     SOCIAL HISTORY: Social History   Social History  . Marital status: Divorced    Spouse name: N/A  . Number of children: 2  . Years of education: N/A   Occupational History  . [employer]   Social History Main Topics  . Smoking status: Never Smoker  . Smokeless tobacco: Never Used  . Alcohol use Yes     Comment: 1-2 glasses wine nightly  . Drug use: No  . Sexual activity: Not on file   Other Topics Concern  . Not on file   Social History Narrative  . No narrative on file    REVIEW OF SYSTEMS: Constitutional: No fevers, chills, or sweats, + generalized fatigue, change in appetite Eyes: No visual changes, double vision, eye pain Ear, nose and throat: No hearing loss, ear pain, nasal congestion, sore throat Cardiovascular: No chest pain, palpitations Respiratory:  No shortness of breath at rest or with exertion, wheezes GastrointestinaI: No nausea, vomiting, diarrhea, abdominal pain, fecal incontinence Genitourinary:  No dysuria, urinary retention or frequency Musculoskeletal:  No neck pain, back pain Integumentary: No rash, pruritus, skin lesions Neurological: as above Psychiatric: No depression, insomnia,+ anxiety Endocrine: No palpitations, fatigue, diaphoresis, mood swings, change in appetite, change in weight, increased thirst Hematologic/Lymphatic:  No anemia, purpura, petechiae. Allergic/Immunologic: no itchy/runny eyes, nasal congestion, recent allergic reactions, rashes  PHYSICAL EXAM: Vitals:   12/30/15 1546  BP: 124/72  Pulse: 67   General: No acute distress Head:  Normocephalic/atraumatic Neck: supple, no paraspinal tenderness, full range of motion Heart:  Regular rate and rhythm Lungs:  Clear to auscultation bilaterally Back: No paraspinal tenderness Skin/Extremities: No rash, no edema Neurological Exam: alert and oriented to person,  place, and time. No aphasia or dysarthria. Fund of knowledge is appropriate.  Recent and remote memory are intact.  Attention and concentration are normal.    Able to name objects and repeat phrases. Cranial nerves: Pupils equal, round, reactive to light. Extraocular movements intact with no nystagmus. Visual fields full. Facial sensation intact. No facial asymmetry. Tongue, uvula, palate midline.  Motor: Bulk and tone normal, muscle strength 5/5 throughout with no pronator drift.  Sensation to light touch intact.  No extinction to double simultaneous stimulation.  Deep tendon reflexes 2+ throughout, toes downgoing.  Finger to nose testing intact.  Gait narrow-based and  steady, able to tandem walk adequately.  Romberg negative.  IMPRESSION: This is a 55 yo RH woman with a history of hypertension, hyperlipidemia, diabetes, cardiomyopathy, who presented with cognitive changes and word-finding difficulties that have progressively worsened since August 2016 when she was diagnosed with a TIA after presenting for chest pain and speech difficulties. MRI brain in 2016 and 09/18/15 were normal. Her sister reported she had a different "look" when she initially had the speech difficulties. EEG normal. On her initial visit, we had discussed that similar symptoms can also be seen from psychological causes/stress, she does endorse a significant amount of stress. Neuropsychological evaluation was normal, we discussed findings and she agrees to seeing Behavioral Medicine for psychiatry and therapy. She had side effects on Wellbutrin and Zoloft, but felt more anxious and short with people off medication. She feels symptoms are mostly due to anxiety rather than depression, she will start Buspar 15mg  daily for 1 week, then increase to BID. Side effects were discussed. If further medication adjustments/changes need to be made, she will discuss this with her new psychiatrist. She will follow-up in 6 months and knows to call for any  changes.   Thank you for allowing me to participate in her care.  Please do not hesitate to call for any questions or concerns.  The duration of this appointment visit was 15 minutes of face-to-face time with the patient.  Greater than 50% of this time was spent in counseling, explanation of diagnosis, planning of further management, and coordination of care.   Ellouise Newer, M.D.   CC: Dr. Melford Aase

## 2016-01-19 ENCOUNTER — Other Ambulatory Visit: Payer: Self-pay | Admitting: Internal Medicine

## 2016-01-19 DIAGNOSIS — J069 Acute upper respiratory infection, unspecified: Secondary | ICD-10-CM

## 2016-01-28 ENCOUNTER — Ambulatory Visit (HOSPITAL_COMMUNITY): Payer: BLUE CROSS/BLUE SHIELD | Admitting: Licensed Clinical Social Worker

## 2016-02-04 ENCOUNTER — Ambulatory Visit (HOSPITAL_COMMUNITY): Payer: BLUE CROSS/BLUE SHIELD | Admitting: Licensed Clinical Social Worker

## 2016-03-13 ENCOUNTER — Other Ambulatory Visit: Payer: Self-pay | Admitting: Internal Medicine

## 2016-03-13 DIAGNOSIS — N951 Menopausal and female climacteric states: Secondary | ICD-10-CM

## 2016-03-13 MED ORDER — PREGABALIN 100 MG PO CAPS: ORAL_CAPSULE | ORAL | 1 refills | Status: DC

## 2016-03-13 NOTE — Telephone Encounter (Signed)
Please call Lyrica 

## 2016-04-09 ENCOUNTER — Ambulatory Visit: Payer: Self-pay | Admitting: Internal Medicine

## 2016-04-21 NOTE — Progress Notes (Signed)
RESCHEDULED

## 2016-04-22 ENCOUNTER — Ambulatory Visit: Payer: Self-pay | Admitting: Internal Medicine

## 2016-04-22 NOTE — Progress Notes (Signed)
This very nice 56 y.o. re-MWF presents for 3 month follow up with Hypertension, Hyperlipidemia, Pre-Diabetes and Vitamin D Deficiency.      Patient is treated for HTN (2014)  & BP has been controlled at home. Today's BP is at goal - 132/80. Patient has had no complaints of any cardiac type chest pain, palpitations, dyspnea/orthopnea/PND, dizziness, claudication, or dependent edema.     Hyperlipidemia is controlled with diet & meds. Patient denies myalgias or other med SE's. Last Lipids were not at goal: Lab Results  Component Value Date   CHOL 187 12/24/2015   HDL 41 (L) 12/24/2015   LDLCALC 110 (H) 12/24/2015   TRIG 179 (H) 12/24/2015   CHOLHDL 4.6 12/24/2015      Also, the patient has history of PreDiabetes (A1c 6.0% in 2012)  and has had no symptoms of reactive hypoglycemia, diabetic polys, paresthesias or visual blurring.  Last A1c was near goal: Lab Results  Component Value Date   HGBA1C 5.7 (H) 09/13/2015      Further, the patient also has history of Vitamin D Deficiency ("48" on treatment in 2008)  and supplements vitamin D without any suspected side-effects. Last vitamin D was still low (goal 70-100): Lab Results  Component Value Date   VD25OH 54 09/13/2015   Current Outpatient Prescriptions on File Prior to Visit  Medication Sig  . acyclovir ointment  5 % Apply 1 application topically daily as needed (breakouts).  . EPIDUO 0.1-2.5 % gel Apply 1 application topically as needed (acne).  Marland Kitchen albuterol HFA inhaler TAKE 2 PUFFS EVERY 4 HOURS DAILY AS NEEDED  . aspirin EC 81 MG tablet Take 1 tablet (81 mg total) by mouth daily.  Marland Kitchen atorvastatin  40 MG tablet Take 40 mg by mouth 3 (three) times a week. On Tuesday, Thursday and Saturday  . busPIRone 15 MG tablet Take 1 tablet at night for the first week, then increase to 1 tablet twice a day  . VITAMIN D  Take 1,000 Units by mouth daily.   . fluticasone  nasal spray USE 2 SPRAYS IN EACH NOSTRIL DAILY  . furosemide 40 MG tablet  Take 1 tablet (40 mg total) by mouth daily.  . metoprolol succ-XL 100 MG  TAKE 1/2 TO 1 TABLET DAILY AS DIRECTED FOR BP  . montelukast  10 MG tablet Take 1 tablet (10 mg total) by mouth daily.  . Omega-3  (FISH OIL Take 1 capsule by mouth daily.  . phentermine  37.5 MG tablet Take 1 tablet (37.5 mg total) by mouth daily before breakfast.  . pregabalin  100 MG capsule TAKE ONE CAPSULE BY MOUTH 3 TIMES A DAY FOR HOT FLASHES  . Red Yeast Rice Extract Take 1 tablet by mouth daily.  . sertraline  25 MG tablet Take 25 mg by mouth daily.  . valACYclovir500 MG tablet Take 1 tablet (500 mg total) by mouth 2 (two) times daily.  . valsartan  320 MG tablet Take 1 tablet (320 mg total) by mouth daily.   Allergies  Allergen Reactions  . Maxitrol [Neomycin-Polymyxin-Dexameth] Other (See Comments)    Pharmacy called asking if we could document "allergy" to Maxitrol.  States patient "unsure of what her symptoms were" requesting we simply document.   PMHx:   Past Medical History:  Diagnosis Date  . Allergy   . Asthma   . Cardiomyopathy ([place])    a. Echo 8/16:  EF 40-45%, apical and ant-septal HK, Gr 2 DD  .  Diverticula, colon   . History of cardiovascular stress test    Myoview 8/16:  EF 48%, anterior and apical defect c/w breast atten, no ischemia; Low Risk  . Hypertension   . Irregular periods/menstrual cycles   . LBBB (left bundle branch block)   . Seasonal allergies   . TIA (transient ischemic attack)   . Torn meniscus    Immunization History  Administered Date(s) Administered  . Influenza Split 10/24/2013  . Influenza,inj,quad, With Preservative 12/24/2015  . PPD Test 06/13/2013, 09/13/2015  . Pneumococcal Polysaccharide-23 02/12/2004  . Td 10/12/2004  . Tdap 01/09/2015   Past Surgical History:  Procedure Laterality Date  . BREAST REDUCTION SURGERY    . CARDIAC CATHETERIZATION    . CESAREAN SECTION     x2  . KNEE SURGERY     partial knee replacement right  . REPAIR QUADRICEPS /  HAMSTRING MUSCLE    . TONSILLECTOMY    . tubes in ears  10/12   FHx:    Reviewed / unchanged  SHx:    Reviewed / unchanged  Systems Review:  Constitutional: Denies fever, chills, wt changes, headaches, insomnia, fatigue, night sweats, change in appetite. Eyes: Denies redness, blurred vision, diplopia, discharge, itchy, watery eyes.  ENT: Denies discharge, congestion, post nasal drip, epistaxis, sore throat, earache, hearing loss, dental pain, tinnitus, vertigo, sinus pain, snoring.  CV: Denies chest pain, palpitations, irregular heartbeat, syncope, dyspnea, diaphoresis, orthopnea, PND, claudication or edema. Respiratory: denies cough, dyspnea, DOE, pleurisy, hoarseness, laryngitis, wheezing.  Gastrointestinal: Denies dysphagia, odynophagia, heartburn, reflux, water brash, abdominal pain or cramps, nausea, vomiting, bloating, diarrhea, constipation, hematemesis, melena, hematochezia  or hemorrhoids. Genitourinary: Denies dysuria, frequency, urgency, nocturia, hesitancy, discharge, hematuria or flank pain. Musculoskeletal: Denies arthralgias, myalgias, stiffness, jt. swelling, pain, limping or strain/sprain.  Skin: Denies pruritus, rash, hives, warts, acne, eczema or change in skin lesion(s). Neuro: No weakness, tremor, incoordination, spasms, paresthesia or pain. Psychiatric: Denies confusion, memory loss or sensory loss. Endo: Denies change in weight, skin or hair change.  Heme/Lymph: No excessive bleeding, bruising or enlarged lymph nodes.  Physical Exam  BP 132/80   Pulse 60   Temp 97 F (36.1 C)   Resp 16   Ht 5' 1.25" (1.556 m)   Wt 142 lb 12.8 oz (64.8 kg)   BMI 26.76 kg/m   Appears well nourished, well groomed  and in no distress.  Eyes: PERRLA, EOMs, conjunctiva no swelling or erythema. Sinuses: No frontal/maxillary tenderness ENT/Mouth: EAC's clear, TM's nl w/o erythema, bulging. Nares clear w/o erythema, swelling, exudates. Oropharynx clear without erythema or  exudates. Oral hygiene is good. Tongue normal, non obstructing. Hearing intact.  Neck: Supple. Thyroid nl. Car 2+/2+ without bruits, nodes or JVD. Chest: Respirations nl with BS clear & equal w/o rales, rhonchi, wheezing or stridor.  Cor: Heart sounds normal w/ regular rate and rhythm without sig. murmurs, gallops, clicks or rubs. Peripheral pulses normal and equal  without edema.  Abdomen: Soft & bowel sounds normal. Non-tender w/o guarding, rebound, hernias, masses or organomegaly.  Lymphatics: Unremarkable.  Musculoskeletal: Full ROM all peripheral extremities, joint stability, 5/5 strength and normal gait.  Skin: Warm, dry without exposed rashes, lesions or ecchymosis apparent.  Neuro: Cranial nerves intact, reflexes equal bilaterally. Sensory-motor testing grossly intact. Tendon reflexes grossly intact.  Pysch: Alert & oriented x 3.  Insight and judgement nl & appropriate. No ideations.  Assessment and Plan:   1. Essential hypertension  - Continue medication, monitor blood pressure at home.  - Continue  DASH diet. Reminder to go to the ER if any CP,  SOB, nausea, dizziness, severe HA, changes vision/speech,  left arm numbness and tingling and jaw pain.  - CBC with Differential/Platelet - BASIC METABOLIC PANEL WITH GFR - Magnesium - TSH  2. Mixed hyperlipidemia  - Continue diet/meds, exercise,& lifestyle modifications.  - Continue monitor periodic cholesterol/liver & renal functions  - Hepatic function panel - Lipid panel - TSH  3. Prediabetes  - Continue diet, exercise, lifestyle modifications.  - Monitor appropriate labs.  - Hemoglobin A1c - Insulin, random  4. Vitamin D deficiency  - Continue supplementation.  - VITAMIN D 25 Hydroxy  5. Medication management  - CBC with Differential/Platelet - BASIC METABOLIC PANEL WITH GFR - Hepatic function panel - Magnesium - Lipid panel - TSH - Hemoglobin A1c - Insulin, random - VITAMIN D 25 Hydroxy         Discussed  regular exercise, BP monitoring, weight control to achieve/maintain BMI less than 25 and discussed med and SE's. Recommended labs to assess and monitor clinical status with further disposition pending results of labs. Over 30 minutes of exam, counseling, chart review was performed.

## 2016-04-22 NOTE — Patient Instructions (Signed)

## 2016-04-23 ENCOUNTER — Ambulatory Visit (INDEPENDENT_AMBULATORY_CARE_PROVIDER_SITE_OTHER): Payer: BLUE CROSS/BLUE SHIELD | Admitting: Internal Medicine

## 2016-04-23 ENCOUNTER — Encounter: Payer: Self-pay | Admitting: Internal Medicine

## 2016-04-23 VITALS — BP 132/80 | HR 60 | Temp 97.0°F | Resp 16 | Ht 61.25 in | Wt 142.8 lb

## 2016-04-23 DIAGNOSIS — E559 Vitamin D deficiency, unspecified: Secondary | ICD-10-CM | POA: Diagnosis not present

## 2016-04-23 DIAGNOSIS — R7303 Prediabetes: Secondary | ICD-10-CM

## 2016-04-23 DIAGNOSIS — I10 Essential (primary) hypertension: Secondary | ICD-10-CM | POA: Diagnosis not present

## 2016-04-23 DIAGNOSIS — E782 Mixed hyperlipidemia: Secondary | ICD-10-CM

## 2016-04-23 DIAGNOSIS — Z79899 Other long term (current) drug therapy: Secondary | ICD-10-CM

## 2016-04-23 LAB — BASIC METABOLIC PANEL WITH GFR
BUN: 21 mg/dL (ref 7–25)
CO2: 26 mmol/L (ref 20–31)
CREATININE: 0.95 mg/dL (ref 0.50–1.05)
Calcium: 9 mg/dL (ref 8.6–10.4)
Chloride: 105 mmol/L (ref 98–110)
GFR, Est African American: 78 mL/min (ref >=60)
GFR, Est Non African American: 68 mL/min (ref >=60)
GLUCOSE: 102 mg/dL — AB (ref 65–99)
Potassium: 4.1 mmol/L (ref 3.5–5.3)
SODIUM: 140 mmol/L (ref 135–146)

## 2016-04-23 LAB — CBC WITH DIFFERENTIAL/PLATELET
BASOS ABS: 74 {cells}/uL (ref 0–200)
Basophils Relative: 1 %
EOS PCT: 2 %
Eosinophils Absolute: 148 cells/uL (ref 15–500)
HCT: 44.5 % (ref 35.0–45.0)
HEMOGLOBIN: 14.9 g/dL (ref 11.7–15.5)
LYMPHS ABS: 2516 {cells}/uL (ref 850–3900)
Lymphocytes Relative: 34 %
MCH: 30.2 pg (ref 27.0–33.0)
MCHC: 33.5 g/dL (ref 32.0–36.0)
MCV: 90.3 fL (ref 80.0–100.0)
MPV: 12.9 fL — ABNORMAL HIGH (ref 7.5–12.5)
Monocytes Absolute: 592 cells/uL (ref 200–950)
Monocytes Relative: 8 %
NEUTROS PCT: 55 %
Neutro Abs: 4070 cells/uL (ref 1500–7800)
Platelets: 347 10*3/uL (ref 140–400)
RBC: 4.93 MIL/uL (ref 3.80–5.10)
RDW: 14.4 % (ref 11.0–15.0)
WBC: 7.4 10*3/uL (ref 3.8–10.8)

## 2016-04-23 LAB — LIPID PANEL
CHOL/HDL RATIO: 5.1 ratio — AB (ref <5.0)
Cholesterol: 177 mg/dL (ref <200)
HDL: 35 mg/dL — ABNORMAL LOW (ref >50)
LDL CALC: 89 mg/dL (ref <100)
Triglycerides: 266 mg/dL — ABNORMAL HIGH (ref <150)
VLDL: 53 mg/dL — ABNORMAL HIGH (ref <30)

## 2016-04-23 LAB — HEPATIC FUNCTION PANEL
ALT: 27 U/L (ref 6–29)
AST: 18 U/L (ref 10–35)
Albumin: 3.8 g/dL (ref 3.6–5.1)
Alkaline Phosphatase: 107 U/L (ref 33–130)
BILIRUBIN DIRECT: 0.1 mg/dL (ref <=0.2)
Indirect Bilirubin: 0.3 mg/dL (ref 0.2–1.2)
Total Bilirubin: 0.4 mg/dL (ref 0.2–1.2)
Total Protein: 6.4 g/dL (ref 6.1–8.1)

## 2016-04-23 LAB — TSH: TSH: 0.89 m[IU]/L

## 2016-04-24 LAB — HEMOGLOBIN A1C
Hgb A1c MFr Bld: 5.7 % — ABNORMAL HIGH (ref <5.7)
Mean Plasma Glucose: 117 mg/dL

## 2016-04-24 LAB — MAGNESIUM: MAGNESIUM: 2 mg/dL (ref 1.5–2.5)

## 2016-04-24 LAB — VITAMIN D 25 HYDROXY (VIT D DEFICIENCY, FRACTURES): VIT D 25 HYDROXY: 65 ng/mL (ref 30–100)

## 2016-04-24 LAB — INSULIN, RANDOM: Insulin: 22.9 u[IU]/mL — ABNORMAL HIGH (ref 2.0–19.6)

## 2016-05-10 ENCOUNTER — Other Ambulatory Visit: Payer: Self-pay | Admitting: Cardiovascular Disease

## 2016-05-11 ENCOUNTER — Other Ambulatory Visit: Payer: Self-pay

## 2016-05-11 MED ORDER — FUROSEMIDE 40 MG PO TABS: 40.0000 mg | ORAL_TABLET | Freq: Every day | ORAL | 2 refills | Status: DC

## 2016-05-18 ENCOUNTER — Other Ambulatory Visit: Payer: Self-pay | Admitting: Obstetrics & Gynecology

## 2016-05-19 LAB — CYTOLOGY - PAP

## 2016-05-25 ENCOUNTER — Other Ambulatory Visit: Payer: Self-pay | Admitting: Internal Medicine

## 2016-06-29 ENCOUNTER — Ambulatory Visit: Payer: BLUE CROSS/BLUE SHIELD | Admitting: Neurology

## 2016-07-10 ENCOUNTER — Other Ambulatory Visit: Payer: Self-pay | Admitting: Internal Medicine

## 2016-07-10 DIAGNOSIS — I10 Essential (primary) hypertension: Secondary | ICD-10-CM

## 2016-07-13 ENCOUNTER — Ambulatory Visit: Payer: Self-pay | Admitting: Neurology

## 2016-07-23 ENCOUNTER — Ambulatory Visit: Payer: Self-pay | Admitting: Physician Assistant

## 2016-08-12 NOTE — Progress Notes (Signed)
Assessment and Plan:   Essential hypertension - continue medications, DASH diet, exercise and monitor at home. Call if greater than 130/80.  -     CBC with Differential/Platelet -     BASIC METABOLIC PANEL WITH GFR -     Hepatic function panel -     TSH  Systolic and diastolic CHF, acute ([place]) Great job with weight loss, can try to cut back on lasix but goal BP close to 110/60 Follow up cardiology  Chronic systolic CHF (congestive heart failure) ([place]) Great job with weight loss, can try to cut back on lasix but goal BP close to 110/60 Follow up cardiology  Hyperlipidemia -continue medications, check lipids, decrease fatty foods, increase activity.  -     Lipid panel  Medication management -     Magnesium  Prediabetes -     Hemoglobin A1c  Other orders -     triamcinolone cream (KENALOG) 0.5 %; Apply 1 application topically 2 (two) times daily.  Continue diet and meds as discussed. Further disposition pending results of labs. Future Appointments Date Time Provider [place]  10/13/2016 9:00 AM Unk Pinto, MD [facility] None    HPI 56 y.o. female  presents for 3 month follow up with hypertension, hyperlipidemia, prediabetes and vitamin D. Her blood pressure has been controlled at home, today their BP is BP: 126/80 She does workout, she walks over 10000 steps daily. She denies chest pain, shortness of breath, dizziness.  She has a history of asthma She has a history of CHF, last echo 2016, EF 40-45% with grade 2 diastolic dysfunction. Her weight is down and she has been trying to do weight loss.  She is not on cholesterol medication and denies myalgias. Her cholesterol is not at goal. The cholesterol last visit was:   Lab Results  Component Value Date   CHOL 177 04/23/2016   HDL 35 (L) 04/23/2016   LDLCALC 89 04/23/2016   TRIG 266 (H) 04/23/2016   CHOLHDL 5.1 (H) 04/23/2016   She has been working on diet and exercise for prediabetes, and denies paresthesia  of the feet, polydipsia and polyuria. Last A1C in the office was:  Lab Results  Component Value Date   HGBA1C 5.7 (H) 04/23/2016   Patient is on Vitamin D supplement.   Lab Results  Component Value Date   VD25OH 65 04/23/2016    BMI is Body mass index is 25.56 kg/m., she is working on diet and exercise. Wt Readings from Last 3 Encounters:  08/13/16 136 lb 6.4 oz (61.9 kg)  04/23/16 142 lb 12.8 oz (64.8 kg)  12/30/15 144 lb 7 oz (65.5 kg)     Current Medications:  Current Outpatient Prescriptions on File Prior to Visit  Medication Sig Dispense Refill  . acyclovir ointment (ZOVIRAX) 5 % Apply 1 application topically daily as needed (breakouts). 30 g 0  . Adapalene-Benzoyl Peroxide (EPIDUO) 0.1-2.5 % gel Apply 1 application topically as needed (acne). 45 g 0  . albuterol (VENTOLIN HFA) 108 (90 Base) MCG/ACT inhaler TAKE 2 PUFFS EVERY 4 HOURS DAILY AS NEEDED 18 Inhaler 99  . aspirin EC 81 MG tablet Take 1 tablet (81 mg total) by mouth daily.    Marland Kitchen atorvastatin (LIPITOR) 40 MG tablet Take 40 mg by mouth 3 (three) times a week. On Tuesday, Thursday and Saturday    . busPIRone (BUSPAR) 15 MG tablet Take 1 tablet at night for the first week, then increase to 1 tablet twice a day 60 tablet  6  . Cholecalciferol (VITAMIN D PO) Take 1,000 Units by mouth daily.     . fluticasone (FLONASE) 50 MCG/ACT nasal spray USE 2 SPRAYS IN EACH NOSTRIL DAILY 48 g 1  . furosemide (LASIX) 40 MG tablet Take 1 tablet (40 mg total) by mouth daily. 30 tablet 2  . metoprolol succinate (TOPROL-XL) 100 MG 24 hr tablet TAKE 1/2 TO 1 TABLET DAILY AS DIRECTED FOR BP 90 tablet 1  . montelukast (SINGULAIR) 10 MG tablet TAKE 1 TABLET DAILY 30 tablet 5  . Omega-3 Fatty Acids (FISH OIL PO) Take 1 capsule by mouth daily.    . phentermine (ADIPEX-P) 37.5 MG tablet Take 1 tablet (37.5 mg total) by mouth daily before breakfast. 30 tablet 2  . Red Yeast Rice Extract (RED YEAST RICE PO) Take 1 tablet by mouth daily.    .  sertraline (ZOLOFT) 25 MG tablet Take 25 mg by mouth daily.    . valACYclovir (VALTREX) 500 MG tablet Take 1 tablet (500 mg total) by mouth 2 (two) times daily. 90 tablet 0  . valsartan (DIOVAN) 320 MG tablet TAKE 1 TABLET (320 MG TOTAL) BY MOUTH DAILY. 30 tablet 4   No current facility-administered medications on file prior to visit.    Medical History:  Past Medical History:  Diagnosis Date  . Allergy   . Asthma   . Cardiomyopathy ([place])    a. Echo 8/16:  EF 40-45%, apical and ant-septal HK, Gr 2 DD  . Diverticula, colon   . History of cardiovascular stress test    Myoview 8/16:  EF 48%, anterior and apical defect c/w breast atten, no ischemia; Low Risk  . Hypertension   . Irregular periods/menstrual cycles   . LBBB (left bundle branch block)   . Seasonal allergies   . TIA (transient ischemic attack)   . Torn meniscus    Allergies:  Allergies  Allergen Reactions  . Maxitrol [Neomycin-Polymyxin-Dexameth] Other (See Comments)    Pharmacy called asking if we could document "allergy" to Maxitrol.  States patient "unsure of what her symptoms were" requesting we simply document.   Review of Systems  Constitutional: Negative.   HENT: Negative.   Eyes: Negative.   Respiratory: Negative.   Cardiovascular: Negative.   Gastrointestinal: Negative.   Genitourinary: Negative.   Musculoskeletal: Negative.   Skin: Negative.   Neurological: Negative.   Endo/Heme/Allergies: Negative.   Psychiatric/Behavioral: Negative.      Family history- Review and unchanged Social history- Review and unchanged Physical Exam: BP 126/80   Pulse 63   Temp 97.7 F (36.5 C)   Resp 16   Ht 5' 1.25" (1.556 m)   Wt 136 lb 6.4 oz (61.9 kg)   SpO2 97%   BMI 25.56 kg/m  Wt Readings from Last 3 Encounters:  08/13/16 136 lb 6.4 oz (61.9 kg)  04/23/16 142 lb 12.8 oz (64.8 kg)  12/30/15 144 lb 7 oz (65.5 kg)   General Appearance: Well nourished, in no apparent distress. Eyes: PERRLA, EOMs,  conjunctiva no swelling or erythema Sinuses: No Frontal/maxillary tenderness ENT/Mouth: Ext aud canals clear, TMs without erythema, bulging. No erythema, swelling, or exudate on post pharynx.  Tonsils not swollen or erythematous. Hearing normal.  Neck: Supple, thyroid normal.  Respiratory: Respiratory effort normal, BS equal bilaterally without rales, rhonchi, wheezing or stridor.  Cardio: RRR with no MRGs. Brisk peripheral pulses without edema.  Abdomen: Soft, + BS.  Non tender, no guarding, rebound, hernias, masses. Lymphatics: Non tender without lymphadenopathy.  Musculoskeletal:  Full ROM, 5/5 strength, normal gait.  Skin: Warm, dry without rashes, lesions, ecchymosis.  Neuro: Cranial nerves intact. Normal muscle tone, no cerebellar symptoms. Sensation intact.  Psych: Awake and oriented X 3, normal affect, Insight and Judgment appropriate.    Vicie Mutters 11:14 AM

## 2016-08-13 ENCOUNTER — Encounter: Payer: Self-pay | Admitting: Physician Assistant

## 2016-08-13 ENCOUNTER — Ambulatory Visit (INDEPENDENT_AMBULATORY_CARE_PROVIDER_SITE_OTHER): Payer: BLUE CROSS/BLUE SHIELD | Admitting: Physician Assistant

## 2016-08-13 VITALS — BP 126/80 | HR 63 | Temp 97.7°F | Resp 16 | Ht 61.25 in | Wt 136.4 lb

## 2016-08-13 DIAGNOSIS — R7303 Prediabetes: Secondary | ICD-10-CM

## 2016-08-13 DIAGNOSIS — E782 Mixed hyperlipidemia: Secondary | ICD-10-CM

## 2016-08-13 DIAGNOSIS — I5041 Acute combined systolic (congestive) and diastolic (congestive) heart failure: Secondary | ICD-10-CM | POA: Diagnosis not present

## 2016-08-13 DIAGNOSIS — Z79899 Other long term (current) drug therapy: Secondary | ICD-10-CM | POA: Diagnosis not present

## 2016-08-13 DIAGNOSIS — I5022 Chronic systolic (congestive) heart failure: Secondary | ICD-10-CM

## 2016-08-13 DIAGNOSIS — I10 Essential (primary) hypertension: Secondary | ICD-10-CM

## 2016-08-13 LAB — CBC WITH DIFFERENTIAL/PLATELET
BASOS PCT: 1 %
Basophils Absolute: 83 cells/uL (ref 0–200)
EOS ABS: 83 {cells}/uL (ref 15–500)
Eosinophils Relative: 1 %
HEMATOCRIT: 45.3 % — AB (ref 35.0–45.0)
HEMOGLOBIN: 15.2 g/dL (ref 11.7–15.5)
Lymphocytes Relative: 28 %
Lymphs Abs: 2324 cells/uL (ref 850–3900)
MCH: 30.6 pg (ref 27.0–33.0)
MCHC: 33.6 g/dL (ref 32.0–36.0)
MCV: 91.1 fL (ref 80.0–100.0)
MONO ABS: 581 {cells}/uL (ref 200–950)
MPV: 12.8 fL — AB (ref 7.5–12.5)
Monocytes Relative: 7 %
NEUTROS ABS: 5229 {cells}/uL (ref 1500–7800)
Neutrophils Relative %: 63 %
PLATELETS: 308 10*3/uL (ref 140–400)
RBC: 4.97 MIL/uL (ref 3.80–5.10)
RDW: 13.9 % (ref 11.0–15.0)
WBC: 8.3 10*3/uL (ref 3.8–10.8)

## 2016-08-13 LAB — HEPATIC FUNCTION PANEL
ALBUMIN: 4.2 g/dL (ref 3.6–5.1)
ALK PHOS: 103 U/L (ref 33–130)
ALT: 20 U/L (ref 6–29)
AST: 19 U/L (ref 10–35)
BILIRUBIN INDIRECT: 0.7 mg/dL (ref 0.2–1.2)
BILIRUBIN TOTAL: 0.8 mg/dL (ref 0.2–1.2)
Bilirubin, Direct: 0.1 mg/dL (ref <=0.2)
Total Protein: 6.7 g/dL (ref 6.1–8.1)

## 2016-08-13 LAB — BASIC METABOLIC PANEL WITH GFR
BUN: 15 mg/dL (ref 7–25)
CALCIUM: 9.2 mg/dL (ref 8.6–10.4)
CO2: 24 mmol/L (ref 20–31)
Chloride: 100 mmol/L (ref 98–110)
Creat: 0.9 mg/dL (ref 0.50–1.05)
GFR, EST AFRICAN AMERICAN: 83 mL/min (ref >=60)
GFR, EST NON AFRICAN AMERICAN: 72 mL/min (ref >=60)
Glucose, Bld: 96 mg/dL (ref 65–99)
POTASSIUM: 4.2 mmol/L (ref 3.5–5.3)
Sodium: 136 mmol/L (ref 135–146)

## 2016-08-13 LAB — LIPID PANEL
CHOLESTEROL: 149 mg/dL (ref <200)
HDL: 33 mg/dL — AB (ref >50)
LDL CALC: 73 mg/dL (ref <100)
TRIGLYCERIDES: 215 mg/dL — AB (ref <150)
Total CHOL/HDL Ratio: 4.5 Ratio (ref <5.0)
VLDL: 43 mg/dL — ABNORMAL HIGH (ref <30)

## 2016-08-13 LAB — TSH: TSH: 1.62 mIU/L

## 2016-08-13 MED ORDER — TRIAMCINOLONE ACETONIDE 0.5 % EX CREA
1.0000 | TOPICAL_CREAM | Freq: Two times a day (BID) | CUTANEOUS | 2 refills | Status: DC
Start: 2016-08-13 — End: 2016-09-09

## 2016-08-13 NOTE — Patient Instructions (Signed)
We want weight loss that will last so you should lose 1-2 pounds a week.  THAT IS IT! Please pick THREE things a month to change. Once it is a habit check off the item. Then pick another three items off the list to become habits.  If you are already doing a habit on the list GREAT!  Cross that item off! o Don't drink your calories. Ie, alcohol, soda, fruit juice, and sweet tea.  o Drink more water. Drink a glass when you feel hungry or before each meal.  o Eat breakfast - Complex carb and protein (likeDannon light and fit yogurt, oatmeal, fruit, eggs, [place] bacon). o Measure your cereal.  Eat no more than one cup a day. (ie [place]) o Eat an apple a day. o Add a vegetable a day. o Try a new vegetable a month. o Use Pam! Stop using oil or butter to cook. o Don't finish your plate or use smaller plates. o Share your dessert. o Eat sugar free Jello for dessert or frozen grapes. o Don't eat 2-3 hours before bed. o Switch to whole wheat bread, pasta, and brown rice. o Make healthier choices when you eat out. No fries! o Pick baked chicken, NOT fried. o Don't forget to SLOW DOWN when you eat. It is not going anywhere.  o Take the stairs. o Park far away in the parking lot o [employer] (or weights) for 10 minutes while watching TV. o Walk at work for 10 minutes during break. o Walk outside 1 time a week with your friend, kids, dog, or significant other. o Start a walking group at [place] the mall as much as you can tolerate.  o Keep a food diary. o Weigh yourself daily. o Walk for 15 minutes 3 days per week. o Cook at home more often and eat out less.  If life happens and you go back to old habits, it is okay.  Just start over. You can do it!   If you experience chest pain, get short of breath, or tired during the exercise, please stop immediately and inform your doctor.    Simple math prevails.    1st - exercise does not produce significant weight loss - at best one converts fat  into muscle , "bulks up", loses inches, but usually stays "weight neutral"     2nd - think of your body weightas a check book: If you eat more calories than you burn up - you save money or gain weight .... Or if you spend more money than you put in the check book, ie burn up more calories than you eat, then you lose weight     3rd - if you walk or run 1 mile, you burn up 100 calories - you have to burn up 3,500 calories to lose 1 pound, ie you have to walk/run 35 miles to lose 1 measly pound. So if you want to lose 10 #, then you have to walk/run 350 miles, so.... clearly exercise is not the solution.     4. So if you consume 1,500 calories, then you have to burn up the equivalent of 15 miles to stay weight neutral - It also stands to reason that if you consume 1,500 cal/day and don't lose weight, then you must be burning up about 1,500 cals/day to stay weight neutral.     5. If you really want to lose weight, you must cut your calorie intake 300 calories /day and  at that rate you should lose about 1 # every 3 days.   6. Please purchase Dr Fara Olden Fuhrman's book(s) "The End of Dieting" & "Eat to Live" . It has some great concepts and recipes.

## 2016-08-14 LAB — HEMOGLOBIN A1C
Hgb A1c MFr Bld: 5.6 % (ref <5.7)
MEAN PLASMA GLUCOSE: 114 mg/dL

## 2016-08-14 LAB — MAGNESIUM: Magnesium: 2.2 mg/dL (ref 1.5–2.5)

## 2016-08-14 NOTE — Progress Notes (Signed)
Pt aware of lab results & voiced understanding of those results.

## 2016-08-16 NOTE — Progress Notes (Deleted)
Cardiology Office Note:    Date:  08/16/2016   ID:  Gail Bailey, DOB 12/21/1960, MRN [id number]  PCP:  Unk Pinto, MD  Cardiologist:  Dr. Acie Fredrickson   Referring MD: Unk Pinto, MD   No chief complaint on file. ***  History of Present Illness:    Gail Bailey is a 56 y.o. female with a past medical history significant for LBBB, hypertension, cardiomyopathy with EF 40-45% in 2016.   She was last seen by Dr. Acie Fredrickson on 10/18/15 at which time she was doing well. She was seeing a neurologist for TIA symptoms. Her carotids were normal. At her recent internal medicine appointment it was noted that she had lost some weight and was going to cut back on her Lasix with blood pressure goal close to 110/60. A complete laboratory panel was done that showed normal electrolytes and kidney function, normal liver function, LDL of 73, normal TSH, normal CBC.  She had a low risk stress test in 2016  Past Medical History:  Diagnosis Date  . Allergy   . Asthma   . Cardiomyopathy ([place])    a. Echo 8/16:  EF 40-45%, apical and ant-septal HK, Gr 2 DD  . Diverticula, colon   . History of cardiovascular stress test    Myoview 8/16:  EF 48%, anterior and apical defect c/w breast atten, no ischemia; Low Risk  . Hypertension   . Irregular periods/menstrual cycles   . LBBB (left bundle branch block)   . Seasonal allergies   . TIA (transient ischemic attack)   . Torn meniscus     Past Surgical History:  Procedure Laterality Date  . BREAST REDUCTION SURGERY    . CARDIAC CATHETERIZATION    . CESAREAN SECTION     x2  . KNEE SURGERY     partial knee replacement right  . REPAIR QUADRICEPS / HAMSTRING MUSCLE    . TONSILLECTOMY    . tubes in ears  10/12    Current Medications: No outpatient prescriptions have been marked as taking for the 08/17/16 encounter (Appointment) with Richardson Dopp T, PA-C.     Allergies:   Maxitrol [neomycin-polymyxin-dexameth]   Social History   Social History  .  Marital status: Divorced    Spouse name: Gail Bailey  . Number of children: 2  . Years of education: Gail Bailey   Occupational History  . [place]   Social History Main Topics  . Smoking status: Never Smoker  . Smokeless tobacco: Never Used  . Alcohol use Yes     Comment: 1-2 glasses wine nightly  . Drug use: No  . Sexual activity: Not on file   Other Topics Concern  . Not on file   Social History Narrative  . No narrative on file     Family History: The patient's ***family history includes Asthma in her sister and son; Cancer in her maternal grandmother; Diabetes in her mother; Heart attack in her paternal grandmother; Heart attack (age of onset: 52) in her mother; Heart disease in her mother; Hypertension in her mother; Other in her father. There is no history of Colon cancer. ROS:   Please see the history of present illness.    *** All other systems reviewed and are negative.  EKGs/Labs/Other Studies Reviewed:    The following studies were reviewed today:  Echocardiogram 08/19/14 Study Conclusions  - Left ventricle: The cavity size was normal. Wall thickness was   normal. Systolic function was mildly to moderately reduced. The  estimated ejection fraction was in the range of 40% to 45%.   Severe hypokinesis of the apicalanteroseptal and apical   myocardium. Features are consistent with a pseudonormal left   ventricular filling pattern, with concomitant abnormal relaxation   and increased filling pressure (grade 2 diastolic dysfunction)  Myocardial perfusion imaging 09/04/14 Myoview low risk - no ischemia. Did have breast attenuation. EF mildly decreased but this has been noted by echo.  Cardiac MRI 03/04/15 Normal LV size and function with no regional wall motion abnormalities, EF 59%. No infarct on delayed inversion recovery sequences using gadolinium. Mild LAE  EKG:  EKG is *** ordered today.  The ekg ordered today demonstrates ***  Recent  Labs: 08/13/2016: ALT 20; BUN 15; Creat 0.90; Hemoglobin 15.2; Magnesium 2.2; Platelets 308; Potassium 4.2; Sodium 136; TSH 1.62   Recent Lipid Panel    Component Value Date/Time   CHOL 149 08/13/2016 1123   TRIG 215 (H) 08/13/2016 1123   HDL 33 (L) 08/13/2016 1123   CHOLHDL 4.5 08/13/2016 1123   VLDL 43 (H) 08/13/2016 1123   LDLCALC 73 08/13/2016 1123    Physical Exam:    VS:  There were no vitals taken for this visit.    Wt Readings from Last 3 Encounters:  08/13/16 136 lb 6.4 oz (61.9 kg)  04/23/16 142 lb 12.8 oz (64.8 kg)  12/30/15 144 lb 7 oz (65.5 kg)     Physical Exam***   ASSESSMENT:    No diagnosis found. PLAN:    In order of problems listed above:  1. Chronic systolic and diastolic heart failure  (Echocardiogram 08/2014 showed40% to 45%, Severe hypokinesis of the apicalanteroseptal and apical myocardium and grade 2 diastolic dysfunction Currently asymptomatic  Lasix 40 mg daily, Toprol-XL 50 mg daily, valsartan 320 mg daily.    2. Hypertension  (Controlled on ARB, Lasix, Toprol  3. Hyperlipidemia  (Lipid panel on 08/13/16 showed LDL 73. Continue current statin.    Medication Adjustments/Labs and Tests Ordered: Current medicines are reviewed at length with the patient today.  Concerns regarding medicines are outlined above. Labs and tests ordered and medication changes are outlined in the patient instructions below:  There are no Patient Instructions on file for this visit.   Signed, Daune Perch, NP  08/16/2016 8:01 PM    [facility]

## 2016-08-17 ENCOUNTER — Ambulatory Visit: Payer: Self-pay | Admitting: Physician Assistant

## 2016-08-26 ENCOUNTER — Other Ambulatory Visit: Payer: Self-pay | Admitting: Internal Medicine

## 2016-08-31 ENCOUNTER — Other Ambulatory Visit: Payer: Self-pay | Admitting: Internal Medicine

## 2016-08-31 DIAGNOSIS — I10 Essential (primary) hypertension: Secondary | ICD-10-CM

## 2016-08-31 MED ORDER — LOSARTAN POTASSIUM 100 MG PO TABS: ORAL_TABLET | ORAL | 1 refills | Status: DC

## 2016-09-09 ENCOUNTER — Encounter: Payer: Self-pay | Admitting: Physician Assistant

## 2016-09-09 ENCOUNTER — Ambulatory Visit (INDEPENDENT_AMBULATORY_CARE_PROVIDER_SITE_OTHER): Payer: BLUE CROSS/BLUE SHIELD | Admitting: Physician Assistant

## 2016-09-09 VITALS — BP 130/80 | HR 64 | Ht 61.0 in | Wt 139.0 lb

## 2016-09-09 DIAGNOSIS — Z8673 Personal history of transient ischemic attack (TIA), and cerebral infarction without residual deficits: Secondary | ICD-10-CM | POA: Diagnosis not present

## 2016-09-09 DIAGNOSIS — I10 Essential (primary) hypertension: Secondary | ICD-10-CM

## 2016-09-09 DIAGNOSIS — R079 Chest pain, unspecified: Secondary | ICD-10-CM

## 2016-09-09 NOTE — Patient Instructions (Addendum)
Medication Instructions:  1. Your physician recommends that you continue on your current medications as directed. Please refer to the Current Medication list given to you today.   Labwork: NONE ORDERED   Testing/Procedures: 1. Your physician has requested that you have a lexiscan myoview. For further information please visit [email or web address]. Please follow instruction sheet, as given.    Follow-Up: Your physician wants you to follow-up in: [place]. You will receive a reminder letter in the mail two months in advance. If you don't receive a letter, please call our office to schedule the follow-up appointment.   Any Other Special Instructions Will Be Listed Below (If Applicable).  TRY OTC PEPCID 20 MG TWICE DAILY FOR 1-2 WEEKS    If you need a refill on your cardiac medications before your next appointment, please call your pharmacy.

## 2016-09-09 NOTE — Progress Notes (Signed)
Cardiology Office Note:    Date:  09/09/2016   ID:  Gail Bailey, DOB July 27, 1960, MRN [id number]  PCP:  Unk Pinto, MD  Cardiologist:  Dr. Liam Rogers    Referring MD: Unk Pinto, MD   Chief Complaint  Patient presents with  . Chest Pain    History of Present Illness:    Gail Bailey is a 56 y.o. female with a hx of HTN, prior TIA. She was admitted in 8/16 for symptoms of TIA. Echo demonstrated EF 40-45 with apical and anteroseptal hypokinesis.  A follow up nuclear stress test demonstrated no ischemia.  Her EF was 48%.  She was ultimately set up for a cardiac MRI that demonstrated normal LVF with EF 59%.  Last seen by Dr. Liam Rogers in 10/17.    Gail Bailey returns for evaluation of chest discomfort. She was in [place] back in February/March. She was working and developed chest discomfort radiating to her arm with associated dyspnea. Her blood pressure was 198/95. She ended up going to the emergency room in [facility]. She had serial cardiac enzymes that were negative per her report. She had no other testing done. She's had a few other episodes of chest pain since that time. She's had heartburn for the first time in her life. She takes Tums with minimal relief. She continues to exercise by walking 15,000 steps a day and doing yoga 3-4 times a week. She denies exertional chest symptoms. She notes dyspnea with more extreme activities. She sleeps on 2 pillows. She denies PND or significant edema  Prior CV studies:   The following studies were reviewed today:  Cardiac MRI 03/04/15 IMPRESSION: 1) Normal LV size and function with no RWMAls EF 59% 2) No infarct on delayed inversion recovery sequences using gadolinium 3) Mild LAE  Nuclear stress test 09/04/14 Low risk stress nuclear study with a medium size, severe intensity, fixed distal anterior and apical defect consistent with breast attenuation; no ischemia; mild global reduction in LV function (EF 48).  Echo 08/19/14 EF  40-45%, apical and anteroseptal severe HK, Gr 2 DD.   Past Medical History:  Diagnosis Date  . Allergy   . Asthma   . Cardiomyopathy ([place])    a. Echo 8/16:  EF 40-45%, apical and ant-septal HK, Gr 2 DD  . Diverticula, colon   . History of cardiovascular stress test    Myoview 8/16:  EF 48%, anterior and apical defect c/w breast atten, no ischemia; Low Risk  . Hypertension   . Irregular periods/menstrual cycles   . LBBB (left bundle branch block)   . Seasonal allergies   . TIA (transient ischemic attack)   . Torn meniscus     Past Surgical History:  Procedure Laterality Date  . BREAST REDUCTION SURGERY    . CARDIAC CATHETERIZATION    . CESAREAN SECTION     x2  . KNEE SURGERY     partial knee replacement right  . REPAIR QUADRICEPS / HAMSTRING MUSCLE    . TONSILLECTOMY    . tubes in ears  10/12    Current Medications: Current Meds  Medication Sig  . acyclovir ointment (ZOVIRAX) 5 % Apply 1 application topically daily as needed (breakouts).  . Adapalene-Benzoyl Peroxide (EPIDUO) 0.1-2.5 % gel Apply 1 application topically as needed (acne).  Marland Kitchen albuterol (PROVENTIL HFA;VENTOLIN HFA) 108 (90 Base) MCG/ACT inhaler Inhale 2 puffs into the lungs every 4 (four) hours as needed for wheezing or shortness of breath.  Marland Kitchen aspirin EC 81 MG  tablet Take 1 tablet (81 mg total) by mouth daily.  Marland Kitchen atorvastatin (LIPITOR) 40 MG tablet Take 40 mg by mouth 3 (three) times a week. On Tuesday, Thursday and Saturday  . busPIRone (BUSPAR) 15 MG tablet Take 15 mg by mouth 2 (two) times daily.  . Cholecalciferol (VITAMIN D PO) Take 1,000 Units by mouth daily.   Marland Kitchen estradiol (ESTRACE) 0.5 MG tablet Take 0.5 mg by mouth daily.  . fluticasone (FLONASE) 50 MCG/ACT nasal spray USE 2 SPRAYS IN EACH NOSTRIL DAILY  . furosemide (LASIX) 40 MG tablet Take 1 tablet (40 mg total) by mouth daily.  Marland Kitchen losartan (COZAAR) 100 MG tablet Take 1 tablet daily for BP  . medroxyPROGESTERone (PROVERA) 2.5 MG tablet Take 2.5 mg  by mouth daily.  . metoprolol succinate (TOPROL-XL) 100 MG 24 hr tablet Take 50-100 mg by mouth as directed. For blood pressure  . montelukast (SINGULAIR) 10 MG tablet Take 10 mg by mouth daily.  . Omega-3 Fatty Acids (FISH OIL PO) Take 1 capsule by mouth daily.  . Red Yeast Rice Extract (RED YEAST RICE PO) Take 1 tablet by mouth daily.  . valACYclovir (VALTREX) 500 MG tablet Take 500 mg by mouth 2 (two) times daily as needed (fever blister outbreak).     Allergies:   Maxitrol [neomycin-polymyxin-dexameth]   Social History   Social History  . Marital status: Divorced    Spouse name: N/A  . Number of children: 2  . Years of education: N/A   Occupational History  . [employer]   Social History Main Topics  . Smoking status: Never Smoker  . Smokeless tobacco: Never Used  . Alcohol use Yes     Comment: 1-2 glasses wine nightly  . Drug use: No  . Sexual activity: Not Asked   Other Topics Concern  . None   Social History Narrative  . None     Family Hx: The patient's family history includes Asthma in her sister and son; Cancer in her maternal grandmother; Diabetes in her mother; Heart attack in her paternal grandmother; Heart attack (age of onset: 35) in her mother; Heart disease in her mother; Hypertension in her mother; Other in her father. There is no history of Colon cancer.  ROS:   Please see the history of present illness.    Review of Systems  Cardiovascular: Positive for chest pain.   All other systems reviewed and are negative.   EKGs/Labs/Other Test Reviewed:    EKG:  EKG is  ordered today.  The ekg ordered today demonstrates NSR, HR 64, IVCD, QTc 427, no significant change  Recent Labs: 08/13/2016: ALT 20; BUN 15; Creat 0.90; Hemoglobin 15.2; Magnesium 2.2; Platelets 308; Potassium 4.2; Sodium 136; TSH 1.62   Recent Lipid Panel Lab Results  Component Value Date/Time   CHOL 149 08/13/2016 11:23 AM   TRIG 215 (H) 08/13/2016 11:23 AM     HDL 33 (L) 08/13/2016 11:23 AM   CHOLHDL 4.5 08/13/2016 11:23 AM   LDLCALC 73 08/13/2016 11:23 AM    Physical Exam:    VS:  BP 130/80   Pulse 64   Ht 5\' 1"  (1.549 m)   Wt 139 lb (63 kg)   BMI 26.26 kg/m     Wt Readings from Last 3 Encounters:  09/09/16 139 lb (63 kg)  08/13/16 136 lb 6.4 oz (61.9 kg)  04/23/16 142 lb 12.8 oz (64.8 kg)     Physical Exam  Constitutional: She is oriented to  person, place, and time. She appears well-developed and well-nourished. No distress.  HENT:  Head: Normocephalic and atraumatic.  Eyes: No scleral icterus.  Neck: Normal range of motion. No JVD present.  Cardiovascular: Normal rate, regular rhythm, S1 normal, S2 normal and normal heart sounds.   No murmur heard. Pulmonary/Chest: Breath sounds normal. She has no rhonchi. She has no rales.  Abdominal: Soft. There is no tenderness.  Musculoskeletal: She exhibits edema (trace bilat LE edema).  Neurological: She is alert and oriented to person, place, and time.  Skin: Skin is warm and dry.  Psychiatric: She has a normal mood and affect.    ASSESSMENT:    1. Chest pain, unspecified type   2. Essential hypertension   3. History of TIA (transient ischemic attack)    PLAN:    In order of problems listed above:  1. Chest pain, unspecified type -   She has typical and atypical features. She had a cardiac catheterization many years ago that showed no CAD. She had a stress test 2 years ago with no ischemia but reduced EF. She also underwent cardiac MRI which demonstrated normal ejection fraction. She remains on beta blocker and ARB. She also notes a history of indigestion that is unusual for her. I have recommended proceeding with stress testing to further evaluate and rule out ischemia. As noted, given her prior nuclear stress test results, I suspect her EF will be down. Mainly, the test will be done to assess for ischemia.  2. Essential hypertension - Blood pressure is close to target. She  recently changed valsartan to losartan secondary to the recall. Continue current therapy.  3. History of TIA (transient ischemic attack)  Continue aspirin, statin.   Dispo:  Return in about 6 months (around 03/11/2017) for Routine Follow Up, w/ Dr. Acie Fredrickson.   Medication Adjustments/Labs and Tests Ordered: Current medicines are reviewed at length with the patient today.  Concerns regarding medicines are outlined above.  Tests Ordered: Orders Placed This Encounter  Procedures  . Myocardial Perfusion Imaging  . EKG 12-Lead   Medication Changes: No orders of the defined types were placed in this encounter.   Signed, Richardson Dopp, PA-C  09/09/2016 5:31 PM    [facility] [address], [place], [place]  [address] Phone: [phone]; Fax: [phone]

## 2016-09-10 ENCOUNTER — Telehealth (HOSPITAL_COMMUNITY): Payer: Self-pay | Admitting: *Deleted

## 2016-09-10 NOTE — Telephone Encounter (Signed)
Left message on voicemail per DPR in reference to upcoming appointment scheduled on 09/11/16 at 7:15 with detailed instructions given per Myocardial Perfusion Study Information Sheet for the test. LM to arrive 15 minutes early, and that it is imperative to arrive on time for appointment to keep from having the test rescheduled. If you need to cancel or reschedule your appointment, please call the office within 24 hours of your appointment. Failure to do so may result in a cancellation of your appointment, and a $50 no show fee. Phone number given for call back for any questions.  Gail Bailey

## 2016-09-11 ENCOUNTER — Other Ambulatory Visit: Payer: Self-pay | Admitting: Physician Assistant

## 2016-09-11 ENCOUNTER — Ambulatory Visit (HOSPITAL_COMMUNITY): Payer: BLUE CROSS/BLUE SHIELD | Attending: Cardiovascular Disease

## 2016-09-11 ENCOUNTER — Telehealth: Payer: Self-pay | Admitting: Physician Assistant

## 2016-09-11 DIAGNOSIS — R9439 Abnormal result of other cardiovascular function study: Secondary | ICD-10-CM | POA: Diagnosis not present

## 2016-09-11 DIAGNOSIS — R079 Chest pain, unspecified: Secondary | ICD-10-CM | POA: Insufficient documentation

## 2016-09-11 DIAGNOSIS — R072 Precordial pain: Secondary | ICD-10-CM

## 2016-09-11 DIAGNOSIS — Z8249 Family history of ischemic heart disease and other diseases of the circulatory system: Secondary | ICD-10-CM | POA: Insufficient documentation

## 2016-09-11 DIAGNOSIS — I447 Left bundle-branch block, unspecified: Secondary | ICD-10-CM | POA: Insufficient documentation

## 2016-09-11 DIAGNOSIS — R0609 Other forms of dyspnea: Secondary | ICD-10-CM | POA: Diagnosis not present

## 2016-09-11 DIAGNOSIS — I10 Essential (primary) hypertension: Secondary | ICD-10-CM | POA: Diagnosis not present

## 2016-09-11 MED ORDER — TECHNETIUM TC 99M TETROFOSMIN IV KIT
33.0000 | PACK | Freq: Once | INTRAVENOUS | Status: AC | PRN
  Administered 2016-09-11: 33 via INTRAVENOUS
  Filled 2016-09-11: qty 33

## 2016-09-11 MED ORDER — REGADENOSON 0.4 MG/5ML IV SOLN
0.4000 mg | Freq: Once | INTRAVENOUS | Status: AC
  Administered 2016-09-11: 0.4 mg via INTRAVENOUS

## 2016-09-11 NOTE — Telephone Encounter (Addendum)
I reviewed her case with Dr. Liam Rogers. There is some concern the Nuclear stress test will be difficult to ready. We considered canceling the stress test and getting a coronary CTA. It looks like she may have had a portion of the stress test already. If so, will continue with the stress myoview and hold off on the CTA. If the stress test has not been started yet, let me know so we can change it to a CTA. Richardson Dopp, PA-C    09/11/2016 5:40 PM

## 2016-09-11 NOTE — Telephone Encounter (Signed)
-----   Message from Thayer Headings, MD sent at 09/11/2016  4:38 PM EDT ----- Gail Bailey has had several stress tests in the past-most of which have revealed anterior attenuation due to breast artifact. I suspect this study will show the same. Might consider doing a coronary CT angiogram with FFR if she is having CP   Thanks

## 2016-09-11 NOTE — Telephone Encounter (Signed)
I called the pt per Brynda Rim. PA to cancel Myoview and order a Coronary CT. Just after I hung up leaving message for the pt, new message came across from PA stating:   Please call patient.  I reviewed her case with Dr. Liam Rogers. There is some concern the Nuclear stress test will be difficult to ready. We considered canceling the stress test and getting a coronary CTA. It looks like she may have had a portion of the stress test already. If so, will continue with the stress myoview and hold off on the CTA. If the stress test has not been started yet, let me know so we can change it to a CTA. Richardson Dopp, PA-C    09/11/2016 5:40 PM    I will call the pt back on Tuesday 09/15/16 to let her know that we will continue with the Myoview and hold off on scheduling Coronary CT.

## 2016-09-15 NOTE — Telephone Encounter (Signed)
New Message ° ° pt verbalized that she is returning call for rn °

## 2016-09-15 NOTE — Telephone Encounter (Signed)
Pt returned my call from Friday. I had called pt based on recommendation per Brynda Rim. PA and Dr. Acie Fredrickson to change from [facility] to Coronary CTA: see below:  I d/w pt due to part of the Myoview has been done and she is scheduled for 09/16/16 7:30 the 2 nd part of Myoview that we will continue with the Myoview at this time and not order a Coronary CTA. Pt is agreeable to this plan of care at this time.   Please call patient. I reviewed her case with Dr. Liam Rogers. There is some concern the Nuclear stress test will be difficult to ready. We considered canceling the stress test and getting a coronary CTA. It looks like she may have had a portion of the stress test already. If so, will continue with the stress myoview and hold off on the CTA. If the stress test has not been started yet, let me know so we can change it to a CTA. Richardson Dopp, [place] 8/31/20185:40 PM  Please see message from Dr. Acie Fredrickson: ----- Message from Thayer Headings, MD sent at 09/11/2016  4:38 PM EDT ----- Jenny Reichmann has had several stress tests in the past-most of which have revealed anterior attenuation due to breast artifact. I suspect this study will show the same. Might consider doing a coronary CT angiogram with FFR if she is having CP   Thanks

## 2016-09-16 ENCOUNTER — Telehealth: Payer: Self-pay | Admitting: *Deleted

## 2016-09-16 ENCOUNTER — Ambulatory Visit (HOSPITAL_COMMUNITY): Payer: BLUE CROSS/BLUE SHIELD | Attending: Cardiology

## 2016-09-16 ENCOUNTER — Encounter: Payer: Self-pay | Admitting: Physician Assistant

## 2016-09-16 LAB — MYOCARDIAL PERFUSION IMAGING
CHL CUP NUCLEAR SDS: 2
CHL CUP RESTING HR STRESS: 60 {beats}/min
LHR: 0.34
LV dias vol: 90 mL (ref 46–106)
LV sys vol: 42 mL
NUC STRESS TID: 0.92
Peak HR: 90 {beats}/min
SRS: 12
SSS: 12

## 2016-09-16 MED ORDER — TECHNETIUM TC 99M TETROFOSMIN IV KIT
32.4000 | PACK | Freq: Once | INTRAVENOUS | Status: DC | PRN
  Filled 2016-09-16: qty 33

## 2016-09-16 NOTE — Telephone Encounter (Signed)
Pt has been notified of Myoview results by phone with verbal understanding. Pt thanked me for my call today. I will forward a copy of results to PCP as well.

## 2016-09-16 NOTE — Telephone Encounter (Signed)
-----   Message from Liliane Shi, [place] sent at 09/16/2016  4:55 PM EDT ----- Please call the patient. The stress test is ok.  No ischemia.  Continue current medications and follow up as planned.  Please fax a copy of this study result to her PCP:  Unk Pinto, MD  Thanks! Richardson Dopp, PA-C    09/16/2016 4:54 PM

## 2016-09-19 ENCOUNTER — Other Ambulatory Visit: Payer: Self-pay | Admitting: Neurology

## 2016-09-19 DIAGNOSIS — F411 Generalized anxiety disorder: Secondary | ICD-10-CM

## 2016-09-30 ENCOUNTER — Other Ambulatory Visit: Payer: Self-pay | Admitting: Physician Assistant

## 2016-10-09 ENCOUNTER — Other Ambulatory Visit: Payer: Self-pay | Admitting: Neurology

## 2016-10-09 ENCOUNTER — Other Ambulatory Visit: Payer: Self-pay | Admitting: *Deleted

## 2016-10-09 DIAGNOSIS — F411 Generalized anxiety disorder: Secondary | ICD-10-CM

## 2016-10-09 MED ORDER — BUSPIRONE HCL 15 MG PO TABS: 15.0000 mg | ORAL_TABLET | Freq: Two times a day (BID) | ORAL | 0 refills | Status: DC

## 2016-10-13 ENCOUNTER — Encounter: Payer: Self-pay | Admitting: Internal Medicine

## 2016-10-13 ENCOUNTER — Other Ambulatory Visit: Payer: Self-pay | Admitting: *Deleted

## 2016-10-13 ENCOUNTER — Ambulatory Visit (INDEPENDENT_AMBULATORY_CARE_PROVIDER_SITE_OTHER): Payer: BLUE CROSS/BLUE SHIELD | Admitting: Internal Medicine

## 2016-10-13 VITALS — BP 134/88 | HR 60 | Temp 97.3°F | Resp 16 | Ht 61.0 in | Wt 137.8 lb

## 2016-10-13 DIAGNOSIS — R7303 Prediabetes: Secondary | ICD-10-CM

## 2016-10-13 DIAGNOSIS — Z0000 Encounter for general adult medical examination without abnormal findings: Secondary | ICD-10-CM

## 2016-10-13 DIAGNOSIS — Z79899 Other long term (current) drug therapy: Secondary | ICD-10-CM | POA: Diagnosis not present

## 2016-10-13 DIAGNOSIS — Z111 Encounter for screening for respiratory tuberculosis: Secondary | ICD-10-CM | POA: Diagnosis not present

## 2016-10-13 DIAGNOSIS — R5383 Other fatigue: Secondary | ICD-10-CM

## 2016-10-13 DIAGNOSIS — Z23 Encounter for immunization: Secondary | ICD-10-CM | POA: Diagnosis not present

## 2016-10-13 DIAGNOSIS — Z1211 Encounter for screening for malignant neoplasm of colon: Secondary | ICD-10-CM

## 2016-10-13 DIAGNOSIS — Z0001 Encounter for general adult medical examination with abnormal findings: Secondary | ICD-10-CM

## 2016-10-13 DIAGNOSIS — Z1212 Encounter for screening for malignant neoplasm of rectum: Secondary | ICD-10-CM

## 2016-10-13 DIAGNOSIS — Z136 Encounter for screening for cardiovascular disorders: Secondary | ICD-10-CM

## 2016-10-13 DIAGNOSIS — E559 Vitamin D deficiency, unspecified: Secondary | ICD-10-CM

## 2016-10-13 DIAGNOSIS — I10 Essential (primary) hypertension: Secondary | ICD-10-CM | POA: Diagnosis not present

## 2016-10-13 DIAGNOSIS — E782 Mixed hyperlipidemia: Secondary | ICD-10-CM

## 2016-10-13 MED ORDER — ADAPALENE-BENZOYL PEROXIDE 0.1-2.5 % EX GEL: 1.0000 "application " | CUTANEOUS | 0 refills | Status: DC | PRN

## 2016-10-13 NOTE — Progress Notes (Signed)
[facility] Unk Pinto, M.D.     Uvaldo Bristle. Silverio Lay, P.A.-C Liane Comber, [place] [address] [place] [address] Telephone [phone] Telefax [phone] Annual Screening/Preventative Visit & Comprehensive Evaluation &  Examination     This very nice 56 y.o. MWF presents for a Screening/Preventative Visit & comprehensive evaluation and management of multiple medical co-morbidities.  Patient has been followed for HTN, Prediabetes, Hyperlipidemia and Vitamin D Deficiency.      HTN predates since 2014. Patient's BP has been controlled at home and patient denies any cardiac symptoms as chest pain, palpitations, shortness of breath, dizziness or ankle swelling. Today's BP is slightly elevated  At  134/88 and she is asked to monitor BP's 2 x / day.     Patient's hyperlipidemia is controlled with diet and medications. Patient denies myalgias or other medication SE's. Last lipids were  Lab Results  Component Value Date   CHOL 149 08/13/2016   HDL 33 (L) 08/13/2016   LDLCALC 73 08/13/2016   TRIG 215 (H) 08/13/2016   CHOLHDL 4.5 08/13/2016      Patient has prediabetes (A1c 6.0% / 2012) and patient denies reactive hypoglycemic symptoms, visual blurring, diabetic polys, or paresthesias. Last A1c was at goal: Lab Results  Component Value Date   HGBA1C 5.6 08/13/2016      Finally, patient has history of Vitamin D Deficiency ("48" on treatment in 2008) and last Vitamin D was at goal : Lab Results  Component Value Date   VD25OH 65 04/23/2016   Current Outpatient Prescriptions on File Prior to Visit  Medication Sig  . acyclovir ointment (ZOVIRAX) 5 % Apply 1 application topically daily as needed (breakouts).  Marland Kitchen albuterol (PROVENTIL HFA;VENTOLIN HFA) 108 (90 Base) MCG/ACT inhaler Inhale 2 puffs into the lungs every 4 (four) hours as needed for wheezing or shortness of breath.  Marland Kitchen aspirin EC 81 MG  tablet Take 1 tablet (81 mg total) by mouth daily.  Marland Kitchen atorvastatin (LIPITOR) 40 MG tablet Take 40 mg by mouth 3 (three) times a week. On Tuesday, Thursday and Saturday  . busPIRone (BUSPAR) 15 MG tablet Take 1 tablet (15 mg total) by mouth 2 (two) times daily.  . Cholecalciferol (VITAMIN D PO) Take 1,000 Units by mouth daily.   Marland Kitchen estradiol (ESTRACE) 0.5 MG tablet Take 0.5 mg by mouth daily.  . fluticasone (FLONASE) 50 MCG/ACT nasal spray USE 2 SPRAYS IN EACH NOSTRIL DAILY  . furosemide (LASIX) 40 MG tablet TAKE 1 TABLET BY MOUTH EVERY DAY  . losartan (COZAAR) 100 MG tablet Take 1 tablet daily for BP  . medroxyPROGESTERone (PROVERA) 2.5 MG tablet Take 2.5 mg by mouth daily.  . metoprolol succinate (TOPROL-XL) 100 MG 24 hr tablet Take 50-100 mg by mouth as directed. For blood pressure  . montelukast (SINGULAIR) 10 MG tablet Take 10 mg by mouth daily.  . Omega-3 Fatty Acids (FISH OIL PO) Take 1 capsule by mouth daily.  . Red Yeast Rice Extract (RED YEAST RICE PO) Take 1 tablet by mouth daily.  . valACYclovir (VALTREX) 500 MG tablet Take 500 mg by mouth 2 (two) times daily as needed (fever blister outbreak).   No current facility-administered medications on file prior to visit.    No Known Allergies   Past Medical History:  Diagnosis Date  . Allergy   . Asthma   . Cardiomyopathy ([place])    a. Echo 8/16:  EF 40-45%, apical and ant-septal HK, Gr 2  DD  . Diverticula, colon   . History of cardiovascular stress test    Myoview 8/16:  EF 48%, anterior and apical defect c/w breast atten, no ischemia; Low Risk // Nuclear stress test 9/18: Low risk stress nuclear study with fixed septal defect consistent with LBBB vs prior infarct; no ischemia; EF 53 with septal akinesis.  . Hypertension   . Irregular periods/menstrual cycles   . LBBB (left bundle branch block)   . Seasonal allergies   . TIA (transient ischemic attack)   . Torn meniscus    Health Maintenance  Topic Date Due  . MAMMOGRAM   04/26/2016  . INFLUENZA VACCINE  08/12/2016  . PAP SMEAR  05/19/2019  . COLONOSCOPY  08/24/2022  . TETANUS/TDAP  01/08/2025  . Hepatitis C Screening  Completed  . HIV Screening  Completed   Immunization History  Administered Date(s) Administered  . Influenza Inj Mdck Quad With Preservative 10/13/2016  . Influenza Split 10/24/2013  . Influenza,inj,quad, With Preservative 12/24/2015  . PPD Test 06/13/2013, 09/13/2015, 10/13/2016  . Pneumococcal Polysaccharide-23 02/12/2004  . Td 10/12/2004  . Tdap 01/09/2015   Past Surgical History:  Procedure Laterality Date  . BREAST REDUCTION SURGERY    . CARDIAC CATHETERIZATION    . CESAREAN SECTION     x2  . KNEE SURGERY     partial knee replacement right  . REPAIR QUADRICEPS / HAMSTRING MUSCLE    . TONSILLECTOMY    . tubes in ears  10/12   Family History  Problem Relation Age of Onset  . Diabetes Mother   . Heart disease Mother   . Hypertension Mother   . Heart attack Mother 12  . Other Father        MVA  . Asthma Sister   . Asthma Son   . Cancer Maternal Grandmother        Uterine  . Heart attack Paternal Grandmother   . Colon cancer Neg Hx    Social History  Substance Use Topics  . Smoking status: Never Smoker  . Smokeless tobacco: Never Used  . Alcohol use Yes     Comment: 1-2 glasses wine nightly    ROS Constitutional: Denies fever, chills, weight loss/gain, headaches, insomnia,  night sweats, and change in appetite. Does c/o fatigue. Eyes: Denies redness, blurred vision, diplopia, discharge, itchy, watery eyes.  ENT: Denies discharge, congestion, post nasal drip, epistaxis, sore throat, earache, hearing loss, dental pain, Tinnitus, Vertigo, Sinus pain, snoring.  Cardio: Denies chest pain, palpitations, irregular heartbeat, syncope, dyspnea, diaphoresis, orthopnea, PND, claudication, edema Respiratory: denies cough, dyspnea, DOE, pleurisy, hoarseness, laryngitis, wheezing.  Gastrointestinal: Denies dysphagia,  heartburn, reflux, water brash, pain, cramps, nausea, vomiting, bloating, diarrhea, constipation, hematemesis, melena, hematochezia, jaundice, hemorrhoids Genitourinary: Denies dysuria, frequency, urgency, nocturia, hesitancy, discharge, hematuria, flank pain Breast: Breast lumps, nipple discharge, bleeding.  Musculoskeletal: Denies arthralgia, myalgia, stiffness, Jt. Swelling, pain, limp, and strain/sprain. Denies falls. Skin: Denies puritis, rash, hives, warts, acne, eczema, changing in skin lesion Neuro: No weakness, tremor, incoordination, spasms, paresthesia, pain Psychiatric: Denies confusion, memory loss, sensory loss. Denies Depression. Endocrine: Denies change in weight, skin, hair change, nocturia, and paresthesia, diabetic polys, visual blurring, hyper / hypo glycemic episodes.  Heme/Lymph: No excessive bleeding, bruising, enlarged lymph nodes.  Physical Exam  BP 134/88   Pulse 60   Temp (!) 97.3 F (36.3 C)   Resp 16   Ht 5\' 1"  (1.549 m)   Wt 137 lb 12.8 oz (62.5 kg)   BMI 26.04 kg/m  General Appearance: Well nourished, well groomed and in no apparent distress.  Eyes: PERRLA, EOMs, conjunctiva no swelling or erythema, normal fundi and vessels. Sinuses: No frontal/maxillary tenderness ENT/Mouth: EACs patent / TMs  nl. Nares clear without erythema, swelling, mucoid exudates. Oral hygiene is good. No erythema, swelling, or exudate. Tongue normal, non-obstructing. Tonsils not swollen or erythematous. Hearing normal.  Neck: Supple, thyroid normal. No bruits, nodes or JVD. Respiratory: Respiratory effort normal.  BS equal and clear bilateral without rales, rhonci, wheezing or stridor. Cardio: Heart sounds are normal with regular rate and rhythm and no murmurs, rubs or gallops. Peripheral pulses are normal and equal bilaterally without edema. No aortic or femoral bruits. Chest: symmetric with normal excursions and percussion. Breasts: Deferred to Gyn Abdomen: Flat, soft with  bowel sounds active. Nontender, no guarding, rebound, hernias, masses, or organomegaly.  Lymphatics: Non tender without lymphadenopathy.  Skin: Warm and dry without rashes, lesions, cyanosis, clubbing or  ecchymosis.  Neuro: Cranial nerves intact, reflexes equal bilaterally. Normal muscle tone, no cerebellar symptoms. Sensation intact.  Pysch: Alert and oriented X 3, normal affect, Insight and Judgment appropriate.   Assessment and Plan  1. Annual Preventative Screening Examination  2. Essential hypertension  - [place], RETROPERITNL ABD,  LTD - Urinalysis, Routine w reflex microscopic - Microalbumin / creatinine urine ratio - CBC with Differential/Platelet - BASIC METABOLIC PANEL WITH GFR - Magnesium - TSH  3. Hyperlipidemia, mixed  - [place], RETROPERITNL ABD,  LTD - Hepatic function panel - Lipid panel - TSH  4. Prediabetes  - [place], RETROPERITNL ABD,  LTD - Hemoglobin A1c - Insulin, random  5. Vitamin D deficiency  - VITAMIN D 25 Hydroxy    6. Screening for ischemic heart disease  - Lipid panel  7. Screening for AAA (aortic abdominal aneurysm)  - [place], RETROPERITNL ABD,  LTD  8. Screening for colorectal cancer  - POC Hemoccult Bld/Stl (3-Cd Home Screen); Future  9. Screening examination for pulmonary tuberculosis  - PPD  10. Fatigue, unspecified type  - Iron,Total/Total Iron Binding Cap - Vitamin B12 - CBC with Differential/Platelet - TSH  11. Medication management  - Urinalysis, Routine w reflex microscopic - Microalbumin / creatinine urine ratio - CBC with Differential/Platelet - BASIC METABOLIC PANEL WITH GFR - Hepatic function panel - Magnesium - Lipid panel - TSH - Hemoglobin A1c - Insulin, random - VITAMIN D 25 Hydroxy   12. Need for immunization against influenza  - FLU VACCINE MDCK QUAD W/Preservative       Patient was counseled in prudent diet to achieve/maintain BMI less than 25 for weight control, BP monitoring, regular exercise and  medications. Discussed med's effects and SE's. Screening labs and tests as requested with regular follow-up as recommended. Over 40 minutes of exam, counseling, chart review and high complex critical decision making was performed.

## 2016-10-13 NOTE — Patient Instructions (Signed)
Preventive Care for Adults  A healthy lifestyle and preventive care can promote health and wellness. Preventive health guidelines for women include the following key practices.  A routine yearly physical is a good way to check with your health care provider about your health and preventive screening. It is a chance to share any concerns and updates on your health and to receive a thorough exam.  Visit your dentist for a routine exam and preventive care every 6 months. Brush your teeth twice a day and floss once a day. Good oral hygiene prevents tooth decay and gum disease.  The frequency of eye exams is based on your age, health, family medical history, use of contact lenses, and other factors. Follow your health care provider's recommendations for frequency of eye exams.  Eat a healthy diet. Foods like vegetables, fruits, whole grains, low-fat dairy products, and lean protein foods contain the nutrients you need without too many calories. Decrease your intake of foods high in solid fats, added sugars, and salt. Eat the right amount of calories for you.Get information about a proper diet from your health care provider, if necessary.  Regular physical exercise is one of the most important things you can do for your health. Most adults should get at least 150 minutes of moderate-intensity exercise (any activity that increases your heart rate and causes you to sweat) each week. In addition, most adults need muscle-strengthening exercises on 2 or more days a week.  Maintain a healthy weight. The body mass index (BMI) is a screening tool to identify possible weight problems. It provides an estimate of body fat based on height and weight. Your health care provider can find your BMI and can help you achieve or maintain a healthy weight.For adults 20 years and older:  A BMI below 18.5 is considered underweight.  A BMI of 18.5 to 24.9 is normal.  A BMI of 25 to 29.9 is considered overweight.  A BMI of  30 and above is considered obese.  Maintain normal blood lipids and cholesterol levels by exercising and minimizing your intake of saturated fat. Eat a balanced diet with plenty of fruit and vegetables. Blood tests for lipids and cholesterol should begin at age 2 and be repeated every 5 years. If your lipid or cholesterol levels are high, you are over 50, or you are at high risk for heart disease, you may need your cholesterol levels checked more frequently.Ongoing high lipid and cholesterol levels should be treated with medicines if diet and exercise are not working.  If you smoke, find out from your health care provider how to quit. If you do not use tobacco, do not start.  Lung cancer screening is recommended for adults aged 51-80 years who are at high risk for developing lung cancer because of a history of smoking. A yearly low-dose CT scan of the lungs is recommended for people who have at least a 30-pack-year history of smoking and are a current smoker or have quit within the past 15 years. A pack year of smoking is smoking an average of 1 pack of cigarettes a day for 1 year (for example: 1 pack a day for 30 years or 2 packs a day for 15 years). Yearly screening should continue until the smoker has stopped smoking for at least 15 years. Yearly screening should be stopped for people who develop a health problem that would prevent them from having lung cancer treatment.  High blood pressure causes heart disease and increases the risk of  stroke. Your blood pressure should be checked at least every 1 to 2 years. Ongoing high blood pressure should be treated with medicines if weight loss and exercise do not work.  If you are 55-79 years old, ask your health care provider if you should take aspirin to prevent strokes.  Diabetes screening involves taking a blood sample to check your fasting blood sugar level. This should be done once every 3 years, after age 45, if you are within normal weight and  without risk factors for diabetes. Testing should be considered at a younger age or be carried out more frequently if you are overweight and have at least 1 risk factor for diabetes.  Breast cancer screening is essential preventive care for women. You should practice "breast self-awareness." This means understanding the normal appearance and feel of your breasts and may include breast self-examination. Any changes detected, no matter how small, should be reported to a health care provider. Women in their 20s and 30s should have a clinical breast exam (CBE) by a health care provider as part of a regular health exam every 1 to 3 years. After age 40, women should have a CBE every year. Starting at age 40, women should consider having a mammogram (breast X-ray test) every year. Women who have a family history of breast cancer should talk to their health care provider about genetic screening. Women at a high risk of breast cancer should talk to their health care providers about having an MRI and a mammogram every year.  Breast cancer gene (BRCA)-related cancer risk assessment is recommended for women who have family members with BRCA-related cancers. BRCA-related cancers include breast, ovarian, tubal, and peritoneal cancers. Having family members with these cancers may be associated with an increased risk for harmful changes (mutations) in the breast cancer genes BRCA1 and BRCA2. Results of the assessment will determine the need for genetic counseling and BRCA1 and BRCA2 testing.  Routine pelvic exams to screen for cancer are no longer recommended for nonpregnant women who are considered low risk for cancer of the pelvic organs (ovaries, uterus, and vagina) and who do not have symptoms. Ask your health care provider if a screening pelvic exam is right for you.  If you have had past treatment for cervical cancer or a condition that could lead to cancer, you need Pap tests and screening for cancer for at least 20  years after your treatment. If Pap tests have been discontinued, your risk factors (such as having a new sexual partner) need to be reassessed to determine if screening should be resumed. Some women have medical problems that increase the chance of getting cervical cancer. In these cases, your health care provider may recommend more frequent screening and Pap tests.  Colorectal cancer can be detected and often prevented. Most routine colorectal cancer screening begins at the age of 50 years and continues through age 75 years. However, your health care provider may recommend screening at an earlier age if you have risk factors for colon cancer. On a yearly basis, your health care provider may provide home test kits to check for hidden blood in the stool. Use of a small camera at the end of a tube, to directly examine the colon (sigmoidoscopy or colonoscopy), can detect the earliest forms of colorectal cancer. Talk to your health care provider about this at age 50, when routine screening begins. Direct exam of the colon should be repeated every 5-10 years through age 75 years, unless early forms of pre-cancerous   polyps or small growths are found.  Hepatitis C blood testing is recommended for all people born from 1945 through 1965 and any individual with known risks for hepatitis C.  Pra  Osteoporosis is a disease in which the bones lose minerals and strength with aging. This can result in serious bone fractures or breaks. The risk of osteoporosis can be identified using a bone density scan. Women ages 65 years and over and women at risk for fractures or osteoporosis should discuss screening with their health care providers. Ask your health care provider whether you should take a calcium supplement or vitamin D to reduce the rate of osteoporosis.  Menopause can be associated with physical symptoms and risks. Hormone replacement therapy is available to decrease symptoms and risks. You should talk to your  health care provider about whether hormone replacement therapy is right for you.  Use sunscreen. Apply sunscreen liberally and repeatedly throughout the day. You should seek shade when your shadow is shorter than you. Protect yourself by wearing long sleeves, pants, a wide-brimmed hat, and sunglasses year round, whenever you are outdoors.  Once a month, do a whole body skin exam, using a mirror to look at the skin on your back. Tell your health care provider of new moles, moles that have irregular borders, moles that are larger than a pencil eraser, or moles that have changed in shape or color.  Stay current with required vaccines (immunizations).  Influenza vaccine. All adults should be immunized every year.  Tetanus, diphtheria, and acellular pertussis (Td, Tdap) vaccine. Pregnant women should receive 1 dose of Tdap vaccine during each pregnancy. The dose should be obtained regardless of the length of time since the last dose. Immunization is preferred during the 27th-36th week of gestation. An adult who has not previously received Tdap or who does not know her vaccine status should receive 1 dose of Tdap. This initial dose should be followed by tetanus and diphtheria toxoids (Td) booster doses every 10 years. Adults with an unknown or incomplete history of completing a 3-dose immunization series with Td-containing vaccines should begin or complete a primary immunization series including a Tdap dose. Adults should receive a Td booster every 10 years.  Varicella vaccine. An adult without evidence of immunity to varicella should receive 2 doses or a second dose if she has previously received 1 dose. Pregnant females who do not have evidence of immunity should receive the first dose after pregnancy. This first dose should be obtained before leaving the health care facility. The second dose should be obtained 4-8 weeks after the first dose.  Human papillomavirus (HPV) vaccine. Females aged 13-26 years  who have not received the vaccine previously should obtain the 3-dose series. The vaccine is not recommended for use in pregnant females. However, pregnancy testing is not needed before receiving a dose. If a female is found to be pregnant after receiving a dose, no treatment is needed. In that case, the remaining doses should be delayed until after the pregnancy. Immunization is recommended for any person with an immunocompromised condition through the age of 26 years if she did not get any or all doses earlier. During the 3-dose series, the second dose should be obtained 4-8 weeks after the first dose. The third dose should be obtained 24 weeks after the first dose and 16 weeks after the second dose.  Zoster vaccine. One dose is recommended for adults aged 60 years or older unless certain conditions are present.  Measles, mumps, and rubella (  MMR) vaccine. Adults born before [birth year] generally are considered immune to measles and mumps. Adults born in [birth year] or later should have 1 or more doses of MMR vaccine unless there is a contraindication to the vaccine or there is laboratory evidence of immunity to each of the three diseases. A routine second dose of MMR vaccine should be obtained at least 28 days after the first dose for students attending postsecondary schools, health care workers, or international travelers. People who received inactivated measles vaccine or an unknown type of measles vaccine during 1963-1967 should receive 2 doses of MMR vaccine. People who received inactivated mumps vaccine or an unknown type of mumps vaccine before 1979 and are at high risk for mumps infection should consider immunization with 2 doses of MMR vaccine. For females of childbearing age, rubella immunity should be determined. If there is no evidence of immunity, females who are not pregnant should be vaccinated. If there is no evidence of immunity, females who are pregnant should delay immunization until after pregnancy.  Unvaccinated health care workers born before [birth year] who lack laboratory evidence of measles, mumps, or rubella immunity or laboratory confirmation of disease should consider measles and mumps immunization with 2 doses of MMR vaccine or rubella immunization with 1 dose of MMR vaccine.  Pneumococcal 13-valent conjugate (PCV13) vaccine. When indicated, a person who is uncertain of her immunization history and has no record of immunization should receive the PCV13 vaccine. An adult aged 39 years or older who has certain medical conditions and has not been previously immunized should receive 1 dose of PCV13 vaccine. This PCV13 should be followed with a dose of pneumococcal polysaccharide (PPSV23) vaccine. The PPSV23 vaccine dose should be obtained at least 8 weeks after the dose of PCV13 vaccine. An adult aged 62 years or older who has certain medical conditions and previously received 1 or more doses of PPSV23 vaccine should receive 1 dose of PCV13. The PCV13 vaccine dose should be obtained 1 or more years after the last PPSV23 vaccine dose.    Pneumococcal polysaccharide (PPSV23) vaccine. When PCV13 is also indicated, PCV13 should be obtained first. All adults aged 67 years and older should be immunized. An adult younger than age 45 years who has certain medical conditions should be immunized. Any person who resides in a nursing home or long-term care facility should be immunized. An adult smoker should be immunized. People with an immunocompromised condition and certain other conditions should receive both PCV13 and PPSV23 vaccines. People with human immunodeficiency virus (HIV) infection should be immunized as soon as possible after diagnosis. Immunization during chemotherapy or radiation therapy should be avoided. Routine use of PPSV23 vaccine is not recommended for American Indians, [place] Natives, or people younger than 65 years unless there are medical conditions that require PPSV23 vaccine. When indicated,  people who have unknown immunization and have no record of immunization should receive PPSV23 vaccine. One-time revaccination 5 years after the first dose of PPSV23 is recommended for people aged 19-64 years who have chronic kidney failure, nephrotic syndrome, asplenia, or immunocompromised conditions. People who received 1-2 doses of PPSV23 before age 23 years should receive another dose of PPSV23 vaccine at age 35 years or later if at least 5 years have passed since the previous dose. Doses of PPSV23 are not needed for people immunized with PPSV23 at or after age 38 years.  Preventive Services / Frequency   Ages 43 to 86 years  Blood pressure check.  Lipid and cholesterol check.  Lung  cancer screening. / Every year if you are aged 30-80 years and have a 30-pack-year history of smoking and currently smoke or have quit within the past 15 years. Yearly screening is stopped once you have quit smoking for at least 15 years or develop a health problem that would prevent you from having lung cancer treatment.  Clinical breast exam.** / Every year after age 44 years.  BRCA-related cancer risk assessment.** / For women who have family members with a BRCA-related cancer (breast, ovarian, tubal, or peritoneal cancers).  Mammogram.** / Every year beginning at age 32 years and continuing for as long as you are in good health. Consult with your health care provider.  Pap test.** / Every 3 years starting at age 46 years through age 42 or 54 years with a history of 3 consecutive normal Pap tests.  HPV screening.** / Every 3 years from ages 33 years through ages 61 to 32 years with a history of 3 consecutive normal Pap tests.  Fecal occult blood test (FOBT) of stool. / Every year beginning at age 21 years and continuing until age 54 years. You may not need to do this test if you get a colonoscopy every 10 years.  Flexible sigmoidoscopy or colonoscopy.** / Every 5 years for a flexible sigmoidoscopy or  every 10 years for a colonoscopy beginning at age 40 years and continuing until age 35 years.  Hepatitis C blood test.** / For all people born from [birth year] through 1965 and any individual with known risks for hepatitis C.  Skin self-exam. / Monthly.  Influenza vaccine. / Every year.  Tetanus, diphtheria, and acellular pertussis (Tdap/Td) vaccine.** / Consult your health care provider. Pregnant women should receive 1 dose of Tdap vaccine during each pregnancy. 1 dose of Td every 10 years.  Varicella vaccine.** / Consult your health care provider. Pregnant females who do not have evidence of immunity should receive the first dose after pregnancy.  Zoster vaccine.** / 1 dose for adults aged 90 years or older.  Pneumococcal 13-valent conjugate (PCV13) vaccine.** / Consult your health care provider.  Pneumococcal polysaccharide (PPSV23) vaccine.** / 1 to 2 doses if you smoke cigarettes or if you have certain conditions.  Meningococcal vaccine.** / Consult your health care provider.  Hepatitis A vaccine.** / Consult your health care provider.  Hepatitis B vaccine.** / Consult your health care provider. Screening for abdominal aortic aneurysm (AAA)  by ultrasound is recommended for people over 50 who have history of high blood pressure or who are current or former smokers. ++++++++++++++++++ Recommend Adult Low Dose Aspirin or  coated  Aspirin 81 mg daily  To reduce risk of Colon Cancer 20 %,  Skin Cancer 26 % ,  Melanoma 46%  and  Pancreatic cancer 60% +++++++++++++++++++ Vitamin D goal  is between 70-100.  Please make sure that you are taking your Vitamin D as directed.  It is very important as a natural anti-inflammatory  helping hair, skin, and nails, as well as reducing stroke and heart attack risk.  It helps your bones and helps with mood. It also decreases numerous cancer risks so please take it as directed.  Low Vit D is associated with a 200-300% higher risk for CANCER  and  200-300% higher risk for HEART   ATTACK  &  STROKE.   .....................................Marland Kitchen It is also associated with higher death rate at younger ages,  autoimmune diseases like Rheumatoid arthritis, Lupus, Multiple Sclerosis.    Also many other serious conditions, like depression,  Alzheimer's Dementia, infertility, muscle aches, fatigue, fibromyalgia - just to name a few. ++++++++++++++++++ Recommend the book "The END of DIETING" by Dr Excell Seltzer  & the book "The END of DIABETES " by Dr Excell Seltzer At [email or web address] - get book & Audio CD's    Being diabetic has a  300% increased risk for heart attack, stroke, cancer, and alzheimer- type vascular dementia. It is very important that you work harder with diet by avoiding all foods that are white. Avoid white rice (brown & wild rice is OK), white potatoes (sweetpotatoes in moderation is OK), White bread or wheat bread or anything made out of white flour like bagels, donuts, rolls, buns, biscuits, cakes, pastries, cookies, pizza crust, and pasta (made from white flour & egg whites) - vegetarian pasta or spinach or wheat pasta is OK. Multigrain breads like Arnold's or [employer], or multigrain sandwich thins or flatbreads.  Diet, exercise and weight loss can reverse and cure diabetes in the early stages.  Diet, exercise and weight loss is very important in the control and prevention of complications of diabetes which affects every system in your body, ie. Brain - dementia/stroke, eyes - glaucoma/blindness, heart - heart attack/heart failure, kidneys - dialysis, stomach - gastric paralysis, intestines - malabsorption, nerves - severe painful neuritis, circulation - gangrene & loss of a leg(s), and finally cancer and Alzheimers.    I recommend avoid fried & greasy foods,  sweets/candy, white rice (brown or wild rice or Quinoa is OK), white potatoes (sweet potatoes are OK) - anything made from white flour - bagels, doughnuts, rolls, buns, biscuits,white  and wheat breads, pizza crust and traditional pasta made of white flour & egg white(vegetarian pasta or spinach or wheat pasta is OK).  Multi-grain bread is OK - like multi-grain flat bread or sandwich thins. Avoid alcohol in excess. Exercise is also important.    Eat all the vegetables you want - avoid meat, especially red meat and dairy - especially cheese.  Cheese is the most concentrated form of trans-fats which is the worst thing to clog up our arteries. Veggie cheese is OK which can be found in the fresh produce section at [employer] or [employer] or [employer]  ++++++++++++++++++++++ DASH Eating Plan  DASH stands for "Dietary Approaches to Stop Hypertension."   The DASH eating plan is a healthy eating plan that has been shown to reduce high blood pressure (hypertension). Additional health benefits may include reducing the risk of type 2 diabetes mellitus, heart disease, and stroke. The DASH eating plan may also help with weight loss. WHAT DO I NEED TO KNOW ABOUT THE DASH EATING PLAN? For the DASH eating plan, you will follow these general guidelines:  Choose foods with a percent daily value for sodium of less than 5% (as listed on the food label).  Use salt-free seasonings or herbs instead of table salt or sea salt.  Check with your health care provider or pharmacist before using salt substitutes.  Eat lower-sodium products, often labeled as "lower sodium" or "no salt added."  Eat fresh foods.  Eat more vegetables, fruits, and low-fat dairy products.  Choose whole grains. Look for the word "whole" as the first word in the ingredient list.  Choose fish   Limit sweets, desserts, sugars, and sugary drinks.  Choose heart-healthy fats.  Eat veggie cheese   Eat more home-cooked food and less restaurant, buffet, and fast food.  Limit fried foods.  Cook foods using methods other than frying.  Limit canned  vegetables. If you do use them, rinse them well to decrease the  sodium.  When eating at a restaurant, ask that your food be prepared with less salt, or no salt if possible.                      WHAT FOODS CAN I EAT? Read Dr Fara Olden Fuhrman's books on The End of Dieting & The End of Diabetes  Grains Whole grain or whole wheat bread. Brown rice. Whole grain or whole wheat pasta. Quinoa, bulgur, and whole grain cereals. Low-sodium cereals. Corn or whole wheat flour tortillas. Whole grain cornbread. Whole grain crackers. Low-sodium crackers.  Vegetables Fresh or frozen vegetables (raw, steamed, roasted, or grilled). Low-sodium or reduced-sodium tomato and vegetable juices. Low-sodium or reduced-sodium tomato sauce and paste. Low-sodium or reduced-sodium canned vegetables.   Fruits All fresh, canned (in natural juice), or frozen fruits.  Protein Products  All fish and seafood.  Dried beans, peas, or lentils. Unsalted nuts and seeds. Unsalted canned beans.  Dairy Low-fat dairy products, such as skim or 1% milk, 2% or reduced-fat cheeses, low-fat ricotta or cottage cheese, or plain low-fat yogurt. Low-sodium or reduced-sodium cheeses.  Fats and Oils Tub margarines without trans fats. Light or reduced-fat mayonnaise and salad dressings (reduced sodium). Avocado. Safflower, olive, or canola oils. Natural peanut or almond butter.  Other Unsalted popcorn and pretzels. The items listed above may not be a complete list of recommended foods or beverages. Contact your dietitian for more options.  ++++++++++++++++++  WHAT FOODS ARE NOT RECOMMENDED? Grains/ White flour or wheat flour White bread. White pasta. White rice. Refined cornbread. Bagels and croissants. Crackers that contain trans fat.  Vegetables  Creamed or fried vegetables. Vegetables in a . Regular canned vegetables. Regular canned tomato sauce and paste. Regular tomato and vegetable juices.  Fruits Dried fruits. Canned fruit in light or heavy syrup. Fruit juice.  Meat and Other Protein  Products Meat in general - RED meat & White meat.  Fatty cuts of meat. Ribs, chicken wings, all processed meats as bacon, sausage, bologna, salami, fatback, hot dogs, bratwurst and packaged luncheon meats.  Dairy Whole or 2% milk, cream, half-and-half, and cream cheese. Whole-fat or sweetened yogurt. Full-fat cheeses or blue cheese. Non-dairy creamers and whipped toppings. Processed cheese, cheese spreads, or cheese curds.  Condiments Onion and garlic salt, seasoned salt, table salt, and sea salt. Canned and packaged gravies. Worcestershire sauce. Tartar sauce. Barbecue sauce. Teriyaki sauce. Soy sauce, including reduced sodium. Steak sauce. Fish sauce. Oyster sauce. Cocktail sauce. Horseradish. Ketchup and mustard. Meat flavorings and tenderizers. Bouillon cubes. Hot sauce. Tabasco sauce. Marinades. Taco seasonings. Relishes.  Fats and Oils Butter, stick margarine, lard, shortening and bacon fat. Coconut, palm kernel, or palm oils. Regular salad dressings.  Pickles and olives. Salted popcorn and pretzels.  The items listed above may not be a complete list of foods and beverages to avoid.

## 2016-10-14 ENCOUNTER — Other Ambulatory Visit: Payer: Self-pay | Admitting: Physician Assistant

## 2016-10-14 LAB — CBC WITH DIFFERENTIAL/PLATELET
BASOS ABS: 79 {cells}/uL (ref 0–200)
Basophils Relative: 1 %
EOS ABS: 158 {cells}/uL (ref 15–500)
Eosinophils Relative: 2 %
HCT: 45.2 % — ABNORMAL HIGH (ref 35.0–45.0)
Hemoglobin: 15.1 g/dL (ref 11.7–15.5)
Lymphs Abs: 1888 cells/uL (ref 850–3900)
MCH: 30.1 pg (ref 27.0–33.0)
MCHC: 33.4 g/dL (ref 32.0–36.0)
MCV: 90 fL (ref 80.0–100.0)
MPV: 12.9 fL — ABNORMAL HIGH (ref 7.5–12.5)
Monocytes Relative: 7.2 %
NEUTROS PCT: 65.9 %
Neutro Abs: 5206 cells/uL (ref 1500–7800)
PLATELETS: 296 10*3/uL (ref 140–400)
RBC: 5.02 10*6/uL (ref 3.80–5.10)
RDW: 13.2 % (ref 11.0–15.0)
TOTAL LYMPHOCYTE: 23.9 %
WBC: 7.9 10*3/uL (ref 3.8–10.8)
WBCMIX: 569 {cells}/uL (ref 200–950)

## 2016-10-14 LAB — INSULIN, RANDOM: Insulin: 5.6 u[IU]/mL (ref 2.0–19.6)

## 2016-10-14 LAB — MICROALBUMIN / CREATININE URINE RATIO
CREATININE, URINE: 33 mg/dL (ref 20–275)
Microalb Creat Ratio: 15 mcg/mg creat (ref <30)
Microalb, Ur: 0.5 mg/dL

## 2016-10-14 LAB — BASIC METABOLIC PANEL WITH GFR
BUN: 12 mg/dL (ref 7–25)
CHLORIDE: 104 mmol/L (ref 98–110)
CO2: 27 mmol/L (ref 20–32)
Calcium: 9.2 mg/dL (ref 8.6–10.4)
Creat: 0.89 mg/dL (ref 0.50–1.05)
GFR, EST AFRICAN AMERICAN: 85 mL/min/{1.73_m2} (ref >=60)
GFR, Est Non African American: 73 mL/min/{1.73_m2} (ref >=60)
Glucose, Bld: 110 mg/dL — ABNORMAL HIGH (ref 65–99)
POTASSIUM: 4.3 mmol/L (ref 3.5–5.3)
Sodium: 142 mmol/L (ref 135–146)

## 2016-10-14 LAB — URINALYSIS, ROUTINE W REFLEX MICROSCOPIC
Bilirubin Urine: NEGATIVE
Glucose, UA: NEGATIVE
HGB URINE DIPSTICK: NEGATIVE
Ketones, ur: NEGATIVE
LEUKOCYTES UA: NEGATIVE
NITRITE: NEGATIVE
PH: 6.5 (ref 5.0–8.0)
Protein, ur: NEGATIVE
SPECIFIC GRAVITY, URINE: 1.006 (ref 1.001–1.03)

## 2016-10-14 LAB — HEMOGLOBIN A1C
EAG (MMOL/L): 6.2 (calc)
HEMOGLOBIN A1C: 5.5 %{Hb} (ref <5.7)
Mean Plasma Glucose: 111 (calc)

## 2016-10-14 LAB — IRON, TOTAL/TOTAL IRON BINDING CAP
%SAT: 33 % (calc) (ref 11–50)
IRON: 95 ug/dL (ref 45–160)
TIBC: 288 mcg/dL (calc) (ref 250–450)

## 2016-10-14 LAB — HEPATIC FUNCTION PANEL
AG Ratio: 1.7 (calc) (ref 1.0–2.5)
ALT: 22 U/L (ref 6–29)
AST: 19 U/L (ref 10–35)
Albumin: 4.3 g/dL (ref 3.6–5.1)
Alkaline phosphatase (APISO): 95 U/L (ref 33–130)
BILIRUBIN DIRECT: 0.1 mg/dL (ref 0.0–0.2)
BILIRUBIN INDIRECT: 0.5 mg/dL (ref 0.2–1.2)
GLOBULIN: 2.5 g/dL (ref 1.9–3.7)
Total Bilirubin: 0.6 mg/dL (ref 0.2–1.2)
Total Protein: 6.8 g/dL (ref 6.1–8.1)

## 2016-10-14 LAB — LIPID PANEL
CHOL/HDL RATIO: 3.9 (calc) (ref <5.0)
Cholesterol: 143 mg/dL (ref <200)
HDL: 37 mg/dL — ABNORMAL LOW (ref >50)
LDL CHOLESTEROL (CALC): 79 mg/dL
NON-HDL CHOLESTEROL (CALC): 106 mg/dL (ref <130)
Triglycerides: 175 mg/dL — ABNORMAL HIGH (ref <150)

## 2016-10-14 LAB — MAGNESIUM: Magnesium: 2.2 mg/dL (ref 1.5–2.5)

## 2016-10-14 LAB — VITAMIN B12: VITAMIN B 12: 396 pg/mL (ref 200–1100)

## 2016-10-14 LAB — VITAMIN D 25 HYDROXY (VIT D DEFICIENCY, FRACTURES): Vit D, 25-Hydroxy: 73 ng/mL (ref 30–100)

## 2016-10-14 LAB — TSH: TSH: 1.21 m[IU]/L

## 2016-10-15 LAB — TB SKIN TEST
INDURATION: 0 mm
TB SKIN TEST: NEGATIVE

## 2016-11-29 ENCOUNTER — Other Ambulatory Visit: Payer: Self-pay | Admitting: Internal Medicine

## 2016-11-29 DIAGNOSIS — I10 Essential (primary) hypertension: Secondary | ICD-10-CM

## 2016-12-28 ENCOUNTER — Other Ambulatory Visit: Payer: Self-pay

## 2016-12-28 MED ORDER — BUSPIRONE HCL 15 MG PO TABS: 15.0000 mg | ORAL_TABLET | Freq: Two times a day (BID) | ORAL | 0 refills | Status: DC

## 2016-12-31 ENCOUNTER — Other Ambulatory Visit: Payer: Self-pay

## 2016-12-31 MED ORDER — FUROSEMIDE 40 MG PO TABS: 40.0000 mg | ORAL_TABLET | Freq: Every day | ORAL | 0 refills | Status: DC

## 2017-01-01 ENCOUNTER — Other Ambulatory Visit: Payer: Self-pay | Admitting: Internal Medicine

## 2017-01-01 MED ORDER — FUROSEMIDE 40 MG PO TABS: ORAL_TABLET | ORAL | 1 refills | Status: DC

## 2017-01-17 NOTE — Progress Notes (Signed)
FOLLOW UP  Assessment and Plan:   CHF Currently well managed Disease process and medications discussed. Questions answered fully. Emphasized salt restriction, less than 2000mg  a day, wear compression hose daily when extremities will be dependent Encouraged daily monitoring of the patient's weight, call office if 5 lb weight loss or gain in a day.  Encouraged regular exercise. If any increasing shortness of breath, swelling, or chest pressure go to ER immediately.  decrease your fluid intake to less than 2 L daily please remember to always increase your potassium intake with any increase of your fluid pill.   Hypertension Well controlled with current medications  Monitor blood pressure at home; patient to call if consistently greater than 130/80 Continue DASH diet.   Reminder to go to the ER if any CP, SOB, nausea, dizziness, severe HA, changes vision/speech, left arm numbness and tingling and jaw pain.  Cholesterol Currently at LDL goal; continue red rice yeast Continue low cholesterol diet and exercise.  Check lipid panel.   Other abnormal glucose Last 2 A1Cs at goal Continue diet and exercise.  Perform daily foot/skin check, notify office of any concerning changes.  Check A1C  Overweight Long discussion about weight loss, diet, and exercise Recommended diet heavy in fruits and veggies and low in animal meats, cheeses, and dairy products, appropriate calorie intake Discussed appropriate weight for height  Follow up 3 months  Vitamin D Def At goal at last visit; continue supplementation to maintain goal of 70-100 Defer Vit D level  Anxiety Continues to have anxiety with buspar only; discussed adding daily SSRI today Lexapro 10 mg daily initiated; discussed may experience some  Stress management techniques discussed, increase water, good sleep hygiene discussed, increase exercise, and increase veggies.   Numbness/tingling of BUE Primarily at night/in AM, no neck  pain Cervical XR to r/o narrowing Advised good neck support, NSAIDS Discussed PT/OT if XR negative  Continue diet and meds as discussed. Further disposition pending results of labs. Discussed med's effects and SE's.   Over 30 minutes of exam, counseling, chart review, and critical decision making was performed.   Future Appointments  Date Time Provider [place]  04/26/2017 10:30 AM Unk Pinto, MD [facility] None  11/05/2017  9:00 AM Unk Pinto, MD [facility] None    ----------------------------------------------------------------------------------------------------------------------  HPI 57 y.o. female with hx of cardiomyopathy (Myoview 2016; EF 48%, anterior and apical defts, LBBB vs prior infarct with septal akinesis, no ischemia), TIA,  presents for 3 month follow up on systolic CHF, hypertension, cholesterol, glucose monitoring with hx of prediabetes, weight, GAD and vitamin D deficiency. She also reports intermittent numbness/tingling of bilateral upper extremities from elbows down on lateral aspect that is worse when sleeping/AM. She denies neck pain, injury, weakness of upper extremities/dropping items. She has not tried anything for this.   she has a diagnosis of anxiety and is currently on buspar 15 mg BID, reports symptoms are not well controlled on current regimen. She reports she has been on zoloft - was fuzzy - "zombie" - stopped within days/weeks of starting, and has not tried other agents. Discussed lexapro today.   She has a history of Systolic, denies dyspnea on exertion, orthopnea, paroxysmal nocturnal dyspnea and edema. Positive for none. Wt Readings from Last 3 Encounters:  01/18/17 139 lb (63 kg)  10/13/16 137 lb 12.8 oz (62.5 kg)  09/11/16 139 lb (63 kg)   Her blood pressure has been controlled at home, today their BP is BP: 124/72  She does workout, though  has been limited related to recent foot surgery. She is following up this week and  anticipates will be released to resume normal activities. She denies chest pain, shortness of breath, dizziness.   She is on cholesterol medication (red yeast rice) and denies myalgias. Her LDL cholesterol is at goal. The cholesterol last visit was:   Lab Results  Component Value Date   CHOL 143 10/13/2016   HDL 37 (L) 10/13/2016   LDLCALC 73 08/13/2016   TRIG 175 (H) 10/13/2016   CHOLHDL 3.9 10/13/2016    She has been working on diet and exercise for glucose management, and denies increased appetite, nausea, paresthesia of the feet, polydipsia, polyuria, visual disturbances and vomiting. Last A1C in the office was:  Lab Results  Component Value Date   HGBA1C 5.5 10/13/2016   Patient is on Vitamin D supplement and at goal at last check:    Lab Results  Component Value Date   VD25OH 73 10/13/2016       Current Medications:  Current Outpatient Medications on File Prior to Visit  Medication Sig  . acyclovir ointment (ZOVIRAX) 5 % Apply 1 application topically daily as needed (breakouts).  . Adapalene-Benzoyl Peroxide (EPIDUO) 0.1-2.5 % gel Apply 1 application topically as needed (acne).  Marland Kitchen albuterol (PROVENTIL HFA;VENTOLIN HFA) 108 (90 Base) MCG/ACT inhaler Inhale 2 puffs into the lungs every 4 (four) hours as needed for wheezing or shortness of breath.  Marland Kitchen aspirin EC 81 MG tablet Take 1 tablet (81 mg total) by mouth daily.  Marland Kitchen atorvastatin (LIPITOR) 40 MG tablet Take 40 mg by mouth 3 (three) times a week. On Tuesday, Thursday and Saturday  . busPIRone (BUSPAR) 15 MG tablet Take 1 tablet (15 mg total) by mouth 2 (two) times daily.  . Cholecalciferol (VITAMIN D PO) Take 1,000 Units by mouth daily.   Marland Kitchen estradiol (ESTRACE) 0.5 MG tablet Take 0.5 mg by mouth daily.  . fluticasone (FLONASE) 50 MCG/ACT nasal spray USE 2 SPRAYS IN EACH NOSTRIL DAILY  . furosemide (LASIX) 40 MG tablet Take 1 tablet daily for BP & Fluid  . losartan (COZAAR) 100 MG tablet TAKE 1 TABLET BY MOUTH DAILY FOR BLOOD  PRESSURE  . medroxyPROGESTERone (PROVERA) 2.5 MG tablet Take 2.5 mg by mouth daily.  . metoprolol succinate (TOPROL-XL) 100 MG 24 hr tablet TAKE 1/2 TO 1 TABLET DAILY AS DIRECTED FOR BP  . montelukast (SINGULAIR) 10 MG tablet Take 10 mg by mouth daily.  . Omega-3 Fatty Acids (FISH OIL PO) Take 1 capsule by mouth daily.  . Red Yeast Rice Extract (RED YEAST RICE PO) Take 1 tablet by mouth daily.  . valACYclovir (VALTREX) 500 MG tablet Take 500 mg by mouth 2 (two) times daily as needed (fever blister outbreak).   No current facility-administered medications on file prior to visit.      Allergies: No Known Allergies   Medical History:  Past Medical History:  Diagnosis Date  . Allergy   . Asthma   . Cardiomyopathy ([place])    a. Echo 8/16:  EF 40-45%, apical and ant-septal HK, Gr 2 DD  . Diverticula, colon   . History of cardiovascular stress test    Myoview 8/16:  EF 48%, anterior and apical defect c/w breast atten, no ischemia; Low Risk // Nuclear stress test 9/18: Low risk stress nuclear study with fixed septal defect consistent with LBBB vs prior infarct; no ischemia; EF 53 with septal akinesis.  . Hypertension   . Irregular periods/menstrual cycles   .  LBBB (left bundle branch block)   . Seasonal allergies   . TIA (transient ischemic attack)   . Torn meniscus    Family history- Reviewed and unchanged Social history- Reviewed and unchanged   Review of Systems:  Review of Systems  Constitutional: Negative for malaise/fatigue and weight loss.  HENT: Negative for hearing loss and tinnitus.   Eyes: Negative for blurred vision and double vision.  Respiratory: Negative for cough, shortness of breath and wheezing.   Cardiovascular: Negative for chest pain, palpitations, orthopnea, claudication and leg swelling.  Gastrointestinal: Negative for abdominal pain, blood in stool, constipation, diarrhea, heartburn, melena, nausea and vomiting.  Genitourinary: Negative.   Musculoskeletal:  Negative for back pain, falls, joint pain, myalgias and neck pain.  Skin: Negative for rash.  Neurological: Positive for sensory change (Intermittent tingling/numness of bilateral upper extremities, up to elbow, worse in AM). Negative for dizziness, tingling, weakness and headaches.  Endo/Heme/Allergies: Negative for polydipsia.  Psychiatric/Behavioral: Negative for depression, substance abuse and suicidal ideas. The patient is nervous/anxious. The patient does not have insomnia.   All other systems reviewed and are negative.   Physical Exam: BP 124/72   Pulse (!) 52   Temp 98 F (36.7 C)   Ht 5\' 1"  (1.549 m)   Wt 139 lb (63 kg)   SpO2 97%   BMI 26.26 kg/m  Wt Readings from Last 3 Encounters:  01/18/17 139 lb (63 kg)  10/13/16 137 lb 12.8 oz (62.5 kg)  09/11/16 139 lb (63 kg)   General Appearance: Well nourished, in no apparent distress. Eyes: PERRLA, EOMs, conjunctiva no swelling or erythema Sinuses: No Frontal/maxillary tenderness ENT/Mouth: Ext aud canals clear, TMs without erythema, bulging. No erythema, swelling, or exudate on post pharynx.  Tonsils not swollen or erythematous. Hearing normal.  Neck: Supple, thyroid normal.  Respiratory: Respiratory effort normal, BS equal bilaterally without rales, rhonchi, wheezing or stridor.  Cardio: RRR with no MRGs. Brisk peripheral pulses without edema.  Abdomen: Soft, + BS.  Non tender, no guarding, rebound, hernias, masses. Lymphatics: Non tender without lymphadenopathy.  Musculoskeletal: Full ROM, 5/5 strength, Normal gait Skin: Warm, dry without rashes, lesions, ecchymosis.  Neuro: Cranial nerves intact. No cerebellar symptoms. Sensation intact in office today.  Psych: Awake and oriented X 3, normal affect, Insight and Judgment appropriate.    Izora Ribas, NP 11:05 AM [facility]

## 2017-01-18 ENCOUNTER — Ambulatory Visit: Payer: BLUE CROSS/BLUE SHIELD | Admitting: Adult Health

## 2017-01-18 ENCOUNTER — Encounter: Payer: Self-pay | Admitting: Adult Health

## 2017-01-18 VITALS — BP 124/72 | HR 52 | Temp 98.0°F | Ht 61.0 in | Wt 139.0 lb

## 2017-01-18 DIAGNOSIS — E782 Mixed hyperlipidemia: Secondary | ICD-10-CM

## 2017-01-18 DIAGNOSIS — I10 Essential (primary) hypertension: Secondary | ICD-10-CM

## 2017-01-18 DIAGNOSIS — I5022 Chronic systolic (congestive) heart failure: Secondary | ICD-10-CM

## 2017-01-18 DIAGNOSIS — R7309 Other abnormal glucose: Secondary | ICD-10-CM | POA: Diagnosis not present

## 2017-01-18 DIAGNOSIS — F411 Generalized anxiety disorder: Secondary | ICD-10-CM

## 2017-01-18 DIAGNOSIS — R200 Anesthesia of skin: Secondary | ICD-10-CM | POA: Diagnosis not present

## 2017-01-18 DIAGNOSIS — R202 Paresthesia of skin: Secondary | ICD-10-CM | POA: Diagnosis not present

## 2017-01-18 DIAGNOSIS — Z6825 Body mass index (BMI) 25.0-25.9, adult: Secondary | ICD-10-CM | POA: Diagnosis not present

## 2017-01-18 DIAGNOSIS — E559 Vitamin D deficiency, unspecified: Secondary | ICD-10-CM | POA: Diagnosis not present

## 2017-01-18 DIAGNOSIS — Z79899 Other long term (current) drug therapy: Secondary | ICD-10-CM

## 2017-01-18 MED ORDER — ESCITALOPRAM OXALATE 10 MG PO TABS: 10.0000 mg | ORAL_TABLET | Freq: Every day | ORAL | 2 refills | Status: DC

## 2017-01-18 MED ORDER — OLMESARTAN MEDOXOMIL 40 MG PO TABS: ORAL_TABLET | ORAL | 1 refills | Status: DC

## 2017-01-18 NOTE — Patient Instructions (Addendum)
HOME CARE INSTRUCTIONS   Do not stand or sit in one position for long periods of time. Do not sit with your legs crossed. Rest with your legs raised during the day.  Your legs have to be higher than your heart so that gravity will force the valves to open, so please really elevate your legs.   Wear elastic stockings or support hose. Do not wear other tight, encircling garments around the legs, pelvis, or waist.  [employer]  has a wide variety of well priced compression stockings. [place], [place] [id number] [id number] [place] has a good cheap selection, I like the socks, they are not as hard to get on  Walk as much as possible to increase blood flow.  Raise the foot of your bed at night with 2-inch blocks. SEEK MEDICAL CARE IF:   The skin around your ankle starts to break down.  You have pain, redness, tenderness, or hard swelling developing in your leg over a vein.  You are uncomfortable due to leg pain. Document Released: 10/08/2004 Document Revised: 03/23/2011 Document Reviewed: 02/24/2010 [facility] Patient Information 2014 [place].     Cervical Radiculopathy Cervical radiculopathy happens when a nerve in the neck (cervical nerve) is pinched or bruised. This condition can develop because of an injury or as part of the normal aging process. Pressure on the cervical nerves can cause pain or numbness that runs from the neck all the way down into the arm and fingers. Usually, this condition gets better with rest. Treatment may be needed if the condition does not improve. What are the causes? This condition may be caused by:  Injury.  Slipped (herniated) disk.  Muscle tightness in the neck because of overuse.  Arthritis.  Breakdown or degeneration in the bones and joints of the spine (spondylosis) due to aging.  Bone spurs that may develop near the cervical nerves.  What are the signs or symptoms? Symptoms of this condition include:  Pain  that runs from the neck to the arm and hand. The pain can be severe or irritating. It may be worse when the neck is moved.  Numbness or weakness in the affected arm and hand.  How is this diagnosed? This condition may be diagnosed based on symptoms, medical history, and a physical exam. You may also have tests, including:  X-rays.  CT scan.  MRI.  Electromyogram (EMG).  Nerve conduction tests.  How is this treated? In many cases, treatment is not needed for this condition. With rest, the condition usually gets better over time. If treatment is needed, options may include:  Wearing a soft neck collar for short periods of time.  Physical therapy to strengthen your neck muscles.  Medicines, such as NSAIDs, oral corticosteroids, or spinal injections.  Surgery. This may be needed if other treatments do not help. Various types of surgery may be done depending on the cause of your problems.  Follow these instructions at home: Managing pain  Take over-the-counter and prescription medicines only as told by your health care provider.  If directed, apply ice to the affected area. ? Put ice in a plastic bag. ? Place a towel between your skin and the bag. ? Leave the ice on for 20 minutes, 2-3 times per day.  If ice does not help, you can try using heat. Take a warm shower or warm bath, or use a heat pack as told by your health care provider.  Try a gentle neck  and shoulder massage to help relieve symptoms. Activity  Rest as needed. Follow instructions from your health care provider about any restrictions on activities.  Do stretching and strengthening exercises as told by your health care provider or physical therapist. General instructions  If you were given a soft collar, wear it as told by your health care provider.  Use a flat pillow when you sleep.  Keep all follow-up visits as told by your health care provider. This is important. Contact a health care provider  if:  Your condition does not improve with treatment. Get help right away if:  Your pain gets much worse and cannot be controlled with medicines.  You have weakness or numbness in your hand, arm, face, or leg.  You have a high fever.  You have a stiff, rigid neck.  You lose control of your bowels or your bladder (have incontinence).  You have trouble with walking, balance, or speaking. This information is not intended to replace advice given to you by your health care provider. Make sure you discuss any questions you have with your health care provider. Document Released: 09/23/2000 Document Revised: 06/06/2015 Document Reviewed: 02/22/2014 Elsevier Interactive Patient Education  [employer].

## 2017-01-19 LAB — CBC WITH DIFFERENTIAL/PLATELET
Basophils Absolute: 41 cells/uL (ref 0–200)
Basophils Relative: 0.5 %
Eosinophils Absolute: 123 cells/uL (ref 15–500)
Eosinophils Relative: 1.5 %
HEMATOCRIT: 44.7 % (ref 35.0–45.0)
Hemoglobin: 14.9 g/dL (ref 11.7–15.5)
LYMPHS ABS: 1894 {cells}/uL (ref 850–3900)
MCH: 30.2 pg (ref 27.0–33.0)
MCHC: 33.3 g/dL (ref 32.0–36.0)
MCV: 90.5 fL (ref 80.0–100.0)
MPV: 12.2 fL (ref 7.5–12.5)
Monocytes Relative: 6.5 %
NEUTROS PCT: 68.4 %
Neutro Abs: 5609 cells/uL (ref 1500–7800)
Platelets: 312 10*3/uL (ref 140–400)
RBC: 4.94 10*6/uL (ref 3.80–5.10)
RDW: 13.6 % (ref 11.0–15.0)
Total Lymphocyte: 23.1 %
WBC: 8.2 10*3/uL (ref 3.8–10.8)
WBCMIX: 533 {cells}/uL (ref 200–950)

## 2017-01-19 LAB — HEPATIC FUNCTION PANEL
AG RATIO: 1.8 (calc) (ref 1.0–2.5)
ALKALINE PHOSPHATASE (APISO): 92 U/L (ref 33–130)
ALT: 17 U/L (ref 6–29)
AST: 15 U/L (ref 10–35)
Albumin: 4.3 g/dL (ref 3.6–5.1)
BILIRUBIN INDIRECT: 0.6 mg/dL (ref 0.2–1.2)
BILIRUBIN TOTAL: 0.7 mg/dL (ref 0.2–1.2)
Bilirubin, Direct: 0.1 mg/dL (ref 0.0–0.2)
Globulin: 2.4 g/dL (calc) (ref 1.9–3.7)
TOTAL PROTEIN: 6.7 g/dL (ref 6.1–8.1)

## 2017-01-19 LAB — HEMOGLOBIN A1C
Hgb A1c MFr Bld: 5.7 % of total Hgb — ABNORMAL HIGH (ref <5.7)
MEAN PLASMA GLUCOSE: 117 (calc)
eAG (mmol/L): 6.5 (calc)

## 2017-01-19 LAB — BASIC METABOLIC PANEL WITH GFR
BUN: 13 mg/dL (ref 7–25)
CO2: 29 mmol/L (ref 20–32)
CREATININE: 0.77 mg/dL (ref 0.50–1.05)
Calcium: 9.3 mg/dL (ref 8.6–10.4)
Chloride: 102 mmol/L (ref 98–110)
GFR, EST NON AFRICAN AMERICAN: 86 mL/min/{1.73_m2} (ref >=60)
GFR, Est African American: 100 mL/min/{1.73_m2} (ref >=60)
GLUCOSE: 111 mg/dL — AB (ref 65–99)
POTASSIUM: 4.7 mmol/L (ref 3.5–5.3)
SODIUM: 139 mmol/L (ref 135–146)

## 2017-01-19 LAB — LIPID PANEL
CHOL/HDL RATIO: 5 (calc) — AB (ref <5.0)
Cholesterol: 164 mg/dL (ref <200)
HDL: 33 mg/dL — ABNORMAL LOW (ref >50)
LDL CHOLESTEROL (CALC): 93 mg/dL
NON-HDL CHOLESTEROL (CALC): 131 mg/dL — AB (ref <130)
TRIGLYCERIDES: 263 mg/dL — AB (ref <150)

## 2017-01-19 LAB — TSH: TSH: 0.95 mIU/L (ref 0.40–4.50)

## 2017-02-10 ENCOUNTER — Other Ambulatory Visit: Payer: Self-pay | Admitting: Internal Medicine

## 2017-03-05 ENCOUNTER — Other Ambulatory Visit: Payer: Self-pay | Admitting: Internal Medicine

## 2017-04-26 ENCOUNTER — Ambulatory Visit: Payer: Self-pay | Admitting: Internal Medicine

## 2017-04-26 ENCOUNTER — Encounter: Payer: Self-pay | Admitting: Adult Health

## 2017-04-26 ENCOUNTER — Ambulatory Visit: Payer: BLUE CROSS/BLUE SHIELD | Admitting: Adult Health

## 2017-04-26 VITALS — BP 110/66 | HR 51 | Temp 97.3°F | Ht 61.0 in | Wt 136.0 lb

## 2017-04-26 DIAGNOSIS — J00 Acute nasopharyngitis [common cold]: Secondary | ICD-10-CM | POA: Diagnosis not present

## 2017-04-26 MED ORDER — DOXYCYCLINE HYCLATE 100 MG PO TABS: 100.0000 mg | ORAL_TABLET | Freq: Two times a day (BID) | ORAL | 0 refills | Status: AC

## 2017-04-26 MED ORDER — PREDNISONE 20 MG PO TABS: ORAL_TABLET | ORAL | 0 refills | Status: DC

## 2017-04-26 MED ORDER — PROMETHAZINE-DM 6.25-15 MG/5ML PO SYRP: 5.0000 mL | ORAL_SOLUTION | Freq: Four times a day (QID) | ORAL | 1 refills | Status: DC | PRN

## 2017-04-26 NOTE — Patient Instructions (Signed)
Common causes of cough OR hoarseness OR sore throat:   Allergies, Viral Infections, Acid Reflux and Bacterial Infections.   Allergies and viral infections cause a cough OR sore throat by post nasal drip and are often worse at night, can also have sneezing, lower grade fevers, clear/yellow mucus. This is best treated with allergy medications or nasal sprays.  Please get on allegra for 1-2 weeks The strongest is allegra or fexafinadine  Cheapest at [employer], [employer], [employer]   Bacterial infections are more severe than allergies or viral infections with fever, teeth pain, fatigue. This can be treated with prednisone and the same over the counter medication and after 7 days can be treated with an antibiotic.   Silent reflux/GERD can cause a cough OR sore throat OR hoarseness WITHOUT heart burn because the esophagus that goes to the stomach and trachea that goes to the lungs are very close and when you lay down the acid can irritate your throat and lungs. This can cause hoarseness, cough, and wheezing. Please stop any alcohol or anti-inflammatories like aleve/advil/ibuprofen and start an over the counter Prilosec or omeprazole 1-2 times daily 30mins before food for 2 weeks, then switch to over the counter zantac/ratinidine or pepcid/famotadine once at night for 2 weeks.    sometimes irritation causes more irritation. Try voice rest, use sugar free cough drops to prevent coughing, and try to stop clearing your throat.   If you ever have a cough that does not go away after trying these things please make a follow up visit for further evaluation or we can refer you to a specialist. Or if you ever have shortness of breath or chest pain go to the ER.    

## 2017-04-26 NOTE — Progress Notes (Signed)
Assessment and Plan:  Gail Bailey was seen today for uri.  Diagnoses and all orders for this visit:  Acute nasopharyngitis - Discussed the importance of avoiding unnecessary antibiotic therapy. Suggested symptomatic OTC remedies. Nasal saline spray for congestion. Nasal steroids, allergy pill, oral steroids Follow up as needed. Call with any new symptoms or concerns -     predniSONE (DELTASONE) 20 MG tablet; 2 tablets daily for 3 days, 1 tablet daily for 4 days. -     promethazine-dextromethorphan (PROMETHAZINE-DM) 6.25-15 MG/5ML syrup; Take 5 mLs by mouth 4 (four) times daily as needed for cough.  Other orders Fill and take only if cough is progressively productive:  -     doxycycline (VIBRA-TABS) 100 MG tablet; Take 1 tablet (100 mg total) by mouth 2 (two) times daily for 7 days.  Further disposition pending results of labs. Discussed med's effects and SE's.   Over 15 minutes of exam, counseling, chart review, and critical decision making was performed.   Future Appointments  Date Time Provider [place]  05/03/2017  9:15 AM Unk Pinto, MD [facility] None  11/05/2017  9:00 AM Unk Pinto, MD [facility] None    ------------------------------------------------------------------------------------------------------------------   HPI BP 110/66   Pulse (!) 51   Temp (!) 97.3 F (36.3 C)   Ht 5\' 1"  (1.549 m)   Wt 136 lb (61.7 kg)   SpO2 96%   BMI 25.70 kg/m   57 y.o.female with hx of mild intermittent asthma presents c/o URI symptoms; started with sore throat, hoarseness, progressed to cough, though unproductive. She denies fever/chills, does endorse mild headache, no vision changes, dizziness or neuro symptoms, URI symptoms ongoing now for 6 days. She was seen by an urgent care and has already completed a course of prednisone and zpak but reports symptoms are unchanged.  Denies hx of acid reflux or particularly severe seasonal allergies.   On singulair, flonase.  Hasn't taken anything for cough other than cough drops.   Past Medical History:  Diagnosis Date  . Allergy   . Asthma   . Cardiomyopathy ([place])    a. Echo 8/16:  EF 40-45%, apical and ant-septal HK, Gr 2 DD  . Diverticula, colon   . History of cardiovascular stress test    Myoview 8/16:  EF 48%, anterior and apical defect c/w breast atten, no ischemia; Low Risk // Nuclear stress test 9/18: Low risk stress nuclear study with fixed septal defect consistent with LBBB vs prior infarct; no ischemia; EF 53 with septal akinesis.  . Hypertension   . Irregular periods/menstrual cycles   . LBBB (left bundle branch block)   . Seasonal allergies   . TIA (transient ischemic attack)   . Torn meniscus      No Known Allergies  Current Outpatient Medications on File Prior to Visit  Medication Sig  . acyclovir ointment (ZOVIRAX) 5 % Apply 1 application topically daily as needed (breakouts).  . Adapalene-Benzoyl Peroxide (EPIDUO) 0.1-2.5 % gel Apply 1 application topically as needed (acne).  Marland Kitchen albuterol (PROVENTIL HFA;VENTOLIN HFA) 108 (90 Base) MCG/ACT inhaler Inhale 2 puffs into the lungs every 4 (four) hours as needed for wheezing or shortness of breath.  Marland Kitchen aspirin EC 81 MG tablet Take 1 tablet (81 mg total) by mouth daily.  Marland Kitchen atorvastatin (LIPITOR) 40 MG tablet Take 40 mg by mouth 3 (three) times a week. On Tuesday, Thursday and Saturday  . busPIRone (BUSPAR) 15 MG tablet TAKE ONE TABLET BY MOUTH TWICE A DAY  . Cholecalciferol (VITAMIN D  PO) Take 1,000 Units by mouth daily.   Marland Kitchen escitalopram (LEXAPRO) 10 MG tablet Take 1 tablet (10 mg total) by mouth daily.  Marland Kitchen estradiol (ESTRACE) 0.5 MG tablet Take 0.5 mg by mouth daily.  . furosemide (LASIX) 40 MG tablet Take 1 tablet daily for BP & Fluid  . losartan (COZAAR) 100 MG tablet TAKE 1 TABLET BY MOUTH DAILY FOR BLOOD PRESSURE  . medroxyPROGESTERone (PROVERA) 2.5 MG tablet Take 2.5 mg by mouth daily.  . metoprolol succinate (TOPROL-XL) 100 MG 24 hr  tablet TAKE 1/2 TO 1 TABLET DAILY AS DIRECTED FOR BP  . montelukast (SINGULAIR) 10 MG tablet Take 10 mg by mouth daily.  Marland Kitchen olmesartan (BENICAR) 40 MG tablet Take 1/2 tab daily; if BP 140/90, take full tab  . Omega-3 Fatty Acids (FISH OIL PO) Take 1 capsule by mouth daily.  . Red Yeast Rice Extract (RED YEAST RICE PO) Take 1 tablet by mouth daily.  . valACYclovir (VALTREX) 500 MG tablet Take 500 mg by mouth 2 (two) times daily as needed (fever blister outbreak).  Marland Kitchen atorvastatin (LIPITOR) 40 MG tablet TAKE 1/2 TO 1 TABLET DAILY OR AS DIRECTED FOR CHOLESTEROL  . fluticasone (FLONASE) 50 MCG/ACT nasal spray USE 2 SPRAYS IN EACH NOSTRIL DAILY (Patient not taking: Reported on 04/26/2017)   No current facility-administered medications on file prior to visit.     ROS: all negative except above.   Physical Exam:  BP 110/66   Pulse (!) 51   Temp (!) 97.3 F (36.3 C)   Ht 5\' 1"  (1.549 m)   Wt 136 lb (61.7 kg)   SpO2 96%   BMI 25.70 kg/m   General Appearance: Well nourished, in no apparent distress. Eyes: PERRLA, EOMs, conjunctiva no swelling or erythema Sinuses: No Frontal/maxillary tenderness ENT/Mouth: Ext aud canals clear, TMs without erythema, bulging. No erythema, swelling, or exudate on post pharynx.  Tonsils not swollen or erythematous. Hearing normal.  Neck: Supple, thyroid normal.  Respiratory: Respiratory effort normal, BS equal bilaterally without rales, rhonchi, wheezing or stridor.  Cardio: RRR with no MRGs. Brisk peripheral pulses without edema.  Abdomen: Soft, + BS.  Non tender. Lymphatics: Non tender without lymphadenopathy.  Musculoskeletal: Full ROM, 5/5 strength, normal gait.  Skin: Warm, dry without rashes, lesions, ecchymosis.  Neuro: Cranial nerves intact. Normal muscle tone, no cerebellar symptoms.   Psych: Awake and oriented X 3, normal affect, Insight and Judgment appropriate.     Izora Ribas, NP 2:55 PM [facility]

## 2017-05-02 ENCOUNTER — Encounter: Payer: Self-pay | Admitting: Internal Medicine

## 2017-05-02 NOTE — Progress Notes (Signed)
NO SHOW

## 2017-05-03 ENCOUNTER — Ambulatory Visit: Payer: Self-pay | Admitting: Internal Medicine

## 2017-05-19 ENCOUNTER — Ambulatory Visit: Payer: BLUE CROSS/BLUE SHIELD | Admitting: Physician Assistant

## 2017-05-19 VITALS — BP 130/84 | HR 56 | Temp 97.2°F | Resp 16 | Ht 61.0 in | Wt 134.8 lb

## 2017-05-19 DIAGNOSIS — I5022 Chronic systolic (congestive) heart failure: Secondary | ICD-10-CM | POA: Diagnosis not present

## 2017-05-19 DIAGNOSIS — E782 Mixed hyperlipidemia: Secondary | ICD-10-CM | POA: Diagnosis not present

## 2017-05-19 DIAGNOSIS — R7309 Other abnormal glucose: Secondary | ICD-10-CM

## 2017-05-19 DIAGNOSIS — Z79899 Other long term (current) drug therapy: Secondary | ICD-10-CM

## 2017-05-19 DIAGNOSIS — I10 Essential (primary) hypertension: Secondary | ICD-10-CM | POA: Diagnosis not present

## 2017-05-19 MED ORDER — PREDNISONE 20 MG PO TABS: ORAL_TABLET | ORAL | 0 refills | Status: AC

## 2017-05-19 NOTE — Patient Instructions (Addendum)
Phone: [phone]; Call for colonoscopy, you are due this year, Dr. Deatra Ina has retired.   [facility] [place] go to [facility]ology and give them your name. They will have the order and take you back. You do not any paper work, I should get the result back today or tomorrow. This order is good for a year.   Try the melatonin 5mg -20mg  dissolvable or gummy 30 mins before bed Try the unisom the generic is fine  11 Tips to Follow:  1. No caffeine after 3pm: Avoid beverages with caffeine (soda, tea, energy drinks, etc.) especially after 3pm. 2. Don't go to bed hungry: Have your evening meal at least 3 hrs. before going to sleep. It's fine to have a small bedtime snack such as a glass of milk and a few crackers but don't have a big meal. 3. Have a nightly routine before bed: Plan on "winding down" before you go to sleep. Begin relaxing about 1 hour before you go to bed. Try doing a quiet activity such as listening to calming music, reading a book or meditating. 4. Turn off the TV and ALL electronics including video games, tablets, laptops, etc. 1 hour before sleep, and keep them out of the bedroom. 5. Turn off your cell phone and all notifications (new email and text alerts) or even better, leave your phone outside your room while you sleep. Studies have shown that a part of your brain continues to respond to certain lights and sounds even while you're still asleep. 6. Make your bedroom quiet, dark and cool. If you can't control the noise, try wearing earplugs or using a fan to block out other sounds. 7. Practice relaxation techniques. Try reading a book or meditating or drain your brain by writing a list of what you need to do the next day. 8. Don't nap unless you feel sick: you'll have a better night's sleep. 9. Don't smoke, or quit if you do. Nicotine, alcohol, and marijuana can all keep you awake. Talk to your health care provider if you need help with substance use. 10. Most importantly,  wake up at the same time every day (or within 1 hour of your usual wake up time) EVEN on the weekends. A regular wake up time promotes sleep hygiene and prevents sleep problems. 11. Reduce exposure to bright light in the last three hours of the day before going to sleep. Maintaining good sleep hygiene and having good sleep habits lower your risk of developing sleep problems. Getting better sleep can also improve your concentration and alertness. Try the simple steps in this guide. If you still have trouble getting enough rest, make an appointment with your health care provider.  Check out  Mini habits for weight loss book  2 apps for tracking food is myfitness pal  loseit OR can take picture of your food     When it comes to diets, agreement about the perfect plan isn't easy to find, even among the experts. Experts at the [employer] developed an idea known as the Healthy Eating Plate. Just imagine a plate divided into logical, healthy portions.  The emphasis is on diet quality:  Load up on vegetables and fruits - one-half of your plate: Aim for color and variety, and remember that potatoes don't count.  Go for whole grains - one-quarter of your plate: Whole wheat, barley, wheat berries, quinoa, oats, brown rice, and foods made with them. If you want pasta, go with whole wheat  pasta.  Protein power - one-quarter of your plate: Fish, chicken, beans, and nuts are all healthy, versatile protein sources. Limit red meat.  The diet, however, does go beyond the plate, offering a few other suggestions.  Use healthy plant oils, such as olive, canola, soy, corn, sunflower and peanut. Check the labels, and avoid partially hydrogenated oil, which have unhealthy trans fats.  If you're thirsty, drink water. Coffee and tea are good in moderation, but skip sugary drinks and limit milk and dairy products to one or two daily servings.  The type of carbohydrate in the diet is more  important than the amount. Some sources of carbohydrates, such as vegetables, fruits, whole grains, and beans-are healthier than others.  Finally, stay active.

## 2017-05-19 NOTE — Progress Notes (Signed)
Assessment and Plan:   Essential hypertension - continue medications, DASH diet, exercise and monitor at home. Call if greater than 130/80.  -     CBC with Differential/Platelet -     BASIC METABOLIC PANEL WITH GFR -     Hepatic function panel -     TSH  Systolic and diastolic CHF, acute ([place]) Great job with weight loss,  goal BP close to 110/60 Follow up cardiology  Chronic systolic CHF (congestive heart failure) ([place]) Great job with weight loss,no symptoms Follow up cardiology  Hyperlipidemia -continue medications, check lipids, decrease fatty foods, increase activity.  -     Lipid panel  Medication management -     Magnesium  Prediabetes -     Hemoglobin A1c   Continue diet and meds as discussed. Further disposition pending results of labs. Future Appointments  Date Time Provider [place]  11/05/2017  9:00 AM Unk Pinto, MD [facility] None    HPI 57 y.o. female  presents for 3 month follow up with hypertension, hyperlipidemia, prediabetes and vitamin D.  She states she has been having trouble sleeping for a very long time, worse with hot flashes 1-2 years ago, she will take advil PM that will help.   Her blood pressure has been controlled at home, today their BP is BP: 130/84 She does workout, she walks over 10000 steps daily. She denies chest pain, shortness of breath, dizziness.  She has a history of asthma Still having bilateral UE numbness primarily at night, did not get cervical neck xray.  She has a history of CHF, last echo 2016, stress test 09/2016, EF 40-45% with grade 2 diastolic dysfunction. Her weight is down and she has been trying to do weight loss.  BMI is Body mass index is 25.47 kg/m., she is working on diet and exercise. Wt Readings from Last 3 Encounters:  05/19/17 134 lb 12.8 oz (61.1 kg)  04/26/17 136 lb (61.7 kg)  01/18/17 139 lb (63 kg)    She is not on cholesterol medication and denies myalgias. Her cholesterol is not at  goal. The cholesterol last visit was:   Lab Results  Component Value Date   CHOL 164 01/18/2017   HDL 33 (L) 01/18/2017   LDLCALC 93 01/18/2017   TRIG 263 (H) 01/18/2017   CHOLHDL 5.0 (H) 01/18/2017   She has been working on diet and exercise for prediabetes, and denies paresthesia of the feet, polydipsia and polyuria. Last A1C in the office was:  Lab Results  Component Value Date   HGBA1C 5.7 (H) 01/18/2017   Patient is on Vitamin D supplement.   Lab Results  Component Value Date   VD25OH 73 10/13/2016     Current Medications:  Current Outpatient Medications on File Prior to Visit  Medication Sig Dispense Refill  . acyclovir ointment (ZOVIRAX) 5 % Apply 1 application topically daily as needed (breakouts). 30 g 0  . Adapalene-Benzoyl Peroxide (EPIDUO) 0.1-2.5 % gel Apply 1 application topically as needed (acne). 45 g 0  . albuterol (PROVENTIL HFA;VENTOLIN HFA) 108 (90 Base) MCG/ACT inhaler Inhale 2 puffs into the lungs every 4 (four) hours as needed for wheezing or shortness of breath.    Marland Kitchen aspirin EC 81 MG tablet Take 1 tablet (81 mg total) by mouth daily.    Marland Kitchen atorvastatin (LIPITOR) 40 MG tablet TAKE 1/2 TO 1 TABLET DAILY OR AS DIRECTED FOR CHOLESTEROL 90 tablet 1  . busPIRone (BUSPAR) 15 MG tablet TAKE ONE TABLET BY MOUTH  TWICE A DAY 180 tablet 0  . Cholecalciferol (VITAMIN D PO) Take 1,000 Units by mouth daily.     Marland Kitchen escitalopram (LEXAPRO) 10 MG tablet Take 1 tablet (10 mg total) by mouth daily. 90 tablet 2  . estradiol (ESTRACE) 0.5 MG tablet Take 0.5 mg by mouth daily.    . fluticasone (FLONASE) 50 MCG/ACT nasal spray USE 2 SPRAYS IN EACH NOSTRIL DAILY 48 g 1  . furosemide (LASIX) 40 MG tablet Take 1 tablet daily for BP & Fluid 90 tablet 1  . losartan (COZAAR) 100 MG tablet TAKE 1 TABLET BY MOUTH DAILY FOR BLOOD PRESSURE 90 tablet 1  . medroxyPROGESTERone (PROVERA) 2.5 MG tablet Take 2.5 mg by mouth daily.    . metoprolol succinate (TOPROL-XL) 100 MG 24 hr tablet TAKE 1/2 TO  1 TABLET DAILY AS DIRECTED FOR BP 90 tablet 1  . montelukast (SINGULAIR) 10 MG tablet Take 10 mg by mouth daily.    Marland Kitchen olmesartan (BENICAR) 40 MG tablet Take 1/2 tab daily; if BP 140/90, take full tab 90 tablet 1  . Omega-3 Fatty Acids (FISH OIL PO) Take 1 capsule by mouth daily.    . Red Yeast Rice Extract (RED YEAST RICE PO) Take 1 tablet by mouth daily.    . valACYclovir (VALTREX) 500 MG tablet Take 500 mg by mouth 2 (two) times daily as needed (fever blister outbreak).     No current facility-administered medications on file prior to visit.    Medical History:  Past Medical History:  Diagnosis Date  . Allergy   . Asthma   . Cardiomyopathy ([place])    a. Echo 8/16:  EF 40-45%, apical and ant-septal HK, Gr 2 DD  . Diverticula, colon   . History of cardiovascular stress test    Myoview 8/16:  EF 48%, anterior and apical defect c/w breast atten, no ischemia; Low Risk // Nuclear stress test 9/18: Low risk stress nuclear study with fixed septal defect consistent with LBBB vs prior infarct; no ischemia; EF 53 with septal akinesis.  . Hypertension   . Irregular periods/menstrual cycles   . LBBB (left bundle branch block)   . Seasonal allergies   . TIA (transient ischemic attack)   . Torn meniscus    Allergies:  No Known Allergies Review of Systems  Constitutional: Negative.   HENT: Negative.   Eyes: Negative.   Respiratory: Negative.   Cardiovascular: Negative.   Gastrointestinal: Negative.   Genitourinary: Negative.   Musculoskeletal: Negative.   Skin: Negative.   Neurological: Negative.   Endo/Heme/Allergies: Negative.   Psychiatric/Behavioral: Negative.      Family history- Review and unchanged Social history- Review and unchanged Physical Exam: BP 130/84   Pulse (!) 56   Temp (!) 97.2 F (36.2 C)   Resp 16   Ht 5\' 1"  (1.549 m)   Wt 134 lb 12.8 oz (61.1 kg)   BMI 25.47 kg/m  Wt Readings from Last 3 Encounters:  05/19/17 134 lb 12.8 oz (61.1 kg)  04/26/17 136 lb  (61.7 kg)  01/18/17 139 lb (63 kg)   General Appearance: Well nourished, in no apparent distress. Eyes: PERRLA, EOMs, conjunctiva no swelling or erythema Sinuses: No Frontal/maxillary tenderness ENT/Mouth: Ext aud canals clear, TMs without erythema, bulging. No erythema, swelling, or exudate on post pharynx.  Tonsils not swollen or erythematous. Hearing normal.  Neck: Supple, thyroid normal.  Respiratory: Respiratory effort normal, BS equal bilaterally without rales, rhonchi, wheezing or stridor.  Cardio: RRR with no MRGs. Brisk  peripheral pulses without edema.  Abdomen: Soft, + BS.  Non tender, no guarding, rebound, hernias, masses. Lymphatics: Non tender without lymphadenopathy.  Musculoskeletal: Full ROM, 5/5 strength, normal gait.  Skin: Warm, dry without rashes, lesions, ecchymosis.  Neuro: Cranial nerves intact. Normal muscle tone, no cerebellar symptoms. Sensation intact.  Psych: Awake and oriented X 3, normal affect, Insight and Judgment appropriate.    Vicie Mutters 11:38 AM

## 2017-05-20 LAB — CBC WITH DIFFERENTIAL/PLATELET
Basophils Absolute: 41 cells/uL (ref 0–200)
Basophils Relative: 0.7 %
EOS ABS: 139 {cells}/uL (ref 15–500)
Eosinophils Relative: 2.4 %
HEMATOCRIT: 42.9 % (ref 35.0–45.0)
HEMOGLOBIN: 14.5 g/dL (ref 11.7–15.5)
LYMPHS ABS: 2082 {cells}/uL (ref 850–3900)
MCH: 30.7 pg (ref 27.0–33.0)
MCHC: 33.8 g/dL (ref 32.0–36.0)
MCV: 90.7 fL (ref 80.0–100.0)
MPV: 12.3 fL (ref 7.5–12.5)
Monocytes Relative: 9 %
NEUTROS ABS: 3016 {cells}/uL (ref 1500–7800)
Neutrophils Relative %: 52 %
Platelets: 330 10*3/uL (ref 140–400)
RBC: 4.73 10*6/uL (ref 3.80–5.10)
RDW: 12.8 % (ref 11.0–15.0)
Total Lymphocyte: 35.9 %
WBC: 5.8 10*3/uL (ref 3.8–10.8)
WBCMIX: 522 {cells}/uL (ref 200–950)

## 2017-05-20 LAB — COMPLETE METABOLIC PANEL WITH GFR
AG Ratio: 1.8 (calc) (ref 1.0–2.5)
ALBUMIN MSPROF: 4.2 g/dL (ref 3.6–5.1)
ALKALINE PHOSPHATASE (APISO): 78 U/L (ref 33–130)
ALT: 26 U/L (ref 6–29)
AST: 21 U/L (ref 10–35)
BILIRUBIN TOTAL: 0.5 mg/dL (ref 0.2–1.2)
BUN: 21 mg/dL (ref 7–25)
CO2: 27 mmol/L (ref 20–32)
CREATININE: 0.8 mg/dL (ref 0.50–1.05)
Calcium: 9.4 mg/dL (ref 8.6–10.4)
Chloride: 103 mmol/L (ref 98–110)
GFR, EST AFRICAN AMERICAN: 96 mL/min/{1.73_m2} (ref >=60)
GFR, Est Non African American: 82 mL/min/{1.73_m2} (ref >=60)
GLOBULIN: 2.4 g/dL (ref 1.9–3.7)
Glucose, Bld: 111 mg/dL — ABNORMAL HIGH (ref 65–99)
Potassium: 4.9 mmol/L (ref 3.5–5.3)
SODIUM: 138 mmol/L (ref 135–146)
TOTAL PROTEIN: 6.6 g/dL (ref 6.1–8.1)

## 2017-05-20 LAB — LIPID PANEL
CHOL/HDL RATIO: 4.1 (calc) (ref <5.0)
Cholesterol: 164 mg/dL (ref <200)
HDL: 40 mg/dL — AB (ref >50)
LDL CHOLESTEROL (CALC): 97 mg/dL
NON-HDL CHOLESTEROL (CALC): 124 mg/dL (ref <130)
TRIGLYCERIDES: 168 mg/dL — AB (ref <150)

## 2017-05-20 LAB — HEMOGLOBIN A1C
HEMOGLOBIN A1C: 5.6 %{Hb} (ref <5.7)
Mean Plasma Glucose: 114 (calc)
eAG (mmol/L): 6.3 (calc)

## 2017-05-20 LAB — TSH: TSH: 1.28 m[IU]/L (ref 0.40–4.50)

## 2017-05-31 IMAGING — NM NM MISC PROCEDURE
3 series · 18 of 18 positions shown · non-contrast
Comparison: none

[Series 1: stress-sum-em_(id)_sa · 6.4mm · 6.40mm/px · 6 of 64 frames shown]
[frame 6/64]
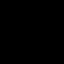
[frame 16/64]
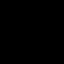
[frame 27/64]
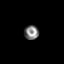
[frame 38/64]
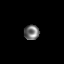
[frame 48/64]
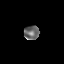
[frame 59/64]
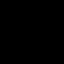

[Series 1: rest_(id)_sa · 6.4mm · 6.40mm/px · 6 of 64 frames shown]
[frame 6/64]
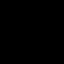
[frame 16/64]
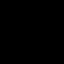
[frame 27/64]
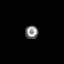
[frame 38/64]
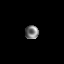
[frame 48/64]
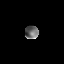
[frame 59/64]
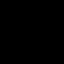

[Series 1: stress-gsp_(id)_sa · 6.4mm · 6.40mm/px · 6 of 512 frames shown]
[frame 43/512]
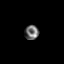
[frame 128/512]
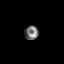
[frame 214/512]
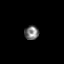
[frame 299/512]
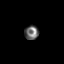
[frame 384/512]
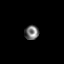
[frame 470/512]
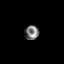

[18 of 18 positions shown; findings below may reference images not displayed]

Canned report from images found in remote index.

Refer to host system for actual result text.

## 2017-06-03 ENCOUNTER — Ambulatory Visit: Payer: BLUE CROSS/BLUE SHIELD | Admitting: Gastroenterology

## 2017-06-03 ENCOUNTER — Encounter: Payer: Self-pay | Admitting: Gastroenterology

## 2017-06-03 VITALS — BP 112/70 | HR 56 | Ht 60.0 in | Wt 135.1 lb

## 2017-06-03 DIAGNOSIS — K625 Hemorrhage of anus and rectum: Secondary | ICD-10-CM

## 2017-06-03 DIAGNOSIS — K648 Other hemorrhoids: Secondary | ICD-10-CM | POA: Diagnosis not present

## 2017-06-03 MED ORDER — SUPREP BOWEL PREP KIT 17.5-3.13-1.6 GM/177ML PO SOLN: ORAL | 0 refills | Status: DC

## 2017-06-03 NOTE — Progress Notes (Signed)
HPI :  57 year old female with a history of cardiomyopathy with ejection fraction of 40-45%, history of TIA in 2016, history of hypertension, referred here by Unk Pinto, MD for symptoms of rectal bleeding.  The patient states she has been having occasional rectal bleeding consisting of mostly red blood on the toilet paper usually 2 times a month or so in recent months. She states that a few weeks ago she had a GI illness in which she had multiple episodes of diarrhea, this was associated with significant amount of red blood per rectum. She states this was painless and she denies any perianal pain. Since that acute issue has resolved she has not had any constipation or diarrhea. She has not had much bleeding since that time. Overall baseline she has regular bowel habits without straining. She denies any family history of colon cancer. She denies any abdominal pain. She does endorse a history of having hemorrhoids when she was pregnant. Her last colonoscopy was in 2014 at which point she had a benign hyperplastic rectal polyp. She inquires about having another colonoscopy in light of her symptoms. She takes aspirin daily but is not on any other anticoagulation.  Colonoscopy 08/23/2012 - diverticulosis, small rectal polyp - benign hyperplastic polyp  Echo 08/19/2014 - 40-45%  Past Medical History:  Diagnosis Date  . Allergy   . Asthma   . Cardiomyopathy ([place])    a. Echo 8/16:  EF 40-45%, apical and ant-septal HK, Gr 2 DD  . Diverticula, colon   . History of cardiovascular stress test    Myoview 8/16:  EF 48%, anterior and apical defect c/w breast atten, no ischemia; Low Risk // Nuclear stress test 9/18: Low risk stress nuclear study with fixed septal defect consistent with LBBB vs prior infarct; no ischemia; EF 53 with septal akinesis.  . Hypertension   . Irregular periods/menstrual cycles   . LBBB (left bundle branch block)   . Seasonal allergies   . TIA (transient ischemic attack)   .  Torn meniscus      Past Surgical History:  Procedure Laterality Date  . BREAST REDUCTION SURGERY    . CARDIAC CATHETERIZATION    . CESAREAN SECTION     x2  . KNEE SURGERY     partial knee replacement right  . REPAIR QUADRICEPS / HAMSTRING MUSCLE    . TONSILLECTOMY    . tubes in ears  10/12   Family History  Problem Relation Age of Onset  . Diabetes Mother   . Heart disease Mother   . Hypertension Mother   . Heart attack Mother 1  . Other Father        MVA  . Asthma Sister   . Asthma Son   . Cancer Maternal Grandmother        Uterine  . Heart attack Paternal Grandmother   . Colon cancer Neg Hx    Social History   Tobacco Use  . Smoking status: Never Smoker  . Smokeless tobacco: Never Used  Substance Use Topics  . Alcohol use: Yes    Comment: 1-2 glasses wine nightly  . Drug use: No   Current Outpatient Medications  Medication Sig Dispense Refill  . acyclovir ointment (ZOVIRAX) 5 % Apply 1 application topically daily as needed (breakouts). 30 g 0  . Adapalene-Benzoyl Peroxide (EPIDUO) 0.1-2.5 % gel Apply 1 application topically as needed (acne). 45 g 0  . albuterol (PROVENTIL HFA;VENTOLIN HFA) 108 (90 Base) MCG/ACT inhaler Inhale 2 puffs into the lungs  every 4 (four) hours as needed for wheezing or shortness of breath.    Marland Kitchen aspirin EC 81 MG tablet Take 1 tablet (81 mg total) by mouth daily.    Marland Kitchen atorvastatin (LIPITOR) 40 MG tablet TAKE 1/2 TO 1 TABLET DAILY OR AS DIRECTED FOR CHOLESTEROL 90 tablet 1  . busPIRone (BUSPAR) 15 MG tablet TAKE ONE TABLET BY MOUTH TWICE A DAY 180 tablet 0  . Cholecalciferol (VITAMIN D PO) Take 1,000 Units by mouth daily.     Marland Kitchen escitalopram (LEXAPRO) 10 MG tablet Take 1 tablet (10 mg total) by mouth daily. 90 tablet 2  . estradiol (ESTRACE) 0.5 MG tablet Take 0.5 mg by mouth daily.    . fluticasone (FLONASE) 50 MCG/ACT nasal spray USE 2 SPRAYS IN EACH NOSTRIL DAILY 48 g 1  . furosemide (LASIX) 40 MG tablet Take 1 tablet daily for BP &  Fluid 90 tablet 1  . losartan (COZAAR) 100 MG tablet TAKE 1 TABLET BY MOUTH DAILY FOR BLOOD PRESSURE 90 tablet 1  . medroxyPROGESTERone (PROVERA) 2.5 MG tablet Take 2.5 mg by mouth daily.    . metoprolol succinate (TOPROL-XL) 100 MG 24 hr tablet TAKE 1/2 TO 1 TABLET DAILY AS DIRECTED FOR BP 90 tablet 1  . montelukast (SINGULAIR) 10 MG tablet Take 10 mg by mouth daily.    Marland Kitchen olmesartan (BENICAR) 40 MG tablet Take 1/2 tab daily; if BP 140/90, take full tab 90 tablet 1  . Omega-3 Fatty Acids (FISH OIL PO) Take 1 capsule by mouth daily.    . Red Yeast Rice Extract (RED YEAST RICE PO) Take 1 tablet by mouth daily.    . valACYclovir (VALTREX) 500 MG tablet Take 500 mg by mouth 2 (two) times daily as needed (fever blister outbreak).     No current facility-administered medications for this visit.    No Known Allergies   Review of Systems: All systems reviewed and negative except where noted in HPI.   Lab Results  Component Value Date   WBC 5.8 05/19/2017   HGB 14.5 05/19/2017   HCT 42.9 05/19/2017   MCV 90.7 05/19/2017   PLT 330 05/19/2017    Lab Results  Component Value Date   CREATININE 0.80 05/19/2017   BUN 21 05/19/2017   NA 138 05/19/2017   K 4.9 05/19/2017   CL 103 05/19/2017   CO2 27 05/19/2017    Lab Results  Component Value Date   ALT 26 05/19/2017   AST 21 05/19/2017   ALKPHOS 103 08/13/2016   BILITOT 0.5 05/19/2017     Physical Exam: BP 112/70 (BP Location: Left Arm, Patient Position: Sitting, Cuff Size: Normal)   Pulse (!) 56   Ht 5' (1.524 m) Comment: height measured without shoes  Wt 135 lb 2 oz (61.3 kg)   BMI 26.39 kg/m  Constitutional: Pleasant,well-developed, female in no acute distress. HEENT: Normocephalic and atraumatic. Conjunctivae are normal. No scleral icterus. Neck supple.  Cardiovascular: Normal rate, regular rhythm.  Pulmonary/chest: Effort normal and breath sounds normal. No wheezing, rales or rhonchi. Abdominal: Soft, nondistended,  nontender. . There are no masses palpable. No hepatomegaly. DRE / Anoscopy - no fissure, no mass lesions, small internal hemorrhoids, standby Barb Merino RN Extremities: no edema Lymphadenopathy: No cervical adenopathy noted. Neurological: Alert and oriented to person place and time. Skin: Skin is warm and dry. No rashes noted. Psychiatric: Normal mood and affect. Behavior is normal.   ASSESSMENT AND PLAN: 57 year old female with medical history as outlined above, presenting  with intermittent scant rectal bleeding. She otherwise had more significant rectal bleeding a few weeks ago in the setting of a suspected viral illness. No anemia.  Some small internal hemorrhoids noted on anoscopy in clinic, I don't see other clear pathology to cause the symptoms. Her bleeding very well may be due to hemorrhoids however given her last colonoscopy was about 5 years ago I'm recommending another colonoscopy to further evaluate clarify the source of her bleeding, rule out any polyps or mass lesions. I discussed the risks and benefits of colonoscopy and anesthesia with her, following this discussion she wanted to proceed. Further recommendations pending the result of this exam. In the interim if she has recurrent rectal bleeding she can try using 1% hydrocortisone cream applied within the anal canal see if this helps.  [place] Cellar, MD [facility]  CC: Unk Pinto, MD

## 2017-06-03 NOTE — Patient Instructions (Addendum)
If you are age 57 or older, your body mass index should be between 23-30. Your Body mass index is 26.39 kg/m. If this is out of the aforementioned range listed, please consider follow up with your Primary Care Provider.  If you are age 23 or younger, your body mass index should be between 19-25. Your Body mass index is 26.39 kg/m. If this is out of the aformentioned range listed, please consider follow up with your Primary Care Provider.   You have been scheduled for a colonoscopy. Please follow written instructions given to you at your visit today.  Please pick up your prep supplies at the pharmacy within the next 1-3 days. If you use inhalers (even only as needed), please bring them with you on the day of your procedure. Your physician has requested that you go to www.startemmi.com and enter the access code given to you at your visit today. This web site gives a general overview about your procedure. However, you should still follow specific instructions given to you by our office regarding your preparation for the procedure.   Thank you for entrusting me with your care and for choosing [facility], Dr. [place] Cellar

## 2017-06-21 LAB — HM MAMMOGRAPHY

## 2017-06-25 ENCOUNTER — Encounter: Payer: Self-pay | Admitting: *Deleted

## 2017-07-08 ENCOUNTER — Other Ambulatory Visit: Payer: Self-pay | Admitting: Internal Medicine

## 2017-07-08 MED ORDER — BUSPIRONE HCL 15 MG PO TABS: ORAL_TABLET | ORAL | 1 refills | Status: DC

## 2017-07-12 ENCOUNTER — Encounter: Payer: Self-pay | Admitting: Gastroenterology

## 2017-07-12 ENCOUNTER — Other Ambulatory Visit: Payer: Self-pay

## 2017-07-12 ENCOUNTER — Ambulatory Visit (AMBULATORY_SURGERY_CENTER): Payer: BLUE CROSS/BLUE SHIELD | Admitting: Gastroenterology

## 2017-07-12 VITALS — BP 106/57 | HR 53 | Temp 98.0°F | Resp 14 | Ht 60.0 in | Wt 135.0 lb

## 2017-07-12 DIAGNOSIS — K625 Hemorrhage of anus and rectum: Secondary | ICD-10-CM

## 2017-07-12 DIAGNOSIS — D127 Benign neoplasm of rectosigmoid junction: Secondary | ICD-10-CM

## 2017-07-12 DIAGNOSIS — K635 Polyp of colon: Secondary | ICD-10-CM | POA: Diagnosis not present

## 2017-07-12 MED ORDER — SODIUM CHLORIDE 0.9 % IV SOLN: 500.0000 mL | Freq: Once | INTRAVENOUS | Status: DC

## 2017-07-12 NOTE — Progress Notes (Signed)
Called to room to assist during endoscopic procedure.  Patient ID and intended procedure confirmed with present staff. Received instructions for my participation in the procedure from the performing physician.  

## 2017-07-12 NOTE — Progress Notes (Signed)
Report given to PACU, vss 

## 2017-07-12 NOTE — Op Note (Signed)
[place] Patient Name: Gail Bailey Procedure Date: 07/12/2017 10:28 AM MRN: [id number] Endoscopist: Remo Lipps P. Havery Moros , MD Age: 57 Referring MD:  Date of Birth: November 20, 1960 Gender: Female Account #: [id number] Procedure:                Colonoscopy Indications:              Hematochezia Medicines:                Monitored Anesthesia Care Procedure:                Pre-Anesthesia Assessment:                           - Prior to the procedure, a History and Physical                            was performed, and patient medications and                            allergies were reviewed. The patient's tolerance of                            previous anesthesia was also reviewed. The risks                            and benefits of the procedure and the sedation                            options and risks were discussed with the patient.                            All questions were answered, and informed consent                            was obtained. Prior Anticoagulants: The patient has                            taken no previous anticoagulant or antiplatelet                            agents. ASA Grade Assessment: III - A patient with                            severe systemic disease. After reviewing the risks                            and benefits, the patient was deemed in                            satisfactory condition to undergo the procedure.                           After obtaining informed consent, the colonoscope  was passed under direct vision. Throughout the                            procedure, the patient's blood pressure, pulse, and                            oxygen saturations were monitored continuously. The                            Model PCF-H190DL [id number]) scope was introduced                            through the anus and advanced to the the terminal                            ileum, with identification of the  appendiceal                            orifice and IC valve. The colonoscopy was performed                            without difficulty. The patient tolerated the                            procedure well. The quality of the bowel                            preparation was good. The terminal ileum, ileocecal                            valve, appendiceal orifice, and rectum were                            photographed. Scope In: 10:33:24 AM Scope Out: 10:50:23 AM Scope Withdrawal Time: 0 hours 12 minutes 49 seconds  Total Procedure Duration: 0 hours 16 minutes 59 seconds  Findings:                 The perianal and digital rectal examinations were                            normal.                           The terminal ileum appeared normal.                           Scattered medium-mouthed diverticula were found in                            the transverse colon and left colon.                           A 4 mm polyp was found in the recto-sigmoid colon.  The polyp was sessile. The polyp was removed with a                            cold snare. Resection and retrieval were complete.                           Internal hemorrhoids were found during retroflexion.                           The exam was otherwise without abnormality. Complications:            No immediate complications. Estimated blood loss:                            Minimal. Estimated Blood Loss:     Estimated blood loss was minimal. Impression:               - The examined portion of the ileum was normal.                           - Diverticulosis in the transverse colon and in the                            left colon.                           - One 4 mm polyp at the recto-sigmoid colon,                            removed with a cold snare. Resected and retrieved.                           - Internal hemorrhoids.                           - The examination was otherwise normal.                            Overall, bleeding symptoms likely due to internal                            hemorrhoids. Recommendation:           - Patient has a contact number available for                            emergencies. The signs and symptoms of potential                            delayed complications were discussed with the                            patient. Return to normal activities tomorrow.                            Written discharge instructions were provided to the  patient.                           - Resume previous diet.                           - Continue present medications.                           - Daily fiber supplement if hemorrhoids                            symptomatic, otherwise consideration for hemorrhoid                            banding if symptoms recur                           - Await pathology results.                           - Repeat colonoscopy for surveillance based on                            pathology results. Remo Lipps P. Armbruster, MD 07/12/2017 10:54:29 AM This report has been signed electronically.

## 2017-07-12 NOTE — Patient Instructions (Signed)
*  HANDOUTS ON POLYPS, HEMORRHOIDS, DIVERTICULOSIS GIVEN. DAILY FIBER SUPPLEMENTIF HEMORRHOIDS SYMPTOMATIC*  YOU HAD AN ENDOSCOPIC PROCEDURE TODAY AT [facility]:   Refer to the procedure report that was given to you for any specific questions about what was found during the examination.  If the procedure report does not answer your questions, please call your gastroenterologist to clarify.  If you requested that your care partner not be given the details of your procedure findings, then the procedure report has been included in a sealed envelope for you to review at your convenience later.  YOU SHOULD EXPECT: Some feelings of bloating in the abdomen. Passage of more gas than usual.  Walking can help get rid of the air that was put into your GI tract during the procedure and reduce the bloating. If you had a lower endoscopy (such as a colonoscopy or flexible sigmoidoscopy) you may notice spotting of blood in your stool or on the toilet paper. If you underwent a bowel prep for your procedure, you may not have a normal bowel movement for a few days.  Please Note:  You might notice some irritation and congestion in your nose or some drainage.  This is from the oxygen used during your procedure.  There is no need for concern and it should clear up in a day or so.  SYMPTOMS TO REPORT IMMEDIATELY:   Following lower endoscopy (colonoscopy or flexible sigmoidoscopy):  Excessive amounts of blood in the stool  Significant tenderness or worsening of abdominal pains  Swelling of the abdomen that is new, acute  Fever of 100F or higher   For urgent or emergent issues, a gastroenterologist can be reached at any hour by calling [phone].   DIET:  We do recommend a small meal at first, but then you may proceed to your regular diet.  Drink plenty of fluids but you should avoid alcoholic beverages for 24 hours.  ACTIVITY:  You should plan to take it easy for the rest of today and you should  NOT DRIVE or use heavy machinery until tomorrow (because of the sedation medicines used during the test).    FOLLOW UP: Our staff will call the number listed on your records the next business day following your procedure to check on you and address any questions or concerns that you may have regarding the information given to you following your procedure. If we do not reach you, we will leave a message.  However, if you are feeling well and you are not experiencing any problems, there is no need to return our call.  We will assume that you have returned to your regular daily activities without incident.  If any biopsies were taken you will be contacted by phone or by letter within the next 1-3 weeks.  Please call us at [phone] if you have not heard about the biopsies in 3 weeks.    SIGNATURES/CONFIDENTIALITY: You and/or your care partner have signed paperwork which will be entered into your electronic medical record.  These signatures attest to the fact that that the information above on your After Visit Summary has been reviewed and is understood.  Full responsibility of the confidentiality of this discharge information lies with you and/or your care-partner.

## 2017-07-13 ENCOUNTER — Other Ambulatory Visit: Payer: Self-pay | Admitting: Adult Health

## 2017-07-13 ENCOUNTER — Telehealth: Payer: Self-pay

## 2017-07-13 DIAGNOSIS — I10 Essential (primary) hypertension: Secondary | ICD-10-CM

## 2017-07-13 NOTE — Telephone Encounter (Signed)
NO ANSWER, MESSAGE LEFT FOR PATIENT. 

## 2017-07-13 NOTE — Telephone Encounter (Signed)
  Follow up Call-  Call Kleber Crean number 07/12/2017  Post procedure Call Kiala Faraj phone  # [phone]  Permission to leave phone message Yes  Some recent data might be hidden     Patient questions:  Do you have a fever, pain , or abdominal swelling? No. Pain Score  0 *  Have you tolerated food without any problems? Yes.    Have you been able to return to your normal activities? Yes.    Do you have any questions about your discharge instructions: Diet   No. Medications  No. Follow up visit  No.  Do you have questions or concerns about your Care? No.  Actions: * If pain score is 4 or above: No action needed, pain <4.

## 2017-07-19 ENCOUNTER — Encounter: Payer: Self-pay | Admitting: Gastroenterology

## 2017-08-12 ENCOUNTER — Other Ambulatory Visit: Payer: Self-pay | Admitting: Internal Medicine

## 2017-08-13 ENCOUNTER — Other Ambulatory Visit: Payer: Self-pay | Admitting: Internal Medicine

## 2017-08-13 MED ORDER — ADAPALENE-BENZOYL PEROXIDE 0.1-2.5 % EX GEL: 1.0000 "application " | CUTANEOUS | 0 refills | Status: DC | PRN

## 2017-08-15 ENCOUNTER — Other Ambulatory Visit: Payer: Self-pay | Admitting: Internal Medicine

## 2017-08-23 ENCOUNTER — Other Ambulatory Visit: Payer: Self-pay | Admitting: General Surgery

## 2017-08-23 DIAGNOSIS — C4362 Malignant melanoma of left upper limb, including shoulder: Secondary | ICD-10-CM

## 2017-08-25 ENCOUNTER — Ambulatory Visit (HOSPITAL_COMMUNITY)
Admission: RE | Admit: 2017-08-25 | Discharge: 2017-08-25 | Disposition: A | Discharge status: Home / Self Care | Payer: BLUE CROSS/BLUE SHIELD | Source: Ambulatory Visit | Attending: General Surgery | Admitting: General Surgery

## 2017-08-25 ENCOUNTER — Encounter (HOSPITAL_COMMUNITY)
Admission: RE | Admit: 2017-08-25 | Discharge: 2017-08-25 | Disposition: A | Discharge status: Home / Self Care | Payer: BLUE CROSS/BLUE SHIELD | Source: Ambulatory Visit | Attending: General Surgery | Admitting: General Surgery

## 2017-08-25 ENCOUNTER — Other Ambulatory Visit: Payer: Self-pay

## 2017-08-25 ENCOUNTER — Encounter (HOSPITAL_COMMUNITY): Payer: Self-pay

## 2017-08-25 DIAGNOSIS — C4362 Malignant melanoma of left upper limb, including shoulder: Secondary | ICD-10-CM | POA: Diagnosis not present

## 2017-08-25 DIAGNOSIS — C439 Malignant melanoma of skin, unspecified: Secondary | ICD-10-CM

## 2017-08-25 DIAGNOSIS — Z01818 Encounter for other preprocedural examination: Secondary | ICD-10-CM | POA: Diagnosis not present

## 2017-08-25 HISTORY — DX: Headache, unspecified: R51.9

## 2017-08-25 HISTORY — DX: Malignant melanoma of skin, unspecified: C43.9

## 2017-08-25 HISTORY — DX: Anxiety disorder, unspecified: F41.9

## 2017-08-25 HISTORY — DX: Family history of other specified conditions: Z84.89

## 2017-08-25 HISTORY — DX: Headache: R51

## 2017-08-25 LAB — COMPREHENSIVE METABOLIC PANEL
ALK PHOS: 78 U/L (ref 38–126)
ALT: 24 U/L (ref 0–44)
AST: 22 U/L (ref 15–41)
Albumin: 4 g/dL (ref 3.5–5.0)
Anion gap: 8 (ref 5–15)
BUN: 12 mg/dL (ref 6–20)
CALCIUM: 9 mg/dL (ref 8.9–10.3)
CHLORIDE: 105 mmol/L (ref 98–111)
CO2: 28 mmol/L (ref 22–32)
CREATININE: 0.79 mg/dL (ref 0.44–1.00)
GFR calc Af Amer: >60 mL/min (ref >60)
GFR calc non Af Amer: >60 mL/min (ref >60)
Glucose, Bld: 93 mg/dL (ref 70–99)
Potassium: 3.7 mmol/L (ref 3.5–5.1)
Sodium: 141 mmol/L (ref 135–145)
Total Bilirubin: 0.7 mg/dL (ref 0.3–1.2)
Total Protein: 6.7 g/dL (ref 6.5–8.1)

## 2017-08-25 LAB — CBC WITH DIFFERENTIAL/PLATELET
ABS IMMATURE GRANULOCYTES: 0 10*3/uL (ref 0.0–0.1)
BASOS PCT: 1 %
Basophils Absolute: 0.1 10*3/uL (ref 0.0–0.1)
Eosinophils Absolute: 0.2 10*3/uL (ref 0.0–0.7)
Eosinophils Relative: 2 %
HCT: 43.7 % (ref 36.0–46.0)
HEMOGLOBIN: 14.2 g/dL (ref 12.0–15.0)
IMMATURE GRANULOCYTES: 0 %
LYMPHS ABS: 2.3 10*3/uL (ref 0.7–4.0)
LYMPHS PCT: 27 %
MCH: 30.8 pg (ref 26.0–34.0)
MCHC: 32.5 g/dL (ref 30.0–36.0)
MCV: 94.8 fL (ref 78.0–100.0)
MONO ABS: 0.7 10*3/uL (ref 0.1–1.0)
Monocytes Relative: 8 %
NEUTROS ABS: 5.4 10*3/uL (ref 1.7–7.7)
NEUTROS PCT: 62 %
PLATELETS: 305 10*3/uL (ref 150–400)
RBC: 4.61 MIL/uL (ref 3.87–5.11)
RDW: 13.3 % (ref 11.5–15.5)
WBC: 8.6 10*3/uL (ref 4.0–10.5)

## 2017-08-25 LAB — LACTATE DEHYDROGENASE: LDH: 156 U/L (ref 98–192)

## 2017-08-25 NOTE — Pre-Procedure Instructions (Addendum)
   Gail Bailey  08/25/2017     [id number] - [place], [place] - [address] [place]  [address] Phone: [phone] Fax: [facility] [address] [place] [address] Phone: [phone] Fax: [phone]   Your procedure is scheduled on Tuesday, August 31, 2017  Report to [facility] Admitting at 7:30 A.M.  Call this number if you have problems the morning of surgery:  [phone]   Remember: Follow up with surgeon regarding Aspirin pre-operatively  Do not eat or drink after midnight Monday, August 30, 2017   You may drink clear liquids until 6:30 A.M. Clear liquids allowed are : Water , Juice ( non-citric and without pulp ),  Carbonated beverages, Clear tea, Black Coffee only, Plain Jello only,  Gatorade and Plain Popsicles only   Take these medicines the morning of surgery with A SIP OF WATER: atorvastatin (LIPITOR), escitalopram (LEXAPRO), estradiol (ESTRACE),  medroxyPROGESTERone (PROVERA), metoprolol succinate (TOPROL-XL), montelukast (SINGULAIR),  if needed: valACYclovir (VALTREX) for fever blister outbreak  Stop taking vitamins, fish oil and herbal medications. Do not take any NSAIDs ie: Ibuprofen, Advil, Naproxen (Aleve), Motrin, BC and Goody Powder; stop now.  Do not wear jewelry, make-up or nail polish.  Do not wear lotions, powders, or perfumes, or deodorant.  Do not shave 48 hours prior to surgery.   Do not bring valuables to the hospital.  [facility] is not responsible for any belongings or valuables.  Contacts, dentures or bridgework may not be worn into surgery.  Leave your suitcase in the car.  After surgery it may be brought to your room. Patients discharged the day of surgery will not be allowed to drive home.  Special instructions:See " [facility]-Preparing For Surgery " sheet Please read over  the following fact sheets that you were given. Pain Booklet, Coughing and Deep Breathing and Surgical Site Infection Prevention

## 2017-08-25 NOTE — Progress Notes (Signed)
Pt denies SOB and chest pain. Pt under the care of Dr. Acie Fredrickson, Cardiology. Pt stated that a cardiac cath was performed > 15 years ago. Pt denies having recent labs. Pt denies having a chest x ray within the last year. Spoke with Hassan Rowan at [facility] to have MD follow up with pt regarding pre-op Aspirin instructions. Pt chart forwarded to anesthesia for review.

## 2017-08-26 NOTE — Progress Notes (Signed)
Anesthesia Chart Review:  Case:  [id number] Date/Time:  08/31/17 0915   Procedure:  WIDE LOCAL EXCISION, ADVANCEMENT FLAP CLOSESURE, SLNBX RIGHT ARM MELANOMA (Right )   Anesthesia type:  General   Pre-op diagnosis:  right upper arm melanoma   Location:  [facility] OR ROOM 09 / [facility]   Surgeon:  Stark Klein, MD      DISCUSSION: 57 yo female never smoker for above procedure. Pertinent hx includes Asthma, Nonischemic cardiomyopathy (EF 53% by Lexiscan 2018), TIA in 2016, HTN, LBBB, HA, Anxiety.  Pt was previously followed by cardiology after TIA in 2016. Cardiac workup at that time included Echo which demonstrated EF 40-45% with apical and anteroseptal hypokinesis.  A follow up nuclear stress test demonstrated no ischemia.  Her EF was 48%.  She was ultimately set up for a cardiac MRI that demonstrated normal LVF with EF 59%.  She was seen by Richardson Dopp PA-C 09/09/2016 for chest discomfort that had both typical and atypical anginal features. She denied exertional chest symptoms and endorsed daily walking and yoga 3-4 times per week. Stress test was ordered to rule out ischemia.   Lexiscan stress test performed 09/16/2016 showed Low risk stress nuclear study with fixed septal defect consistent with LBBB vs prior infarct; no ischemia; EF 53 with septal akinesis.  Pt denies current cardiopulmonary symptoms. According to last PCP note 05/19/2017 pt walking 10000 steps daily without symptoms.  Anticipate she can proceed with surgery as planned barring acute status change.  VS: BP 140/68   Pulse (!) 58   Temp 36.5 C   Resp 20   Ht 5' (1.524 m)   Wt 62.9 kg   SpO2 95%   BMI 27.07 kg/m   PROVIDERS: Unk Pinto, MD is PCP, last seen in office by Vicie Mutters PA-C 05/19/2017  Nahser, Arnette Norris, MD is Cardiologst last seen in office by Richardson Dopp, PA-C 09/10/2016  LABS: Labs reviewed: Acceptable for surgery. (all labs ordered are listed, but only abnormal results are displayed)  Labs Reviewed  CBC  WITH DIFFERENTIAL/PLATELET  COMPREHENSIVE METABOLIC PANEL  LACTATE DEHYDROGENASE     IMAGES: CHEST - 2 VIEW 08/25/2017  COMPARISON:  August 18, 2014  FINDINGS: Heart, hila, and mediastinum are normal. Possible tiny granuloma in the left apex. No other nodules, masses, or focal infiltrates. Mild atelectasis in the left base. No other acute abnormalities.  IMPRESSION: No active cardiopulmonary disease.  EKG: 09/09/2016: Normal sinus rhythm.  Rightward axis.  Nonspecific intraventricular block.  T wave abnormality, consider inferior ischemia.  T wave abnormality, consider anterolateral ischemia.  No significant changes compared to previous.  CV: Nuclear Stress 09/16/2016:  Nuclear stress EF: 53%.  Defect 1: There is a large defect of severe severity present in the basal anteroseptal and mid anteroseptal location.  This is a low risk study.  The left ventricular ejection fraction is mildly decreased (45-54%).   Low risk stress nuclear study with fixed septal defect consistent with LBBB vs prior infarct; no ischemia; EF 53 with septal akinesis.  Cardiac MRI 03/04/2015 IMPRESSION: 1) Normal LV size and function with no RWMAls EF 59%  2) No infarct on delayed inversion recovery sequences using gadolinium  3) Mild LAE  Nuclear Stress 09/04/2014:  Nuclear stress EF: 48%.  There was no ST segment deviation noted during stress.  This is a low risk study.  The left ventricular ejection fraction is mildly decreased (45-54%).   Low risk stress nuclear study with a medium size, severe intensity, fixed distal  anterior and apical defect consistent with breast attenuation; no ischemia; mild global reduction in LV function (EF 48).  Echo 08/19/2014 Study Conclusions  - Left ventricle: The cavity size was normal. Wall thickness was   normal. Systolic function was mildly to moderately reduced. The   estimated ejection fraction was in the range of 40% to 45%.   Severe  hypokinesis of the apicalanteroseptal and apical   myocardium. Features are consistent with a pseudonormal left   ventricular filling pattern, with concomitant abnormal relaxation   and increased filling pressure (grade 2 diastolic dysfunction).  Past Medical History:  Diagnosis Date  . Allergy   . Anxiety   . Asthma   . Cardiomyopathy ([place])    a. Echo 8/16:  EF 40-45%, apical and ant-septal HK, Gr 2 DD  . CHF (congestive heart failure) ([place])   . Diverticula, colon   . Family history of adverse reaction to anesthesia    sisiter had PONV  . Headache    rare migraines  . History of cardiovascular stress test    Myoview 8/16:  EF 48%, anterior and apical defect c/w breast atten, no ischemia; Low Risk // Nuclear stress test 9/18: Low risk stress nuclear study with fixed septal defect consistent with LBBB vs prior infarct; no ischemia; EF 53 with septal akinesis.  . Hyperlipidemia   . Hypertension   . Irregular periods/menstrual cycles   . LBBB (left bundle branch block)   . Melanoma ([place])    right arm  . Seasonal allergies   . Stroke [facility])    TIA august 2016  . TIA (transient ischemic attack)   . Torn meniscus     Past Surgical History:  Procedure Laterality Date  . ABLATION    . BREAST REDUCTION SURGERY    . CARDIAC CATHETERIZATION    . CESAREAN SECTION     x2  . KNEE SURGERY     partial knee replacement right  . REPAIR QUADRICEPS / HAMSTRING MUSCLE    . TONSILLECTOMY    . tubes in ears  10/12    MEDICATIONS: . Adapalene-Benzoyl Peroxide (EPIDUO) 0.1-2.5 % gel  . albuterol (PROVENTIL HFA;VENTOLIN HFA) 108 (90 Base) MCG/ACT inhaler  . aspirin EC 81 MG tablet  . atorvastatin (LIPITOR) 40 MG tablet  . busPIRone (BUSPAR) 15 MG tablet  . Cholecalciferol (VITAMIN D PO)  . escitalopram (LEXAPRO) 10 MG tablet  . estradiol (ESTRACE) 0.5 MG tablet  . furosemide (LASIX) 40 MG tablet  . medroxyPROGESTERone (PROVERA) 2.5 MG tablet  . metoprolol succinate (TOPROL-XL) 100 MG  24 hr tablet  . montelukast (SINGULAIR) 10 MG tablet  . olmesartan (BENICAR) 40 MG tablet  . Omega-3 Fatty Acids (FISH OIL PO)  . valACYclovir (VALTREX) 500 MG tablet   No current facility-administered medications for this encounter.     Wynonia Musty [facility] Short Stay Center/Anesthesiology Phone [phone] 08/26/2017 11:53 AM

## 2017-08-31 ENCOUNTER — Encounter (HOSPITAL_COMMUNITY)
Admission: RE | Admit: 2017-08-31 | Payer: BLUE CROSS/BLUE SHIELD | Source: Ambulatory Visit | Admitting: General Surgery

## 2017-09-05 ENCOUNTER — Other Ambulatory Visit: Payer: Self-pay | Admitting: Internal Medicine

## 2017-09-07 ENCOUNTER — Other Ambulatory Visit: Payer: Self-pay

## 2017-09-07 ENCOUNTER — Encounter (HOSPITAL_BASED_OUTPATIENT_CLINIC_OR_DEPARTMENT_OTHER): Payer: Self-pay | Admitting: *Deleted

## 2017-09-10 NOTE — Progress Notes (Signed)
Ensure pre-surgical drink given with instructions to finish by 1100 day of surgery.  Hibiclens given and instructed how to use.  Patient verbalizes understanding.

## 2017-09-14 NOTE — H&P (Signed)
Aundra Millet Documented: 08/23/2017 3:01 PM Location: [facility] Patient #: [id number] DOB: [birth year] Married / Language: Cleophus Molt / Race: White Female   History of Present Illness Stark Klein MD; 08/23/2017 5:36 PM) The patient is a 57 year old female who presents with malignant melanoma. Pt is a 57 yo F who is referred for consultation at the request of Percell Miller, PA-C/[place] for new diagnosis of right upper arm melanoma. She had a mole that had been there for years that started enlarging, changing colors, and started itching. She has a history of many moles and called to get in for biopsy. Unfortunately, biopsy was positive for melanoma. This was a clarks level 2-3, breslow 0.8 mm superficial spreading melanoma. peripheral margin + for MIS, deep margin negative. No LVI/neurotropism. No satellitosis, ulceration, regression, mitoses. + brisk TIL. She also noted a few areas on her left shoulder that have started hurting. She would like those biopsied as well. She has no family history of cancer.    Past Surgical History (April Staton, [place]; 08/23/2017 3:01 PM) Cesarean Section - Multiple  Colon Polyp Removal - Colonoscopy  Colon Removal - Complete  Foot Surgery  Left. Knee Surgery  Bilateral. Mammoplasty; Reduction  Bilateral. Tonsillectomy   Diagnostic Studies History (April Staton, CMA; 08/23/2017 3:01 PM) Colonoscopy  within last year Mammogram  within last year Pap Smear  1-5 years ago  Allergies (April Staton, [place]; 08/23/2017 3:04 PM) No Known Drug Allergies [08/23/2017]:  Medication History (April Staton, CMA; 08/23/2017 3:08 PM) Estradiol (0.5MG  Tablet, Oral) Active. MedroxyPROGESTERone Acetate (2.5MG  Tablet, Oral) Active. Montelukast Sodium (10MG  Tablet, Oral) Active. BusPIRone HCl (15MG  Tablet, Oral) Active. Albuterol Sulfate (1.25MG /3ML Nebulized Soln, Inhalation) Active. Azithromycin (250MG  Tablet, Oral) Active. Ventolin HFA (108  (90 Base)MCG/ACT Aerosol Soln, Inhalation) Active. Atorvastatin Calcium (40MG  Tablet, Oral) Active. Escitalopram Oxalate (10MG  Tablet, Oral) Active. Olmesartan Medoxomil (40MG  Tablet, Oral) Active. Medications Reconciled  Social History (April Staton, [place]; 08/23/2017 3:01 PM) Alcohol use  Moderate alcohol use. Caffeine use  Coffee. No drug use  Tobacco use  Never smoker.  Family History (April Staton, [place]; 08/23/2017 3:01 PM) Arthritis  Mother. Bleeding disorder  Mother. Diabetes Mellitus  Mother. Heart Disease  Mother. Heart disease in female family member before age 90  Hypertension  Mother. Migraine Headache  Mother.  Pregnancy / Birth History (April Staton, [place]; 08/23/2017 3:01 PM) Age at menarche  58 years. Age of menopause  50-55 Gravida  2 Irregular periods  Length (months) of breastfeeding  3-6 Maternal age  60-35 Para  2  Other Problems (April Staton, CMA; 08/23/2017 3:01 PM) Asthma  Cerebrovascular Accident  High blood pressure  Hypercholesterolemia     Review of Systems (April Staton CMA; 08/23/2017 3:01 PM) Skin Present- Change in Wart/Mole. Not Present- Dryness, Hives, Jaundice, New Lesions, Non-Healing Wounds, Rash and Ulcer. HEENT Present- Ringing in the Ears. Not Present- Earache, Hearing Loss, Hoarseness, Nose Bleed, Oral Ulcers, Seasonal Allergies, Sinus Pain, Sore Throat, Visual Disturbances, Wears glasses/contact lenses and Yellow Eyes. Respiratory Not Present- Bloody sputum, Chronic Cough, Difficulty Breathing, Snoring and Wheezing. Breast Not Present- Breast Mass, Breast Pain, Nipple Discharge and Skin Changes. Cardiovascular Not Present- Chest Pain, Difficulty Breathing Lying Down, Leg Cramps, Palpitations, Rapid Heart Rate, Shortness of Breath and Swelling of Extremities. Gastrointestinal Not Present- Abdominal Pain, Bloating, Bloody Stool, Change in Bowel Habits, Chronic diarrhea, Constipation, Difficulty Swallowing, Excessive  gas, Gets full quickly at meals, Hemorrhoids, Indigestion, Nausea, Rectal Pain and Vomiting. Female Genitourinary Not Present- Frequency, Nocturia, Painful  Urination, Pelvic Pain and Urgency. Neurological Present- Headaches. Not Present- Decreased Memory, Fainting, Numbness, Seizures, Tingling, Tremor, Trouble walking and Weakness. Psychiatric Present- Change in Sleep Pattern. Not Present- Anxiety, Bipolar, Depression, Fearful and Frequent crying. Endocrine Present- Hot flashes. Not Present- Cold Intolerance, Excessive Hunger, Hair Changes, Heat Intolerance and New Diabetes.  Vitals (April Staton CMA; 08/23/2017 3:08 PM) 08/23/2017 3:08 PM Weight: 137.25 lb Height: 60in Body Surface Area: 1.59 m Body Mass Index: 26.8 kg/m  Temp.: 98.14F(Oral)  Pulse: 62 (Regular)  BP: 120/80 (Sitting, Left Arm, Standard)       Physical Exam Stark Klein MD; 08/23/2017 5:43 PM) General Mental Status-Alert. General Appearance-Consistent with stated age. Hydration-Well hydrated. Voice-Normal.  Integumentary Note: scab on posterior central right upper arm over triceps. no residual mass. numerous moles. left shoulder wtih two firm amelanotic nodules that are tender. No evidence of infection. These are biopsied.   Head and Neck Head-normocephalic, atraumatic with no lesions or palpable masses. Trachea-midline. Thyroid Gland Characteristics - normal size and consistency.  Eye Eyeball - Bilateral-Extraocular movements intact. Sclera/Conjunctiva - Bilateral-No scleral icterus.  Chest and Lung Exam Chest and lung exam reveals -quiet, even and easy respiratory effort with no use of accessory muscles and on auscultation, normal breath sounds, no adventitious sounds and normal vocal resonance. Inspection Chest Wall - Normal. Back - normal.  Cardiovascular Cardiovascular examination reveals -normal heart sounds, regular rate and rhythm with no murmurs and normal pedal  pulses bilaterally.  Abdomen Inspection Inspection of the abdomen reveals - No Hernias. Palpation/Percussion Palpation and Percussion of the abdomen reveal - Soft, Non Tender, No Rebound tenderness, No Rigidity (guarding) and No hepatosplenomegaly. Auscultation Auscultation of the abdomen reveals - Bowel sounds normal.  Neurologic Neurologic evaluation reveals -alert and oriented x 3 with no impairment of recent or remote memory. Mental Status-Normal.  Musculoskeletal Global Assessment -Note: no gross deformities.  Normal Exam - Left-Upper Extremity Strength Normal and Lower Extremity Strength Normal. Normal Exam - Right-Upper Extremity Strength Normal and Lower Extremity Strength Normal.  Lymphatic Head & Neck  General Head & Neck Lymphatics: Bilateral - Description - Normal. Axillary  General Axillary Region: Bilateral - Description - Normal. Tenderness - Non Tender. Femoral & Inguinal  Generalized Femoral & Inguinal Lymphatics: Bilateral - Description - No Generalized lymphadenopathy.    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 The depth is in the gray zone of 0.75-1 mm. The patient has no poor prognostic features of the melanoma, but the sentinel node is the most accurate way to stage. Her deep margin was negative and only in situ disease was seen at the periphery.  I advised the patient that we could go either way. Advantage of sentinel node are having reassuring staging info or the knowledge to do more therapy. Disadvantages include risk of infection, lymphedema, or wound issues.  She is going to think about it. She may seek a second opinion. These recommendations are in line with the most recent 2019 NCCN melanoma guidelines.  I discussed risks of procedure including bleeding, infection, wound breakdown, restrictions, large scar,  numbness of skin, and more. We will set this up expeditiously. Current Plans Schedule for Surgery Pt Education - Melanoma: skin MASS OF SKIN OF LEFT SHOULDER (R22.32) Current Plans PUNCH BIOPSY SKIN, FIRST LESION (11104) PUNCH BIOPSY SKIN, EACH ADD'L LESION (11105) The anatomy and pathophysiology of skin diseases is discussed. I made a  recommendation for punch biopsy of the skin through dermis for diagnostic purposes. Risk of bleeding and other risks were discussed. Alternatives discussed. Patient agreed to proceed.  Local anesthetic field block was placed. I used a 3 mm punch on the proximal lesion and a 4 mm skin punch on the distal lesion. I ensured hemostasis wtih simple 4-0 monocryl suture. Patient tolerated procedure well. We will followup on pathology with the patient and to help decide what further therapy is needed    Signed by Stark Klein, MD (08/23/2017 5:48 PM)

## 2017-09-15 ENCOUNTER — Encounter (HOSPITAL_BASED_OUTPATIENT_CLINIC_OR_DEPARTMENT_OTHER): Payer: Self-pay

## 2017-09-15 ENCOUNTER — Ambulatory Visit (HOSPITAL_COMMUNITY)
Admission: RE | Admit: 2017-09-15 | Discharge: 2017-09-15 | Disposition: A | Discharge status: Home / Self Care | Payer: BLUE CROSS/BLUE SHIELD | Source: Ambulatory Visit | Attending: General Surgery | Admitting: General Surgery

## 2017-09-15 ENCOUNTER — Encounter (HOSPITAL_COMMUNITY)
Admission: RE | Admit: 2017-09-15 | Discharge: 2017-09-15 | Disposition: A | Discharge status: Home / Self Care | Payer: BLUE CROSS/BLUE SHIELD | Source: Ambulatory Visit | Attending: General Surgery | Admitting: General Surgery

## 2017-09-15 ENCOUNTER — Other Ambulatory Visit: Payer: Self-pay

## 2017-09-15 ENCOUNTER — Encounter (HOSPITAL_BASED_OUTPATIENT_CLINIC_OR_DEPARTMENT_OTHER)
Admission: RE | Disposition: A | Discharge status: Home / Self Care | Payer: Self-pay | Source: Ambulatory Visit | Attending: General Surgery

## 2017-09-15 ENCOUNTER — Ambulatory Visit (HOSPITAL_BASED_OUTPATIENT_CLINIC_OR_DEPARTMENT_OTHER): Payer: BLUE CROSS/BLUE SHIELD | Admitting: Physician Assistant

## 2017-09-15 ENCOUNTER — Ambulatory Visit (HOSPITAL_BASED_OUTPATIENT_CLINIC_OR_DEPARTMENT_OTHER): Payer: BLUE CROSS/BLUE SHIELD | Admitting: Anesthesiology

## 2017-09-15 DIAGNOSIS — Z79899 Other long term (current) drug therapy: Secondary | ICD-10-CM | POA: Diagnosis not present

## 2017-09-15 DIAGNOSIS — Z8673 Personal history of transient ischemic attack (TIA), and cerebral infarction without residual deficits: Secondary | ICD-10-CM | POA: Diagnosis not present

## 2017-09-15 DIAGNOSIS — E7800 Pure hypercholesterolemia, unspecified: Secondary | ICD-10-CM | POA: Insufficient documentation

## 2017-09-15 DIAGNOSIS — J45909 Unspecified asthma, uncomplicated: Secondary | ICD-10-CM | POA: Diagnosis not present

## 2017-09-15 DIAGNOSIS — I10 Essential (primary) hypertension: Secondary | ICD-10-CM | POA: Diagnosis not present

## 2017-09-15 DIAGNOSIS — C4361 Malignant melanoma of right upper limb, including shoulder: Secondary | ICD-10-CM | POA: Diagnosis present

## 2017-09-15 DIAGNOSIS — D2261 Melanocytic nevi of right upper limb, including shoulder: Secondary | ICD-10-CM | POA: Insufficient documentation

## 2017-09-15 DIAGNOSIS — C4362 Malignant melanoma of left upper limb, including shoulder: Secondary | ICD-10-CM

## 2017-09-15 HISTORY — PX: MELANOMA EXCISION WITH SENTINEL LYMPH NODE BIOPSY: SHX5267

## 2017-09-15 SURGERY — MELANOMA EXCISION WITH SENTINEL LYMPH NODE BIOPSY
Anesthesia: General | Site: Arm Upper | Laterality: Right

## 2017-09-15 MED ORDER — ONDANSETRON HCL 4 MG/2ML IJ SOLN
INTRAMUSCULAR | Status: AC
  Filled 2017-09-15: qty 2

## 2017-09-15 MED ORDER — MIDAZOLAM HCL 2 MG/2ML IJ SOLN
INTRAMUSCULAR | Status: AC
  Filled 2017-09-15: qty 2

## 2017-09-15 MED ORDER — CHLORHEXIDINE GLUCONATE CLOTH 2 % EX PADS: 6.0000 | MEDICATED_PAD | Freq: Once | CUTANEOUS | Status: DC

## 2017-09-15 MED ORDER — PROMETHAZINE HCL 25 MG/ML IJ SOLN: 6.2500 mg | INTRAMUSCULAR | Status: DC | PRN

## 2017-09-15 MED ORDER — ROPIVACAINE HCL 7.5 MG/ML IJ SOLN
INTRAMUSCULAR | Status: DC | PRN
  Administered 2017-09-15: 20 mL via PERINEURAL

## 2017-09-15 MED ORDER — FENTANYL CITRATE (PF) 100 MCG/2ML IJ SOLN
50.0000 ug | INTRAMUSCULAR | Status: DC | PRN
  Administered 2017-09-15: 50 ug via INTRAVENOUS

## 2017-09-15 MED ORDER — FENTANYL CITRATE (PF) 100 MCG/2ML IJ SOLN
INTRAMUSCULAR | Status: DC | PRN
  Administered 2017-09-15: 50 ug via INTRAVENOUS
  Administered 2017-09-15 (×2): 25 ug via INTRAVENOUS

## 2017-09-15 MED ORDER — FENTANYL CITRATE (PF) 100 MCG/2ML IJ SOLN
INTRAMUSCULAR | Status: AC
  Filled 2017-09-15: qty 2

## 2017-09-15 MED ORDER — LIDOCAINE HCL 1 % IJ SOLN
INTRAMUSCULAR | Status: DC | PRN
  Administered 2017-09-15: 20 mL via SUBCUTANEOUS

## 2017-09-15 MED ORDER — SODIUM CHLORIDE 0.9 % IR SOLN
Status: DC | PRN
  Administered 2017-09-15: 1

## 2017-09-15 MED ORDER — ACETAMINOPHEN 500 MG PO TABS
ORAL_TABLET | ORAL | Status: AC
  Filled 2017-09-15: qty 2

## 2017-09-15 MED ORDER — OXYCODONE HCL 5 MG/5ML PO SOLN: 5.0000 mg | Freq: Once | ORAL | Status: DC | PRN

## 2017-09-15 MED ORDER — MIDAZOLAM HCL 2 MG/2ML IJ SOLN
1.0000 mg | INTRAMUSCULAR | Status: DC | PRN
  Administered 2017-09-15: 1 mg via INTRAVENOUS

## 2017-09-15 MED ORDER — GABAPENTIN 300 MG PO CAPS
ORAL_CAPSULE | ORAL | Status: AC
  Filled 2017-09-15: qty 1

## 2017-09-15 MED ORDER — METHYLENE BLUE 0.5 % INJ SOLN
INTRAVENOUS | Status: DC | PRN
  Administered 2017-09-15: 1 mL via INTRADERMAL

## 2017-09-15 MED ORDER — DEXAMETHASONE SODIUM PHOSPHATE 10 MG/ML IJ SOLN
INTRAMUSCULAR | Status: AC
  Filled 2017-09-15: qty 1

## 2017-09-15 MED ORDER — PROPOFOL 10 MG/ML IV BOLUS
INTRAVENOUS | Status: DC | PRN
  Administered 2017-09-15: 10 mg via INTRAVENOUS
  Administered 2017-09-15: 150 mg via INTRAVENOUS

## 2017-09-15 MED ORDER — OXYCODONE HCL 5 MG PO TABS: 5.0000 mg | ORAL_TABLET | Freq: Once | ORAL | Status: DC | PRN

## 2017-09-15 MED ORDER — CELECOXIB 200 MG PO CAPS
200.0000 mg | ORAL_CAPSULE | ORAL | Status: AC
  Administered 2017-09-15: 200 mg via ORAL

## 2017-09-15 MED ORDER — EPHEDRINE SULFATE-NACL 50-0.9 MG/10ML-% IV SOSY
PREFILLED_SYRINGE | INTRAVENOUS | Status: DC | PRN
  Administered 2017-09-15: 10 mg via INTRAVENOUS
  Administered 2017-09-15 (×4): 5 mg via INTRAVENOUS

## 2017-09-15 MED ORDER — OXYCODONE HCL 5 MG PO TABS: 5.0000 mg | ORAL_TABLET | Freq: Four times a day (QID) | ORAL | 0 refills | Status: DC | PRN

## 2017-09-15 MED ORDER — TECHNETIUM TC 99M SULFUR COLLOID FILTERED
0.5000 | Freq: Once | INTRAVENOUS | Status: AC | PRN
  Administered 2017-09-15: 0.5 via INTRADERMAL

## 2017-09-15 MED ORDER — ONDANSETRON HCL 4 MG/2ML IJ SOLN
INTRAMUSCULAR | Status: DC | PRN
  Administered 2017-09-15 (×2): 4 mg via INTRAVENOUS

## 2017-09-15 MED ORDER — CEFAZOLIN SODIUM-DEXTROSE 2-4 GM/100ML-% IV SOLN
INTRAVENOUS | Status: AC
  Filled 2017-09-15: qty 100

## 2017-09-15 MED ORDER — CELECOXIB 200 MG PO CAPS
ORAL_CAPSULE | ORAL | Status: AC
  Filled 2017-09-15: qty 1

## 2017-09-15 MED ORDER — HYDROMORPHONE HCL 1 MG/ML IJ SOLN: 0.2500 mg | INTRAMUSCULAR | Status: DC | PRN

## 2017-09-15 MED ORDER — LIDOCAINE 2% (20 MG/ML) 5 ML SYRINGE
INTRAMUSCULAR | Status: DC | PRN
  Administered 2017-09-15: 60 mg via INTRAVENOUS

## 2017-09-15 MED ORDER — SCOPOLAMINE 1 MG/3DAYS TD PT72: 1.0000 | MEDICATED_PATCH | Freq: Once | TRANSDERMAL | Status: DC | PRN

## 2017-09-15 MED ORDER — ACETAMINOPHEN 500 MG PO TABS
1000.0000 mg | ORAL_TABLET | ORAL | Status: AC
  Administered 2017-09-15: 1000 mg via ORAL

## 2017-09-15 MED ORDER — LACTATED RINGERS IV SOLN
INTRAVENOUS | Status: DC
  Administered 2017-09-15 (×2): via INTRAVENOUS

## 2017-09-15 MED ORDER — LIDOCAINE 2% (20 MG/ML) 5 ML SYRINGE
INTRAMUSCULAR | Status: AC
  Filled 2017-09-15: qty 5

## 2017-09-15 MED ORDER — DEXAMETHASONE SODIUM PHOSPHATE 10 MG/ML IJ SOLN
INTRAMUSCULAR | Status: DC | PRN
  Administered 2017-09-15: 10 mg via INTRAVENOUS

## 2017-09-15 MED ORDER — GABAPENTIN 300 MG PO CAPS
300.0000 mg | ORAL_CAPSULE | ORAL | Status: AC
  Administered 2017-09-15: 300 mg via ORAL

## 2017-09-15 MED ORDER — CEFAZOLIN SODIUM-DEXTROSE 2-4 GM/100ML-% IV SOLN
2.0000 g | INTRAVENOUS | Status: AC
  Administered 2017-09-15: 2 g via INTRAVENOUS

## 2017-09-15 SURGICAL SUPPLY — 81 items
ADH SKN CLS APL DERMABOND .7 (GAUZE/BANDAGES/DRESSINGS) ×1
APL SKNCLS STERI-STRIP NONHPOA (GAUZE/BANDAGES/DRESSINGS) ×1
APPLIER CLIP 11 MED OPEN (CLIP)
APPLIER CLIP 9.375 MED OPEN (MISCELLANEOUS)
APR CLP MED 11 20 MLT OPN (CLIP)
APR CLP MED 9.3 20 MLT OPN (MISCELLANEOUS)
BENZOIN TINCTURE PRP APPL 2/3 (GAUZE/BANDAGES/DRESSINGS) ×2 IMPLANT
BLADE CLIPPER SURG (BLADE) IMPLANT
BLADE HEX COATED 2.75 (ELECTRODE) ×3 IMPLANT
BLADE SURG 10 STRL SS (BLADE) ×3 IMPLANT
BLADE SURG 15 STRL LF DISP TIS (BLADE) ×1 IMPLANT
BLADE SURG 15 STRL SS (BLADE) ×3
BNDG COHESIVE 4X5 TAN STRL (GAUZE/BANDAGES/DRESSINGS) ×2 IMPLANT
BNDG GAUZE ELAST 4 BULKY (GAUZE/BANDAGES/DRESSINGS) IMPLANT
CANISTER SUCT 1200ML W/VALVE (MISCELLANEOUS) ×3 IMPLANT
CHLORAPREP W/TINT 26ML (MISCELLANEOUS) ×3 IMPLANT
CLIP APPLIE 11 MED OPEN (CLIP) IMPLANT
CLIP APPLIE 9.375 MED OPEN (MISCELLANEOUS) IMPLANT
CLIP VESOCCLUDE LG 6/CT (CLIP) IMPLANT
CLIP VESOCCLUDE MED 6/CT (CLIP) ×5 IMPLANT
CLIP VESOCCLUDE SM WIDE 6/CT (CLIP) ×3 IMPLANT
CLOSURE WOUND 1/2 X4 (GAUZE/BANDAGES/DRESSINGS) ×1
COVER MAYO STAND STRL (DRAPES) ×2 IMPLANT
COVER PROBE W GEL 5X96 (DRAPES) ×3 IMPLANT
DECANTER SPIKE VIAL GLASS SM (MISCELLANEOUS) IMPLANT
DERMABOND ADVANCED (GAUZE/BANDAGES/DRESSINGS) ×2
DERMABOND ADVANCED .7 DNX12 (GAUZE/BANDAGES/DRESSINGS) IMPLANT
DRAPE UTILITY XL STRL (DRAPES) ×4 IMPLANT
DRSG PAD ABDOMINAL 8X10 ST (GAUZE/BANDAGES/DRESSINGS) IMPLANT
DRSG TEGADERM 4X10 (GAUZE/BANDAGES/DRESSINGS) ×2 IMPLANT
DRSG TEGADERM 4X4.75 (GAUZE/BANDAGES/DRESSINGS) ×2 IMPLANT
ELECT REM PT RETURN 9FT ADLT (ELECTROSURGICAL) ×3
ELECTRODE REM PT RTRN 9FT ADLT (ELECTROSURGICAL) ×1 IMPLANT
GAUZE SPONGE 4X4 12PLY STRL (GAUZE/BANDAGES/DRESSINGS) ×2 IMPLANT
GAUZE SPONGE 4X4 12PLY STRL LF (GAUZE/BANDAGES/DRESSINGS) IMPLANT
GLOVE BIO SURGEON STRL SZ 6 (GLOVE) ×3 IMPLANT
GLOVE BIO SURGEON STRL SZ 6.5 (GLOVE) ×1 IMPLANT
GLOVE BIO SURGEONS STRL SZ 6.5 (GLOVE) ×1
GLOVE BIOGEL M STRL SZ7.5 (GLOVE) ×2 IMPLANT
GLOVE BIOGEL PI IND STRL 6.5 (GLOVE) ×1 IMPLANT
GLOVE BIOGEL PI IND STRL 7.0 (GLOVE) IMPLANT
GLOVE BIOGEL PI IND STRL 8 (GLOVE) IMPLANT
GLOVE BIOGEL PI INDICATOR 6.5 (GLOVE) ×2
GLOVE BIOGEL PI INDICATOR 7.0 (GLOVE) ×2
GLOVE BIOGEL PI INDICATOR 8 (GLOVE) ×2
GOWN STRL REUS W/ TWL LRG LVL3 (GOWN DISPOSABLE) ×1 IMPLANT
GOWN STRL REUS W/ TWL XL LVL3 (GOWN DISPOSABLE) IMPLANT
GOWN STRL REUS W/TWL 2XL LVL3 (GOWN DISPOSABLE) ×3 IMPLANT
GOWN STRL REUS W/TWL LRG LVL3 (GOWN DISPOSABLE) ×3
GOWN STRL REUS W/TWL XL LVL3 (GOWN DISPOSABLE) ×3
NDL HYPO 25X1 1.5 SAFETY (NEEDLE) ×2 IMPLANT
NDL SAFETY ECLIPSE 18X1.5 (NEEDLE) ×1 IMPLANT
NEEDLE HYPO 18GX1.5 SHARP (NEEDLE) ×3
NEEDLE HYPO 25X1 1.5 SAFETY (NEEDLE) ×6 IMPLANT
NS IRRIG 1000ML POUR BTL (IV SOLUTION) ×2 IMPLANT
PACK BASIN DAY SURGERY FS (CUSTOM PROCEDURE TRAY) ×3 IMPLANT
PACK UNIVERSAL I (CUSTOM PROCEDURE TRAY) ×3 IMPLANT
PENCIL BUTTON HOLSTER BLD 10FT (ELECTRODE) ×3 IMPLANT
PUNCH BIOPSY DERMAL 4MM (MISCELLANEOUS) ×2 IMPLANT
SLEEVE SCD COMPRESS KNEE MED (MISCELLANEOUS) ×3 IMPLANT
SLING ARM FOAM STRAP LRG (SOFTGOODS) IMPLANT
SPONGE LAP 18X18 RF (DISPOSABLE) ×2 IMPLANT
STOCKINETTE IMPERVIOUS LG (DRAPES) ×2 IMPLANT
STRIP CLOSURE SKIN 1/2X4 (GAUZE/BANDAGES/DRESSINGS) ×1 IMPLANT
SUT ETHILON 2 0 FS 18 (SUTURE) ×2 IMPLANT
SUT ETHILON 4 0 PS 2 18 (SUTURE) ×2 IMPLANT
SUT MON AB 4-0 PC3 18 (SUTURE) ×5 IMPLANT
SUT SILK 2 0 SH (SUTURE) ×2 IMPLANT
SUT VIC AB 2-0 SH 27 (SUTURE) ×3
SUT VIC AB 2-0 SH 27XBRD (SUTURE) IMPLANT
SUT VIC AB 3-0 SH 27 (SUTURE) ×6
SUT VIC AB 3-0 SH 27X BRD (SUTURE) ×1 IMPLANT
SYR BULB 3OZ (MISCELLANEOUS) ×3 IMPLANT
SYR CONTROL 10ML LL (SYRINGE) ×3 IMPLANT
SYR TB 1ML LL NO SAFETY (SYRINGE) ×3 IMPLANT
TOWEL GREEN STERILE FF (TOWEL DISPOSABLE) ×3 IMPLANT
TOWEL OR NON WOVEN STRL DISP B (DISPOSABLE) ×3 IMPLANT
TUBE CONNECTING 20'X1/4 (TUBING) ×1
TUBE CONNECTING 20X1/4 (TUBING) ×2 IMPLANT
UNDERPAD 30X30 (UNDERPADS AND DIAPERS) ×3 IMPLANT
YANKAUER SUCT BULB TIP NO VENT (SUCTIONS) ×3 IMPLANT

## 2017-09-15 NOTE — Discharge Instructions (Addendum)
[facility] Office Phone Number [phone]   POST OP INSTRUCTIONS  Always review your discharge instruction sheet given to you by the facility where your surgery was performed.  IF YOU HAVE DISABILITY OR FAMILY LEAVE FORMS, YOU MUST BRING THEM TO THE OFFICE FOR PROCESSING.  DO NOT GIVE THEM TO YOUR DOCTOR.  1. A prescription for pain medication may be given to you upon discharge.  Take your pain medication as prescribed, if needed.  If narcotic pain medicine is not needed, then you may take acetaminophen (Tylenol) or ibuprofen (Advil) as needed. 2. Take your usually prescribed medications unless otherwise directed 3. If you need a refill on your pain medication, please contact your pharmacy.  They will contact our office to request authorization.  Prescriptions will not be filled after 5pm or on week-ends. 4. You should eat very light the first 24 hours after surgery, such as soup, crackers, pudding, etc.  Resume your normal diet the day after surgery 5. It is common to experience some constipation if taking pain medication after surgery.  Increasing fluid intake and taking a stool softener will usually help or prevent this problem from occurring.  A mild laxative (Milk of Magnesia or Miralax) should be taken according to package directions if there are no bowel movements after 48 hours. 6. You may shower in 48 hours.  The surgical glue will flake off in 2-3 weeks.   7. ACTIVITIES:  No strenuous activity or heavy lifting for 1 week.   a. You may drive when you no longer are taking prescription pain medication, you can comfortably wear a seatbelt, and you can safely maneuver your car and apply brakes. b. RETURN TO WORK:  _________1 week or so___________ Dennis Bast should see your doctor in the office for a follow-up appointment approximately three-four weeks after your surgery.    WHEN TO CALL YOUR DOCTOR: 1. Fever over 101.0 2. Nausea and/or vomiting. 3. Extreme swelling or  bruising. 4. Continued bleeding from incision. 5. Increased pain, redness, or drainage from the incision.  The clinic staff is available to answer your questions during regular business hours.  Please dont hesitate to call and ask to speak to one of the nurses for clinical concerns.  If you have a medical emergency, go to the nearest emergency room or call 911.  A surgeon from [facility] is always on call at the hospital.  For further questions, please visit [email or web address]        Post Anesthesia Home Care Instructions  Activity: Get plenty of rest for the remainder of the day. A responsible individual must stay with you for 24 hours following the procedure.  For the next 24 hours, DO NOT: -Drive a car -Paediatric nurse -Drink alcoholic beverages -Take any medication unless instructed by your physician -Make any legal decisions or sign important papers.  Meals: Start with liquid foods such as gelatin or soup. Progress to regular foods as tolerated. Avoid greasy, spicy, heavy foods. If nausea and/or vomiting occur, drink only clear liquids until the nausea and/or vomiting subsides. Call your physician if vomiting continues.  Special Instructions/Symptoms: Your throat may feel dry or sore from the anesthesia or the breathing tube placed in your throat during surgery. If this causes discomfort, gargle with warm salt water. The discomfort should disappear within 24 hours.  If you had a scopolamine patch placed behind your ear for the management of post- operative nausea and/or vomiting:  1. The medication in the patch is effective for  72 hours, after which it should be removed.  Wrap patch in a tissue and discard in the trash. Wash hands thoroughly with soap and water. 2. You may remove the patch earlier than 72 hours if you experience unpleasant side effects which may include dry mouth, dizziness or visual disturbances. 3. Avoid touching the patch. Wash your  hands with soap and water after contact with the patch.

## 2017-09-15 NOTE — Anesthesia Postprocedure Evaluation (Signed)
Anesthesia Post Note  Patient: Gail Bailey  Procedure(s) Performed: BIOPSY RIGHT SHOULDER MOLE AND WIDE LOCAL EXCISION OF RIGHT ARM MELANOMA, ADVANCEMENT FLAP CLOSURE, SENTINEL LYMPH NODE BIOPSY  RIGHT ARM MELANOMA (Right Arm Upper)     Patient location during evaluation: PACU Anesthesia Type: General Level of consciousness: awake Pain management: pain level controlled Vital Signs Assessment: post-procedure vital signs reviewed and stable Respiratory status: spontaneous breathing Cardiovascular status: blood pressure returned to baseline Postop Assessment: no apparent nausea or vomiting Anesthetic complications: no    Last Vitals:  Vitals:   09/15/17 1645 09/15/17 1700  BP: (!) 176/88   Pulse: 98 99  Resp: 14 15  Temp:    SpO2: 100% 99%    Last Pain:  Vitals:   09/15/17 1640  TempSrc:   PainSc: 0-No pain                 [place]

## 2017-09-15 NOTE — Anesthesia Procedure Notes (Signed)
Procedure Name: LMA Insertion Date/Time: 09/15/2017 3:23 PM Performed by: Gwyndolyn Saxon, CRNA Pre-anesthesia Checklist: Patient identified, Emergency Drugs available, Suction available and Patient being monitored Patient Re-evaluated:Patient Re-evaluated prior to induction Oxygen Delivery Method: Circle system utilized Preoxygenation: Pre-oxygenation with 100% oxygen Induction Type: IV induction Ventilation: Mask ventilation without difficulty LMA: LMA inserted LMA Size: 3.0 Number of attempts: 1 Placement Confirmation: positive ETCO2,  CO2 detector and breath sounds checked- equal and bilateral Tube secured with: Tape Dental Injury: Teeth and Oropharynx as per pre-operative assessment

## 2017-09-15 NOTE — Progress Notes (Signed)
Assisted Dr. Ellender with right, ultrasound guided, pectoralis block. Side rails up, monitors on throughout procedure. See vital signs in flow sheet. Tolerated Procedure well. °

## 2017-09-15 NOTE — Interval H&P Note (Signed)
History and Physical Interval Note:  09/15/2017 3:08 PM  Gail Bailey  has presented today for surgery, with the diagnosis of right upper arm melanoma  The various methods of treatment have been discussed with the patient and family. After consideration of risks, benefits and other options for treatment, the patient has consented to  Procedure(s): WIDE LOCAL EXCISION, ADVANCEMENT FLAP CLOSESURE, SENTINEL LYMPH NODE BIOPSY  RIGHT ARM MELANOMA (Right) as a surgical intervention .  The patient's history has been reviewed, patient examined, no change in status, stable for surgery.  I have reviewed the patient's chart and labs.  Questions were answered to the patient's satisfaction.     Stark Klein

## 2017-09-15 NOTE — Anesthesia Preprocedure Evaluation (Addendum)
Anesthesia Evaluation  Patient identified by MRN, date of birth, ID band Patient awake    Reviewed: Allergy & Precautions, NPO status , Patient's Chart, lab work & pertinent test results, reviewed documented beta blocker date and time   Airway Mallampati: III  TM Distance: >3 FB Neck ROM: Full    Dental no notable dental hx.    Pulmonary asthma ,    Pulmonary exam normal breath sounds clear to auscultation       Cardiovascular hypertension, Pt. on home beta blockers and Pt. on medications +CHF  Normal cardiovascular exam Rhythm:Regular Rate:Normal  ECG: NSR, rate 64  Nuclear stress EF: 53%. Defect 1: There is a large defect of severe severity present in the basal anteroseptal and mid anteroseptal location. This is a low risk study. The left ventricular ejection fraction is mildly decreased (45-54%).  Low risk stress nuclear study with fixed septal defect consistent with LBBB vs prior infarct; no ischemia; EF 53 with septal akinesis.    Neuro/Psych  Headaches, PSYCHIATRIC DISORDERS Anxiety TIA   GI/Hepatic negative GI ROS, Neg liver ROS,   Endo/Other  negative endocrine ROS  Renal/GU negative Renal ROS     Musculoskeletal negative musculoskeletal ROS (+)   Abdominal   Peds  Hematology HLD   Anesthesia Other Findings Right upper arm melanoma  Reproductive/Obstetrics                            Anesthesia Physical Anesthesia Plan  ASA: III  Anesthesia Plan: General and Regional   Post-op Pain Management: GA combined w/ Regional for post-op pain   Induction: Intravenous  PONV Risk Score and Plan: 3 and Midazolam, Dexamethasone, Ondansetron and Treatment may vary due to age or medical condition  Airway Management Planned: LMA  Additional Equipment:   Intra-op Plan:   Post-operative Plan: Extubation in OR  Informed Consent: I have reviewed the patients History and Physical,  chart, labs and discussed the procedure including the risks, benefits and alternatives for the proposed anesthesia with the patient or authorized representative who has indicated his/her understanding and acceptance.   Dental advisory given  Plan Discussed with: CRNA  Anesthesia Plan Comments:        Anesthesia Quick Evaluation

## 2017-09-15 NOTE — Anesthesia Procedure Notes (Signed)
Anesthesia Regional Block: Pectoralis block   Pre-Anesthetic Checklist: ,, timeout performed, Correct Patient, Correct Site, Correct Laterality, Correct Procedure,, site marked, risks and benefits discussed, Surgical consent,  Pre-op evaluation,  At surgeon's request and post-op pain management  Laterality: Right  Prep: chloraprep       Needles:  Injection technique: Single-shot  Needle Type: Echogenic Stimulator Needle     Needle Length: 10cm  Needle Gauge: 21     Additional Needles:   Procedures:,,,, ultrasound used (permanent image in chart),,,,  Narrative:  Start time: 09/15/2017 2:20 PM End time: 09/15/2017 2:30 PM Injection made incrementally with aspirations every 5 mL.  Performed by: Personally  Anesthesiologist: Murvin Natal, MD  Additional Notes: Functioning IV was confirmed and monitors were applied.  A 185mm 21ga P[place]unk echogenic stimulator needle was used. Sterile prep, hand hygiene and sterile gloves were used.  Negative aspiration and negative test dose prior to incremental administration of local anesthetic. The patient tolerated the procedure well.

## 2017-09-15 NOTE — Op Note (Signed)
PRE-OPERATIVE DIAGNOSIS: cT1 N0 right upper arm melanoma, mole of right shoulder  POST-OPERATIVE DIAGNOSIS:  Same  PROCEDURE:  Procedure(s): Wide local excision 1 cm margins right upper arm melanoma, advancement flap closure for defect 2.5 cm x 6 cm, right axillary sentinel lymph node mapping and biopsy, punch biopsy right shoulder mole  SURGEON:  Surgeon(s): Stark Klein, MD  ANESTHESIA:   local and general  DRAINS: none   LOCAL MEDICATIONS USED:  MARCAINE and XYLOCAINE   SPECIMEN:  Source of Specimen: wide local excision right arm melanoma, right shoulder mole, right axillary sentinel lymph nodes 1-2  FINDINGS:  SLN #1 hot, cps 390.  SLN #2 hot and blue, cps 950.    DISPOSITION OF SPECIMEN:  PATHOLOGY  COUNTS:  YES  PLAN OF CARE: Discharge to home after PACU  PATIENT DISPOSITION:  PACU - hemodynamically stable.    PROCEDURE:   Pt was identified in the holding area, taken to the OR, and placed supine on the OR table.  General anesthesia was induced.  Time out was performed according to the surgical safety checklist.  When all was correct, we continued.   The patient's right arm and chest were prepped and draped in sterile fashion.   One mL methylene blue was injected intradermally around the melanoma biopsy site.    The right shoulder mole was punched with a 4 mm punch biopsy after administration of local.  The skin was reapproximated with an interrupted 4-0 monocryl suture. This was cleaned, dried, and dressed with a spot bandaid.     The melanoma was identified and 1 cm margins were marked out.  This was turned into an ellipse.  Local was administered under the melanoma and the adjacent tissue.  A #10 blade was used to incise the skin around the melanoma.  The cautery was used to take the dissection down to the fascia.  The skin was marked in situ with silk suture.  The cautery was used to take the specimen off the fascia, and it was passed off the table.    Skin hooks were  used to elevate the edges of the incision and the skin was freed up in all directions.  This was pulled together in a longitudinal orientation.  The skin was pulled together. Deep interrupted 2-0 vicryl sutures were placed to relieve tension.  The skin was then reapproximated with 3-0 interrupted vicryl deep dermal sutures and 4-0 monocryl running subcuticular sutures.  Three 2-0 nylon horizontal mattress sutures were placed as well.  The wound was dressed with Benzoin, steristrips, gauze, and tegaderm.    The point of maximum signal intensity was identified with the neoprobe.  A 4 cm incision was made with a #15 blade.  The subcutaneous tissues were divided with the cautery.  A Weitlaner retractor was used to assist with visualization.  The tonsil clamp was used to bluntly dissect the axillary fat pad.  4 sentinel lymph nodes were identified as described above.  The lymphovascular channels were clipped with hemoclips.  The nodes were passed off as specimens.  Hemostasis was achieved with the cautery.  The axilla was irrigated and closed with 3-0 Vicryl deep dermal interrupted sutures and 4-0 Monocryl running subcuticular suture.  This was cleaned, dried, and dressed with dermabond and a steristrip.    Needle, sponge, and instrument counts were correct.  The patient was awakened from anesthesia and taken to the PACU in stable condition.

## 2017-09-15 NOTE — Transfer of Care (Signed)
Immediate Anesthesia Transfer of Care Note  Patient: Gail Bailey  Procedure(s) Performed: BIOPSY RIGHT SHOULDER MOLE AND WIDE LOCAL EXCISION OF RIGHT ARM MELANOMA, ADVANCEMENT FLAP CLOSURE, SENTINEL LYMPH NODE BIOPSY  RIGHT ARM MELANOMA (Right Arm Upper)  Patient Location: PACU  Anesthesia Type:General  Level of Consciousness: drowsy  Airway & Oxygen Therapy: Patient Spontanous Breathing and Patient connected to face mask oxygen  Post-op Assessment: Report given to RN and Post -op Vital signs reviewed and stable  Post vital signs: Reviewed and stable  Last Vitals:  Vitals Value Taken Time  BP 163/89 09/15/2017  4:40 PM  Temp    Pulse 89 09/15/2017  4:43 PM  Resp 16 09/15/2017  4:43 PM  SpO2 100 % 09/15/2017  4:43 PM  Vitals shown include unvalidated device data.  Last Pain:  Vitals:   09/15/17 1319  TempSrc: Oral         Complications: No apparent anesthesia complications

## 2017-09-16 ENCOUNTER — Encounter (HOSPITAL_BASED_OUTPATIENT_CLINIC_OR_DEPARTMENT_OTHER): Payer: Self-pay | Admitting: General Surgery

## 2017-09-17 ENCOUNTER — Other Ambulatory Visit: Payer: Self-pay | Admitting: Adult Health

## 2017-09-17 DIAGNOSIS — F411 Generalized anxiety disorder: Secondary | ICD-10-CM

## 2017-09-21 NOTE — Progress Notes (Signed)
Please let patient know margins are negative, lymph nodes are negative, and the shoulder mole was NOT melanoma.

## 2017-10-26 ENCOUNTER — Other Ambulatory Visit: Payer: Self-pay | Admitting: Internal Medicine

## 2017-10-26 DIAGNOSIS — J069 Acute upper respiratory infection, unspecified: Secondary | ICD-10-CM

## 2017-11-04 NOTE — Progress Notes (Signed)
[facility] Unk Pinto, M.D.     Uvaldo Bristle. Silverio Lay, P.A.-C Liane Comber, [place] [address] [place], [place] [address] Telephone [phone] Telefax [phone] Annual Screening/Preventative Visit & Comprehensive Evaluation &  Examination     This very nice 57 y.o. MWF presents for a Screening /Preventative Visit & comprehensive evaluation and management of multiple medical co-morbidities.  Patient has been followed for HTN, HLD, Prediabetes  and Vitamin D Deficiency.     Patient had excision of a melanoma of the Rt arm in Sept by Dr Barry Dienes.       HTN predates circa 2014. Patient's BP has been controlled at home and patient denies any cardiac symptoms as chest pain, palpitations, shortness of breath, dizziness or ankle swelling. In Feb 2017 , she had a Normal Cardiac MRI by Dr Cathie Olden.  In Sept 2018, she had a negative Myoview. Today's BP is at goal - 140/82.      Patient's hyperlipidemia is controlled with diet and medications. Patient denies myalgias or other medication SE's. Last lipids were at goal albeit elevated Trig's: Lab Results  Component Value Date   CHOL 164 05/19/2017   HDL 40 (L) 05/19/2017   LDLCALC 97 05/19/2017   TRIG 168 (H) 05/19/2017   CHOLHDL 4.1 05/19/2017      Patient has hx/o prediabetes predating (A1c 6.0% / 2012)  and patient denies reactive hypoglycemic symptoms, visual blurring, diabetic polys or paresthesias. Last A1c was at goal: Lab Results  Component Value Date   HGBA1C 5.6 05/19/2017      Finally, patient has history of Vitamin D Deficiency ("48" on tx-2008) and last Vitamin D was at goal: Lab Results  Component Value Date   VD25OH 73 10/13/2016   Current Outpatient Medications on File Prior to Visit  Medication Sig  . Adapalene-Benzoyl Peroxide (EPIDUO) 0.1-2.5 % gel Apply 1 application topically as needed (acne).  Marland Kitchen albuterol (PROVENTIL HFA;VENTOLIN HFA) 108  (90 Base) MCG/ACT inhaler Inhale 2 puffs into the lungs every 4 (four) hours as needed for wheezing or shortness of breath.  Marland Kitchen aspirin EC 81 MG tablet Take 1 tablet (81 mg total) by mouth daily.  Marland Kitchen atorvastatin (LIPITOR) 40 MG tablet TAKE 1/2 TO 1 TABLET DAILY OR AS DIRECTED FOR CHOLESTEROL (Patient taking differently: Take 40 mg by mouth daily. )  . busPIRone (BUSPAR) 15 MG tablet Take 1/2 to 1 tablet 2 x/day for Anxiety (Patient taking differently: Take 15 mg by mouth at bedtime. )  . Cholecalciferol (VITAMIN D PO) Take 1,000 Units by mouth 2 (two) times daily.   Marland Kitchen escitalopram (LEXAPRO) 10 MG tablet TAKE 1 TABLET BY MOUTH EVERY DAY  . estradiol (ESTRACE) 0.5 MG tablet Take 0.5 mg by mouth daily.  . fluticasone (FLONASE) 50 MCG/ACT nasal spray USE 2 SPRAYS IN EACH NOSTRIL DAILY  . furosemide (LASIX) 40 MG tablet Take 1 tablet daily for BP & Fluid (Patient taking differently: Take 40 mg by mouth daily. Take 1 tablet daily for BP & Fluid)  . medroxyPROGESTERone (PROVERA) 2.5 MG tablet Take 2.5 mg by mouth daily.  . metoprolol succinate (TOPROL-XL) 100 MG 24 hr tablet TAKE 1/2 TO 1 TABLET DAILY AS DIRECTED FOR BP (Patient taking differently: Take 100 mg by mouth daily. )  . montelukast (SINGULAIR) 10 MG tablet TAKE 1 TABLET BY MOUTH EVERY DAY  . olmesartan (BENICAR) 40 MG tablet TAKE 1/2 TAB DAILY IF BP 140/90, TAKE FULL TAB (Patient taking differently:  Take 40 mg by mouth daily. )  . Omega-3 Fatty Acids (FISH OIL PO) Take 1 capsule by mouth daily.  . valACYclovir (VALTREX) 500 MG tablet Take 500 mg by mouth 2 (two) times daily as needed (fever blister outbreak).   No current facility-administered medications on file prior to visit.    No Known Allergies   Past Medical History:  Diagnosis Date  . Allergy   . Anxiety   . Asthma   . Cardiomyopathy ([place])    a. Echo 8/16:  EF 40-45%, apical and ant-septal HK, Gr 2 DD  . CHF (congestive heart failure) ([place])   . Diverticula, colon   . Family  history of adverse reaction to anesthesia    sisiter had PONV  . Headache    rare migraines  . History of cardiovascular stress test    Myoview 8/16:  EF 48%, anterior and apical defect c/w breast atten, no ischemia; Low Risk // Nuclear stress test 9/18: Low risk stress nuclear study with fixed septal defect consistent with LBBB vs prior infarct; no ischemia; EF 53 with septal akinesis.  . Hyperlipidemia   . Hypertension   . Irregular periods/menstrual cycles   . LBBB (left bundle branch block)   . Melanoma ([place])    right arm  . Seasonal allergies   . Stroke [facility])    TIA august 2016  . TIA (transient ischemic attack)   . Torn meniscus    Health Maintenance  Topic Date Due  . INFLUENZA VACCINE  08/12/2017  . PAP SMEAR  05/19/2019  . MAMMOGRAM  06/22/2019  . TETANUS/TDAP  01/08/2025  . COLONOSCOPY  07/13/2027  . Hepatitis C Screening  Completed  . HIV Screening  Completed   Immunization History  Administered Date(s) Administered  . Influenza Inj Mdck Quad With Preservative 10/13/2016  . Influenza Split 10/24/2013  . Influenza,inj,quad, With Preservative 12/24/2015  . PPD Test 06/13/2013, 09/13/2015, 10/13/2016  . Pneumococcal Polysaccharide-23 02/12/2004  . Td 10/12/2004  . Tdap 01/09/2015   Last Colon - 07/12/2017 - Dr Havery Moros - Hyperplastic polyp  Last MGM - 06/21/2017 - Negative  Past Surgical History:  Procedure Laterality Date  . ABLATION    . BREAST REDUCTION SURGERY    . CARDIAC CATHETERIZATION    . CESAREAN SECTION     x2  . KNEE SURGERY     partial knee replacement right  . MELANOMA EXCISION WITH SENTINEL LYMPH NODE BIOPSY Right 09/15/2017   Procedure: BIOPSY RIGHT SHOULDER MOLE AND WIDE LOCAL EXCISION OF RIGHT ARM MELANOMA, ADVANCEMENT FLAP CLOSURE, SENTINEL LYMPH NODE BIOPSY  RIGHT ARM MELANOMA;  Surgeon: Stark Klein, MD;  Location: [facility];  Service: General;  Laterality: Right;  . REPAIR QUADRICEPS / HAMSTRING MUSCLE    .  TONSILLECTOMY    . tubes in ears  10/12   Family History  Problem Relation Age of Onset  . Diabetes Mother   . Heart disease Mother   . Hypertension Mother   . Heart attack Mother 74  . Other Father        MVA  . Asthma Sister   . Asthma Son   . Cancer Maternal Grandmother        Uterine  . Heart attack Paternal Grandmother   . Colon cancer Neg Hx   . Colon polyps Neg Hx   . Esophageal cancer Neg Hx   . Stomach cancer Neg Hx   . Rectal cancer Neg Hx    Social History  Tobacco Use  . Smoking status: Never Smoker  . Smokeless tobacco: Never Used  Substance Use Topics  . Alcohol use: Yes    Comment: 1-2 glasses wine nightly  . Drug use: No    ROS Constitutional: Denies fever, chills, weight loss/gain, headaches, insomnia,  night sweats, and change in appetite. Does c/o fatigue. Eyes: Denies redness, blurred vision, diplopia, discharge, itchy, watery eyes.  ENT: Denies discharge, congestion, post nasal drip, epistaxis, sore throat, earache, hearing loss, dental pain, Tinnitus, Vertigo, Sinus pain, snoring.  Cardio: Denies chest pain, palpitations, irregular heartbeat, syncope, dyspnea, diaphoresis, orthopnea, PND, claudication, edema Respiratory: denies cough, dyspnea, DOE, pleurisy, hoarseness, laryngitis, wheezing.  Gastrointestinal: Denies dysphagia, heartburn, reflux, water brash, pain, cramps, nausea, vomiting, bloating, diarrhea, constipation, hematemesis, melena, hematochezia, jaundice, hemorrhoids Genitourinary: Denies dysuria, frequency, urgency, nocturia, hesitancy, discharge, hematuria, flank pain Breast: Breast lumps, nipple discharge, bleeding.  Musculoskeletal: Denies arthralgia, myalgia, stiffness, Jt. Swelling, pain, limp, and strain/sprain. Denies falls. Skin: Denies puritis, rash, hives, warts, acne, eczema, changing in skin lesion Neuro: No weakness, tremor, incoordination, spasms, paresthesia, pain Psychiatric: Denies confusion, memory loss, sensory loss.  Denies Depression. Endocrine: Denies change in weight, skin, hair change, nocturia, and paresthesia, diabetic polys, visual blurring, hyper / hypo glycemic episodes.  Heme/Lymph: No excessive bleeding, bruising, enlarged lymph nodes.  Physical Exam  BP 140/82   Pulse (!) 52   Temp (!) 97.5 F (36.4 C)   Resp 16   Ht 5\' 1"  (1.549 m)   Wt 143 lb 9.6 oz (65.1 kg)   BMI 27.13 kg/m   General Appearance: Well nourished, well groomed and in no apparent distress.  Eyes: PERRLA, EOMs, conjunctiva no swelling or erythema, normal fundi and vessels. Sinuses: No frontal/maxillary tenderness ENT/Mouth: EACs patent / TMs  nl. Nares clear without erythema, swelling, mucoid exudates. Oral hygiene is good. No erythema, swelling, or exudate. Tongue normal, non-obstructing. Tonsils not swollen or erythematous. Hearing normal.  Neck: Supple, thyroid not palpable. No bruits, nodes or JVD. Respiratory: Respiratory effort normal.  BS equal and clear bilateral without rales, rhonci, wheezing or stridor. Cardio: Heart sounds are normal with regular rate and rhythm and no murmurs, rubs or gallops. Peripheral pulses are normal and equal bilaterally without edema. No aortic or femoral bruits. Chest: symmetric with normal excursions and percussion. Breasts: Symmetric, without lumps, nipple discharge, retractions, or fibrocystic changes.  Abdomen: Flat, soft with bowel sounds active. Nontender, no guarding, rebound, hernias, masses, or organomegaly.  Lymphatics: Non tender without lymphadenopathy.  Genitourinary: Deferred to GYN.  Musculoskeletal: Full ROM all peripheral extremities, joint stability, 5/5 strength, and normal gait. Skin: Warm and dry without rashes, lesions, cyanosis, clubbing or  ecchymosis.  Neuro: Cranial nerves intact, reflexes equal bilaterally. Normal muscle tone, no cerebellar symptoms. Sensation intact.  Pysch: Alert and oriented X 3, normal affect, Insight and Judgment appropriate.    Assessment and Plan  1. Annual Preventative Screening Examination  2. Essential hypertension  - EKG 12-Lead - [place], RETROPERITNL ABD,  LTD - Urinalysis, Routine w reflex microscopic - Microalbumin / creatinine urine ratio - CBC with Differential/Platelet - COMPLETE METABOLIC PANEL WITH GFR - Magnesium - TSH  3. Hyperlipidemia, mixed  - EKG 12-Lead - [place], RETROPERITNL ABD,  LTD - Lipid panel  4. Prediabetes  - EKG 12-Lead - [place], RETROPERITNL ABD,  LTD - Hemoglobin A1c - Insulin, random  5. Vitamin D deficiency  - VITAMIN D 25 Hydroxyl  6. Screening for ischemic heart disease  - EKG 12-Lead - Lipid panel  7. FHx: heart disease - EKG 12-Lead - [place], RETROPERITNL ABD,  LTD  8. Screening for AAA (aortic abdominal aneurysm)  - [place], RETROPERITNL ABD,  LTD  9. Screening for colorectal cance  10. Screening examination for pulmonary tuberculosis  - PPD  11. Need for prophylactic vaccination and inoculation against influenza  - FLU VACCINE MDCK QUAD W/Preservative  12. Fatigue  - Iron,Total/Total Iron Binding Cap - Vitamin B12 - CBC with Differential/Platelet  13. Medication management  - Urinalysis, Routine w reflex microscopic - Microalbumin / creatinine urine ratio - CBC with Differential/Platelet - COMPLETE METABOLIC PANEL WITH GFR - Magnesium - Lipid panel - TSH - Hemoglobin A1c - Insulin, random - VITAMIN D 25 Hydroxyl               Patient was counseled in prudent diet to achieve/maintain BMI less than 25 for weight control, BP monitoring, regular exercise and medications. Discussed med's effects and SE's. Screening labs and tests as requested with regular follow-up as recommended. Over 40 minutes of exam, counseling, chart review and high complex critical decision making was performed.

## 2017-11-05 ENCOUNTER — Ambulatory Visit: Payer: BLUE CROSS/BLUE SHIELD | Admitting: Internal Medicine

## 2017-11-05 ENCOUNTER — Encounter: Payer: Self-pay | Admitting: Internal Medicine

## 2017-11-05 VITALS — BP 140/82 | HR 52 | Temp 97.5°F | Resp 16 | Ht 61.0 in | Wt 143.6 lb

## 2017-11-05 DIAGNOSIS — I10 Essential (primary) hypertension: Secondary | ICD-10-CM | POA: Diagnosis not present

## 2017-11-05 DIAGNOSIS — Z1389 Encounter for screening for other disorder: Secondary | ICD-10-CM | POA: Diagnosis not present

## 2017-11-05 DIAGNOSIS — Z131 Encounter for screening for diabetes mellitus: Secondary | ICD-10-CM

## 2017-11-05 DIAGNOSIS — Z0000 Encounter for general adult medical examination without abnormal findings: Secondary | ICD-10-CM

## 2017-11-05 DIAGNOSIS — Z23 Encounter for immunization: Secondary | ICD-10-CM

## 2017-11-05 DIAGNOSIS — Z136 Encounter for screening for cardiovascular disorders: Secondary | ICD-10-CM | POA: Diagnosis not present

## 2017-11-05 DIAGNOSIS — Z1211 Encounter for screening for malignant neoplasm of colon: Secondary | ICD-10-CM

## 2017-11-05 DIAGNOSIS — Z111 Encounter for screening for respiratory tuberculosis: Secondary | ICD-10-CM | POA: Diagnosis not present

## 2017-11-05 DIAGNOSIS — E782 Mixed hyperlipidemia: Secondary | ICD-10-CM

## 2017-11-05 DIAGNOSIS — Z1212 Encounter for screening for malignant neoplasm of rectum: Secondary | ICD-10-CM

## 2017-11-05 DIAGNOSIS — E559 Vitamin D deficiency, unspecified: Secondary | ICD-10-CM

## 2017-11-05 DIAGNOSIS — Z8249 Family history of ischemic heart disease and other diseases of the circulatory system: Secondary | ICD-10-CM

## 2017-11-05 DIAGNOSIS — Z130 Encounter for screening for diseases of the blood and blood-forming organs and certain disorders involving the immune mechanism: Secondary | ICD-10-CM | POA: Diagnosis not present

## 2017-11-05 DIAGNOSIS — Z13220 Encounter for screening for lipoid disorders: Secondary | ICD-10-CM | POA: Diagnosis not present

## 2017-11-05 DIAGNOSIS — R7303 Prediabetes: Secondary | ICD-10-CM

## 2017-11-05 DIAGNOSIS — R5383 Other fatigue: Secondary | ICD-10-CM

## 2017-11-05 DIAGNOSIS — Z1329 Encounter for screening for other suspected endocrine disorder: Secondary | ICD-10-CM | POA: Diagnosis not present

## 2017-11-05 DIAGNOSIS — Z79899 Other long term (current) drug therapy: Secondary | ICD-10-CM

## 2017-11-05 DIAGNOSIS — Z0001 Encounter for general adult medical examination with abnormal findings: Secondary | ICD-10-CM

## 2017-11-05 NOTE — Patient Instructions (Signed)

## 2017-11-07 ENCOUNTER — Encounter: Payer: Self-pay | Admitting: Internal Medicine

## 2017-11-08 LAB — URINALYSIS, ROUTINE W REFLEX MICROSCOPIC
BILIRUBIN URINE: NEGATIVE
GLUCOSE, UA: NEGATIVE
Hgb urine dipstick: NEGATIVE
KETONES UR: NEGATIVE
Leukocytes, UA: NEGATIVE
Nitrite: NEGATIVE
Protein, ur: NEGATIVE
SPECIFIC GRAVITY, URINE: 1.006 (ref 1.001–1.03)
pH: 5.5 (ref 5.0–8.0)

## 2017-11-08 LAB — CBC WITH DIFFERENTIAL/PLATELET
Basophils Absolute: 69 cells/uL (ref 0–200)
Basophils Relative: 0.9 %
EOS ABS: 208 {cells}/uL (ref 15–500)
Eosinophils Relative: 2.7 %
HEMATOCRIT: 46.1 % — AB (ref 35.0–45.0)
Hemoglobin: 15.4 g/dL (ref 11.7–15.5)
LYMPHS ABS: 2433 {cells}/uL (ref 850–3900)
MCH: 31.2 pg (ref 27.0–33.0)
MCHC: 33.4 g/dL (ref 32.0–36.0)
MCV: 93.3 fL (ref 80.0–100.0)
MPV: 12.2 fL (ref 7.5–12.5)
Monocytes Relative: 6.2 %
NEUTROS PCT: 58.6 %
Neutro Abs: 4512 cells/uL (ref 1500–7800)
Platelets: 326 10*3/uL (ref 140–400)
RBC: 4.94 10*6/uL (ref 3.80–5.10)
RDW: 13.1 % (ref 11.0–15.0)
Total Lymphocyte: 31.6 %
WBC: 7.7 10*3/uL (ref 3.8–10.8)
WBCMIX: 477 {cells}/uL (ref 200–950)

## 2017-11-08 LAB — COMPLETE METABOLIC PANEL WITH GFR
AG RATIO: 1.8 (calc) (ref 1.0–2.5)
ALKALINE PHOSPHATASE (APISO): 85 U/L (ref 33–130)
ALT: 18 U/L (ref 6–29)
AST: 16 U/L (ref 10–35)
Albumin: 4.5 g/dL (ref 3.6–5.1)
BILIRUBIN TOTAL: 0.5 mg/dL (ref 0.2–1.2)
BUN: 17 mg/dL (ref 7–25)
CHLORIDE: 102 mmol/L (ref 98–110)
CO2: 27 mmol/L (ref 20–32)
Calcium: 9.1 mg/dL (ref 8.6–10.4)
Creat: 0.84 mg/dL (ref 0.50–1.05)
GFR, EST AFRICAN AMERICAN: 90 mL/min/{1.73_m2} (ref >=60)
GFR, EST NON AFRICAN AMERICAN: 78 mL/min/{1.73_m2} (ref >=60)
GLOBULIN: 2.5 g/dL (ref 1.9–3.7)
Glucose, Bld: 106 mg/dL — ABNORMAL HIGH (ref 65–99)
POTASSIUM: 4.2 mmol/L (ref 3.5–5.3)
Sodium: 138 mmol/L (ref 135–146)
Total Protein: 7 g/dL (ref 6.1–8.1)

## 2017-11-08 LAB — MICROALBUMIN / CREATININE URINE RATIO
Creatinine, Urine: 14 mg/dL — ABNORMAL LOW (ref 20–275)
MICROALB UR: 0.2 mg/dL
MICROALB/CREAT RATIO: 14 ug/mg{creat} (ref <30)

## 2017-11-08 LAB — LIPID PANEL
Cholesterol: 190 mg/dL (ref <200)
HDL: 38 mg/dL — ABNORMAL LOW (ref >50)
NON-HDL CHOLESTEROL (CALC): 152 mg/dL — AB (ref <130)
Total CHOL/HDL Ratio: 5 (calc) — ABNORMAL HIGH (ref <5.0)
Triglycerides: 460 mg/dL — ABNORMAL HIGH (ref <150)

## 2017-11-08 LAB — IRON, TOTAL/TOTAL IRON BINDING CAP
%SAT: 24 % (ref 16–45)
Iron: 77 ug/dL (ref 45–160)
TIBC: 316 ug/dL (ref 250–450)

## 2017-11-08 LAB — MAGNESIUM: Magnesium: 2 mg/dL (ref 1.5–2.5)

## 2017-11-08 LAB — VITAMIN B12: Vitamin B-12: 625 pg/mL (ref 200–1100)

## 2017-11-08 LAB — VITAMIN D 25 HYDROXY (VIT D DEFICIENCY, FRACTURES): Vit D, 25-Hydroxy: 41 ng/mL (ref 30–100)

## 2017-11-08 LAB — HEMOGLOBIN A1C
EAG (MMOL/L): 6.3 (calc)
HEMOGLOBIN A1C: 5.6 %{Hb} (ref <5.7)
MEAN PLASMA GLUCOSE: 114 (calc)

## 2017-11-08 LAB — TSH: TSH: 0.89 mIU/L (ref 0.40–4.50)

## 2017-11-08 LAB — INSULIN, RANDOM: INSULIN: 7.4 u[IU]/mL (ref 2.0–19.6)

## 2017-11-15 ENCOUNTER — Other Ambulatory Visit: Payer: Self-pay | Admitting: Internal Medicine

## 2017-11-15 MED ORDER — GABAPENTIN 100 MG PO CAPS: ORAL_CAPSULE | ORAL | 1 refills | Status: DC

## 2017-12-08 ENCOUNTER — Other Ambulatory Visit: Payer: Self-pay | Admitting: Internal Medicine

## 2017-12-23 ENCOUNTER — Ambulatory Visit: Payer: BLUE CROSS/BLUE SHIELD | Admitting: Adult Health

## 2017-12-23 ENCOUNTER — Encounter: Payer: Self-pay | Admitting: Adult Health

## 2017-12-23 VITALS — BP 130/80 | HR 52 | Temp 97.5°F | Ht 61.0 in | Wt 146.6 lb

## 2017-12-23 DIAGNOSIS — G44209 Tension-type headache, unspecified, not intractable: Secondary | ICD-10-CM

## 2017-12-23 DIAGNOSIS — J060 Acute laryngopharyngitis: Secondary | ICD-10-CM | POA: Diagnosis not present

## 2017-12-23 LAB — TB SKIN TEST
INDURATION: 0 mm
TB Skin Test: NEGATIVE

## 2017-12-23 MED ORDER — PREDNISONE 20 MG PO TABS: ORAL_TABLET | ORAL | 0 refills | Status: DC

## 2017-12-23 MED ORDER — AZITHROMYCIN 250 MG PO TABS: ORAL_TABLET | ORAL | 1 refills | Status: AC

## 2017-12-23 NOTE — Patient Instructions (Signed)

## 2017-12-23 NOTE — Progress Notes (Signed)
Assessment and Plan:  Arlethia was seen today for headache and sore throat.  Diagnoses and all orders for this visit:  Laryngopharyngitis    Pharyngitis: take medicationss as prescribed, increase fluids,  Salt water gargles. If symptoms do not improve in 5-7 days or get worse contact the office or go to ER. -     predniSONE (DELTASONE) 20 MG tablet; 2 tablets daily for 3 days, 1 tablet daily for 4 days.  Tension headache Tension-type headache, episodic- Lie in darkened room and apply cold packs as needed for pain. Side effect profile discussed in detail. Patient reassured that neurodiagnostic workup not indicated from benign H&P. Stress management, moist heat to neck muscles, consider medical massage, can refer to PT for dry needling if persistant  Other orders Hold and take only if pharyngitis progressive and not improving with conservative interventions -     azithromycin (ZITHROMAX) 250 MG tablet; Take 2 tablets (500 mg) on  Day 1,  followed by 1 tablet (250 mg) once daily on Days 2 through 5.  Further disposition pending results of labs. Discussed med's effects and SE's.   Over 15 minutes of exam, counseling, chart review, and critical decision making was performed.   Future Appointments  Date Time Provider [place]  02/07/2018 10:30 AM Garnet Sierras, NP [facility] None  05/19/2018 10:30 AM Unk Pinto, MD [facility] None  11/24/2018  9:00 AM Unk Pinto, MD [facility] None    ------------------------------------------------------------------------------------------------------------------   HPI BP 130/80   Pulse (!) 52   Temp (!) 97.5 F (36.4 C)   Ht 5\' 1"  (1.549 m)   Wt 146 lb 9.6 oz (66.5 kg)   SpO2 98%   BMI 27.70 kg/m   57 y.o.female with hx of htn, systolic CHF, asthma, presents for evaluation of mild frontal/bilateral temporal "band-like" headache x 1 weeks, worse later in day, bilateral ear pressure, new sore throat and hoarseness since  yesterday. Denies nasal congestion. Denies vision changes, dizziness, sinus pressure, neck stiffness (does have some muscular tenderness/achy). Denies fever/chills, nauea/vomiting.   She takes singulair daily, no recent problems with allergies or asthma.   She has been using flonase, zyrtec, aleve/ibuproen for pain.   [facility], recommended   She admits work has been very stressful, wonders if headache could be stress. Hasn't imrpved with OTC analgesics.   Past Medical History:  Diagnosis Date  . Allergy   . Anxiety   . Asthma   . Cardiomyopathy ([place])    a. Echo 8/16:  EF 40-45%, apical and ant-septal HK, Gr 2 DD  . CHF (congestive heart failure) ([place])   . Diverticula, colon   . Family history of adverse reaction to anesthesia    sisiter had PONV  . Headache    rare migraines  . History of cardiovascular stress test    Myoview 8/16:  EF 48%, anterior and apical defect c/w breast atten, no ischemia; Low Risk // Nuclear stress test 9/18: Low risk stress nuclear study with fixed septal defect consistent with LBBB vs prior infarct; no ischemia; EF 53 with septal akinesis.  . Hyperlipidemia   . Hypertension   . Irregular periods/menstrual cycles   . LBBB (left bundle branch block)   . Melanoma ([place])    right arm  . Seasonal allergies   . Stroke [facility]    TIA august 2016  . TIA (transient ischemic attack)   . Torn meniscus      No Known Allergies  Current Outpatient Medications on File Prior to Visit  Medication Sig  . albuterol (PROVENTIL HFA;VENTOLIN HFA) 108 (90 Base) MCG/ACT inhaler Inhale 2 puffs into the lungs every 4 (four) hours as needed for wheezing or shortness of breath.  Marland Kitchen aspirin EC 81 MG tablet Take 1 tablet (81 mg total) by mouth daily.  Marland Kitchen atorvastatin (LIPITOR) 40 MG tablet TAKE 1/2 TO 1 TABLET DAILY OR AS DIRECTED FOR CHOLESTEROL (Patient taking differently: Take 40 mg by mouth daily. )  . busPIRone (BUSPAR) 15 MG tablet  Take 1/2 to 1 tablet 2 x/day for Anxiety (Patient taking differently: Take 15 mg by mouth daily. Takes 1 tablet by mouth daily)  . Cholecalciferol (VITAMIN D PO) Take 1,000 Units by mouth 2 (two) times daily.   Marland Kitchen escitalopram (LEXAPRO) 10 MG tablet TAKE 1 TABLET BY MOUTH EVERY DAY  . estradiol (ESTRACE) 0.5 MG tablet Take 0.5 mg by mouth daily.  . fluticasone (FLONASE) 50 MCG/ACT nasal spray USE 2 SPRAYS IN EACH NOSTRIL DAILY  . furosemide (LASIX) 40 MG tablet Take 1 tablet daily for BP & Fluid (Patient taking differently: Take 40 mg by mouth daily. Take 1 tablet daily for BP & Fluid)  . gabapentin (NEURONTIN) 100 MG capsule TAKE 1 TO 3 CAPSULES BY MOUTH 2-3 TIMES DAILY AS DIRECTED (Patient taking differently: TAKE 2 CAPSULES BY MOUTH AT BEDTIME)  . medroxyPROGESTERone (PROVERA) 2.5 MG tablet Take 2.5 mg by mouth daily.  . metoprolol succinate (TOPROL-XL) 100 MG 24 hr tablet TAKE 1/2 TO 1 TABLET DAILY AS DIRECTED FOR BP (Patient taking differently: Take 100 mg by mouth daily. )  . montelukast (SINGULAIR) 10 MG tablet TAKE 1 TABLET BY MOUTH EVERY DAY  . olmesartan (BENICAR) 40 MG tablet TAKE 1/2 TAB DAILY IF BP 140/90, TAKE FULL TAB (Patient taking differently: Take 40 mg by mouth daily. )  . Omega-3 Fatty Acids (FISH OIL PO) Take 1 capsule by mouth daily.  . valACYclovir (VALTREX) 500 MG tablet Take 500 mg by mouth 2 (two) times daily as needed (fever blister outbreak).  . Adapalene-Benzoyl Peroxide (EPIDUO) 0.1-2.5 % gel Apply 1 application topically as needed (acne). (Patient not taking: Reported on 12/23/2017)   No current facility-administered medications on file prior to visit.     ROS: all negative except above.   Physical Exam:  BP 130/80   Pulse (!) 52   Temp (!) 97.5 F (36.4 C)   Ht 5\' 1"  (1.549 m)   Wt 146 lb 9.6 oz (66.5 kg)   SpO2 98%   BMI 27.70 kg/m   General Appearance: Well nourished, in no apparent distress. Eyes: PERRLA, conjunctiva no swelling or  erythema Sinuses: No Frontal/maxillary tenderness ENT/Mouth: Ext aud canals clear, TMs without erythema, bulging. No erythema, swelling, or exudate on post pharynx.  Tonsils not swollen or erythematous. Hearing normal.  Neck: Supple, thyroid normal.  Respiratory: Respiratory effort normal, BS equal bilaterally without rales, rhonchi, wheezing or stridor.  Cardio: RRR with no MRGs. Brisk peripheral pulses without edema.  Abdomen: Soft, + BS.  Non tender, no guarding, rebound, hernias, masses. Lymphatics: Non tender without lymphadenopathy.  Musculoskeletal: Full ROM, symmetrical strength, normal gait. She has bilateral trapezial muscular tenderness.  Skin: Warm, dry without rashes, lesions, ecchymosis.  Neuro: Cranial nerves intact. Normal muscle tone, no cerebellar symptoms.  Psych: Awake and oriented X 3, normal affect, Insight and Judgment appropriate.     Izora Ribas, NP 3:46 PM [facility]

## 2017-12-24 ENCOUNTER — Ambulatory Visit: Payer: Self-pay | Admitting: Adult Health

## 2018-01-03 ENCOUNTER — Other Ambulatory Visit: Payer: Self-pay | Admitting: Internal Medicine

## 2018-01-03 ENCOUNTER — Ambulatory Visit: Payer: BLUE CROSS/BLUE SHIELD | Admitting: Adult Health Nurse Practitioner

## 2018-01-03 VITALS — BP 146/86 | HR 56 | Temp 97.5°F | Resp 16 | Ht 61.0 in | Wt 148.6 lb

## 2018-01-03 DIAGNOSIS — Z79899 Other long term (current) drug therapy: Secondary | ICD-10-CM

## 2018-01-03 DIAGNOSIS — R0989 Other specified symptoms and signs involving the circulatory and respiratory systems: Secondary | ICD-10-CM

## 2018-01-03 DIAGNOSIS — I10 Essential (primary) hypertension: Secondary | ICD-10-CM

## 2018-01-03 DIAGNOSIS — R079 Chest pain, unspecified: Secondary | ICD-10-CM

## 2018-01-03 DIAGNOSIS — R519 Headache, unspecified: Secondary | ICD-10-CM

## 2018-01-03 DIAGNOSIS — E782 Mixed hyperlipidemia: Secondary | ICD-10-CM | POA: Diagnosis not present

## 2018-01-03 DIAGNOSIS — R51 Headache: Secondary | ICD-10-CM | POA: Diagnosis not present

## 2018-01-03 MED ORDER — AMLODIPINE BESYLATE 10 MG PO TABS: ORAL_TABLET | ORAL | 3 refills | Status: DC

## 2018-01-03 NOTE — Patient Instructions (Signed)
Start Norvasc 5mg  tablet every night . I will send in 10mg  tablets, half them.  Monitor blood pressure twice a day and follow up in one week.    Hypertension Hypertension, commonly called high blood pressure, is when the force of blood pumping through the arteries is too strong. The arteries are the blood vessels that carry blood from the heart throughout the body. Hypertension forces the heart to work harder to pump blood and may cause arteries to become narrow or stiff. Having untreated or uncontrolled hypertension can cause heart attacks, strokes, kidney disease, and other problems. A blood pressure reading consists of a higher number over a lower number. Ideally, your blood pressure should be below 120/80. The first ("top") number is called the systolic pressure. It is a measure of the pressure in your arteries as your heart beats. The second ("bottom") number is called the diastolic pressure. It is a measure of the pressure in your arteries as the heart relaxes. What are the causes? The cause of this condition is not known. What increases the risk? Some risk factors for high blood pressure are under your control. Others are not. Factors you can change  Smoking.  Having type 2 diabetes mellitus, high cholesterol, or both.  Not getting enough exercise or physical activity.  Being overweight.  Having too much fat, sugar, calories, or salt (sodium) in your diet.  Drinking too much alcohol. Factors that are difficult or impossible to change  Having chronic kidney disease.  Having a family history of high blood pressure.  Age. Risk increases with age.  Race. You may be at higher risk if you are African-American.  Gender. Men are at higher risk than women before age 41. After age 58, women are at higher risk than men.  Having obstructive sleep apnea.  Stress. What are the signs or symptoms? Extremely high blood pressure (hypertensive crisis) may  cause:  Headache.  Anxiety.  Shortness of breath.  Nosebleed.  Nausea and vomiting.  Severe chest pain.  Jerky movements you cannot control (seizures). How is this diagnosed? This condition is diagnosed by measuring your blood pressure while you are seated, with your arm resting on a surface. The cuff of the blood pressure monitor will be placed directly against the skin of your upper arm at the level of your heart. It should be measured at least twice using the same arm. Certain conditions can cause a difference in blood pressure between your right and left arms. Certain factors can cause blood pressure readings to be lower or higher than normal (elevated) for a short period of time:  When your blood pressure is higher when you are in a health care provider's office than when you are at home, this is called white coat hypertension. Most people with this condition do not need medicines.  When your blood pressure is higher at home than when you are in a health care provider's office, this is called masked hypertension. Most people with this condition may need medicines to control blood pressure. If you have a high blood pressure reading during one visit or you have normal blood pressure with other risk factors:  You may be asked to return on a different day to have your blood pressure checked again.  You may be asked to monitor your blood pressure at home for 1 week or longer. If you are diagnosed with hypertension, you may have other blood or imaging tests to help your health care provider understand your overall risk for  other conditions. How is this treated? This condition is treated by making healthy lifestyle changes, such as eating healthy foods, exercising more, and reducing your alcohol intake. Your health care provider may prescribe medicine if lifestyle changes are not enough to get your blood pressure under control, and if:  Your systolic blood pressure is above 130.  Your  diastolic blood pressure is above 80. Your personal target blood pressure may vary depending on your medical conditions, your age, and other factors. Follow these instructions at home: Eating and drinking   Eat a diet that is high in fiber and potassium, and low in sodium, added sugar, and fat. An example eating plan is called the DASH (Dietary Approaches to Stop Hypertension) diet. To eat this way: ? Eat plenty of fresh fruits and vegetables. Try to fill half of your plate at each meal with fruits and vegetables. ? Eat whole grains, such as whole wheat pasta, brown rice, or whole grain bread. Fill about one quarter of your plate with whole grains. ? Eat or drink low-fat dairy products, such as skim milk or low-fat yogurt. ? Avoid fatty cuts of meat, processed or cured meats, and poultry with skin. Fill about one quarter of your plate with lean proteins, such as fish, chicken without skin, beans, eggs, and tofu. ? Avoid premade and processed foods. These tend to be higher in sodium, added sugar, and fat.  Reduce your daily sodium intake. Most people with hypertension should eat less than 1,500 mg of sodium a day.  Limit alcohol intake to no more than 1 drink a day for nonpregnant women and 2 drinks a day for men. One drink equals 12 oz of beer, 5 oz of wine, or 1 oz of hard liquor. Lifestyle   Work with your health care provider to maintain a healthy body weight or to lose weight. Ask what an ideal weight is for you.  Get at least 30 minutes of exercise that causes your heart to beat faster (aerobic exercise) most days of the week. Activities may include walking, swimming, or biking.  Include exercise to strengthen your muscles (resistance exercise), such as pilates or lifting weights, as part of your weekly exercise routine. Try to do these types of exercises for 30 minutes at least 3 days a week.  Do not use any products that contain nicotine or tobacco, such as cigarettes and  e-cigarettes. If you need help quitting, ask your health care provider.  Monitor your blood pressure at home as told by your health care provider.  Keep all follow-up visits as told by your health care provider. This is important. Medicines  Take over-the-counter and prescription medicines only as told by your health care provider. Follow directions carefully. Blood pressure medicines must be taken as prescribed.  Do not skip doses of blood pressure medicine. Doing this puts you at risk for problems and can make the medicine less effective.  Ask your health care provider about side effects or reactions to medicines that you should watch for. Contact a health care provider if:  You think you are having a reaction to a medicine you are taking.  You have headaches that keep coming back (recurring).  You feel dizzy.  You have swelling in your ankles.  You have trouble with your vision. Get help right away if:  You develop a severe headache or confusion.  You have unusual weakness or numbness.  You feel faint.  You have severe pain in your chest or abdomen.  You vomit repeatedly.  You have trouble breathing. Summary  Hypertension is when the force of blood pumping through your arteries is too strong. If this condition is not controlled, it may put you at risk for serious complications.  Your personal target blood pressure may vary depending on your medical conditions, your age, and other factors. For most people, a normal blood pressure is less than 120/80.  Hypertension is treated with lifestyle changes, medicines, or a combination of both. Lifestyle changes include weight loss, eating a healthy, low-sodium diet, exercising more, and limiting alcohol. This information is not intended to replace advice given to you by your health care provider. Make sure you discuss any questions you have with your health care provider. Document Released: 12/29/2004 Document Revised: 11/27/2015  Document Reviewed: 11/27/2015 Elsevier Interactive Patient Education  2019 [employer].

## 2018-01-03 NOTE — Progress Notes (Signed)
Assessment and Plan:   Gail Bailey was seen today for hypertension and headache.  Diagnoses and all orders for this visit:  Chest pain, unspecified type -     EKG 12-Lead -     CBC with Differential/Platelet -     COMPLETE METABOLIC PANEL WITH GFR -     Magnesium -     Lipid panel -     D-dimer, quantitative (not at [facility])  Labile hypertension Rx Norvasc 5mg  nightly Discussed medication and side effects If blood pressure not decreasing take 10mg   Monitor blood pressure twice a day and record -Follow up in one week for blood pressure management. -Call or return with new or worsening symptoms  -     EKG 12-Lead -     CBC with Differential/Platelet -     COMPLETE METABOLIC PANEL WITH GFR -     Magnesium  Generalized headaches Likely from blood pressure elevation Monitor this [place] hospital precautions    EKG CBC, CMP, D Dimer Norvasc 5mg  nightly    Further disposition pending results of labs. Discussed med's effects and SE's.   Over 30 minutes of exam, counseling, chart review, and critical decision making was performed.   Future Appointments  Date Time Provider [place]  01/11/2018  9:30 AM Garnet Sierras, NP [facility] None  02/07/2018 10:30 AM Garnet Sierras, NP [facility] None  05/19/2018 10:30 AM Unk Pinto, MD [facility] None  11/24/2018  9:00 AM Unk Pinto, MD [facility] None    ------------------------------------------------------------------------------------------------------------------   HPI 57 y.o.female presents for elevated blood pressure and headaches.  Reports that her blood pressure has been 170's to 150's over 89-105 range.  She has been feeling bad with some chest tightness. Moving around makes it worse and laying down makes it better.  She reports there were some days that she did not want to get out of bed related to the fatigue.  She denies any edema, shortness of breath.  Reports that she just doesn't feel right.        Past Medical History:  Diagnosis Date  . Allergy   . Anxiety   . Asthma   . Cardiomyopathy ([place])    a. Echo 8/16:  EF 40-45%, apical and ant-septal HK, Gr 2 DD  . CHF (congestive heart failure) ([place])   . Diverticula, colon   . Family history of adverse reaction to anesthesia    sisiter had PONV  . Headache    rare migraines  . History of cardiovascular stress test    Myoview 8/16:  EF 48%, anterior and apical defect c/w breast atten, no ischemia; Low Risk // Nuclear stress test 9/18: Low risk stress nuclear study with fixed septal defect consistent with LBBB vs prior infarct; no ischemia; EF 53 with septal akinesis.  . Hyperlipidemia   . Hypertension   . Irregular periods/menstrual cycles   . LBBB (left bundle branch block)   . Melanoma ([place])    right arm  . Seasonal allergies   . Stroke [facility])    TIA august 2016  . TIA (transient ischemic attack)   . Torn meniscus      No Known Allergies  Current Outpatient Medications on File Prior to Visit  Medication Sig  . Adapalene-Benzoyl Peroxide (EPIDUO) 0.1-2.5 % gel Apply 1 application topically as needed (acne).  Marland Kitchen albuterol (PROVENTIL HFA;VENTOLIN HFA) 108 (90 Base) MCG/ACT inhaler Inhale 2 puffs into the lungs every 4 (four) hours as needed for wheezing or shortness of breath.  Marland Kitchen aspirin EC  81 MG tablet Take 1 tablet (81 mg total) by mouth daily.  Marland Kitchen atorvastatin (LIPITOR) 40 MG tablet TAKE 1/2 TO 1 TABLET DAILY OR AS DIRECTED FOR CHOLESTEROL (Patient taking differently: Take 40 mg by mouth daily. )  . busPIRone (BUSPAR) 15 MG tablet Take 1/2 to 1 tablet 2 x/day for Anxiety (Patient taking differently: Take 15 mg by mouth daily. Takes 1 tablet by mouth daily)  . Cholecalciferol (VITAMIN D PO) Take 1,000 Units by mouth 2 (two) times daily.   Marland Kitchen escitalopram (LEXAPRO) 10 MG tablet TAKE 1 TABLET BY MOUTH EVERY DAY  . estradiol (ESTRACE) 0.5 MG tablet Take 0.5 mg by mouth daily.  . fluticasone (FLONASE) 50 MCG/ACT nasal spray USE 2  SPRAYS IN EACH NOSTRIL DAILY  . furosemide (LASIX) 40 MG tablet Take 1 tablet daily for BP & Fluid (Patient taking differently: Take 40 mg by mouth daily. Take 1 tablet daily for BP & Fluid)  . gabapentin (NEURONTIN) 100 MG capsule TAKE 1 TO 3 CAPSULES BY MOUTH 2-3 TIMES DAILY AS DIRECTED (Patient taking differently: TAKE 2 CAPSULES BY MOUTH AT BEDTIME)  . medroxyPROGESTERone (PROVERA) 2.5 MG tablet Take 2.5 mg by mouth daily.  . metoprolol succinate (TOPROL-XL) 100 MG 24 hr tablet TAKE 1/2 TO 1 TABLET DAILY AS DIRECTED FOR BP (Patient taking differently: Take 100 mg by mouth daily. )  . montelukast (SINGULAIR) 10 MG tablet TAKE 1 TABLET BY MOUTH EVERY DAY  . olmesartan (BENICAR) 40 MG tablet TAKE 1/2 TAB DAILY IF BP 140/90, TAKE FULL TAB (Patient taking differently: Take 40 mg by mouth daily. )  . Omega-3 Fatty Acids (FISH OIL PO) Take 1 capsule by mouth daily.  . valACYclovir (VALTREX) 500 MG tablet Take 500 mg by mouth 2 (two) times daily as needed (fever blister outbreak).   No current facility-administered medications on file prior to visit.     ROS: Review of Systems  Constitutional: Positive for malaise/fatigue. Negative for chills, diaphoresis, fever and weight loss.  HENT: Negative for congestion, ear discharge, ear pain, hearing loss, nosebleeds, sinus pain, sore throat and tinnitus.   Eyes: Negative for blurred vision, double vision, photophobia, pain, discharge and redness.  Respiratory: Negative for cough, hemoptysis, sputum production, shortness of breath, wheezing and stridor.   Cardiovascular: Positive for chest pain. Negative for palpitations, orthopnea, claudication, leg swelling and PND.  Gastrointestinal: Negative for abdominal pain, blood in stool, constipation, diarrhea, heartburn, melena, nausea and vomiting.  Genitourinary: Negative for dysuria, flank pain, frequency, hematuria and urgency.  Musculoskeletal: Negative for back pain, falls, joint pain, myalgias and neck  pain.  Skin: Negative for itching and rash.  Neurological: Positive for headaches. Negative for dizziness, tingling, tremors, sensory change, speech change, focal weakness, seizures, loss of consciousness and weakness.  Psychiatric/Behavioral: Negative for depression, hallucinations, memory loss, substance abuse and suicidal ideas. The patient is not nervous/anxious and does not have insomnia.     Physical Exam:  BP (!) 146/86   Pulse (!) 56   Temp (!) 97.5 F (36.4 C)   Resp 16   Ht 5\' 1"  (1.549 m)   Wt 148 lb 9.6 oz (67.4 kg)   BMI 28.08 kg/m   General Appearance: Well nourished, in no apparent distress. Eyes: PERRLA, EOMs, conjunctiva no swelling or erythema Sinuses: No Frontal/maxillary tenderness ENT/Mouth: Ext aud canals clear, TMs without erythema, bulging. No erythema, swelling, or exudate on post pharynx.  Tonsils not swollen or erythematous. Hearing normal.  Neck: Supple, thyroid normal.  Respiratory: Respiratory  effort normal, BS equal bilaterally without rales, rhonchi, wheezing or stridor.  Cardio: RRR with no MRGs. Brisk peripheral pulses without edema.  Abdomen: Soft, + BS.  Non tender, no guarding, rebound, hernias, masses. Lymphatics: Non tender without lymphadenopathy.  Musculoskeletal: Full ROM, 5/5 strength, normal gait.  Skin: Warm, dry without rashes, lesions, ecchymosis.  Neuro: Cranial nerves intact. Normal muscle tone, no cerebellar symptoms. Sensation intact.  Psych: Awake and oriented X 3, normal affect, Insight and Judgment appropriate.     EKG:   NSR, comparative to last EKG.   Garnet Sierras, NP 10:58 PM [facility]

## 2018-01-04 LAB — COMPLETE METABOLIC PANEL WITH GFR
AG Ratio: 1.8 (calc) (ref 1.0–2.5)
ALT: 15 U/L (ref 6–29)
AST: 14 U/L (ref 10–35)
Albumin: 4.1 g/dL (ref 3.6–5.1)
Alkaline phosphatase (APISO): 70 U/L (ref 33–130)
BUN: 17 mg/dL (ref 7–25)
CO2: 31 mmol/L (ref 20–32)
Calcium: 9.5 mg/dL (ref 8.6–10.4)
Creat: 0.77 mg/dL (ref 0.50–1.05)
GFR, Est African American: 99 mL/min/{1.73_m2} (ref >=60)
GFR, Est Non African American: 86 mL/min/{1.73_m2} (ref >=60)
Glucose, Bld: 102 mg/dL — ABNORMAL HIGH (ref 65–99)
Potassium: 3.9 mmol/L (ref 3.5–5.3)
Sodium: 139 mmol/L (ref 135–146)
Total Protein: 6.4 g/dL (ref 6.1–8.1)

## 2018-01-04 LAB — LIPID PANEL
Cholesterol: 188 mg/dL (ref <200)
HDL: 34 mg/dL — ABNORMAL LOW (ref >50)
LDL Cholesterol (Calc): 101 mg/dL (calc) — ABNORMAL HIGH
Non-HDL Cholesterol (Calc): 154 mg/dL (calc) — ABNORMAL HIGH (ref <130)
Total CHOL/HDL Ratio: 5.5 (calc) — ABNORMAL HIGH (ref <5.0)
Triglycerides: 394 mg/dL — ABNORMAL HIGH (ref <150)

## 2018-01-04 LAB — CBC WITH DIFFERENTIAL/PLATELET
Absolute Monocytes: 734 cells/uL (ref 200–950)
Basophils Absolute: 61 cells/uL (ref 0–200)
Basophils Relative: 0.9 %
Eosinophils Absolute: 163 cells/uL (ref 15–500)
Eosinophils Relative: 2.4 %
HCT: 43.6 % (ref 35.0–45.0)
Hemoglobin: 14.7 g/dL (ref 11.7–15.5)
Lymphs Abs: 2067 cells/uL (ref 850–3900)
MCH: 31.7 pg (ref 27.0–33.0)
MCHC: 33.7 g/dL (ref 32.0–36.0)
MCV: 94 fL (ref 80.0–100.0)
MPV: 11.9 fL (ref 7.5–12.5)
Monocytes Relative: 10.8 %
Neutro Abs: 3774 {cells}/uL (ref 1500–7800)
Neutrophils Relative %: 55.5 %
Platelets: 282 Thousand/uL (ref 140–400)
RBC: 4.64 Million/uL (ref 3.80–5.10)
RDW: 12.7 % (ref 11.0–15.0)
Total Lymphocyte: 30.4 %
WBC: 6.8 Thousand/uL (ref 3.8–10.8)

## 2018-01-04 LAB — COMPLETE METABOLIC PANEL WITHOUT GFR
Chloride: 102 mmol/L (ref 98–110)
Globulin: 2.3 g/dL (ref 1.9–3.7)
Total Bilirubin: 0.3 mg/dL (ref 0.2–1.2)

## 2018-01-04 LAB — D-DIMER, QUANTITATIVE: D-Dimer, Quant: 0.78 mcg/mL FEU — ABNORMAL HIGH (ref <0.50)

## 2018-01-04 LAB — MAGNESIUM: Magnesium: 2.2 mg/dL (ref 1.5–2.5)

## 2018-01-06 NOTE — Progress Notes (Signed)
Spoke with patient directly regarding results.  She reports her blood pressure has improved and now in 130's over 80's.  She is still having some headaches but they are better.  Denies any further chest pains.  She asked about cutting back on metoprolol.  Discussed waiting until follow up in couple days.  Advise patient to continue monitoring blood pressure and contact office with any new symptoms of if pulse becomes lower.  She has been out of cholesterol medciation prior to lab draw, has since picked up Rx. Patient had no further questions regarding labs.

## 2018-01-07 ENCOUNTER — Encounter: Payer: Self-pay | Admitting: Adult Health Nurse Practitioner

## 2018-01-10 NOTE — Progress Notes (Signed)
Assessment and Plan: Improved since last visit Diagnoses and all orders for this visit:  Labile hypertension Controlled today 136/90.  Continue current regiment. Taking Norvasc 5mg  nightly Lasix 40mg  daily Metoprolol succinate 100mg  daily Olmesartan 40mg  daily Continue to monitor blood pressure and pulse Discussed goal for patient and when to contact office Patient is agreeable to this plan  Generalized headaches Improved since starting Norvasc nightly Continue to monitor  Medication management Discussed all medications and side effects. All questions answered  Call or return with new or worsening symptoms as discussed in appointment.  May contact via office phone [phone] or [place].  Follow up in three months for routine medication management. Further disposition pending results of labs. Discussed med's effects and SE's.   Over 30 minutes of exam, counseling, chart review, and critical decision making was performed.   Future Appointments  Date Time Provider [place]  01/11/2018  9:30 AM Garnet Sierras, NP [facility] None  02/07/2018 10:30 AM Garnet Sierras, NP [facility] None  05/19/2018 10:30 AM Unk Pinto, MD [facility] None  11/24/2018  9:00 AM Unk Pinto, MD [facility] None    ------------------------------------------------------------------------------------------------------------------   HPI 57 y.o.female presents for a one week follow up on labile HTN and headaches.  Last appointment Norvasc 5mg  was started at night.  She denies any side effects from this medication.  She has a log of her home blood pressures and they range from 140's/80's with highest 156/85.  Her blood pressure is controlled today at office visit.  She reports she is feeling better and her headaches have resolved.  She denies any chest pains, shortness of breath, palpitations, orthopnea, headaches or change in vision.  Past Medical History:  Diagnosis Date  .  Allergy   . Anxiety   . Asthma   . Cardiomyopathy ([place])    a. Echo 8/16:  EF 40-45%, apical and ant-septal HK, Gr 2 DD  . CHF (congestive heart failure) ([place])   . Diverticula, colon   . Family history of adverse reaction to anesthesia    sisiter had PONV  . Headache    rare migraines  . History of cardiovascular stress test    Myoview 8/16:  EF 48%, anterior and apical defect c/w breast atten, no ischemia; Low Risk // Nuclear stress test 9/18: Low risk stress nuclear study with fixed septal defect consistent with LBBB vs prior infarct; no ischemia; EF 53 with septal akinesis.  . Hyperlipidemia   . Hypertension   . Irregular periods/menstrual cycles   . LBBB (left bundle branch block)   . Melanoma ([place])    right arm  . Seasonal allergies   . Stroke [facility])    TIA august 2016  . TIA (transient ischemic attack)   . Torn meniscus      No Known Allergies  Current Outpatient Medications on File Prior to Visit  Medication Sig  . Adapalene-Benzoyl Peroxide (EPIDUO) 0.1-2.5 % gel Apply 1 application topically as needed (acne).  Marland Kitchen albuterol (PROVENTIL HFA;VENTOLIN HFA) 108 (90 Base) MCG/ACT inhaler Inhale 2 puffs into the lungs every 4 (four) hours as needed for wheezing or shortness of breath.  Marland Kitchen amLODipine (NORVASC) 10 MG tablet Take 1/2 to 1 tablet daily for BP  . aspirin EC 81 MG tablet Take 1 tablet (81 mg total) by mouth daily.  Marland Kitchen atorvastatin (LIPITOR) 40 MG tablet TAKE 1/2 TO 1 TABLET DAILY OR AS DIRECTED FOR CHOLESTEROL (Patient taking differently: Take 40 mg by mouth daily. )  . busPIRone (BUSPAR) 15 MG  tablet Take 1/2 to 1 tablet 2 x/day for Anxiety (Patient taking differently: Take 15 mg by mouth daily. Takes 1 tablet by mouth daily)  . Cholecalciferol (VITAMIN D PO) Take 1,000 Units by mouth 2 (two) times daily.   Marland Kitchen escitalopram (LEXAPRO) 10 MG tablet TAKE 1 TABLET BY MOUTH EVERY DAY  . estradiol (ESTRACE) 0.5 MG tablet Take 0.5 mg by mouth daily.  . fluticasone (FLONASE) 50  MCG/ACT nasal spray USE 2 SPRAYS IN EACH NOSTRIL DAILY  . furosemide (LASIX) 40 MG tablet Take 1 tablet daily for BP & Fluid (Patient taking differently: Take 40 mg by mouth daily. Take 1 tablet daily for BP & Fluid)  . gabapentin (NEURONTIN) 100 MG capsule TAKE 1 TO 3 CAPSULES BY MOUTH 2-3 TIMES DAILY AS DIRECTED (Patient taking differently: TAKE 2 CAPSULES BY MOUTH AT BEDTIME)  . medroxyPROGESTERone (PROVERA) 2.5 MG tablet Take 2.5 mg by mouth daily.  . metoprolol succinate (TOPROL-XL) 100 MG 24 hr tablet TAKE 1/2 TO 1 TABLET DAILY AS DIRECTED FOR BP (Patient taking differently: Take 100 mg by mouth daily. )  . montelukast (SINGULAIR) 10 MG tablet TAKE 1 TABLET BY MOUTH EVERY DAY  . olmesartan (BENICAR) 40 MG tablet TAKE 1/2 TAB DAILY IF BP 140/90, TAKE FULL TAB (Patient taking differently: Take 40 mg by mouth daily. )  . Omega-3 Fatty Acids (FISH OIL PO) Take 1 capsule by mouth daily.  . valACYclovir (VALTREX) 500 MG tablet Take 500 mg by mouth 2 (two) times daily as needed (fever blister outbreak).   No current facility-administered medications on file prior to visit.     ROS: all negative except above.   Physical Exam:  There were no vitals taken for this visit.  General Appearance: Well nourished, in no apparent distress. Eyes: PERRLA, EOMs, conjunctiva no swelling or erythema Respiratory: Respiratory effort normal, BS equal bilaterally without rales, rhonchi, wheezing or stridor.  Cardio: RRR with no MRGs. Brisk peripheral pulses without edema.  Skin: Warm, dry without rashes, lesions, ecchymosis.  Psych: Awake and oriented X 3, normal affect, Insight and Judgment appropriate.     Garnet Sierras, NP 1:06 PM [facility]

## 2018-01-11 ENCOUNTER — Encounter: Payer: Self-pay | Admitting: Adult Health Nurse Practitioner

## 2018-01-11 ENCOUNTER — Ambulatory Visit: Payer: BLUE CROSS/BLUE SHIELD | Admitting: Adult Health Nurse Practitioner

## 2018-01-11 VITALS — BP 136/90 | HR 74 | Temp 97.3°F | Ht 61.0 in | Wt 148.6 lb

## 2018-01-11 DIAGNOSIS — R0989 Other specified symptoms and signs involving the circulatory and respiratory systems: Secondary | ICD-10-CM

## 2018-01-11 DIAGNOSIS — Z79899 Other long term (current) drug therapy: Secondary | ICD-10-CM

## 2018-01-11 DIAGNOSIS — R519 Headache, unspecified: Secondary | ICD-10-CM

## 2018-01-11 DIAGNOSIS — R51 Headache: Secondary | ICD-10-CM

## 2018-01-11 MED ORDER — AMLODIPINE BESYLATE 5 MG PO TABS: 5.0000 mg | ORAL_TABLET | Freq: Every day | ORAL | 4 refills | Status: DC

## 2018-01-13 ENCOUNTER — Encounter: Payer: Self-pay | Admitting: Adult Health Nurse Practitioner

## 2018-01-19 ENCOUNTER — Other Ambulatory Visit: Payer: Self-pay | Admitting: Adult Health

## 2018-01-19 DIAGNOSIS — I10 Essential (primary) hypertension: Secondary | ICD-10-CM

## 2018-02-03 ENCOUNTER — Other Ambulatory Visit: Payer: Self-pay | Admitting: Internal Medicine

## 2018-02-04 NOTE — Progress Notes (Deleted)
Assessment and Plan:   Essential hypertension - continue medications, DASH diet, exercise and monitor at home. Call if greater than 130/80.  -     CBC with Differential/Platelet -     BASIC METABOLIC PANEL WITH GFR -     Hepatic function panel -     TSH  Systolic and diastolic CHF, acute ([place]) Great job with weight loss,  goal BP close to 110/60 Follow up cardiology  Chronic systolic CHF (congestive heart failure) ([place]) Great job with weight loss,no symptoms Follow up cardiology  Hyperlipidemia -continue medications, check lipids, decrease fatty foods, increase activity.  -     Lipid panel  Medication management -     Magnesium  Prediabetes -     Hemoglobin A1c   Continue diet and meds as discussed. Further disposition pending results of labs. Future Appointments  Date Time Provider [place]  02/07/2018 10:45 AM Vicie Mutters, PA-C [facility] None  05/19/2018 10:30 AM Unk Pinto, MD [facility] None  11/24/2018  9:00 AM Unk Pinto, MD [facility] None    HPI 58 y.o. female  presents for 3 month follow up with hypertension, hyperlipidemia, prediabetes and vitamin D.  Her blood pressure has been controlled at home, today their BP is   She does workout, she walks over 10000 steps daily. She denies chest pain, shortness of breath, dizziness.  She has a history of asthma Still having bilateral UE numbness primarily at night, did not get cervical neck xray.  She has a history of CHF, last echo 2016, stress test 09/2016, EF 40-45% with grade 2 diastolic dysfunction.  BMI is There is no height or weight on file to calculate BMI., she is working on diet and exercise. Wt Readings from Last 3 Encounters:  01/11/18 148 lb 9.6 oz (67.4 kg)  01/03/18 148 lb 9.6 oz (67.4 kg)  12/23/17 146 lb 9.6 oz (66.5 kg)    She is not on cholesterol medication and denies myalgias. Her cholesterol is not at goal. The cholesterol last visit was:   Lab Results  Component Value  Date   CHOL 188 01/03/2018   HDL 34 (L) 01/03/2018   LDLCALC 101 (H) 01/03/2018   TRIG 394 (H) 01/03/2018   CHOLHDL 5.5 (H) 01/03/2018   She has been working on diet and exercise for prediabetes, and denies paresthesia of the feet, polydipsia and polyuria. Last A1C in the office was:  Lab Results  Component Value Date   HGBA1C 5.6 11/05/2017   Patient is on Vitamin D supplement.   Lab Results  Component Value Date   VD25OH 41 11/05/2017     Current Medications:   Current Outpatient Medications (Endocrine & Metabolic):  .  estradiol (ESTRACE) 0.5 MG tablet, Take 0.5 mg by mouth daily. .  medroxyPROGESTERone (PROVERA) 2.5 MG tablet, Take 2.5 mg by mouth daily.  Current Outpatient Medications (Cardiovascular):  .  amLODipine (NORVASC) 5 MG tablet, Take 1 tablet (5 mg total) by mouth daily. Marland Kitchen  atorvastatin (LIPITOR) 40 MG tablet, TAKE 1/2 TO 1 TABLET DAILY OR AS DIRECTED FOR CHOLESTEROL (Patient taking differently: Take 40 mg by mouth daily. ) .  furosemide (LASIX) 40 MG tablet, TAKE 1 TABLET DAILY FOR BP & FLUID .  metoprolol succinate (TOPROL-XL) 100 MG 24 hr tablet, TAKE 1/2 TO 1 TABLET DAILY AS DIRECTED FOR BP (Patient taking differently: Take 100 mg by mouth daily. ) .  olmesartan (BENICAR) 40 MG tablet, TAKE 1/2 TAB DAILY IF BP 140/90, TAKE FULL TAB  Current Outpatient Medications (Respiratory):  .  albuterol (PROVENTIL HFA;VENTOLIN HFA) 108 (90 Base) MCG/ACT inhaler, Inhale 2 puffs into the lungs every 4 (four) hours as needed for wheezing or shortness of breath. .  fluticasone (FLONASE) 50 MCG/ACT nasal spray, USE 2 SPRAYS IN EACH NOSTRIL DAILY .  montelukast (SINGULAIR) 10 MG tablet, TAKE 1 TABLET BY MOUTH EVERY DAY  Current Outpatient Medications (Analgesics):  .  aspirin EC 81 MG tablet, Take 1 tablet (81 mg total) by mouth daily.   Current Outpatient Medications (Other):  Marland Kitchen  Adapalene-Benzoyl Peroxide (EPIDUO) 0.1-2.5 % gel, Apply 1 application topically as needed  (acne). .  busPIRone (BUSPAR) 15 MG tablet, Take 1/2 to 1 tablet 2 x/day for Anxiety (Patient taking differently: Take 15 mg by mouth daily. Takes 1 tablet by mouth daily) .  Cholecalciferol (VITAMIN D PO), Take 1,000 Units by mouth 2 (two) times daily.  Marland Kitchen  escitalopram (LEXAPRO) 10 MG tablet, TAKE 1 TABLET BY MOUTH EVERY DAY .  gabapentin (NEURONTIN) 100 MG capsule, TAKE 1 TO 3 CAPSULES BY MOUTH 2-3 TIMES DAILY AS DIRECTED (Patient taking differently: TAKE 2 CAPSULES BY MOUTH AT BEDTIME) .  Omega-3 Fatty Acids (FISH OIL PO), Take 1 capsule by mouth daily. .  valACYclovir (VALTREX) 500 MG tablet, Take 500 mg by mouth 2 (two) times daily as needed (fever blister outbreak).  Medical History:  Past Medical History:  Diagnosis Date  . Allergy   . Anxiety   . Asthma   . Cardiomyopathy ([place])    a. Echo 8/16:  EF 40-45%, apical and ant-septal HK, Gr 2 DD  . CHF (congestive heart failure) ([place])   . Diverticula, colon   . Family history of adverse reaction to anesthesia    sisiter had PONV  . Headache    rare migraines  . History of cardiovascular stress test    Myoview 8/16:  EF 48%, anterior and apical defect c/w breast atten, no ischemia; Low Risk // Nuclear stress test 9/18: Low risk stress nuclear study with fixed septal defect consistent with LBBB vs prior infarct; no ischemia; EF 53 with septal akinesis.  . Hyperlipidemia   . Hypertension   . Irregular periods/menstrual cycles   . LBBB (left bundle branch block)   . Melanoma ([place])    right arm  . Seasonal allergies   . Stroke [facility])    TIA august 2016  . TIA (transient ischemic attack)   . Torn meniscus    Allergies:  No Known Allergies   Review of Systems  Constitutional: Negative.   HENT: Negative.   Eyes: Negative.   Respiratory: Negative.   Cardiovascular: Negative.   Gastrointestinal: Negative.   Genitourinary: Negative.   Musculoskeletal: Negative.   Skin: Negative.   Neurological: Negative.   Endo/Heme/Allergies:  Negative.   Psychiatric/Behavioral: Negative.      Family history- Review and unchanged Social history- Review and unchanged Physical Exam: There were no vitals taken for this visit. Wt Readings from Last 3 Encounters:  01/11/18 148 lb 9.6 oz (67.4 kg)  01/03/18 148 lb 9.6 oz (67.4 kg)  12/23/17 146 lb 9.6 oz (66.5 kg)   General Appearance: Well nourished, in no apparent distress. Eyes: PERRLA, EOMs, conjunctiva no swelling or erythema Sinuses: No Frontal/maxillary tenderness ENT/Mouth: Ext aud canals clear, TMs without erythema, bulging. No erythema, swelling, or exudate on post pharynx.  Tonsils not swollen or erythematous. Hearing normal.  Neck: Supple, thyroid normal.  Respiratory: Respiratory effort normal, BS equal bilaterally without rales, rhonchi,  wheezing or stridor.  Cardio: RRR with no MRGs. Brisk peripheral pulses without edema.  Abdomen: Soft, + BS.  Non tender, no guarding, rebound, hernias, masses. Lymphatics: Non tender without lymphadenopathy.  Musculoskeletal: Full ROM, 5/5 strength, normal gait.  Skin: Warm, dry without rashes, lesions, ecchymosis.  Neuro: Cranial nerves intact. Normal muscle tone, no cerebellar symptoms. Sensation intact.  Psych: Awake and oriented X 3, normal affect, Insight and Judgment appropriate.    Vicie Mutters 3:42 PM

## 2018-02-07 ENCOUNTER — Ambulatory Visit: Payer: Self-pay | Admitting: Physician Assistant

## 2018-02-07 ENCOUNTER — Ambulatory Visit: Payer: Self-pay | Admitting: Adult Health Nurse Practitioner

## 2018-02-23 DIAGNOSIS — M1712 Unilateral primary osteoarthritis, left knee: Secondary | ICD-10-CM | POA: Diagnosis not present

## 2018-03-09 NOTE — Progress Notes (Signed)
Assessment and Plan:   Essential hypertension - continue medications, DASH diet, exercise and monitor at home. Call if greater than 130/80.  -     CBC with Differential/Platelet -     BASIC METABOLIC PANEL WITH GFR -     Hepatic function panel -     TSH  Systolic and diastolic CHF, acute ([facility])   goal BP close to 110/60 Follow up cardiology  Chronic systolic CHF (congestive heart failure) ([place]) Great job with weight loss Some swelling, will get echo prior to surgery Follow up cardiology  Hyperlipidemia -continue medications, check lipids, decrease fatty foods, increase activity.  -     Lipid panel  Medication management -     Magnesium  Prediabetes -     Hemoglobin A1c   Continue diet and meds as discussed. Further disposition pending results of labs. Future Appointments  Date Time Provider [place]  03/10/2018 10:30 AM Vicie Mutters, PA-C [facility] None  04/04/2018 11:30 AM Dohmeier, Asencion Partridge, MD [facility] None  05/19/2018 10:30 AM Unk Pinto, MD [facility] None  11/24/2018  9:00 AM Unk Pinto, MD [facility] None    HPI 58 y.o. female  presents for 3 month follow up with hypertension, hyperlipidemia, prediabetes and vitamin D.   She is getting knee replacement on left knee in Sept. She states she can not walk as much due to it and wants it done. She will be getting a sleep study in March.   Her blood pressure has been controlled at home, today their BP is BP: 124/68 She does workout. She denies chest pain, shortness of breath, dizziness.  She has a history of asthma She is on lexapro for anxiety/hot flashes.  She has a history of CHF, last echo 2016, stress test 09/2016, EF 40-45% with grade 2 diastolic dysfunction.  She is noticing swelling bilateral legs, worse left leg due to her knee.   BMI is Body mass index is 28.72 kg/m., she is working on diet and exercise. Wt Readings from Last 3 Encounters:  03/10/18 152 lb (68.9 kg)  01/11/18 148 lb 9.6  oz (67.4 kg)  01/03/18 148 lb 9.6 oz (67.4 kg)    She is on cholesterol medication, lipitor 3 x a week and denies myalgias. Her cholesterol is not at goal. The cholesterol last visit was:   Lab Results  Component Value Date   CHOL 188 01/03/2018   HDL 34 (L) 01/03/2018   LDLCALC 101 (H) 01/03/2018   TRIG 394 (H) 01/03/2018   CHOLHDL 5.5 (H) 01/03/2018   She has been working on diet and exercise for prediabetes, and denies paresthesia of the feet, polydipsia and polyuria. Last A1C in the office was:  Lab Results  Component Value Date   HGBA1C 5.6 11/05/2017   Patient is on Vitamin D supplement.   Lab Results  Component Value Date   VD25OH 41 11/05/2017     Current Medications:   Current Outpatient Medications (Endocrine & Metabolic):  .  estradiol (ESTRACE) 0.5 MG tablet, Take 0.5 mg by mouth daily. .  medroxyPROGESTERone (PROVERA) 2.5 MG tablet, Take 2.5 mg by mouth daily.  Current Outpatient Medications (Cardiovascular):  .  amLODipine (NORVASC) 5 MG tablet, Take 1 tablet (5 mg total) by mouth daily. Marland Kitchen  atorvastatin (LIPITOR) 40 MG tablet, TAKE 1/2 TO 1 TABLET DAILY OR AS DIRECTED FOR CHOLESTEROL (Patient taking differently: Take 40 mg by mouth daily. ) .  furosemide (LASIX) 40 MG tablet, TAKE 1 TABLET DAILY FOR BP & FLUID .  metoprolol succinate (TOPROL-XL) 100 MG 24 hr tablet, TAKE 1/2 TO 1 TABLET DAILY AS DIRECTED FOR BP (Patient taking differently: Take 100 mg by mouth daily. ) .  olmesartan (BENICAR) 40 MG tablet, TAKE 1/2 TAB DAILY IF BP 140/90, TAKE FULL TAB  Current Outpatient Medications (Respiratory):  .  albuterol (PROVENTIL HFA;VENTOLIN HFA) 108 (90 Base) MCG/ACT inhaler, Inhale 2 puffs into the lungs every 4 (four) hours as needed for wheezing or shortness of breath. .  fluticasone (FLONASE) 50 MCG/ACT nasal spray, USE 2 SPRAYS IN EACH NOSTRIL DAILY .  montelukast (SINGULAIR) 10 MG tablet, TAKE 1 TABLET BY MOUTH EVERY DAY  Current Outpatient Medications  (Analgesics):  .  aspirin EC 81 MG tablet, Take 1 tablet (81 mg total) by mouth daily.   Current Outpatient Medications (Other):  Marland Kitchen  Adapalene-Benzoyl Peroxide (EPIDUO) 0.1-2.5 % gel, Apply 1 application topically as needed (acne). .  Cholecalciferol (VITAMIN D PO), Take 1,000 Units by mouth 2 (two) times daily.  Marland Kitchen  escitalopram (LEXAPRO) 10 MG tablet, TAKE 1 TABLET BY MOUTH EVERY DAY .  Omega-3 Fatty Acids (FISH OIL PO), Take 1 capsule by mouth daily. .  valACYclovir (VALTREX) 500 MG tablet, Take 500 mg by mouth 2 (two) times daily as needed (fever blister outbreak).  Medical History:  Past Medical History:  Diagnosis Date  . Allergy   . Anxiety   . Asthma   . Cardiomyopathy ([place])    a. Echo 8/16:  EF 40-45%, apical and ant-septal HK, Gr 2 DD  . CHF (congestive heart failure) ([place])   . Diverticula, colon   . Family history of adverse reaction to anesthesia    sisiter had PONV  . Headache    rare migraines  . History of cardiovascular stress test    Myoview 8/16:  EF 48%, anterior and apical defect c/w breast atten, no ischemia; Low Risk // Nuclear stress test 9/18: Low risk stress nuclear study with fixed septal defect consistent with LBBB vs prior infarct; no ischemia; EF 53 with septal akinesis.  . Hyperlipidemia   . Hypertension   . Irregular periods/menstrual cycles   . LBBB (left bundle branch block)   . Melanoma ([place])    right arm  . Seasonal allergies   . Stroke [facility])    TIA august 2016  . TIA (transient ischemic attack)   . Torn meniscus    Allergies:  No Known Allergies   Review of Systems  Constitutional: Negative.   HENT: Negative.   Eyes: Negative.   Respiratory: Negative.   Cardiovascular: Negative.   Gastrointestinal: Negative.   Genitourinary: Negative.   Musculoskeletal: Negative.   Skin: Negative.   Neurological: Negative.   Endo/Heme/Allergies: Negative.   Psychiatric/Behavioral: Negative.      Family history- Review and unchanged Social  history- Review and unchanged Physical Exam: BP 124/68   Pulse 64   Temp (!) 97.3 F (36.3 C)   Wt 152 lb (68.9 kg)   SpO2 97%   BMI 28.72 kg/m  Wt Readings from Last 3 Encounters:  03/10/18 152 lb (68.9 kg)  01/11/18 148 lb 9.6 oz (67.4 kg)  01/03/18 148 lb 9.6 oz (67.4 kg)   General Appearance: Well nourished, in no apparent distress. Eyes: PERRLA, EOMs, conjunctiva no swelling or erythema Sinuses: No Frontal/maxillary tenderness ENT/Mouth: Ext aud canals clear, TMs without erythema, bulging. No erythema, swelling, or exudate on post pharynx.  Tonsils not swollen or erythematous. Hearing normal.  Neck: Supple, thyroid normal.  Respiratory: Respiratory effort  normal, BS equal bilaterally without rales, rhonchi, wheezing or stridor.  Cardio: RRR with no MRGs. Brisk peripheral pulses without edema.  Abdomen: Soft, + BS.  Non tender, no guarding, rebound, hernias, masses. Lymphatics: Non tender without lymphadenopathy.  Musculoskeletal: Full ROM, 5/5 strength, normal gait.  Skin: Warm, dry without rashes, lesions, ecchymosis.  Neuro: Cranial nerves intact. Normal muscle tone, no cerebellar symptoms. Sensation intact.  Psych: Awake and oriented X 3, normal affect, Insight and Judgment appropriate.    Vicie Mutters 10:28 AM

## 2018-03-10 ENCOUNTER — Ambulatory Visit (INDEPENDENT_AMBULATORY_CARE_PROVIDER_SITE_OTHER): Payer: BLUE CROSS/BLUE SHIELD | Admitting: Physician Assistant

## 2018-03-10 ENCOUNTER — Encounter: Payer: Self-pay | Admitting: Physician Assistant

## 2018-03-10 VITALS — BP 124/68 | HR 64 | Temp 97.3°F | Wt 152.0 lb

## 2018-03-10 DIAGNOSIS — I5022 Chronic systolic (congestive) heart failure: Secondary | ICD-10-CM | POA: Diagnosis not present

## 2018-03-10 DIAGNOSIS — E782 Mixed hyperlipidemia: Secondary | ICD-10-CM | POA: Diagnosis not present

## 2018-03-10 DIAGNOSIS — R7309 Other abnormal glucose: Secondary | ICD-10-CM

## 2018-03-10 DIAGNOSIS — Z79899 Other long term (current) drug therapy: Secondary | ICD-10-CM | POA: Diagnosis not present

## 2018-03-10 DIAGNOSIS — I10 Essential (primary) hypertension: Secondary | ICD-10-CM

## 2018-03-10 DIAGNOSIS — E559 Vitamin D deficiency, unspecified: Secondary | ICD-10-CM

## 2018-03-10 NOTE — Patient Instructions (Signed)
  Google mindful eating and here are some tips and tricks below.   Rate your hunger before you eat on a scale of 1-10, try to eat closer to a 6 or higher. And if you are at below that, why are you eating? Slow down and listen to your body.

## 2018-03-11 LAB — LIPID PANEL
Cholesterol: 167 mg/dL (ref <200)
HDL: 33 mg/dL — ABNORMAL LOW (ref >=50)
Non-HDL Cholesterol (Calc): 134 mg/dL (calc) — ABNORMAL HIGH (ref <130)
Total CHOL/HDL Ratio: 5.1 (calc) — ABNORMAL HIGH (ref <5.0)
Triglycerides: 439 mg/dL — ABNORMAL HIGH (ref <150)

## 2018-03-11 LAB — COMPLETE METABOLIC PANEL WITH GFR
AG Ratio: 1.7 (calc) (ref 1.0–2.5)
ALT: 25 U/L (ref 6–29)
AST: 21 U/L (ref 10–35)
Albumin: 4.2 g/dL (ref 3.6–5.1)
Alkaline phosphatase (APISO): 66 U/L (ref 37–153)
BILIRUBIN TOTAL: 0.6 mg/dL (ref 0.2–1.2)
BUN: 15 mg/dL (ref 7–25)
CO2: 26 mmol/L (ref 20–32)
Calcium: 9.3 mg/dL (ref 8.6–10.4)
Chloride: 101 mmol/L (ref 98–110)
Creat: 0.85 mg/dL (ref 0.50–1.05)
GFR, EST AFRICAN AMERICAN: 88 mL/min/{1.73_m2} (ref >=60)
GFR, Est Non African American: 76 mL/min/{1.73_m2} (ref >=60)
Globulin: 2.5 g/dL (calc) (ref 1.9–3.7)
Glucose, Bld: 102 mg/dL — ABNORMAL HIGH (ref 65–99)
Potassium: 4.5 mmol/L (ref 3.5–5.3)
Sodium: 138 mmol/L (ref 135–146)
TOTAL PROTEIN: 6.7 g/dL (ref 6.1–8.1)

## 2018-03-11 LAB — CBC WITH DIFFERENTIAL/PLATELET
Absolute Monocytes: 473 cells/uL (ref 200–950)
BASOS ABS: 60 {cells}/uL (ref 0–200)
Basophils Relative: 0.8 %
Eosinophils Absolute: 150 cells/uL (ref 15–500)
Eosinophils Relative: 2 %
HCT: 43.5 % (ref 35.0–45.0)
Hemoglobin: 14.4 g/dL (ref 11.7–15.5)
Lymphs Abs: 1808 cells/uL (ref 850–3900)
MCH: 30.7 pg (ref 27.0–33.0)
MCHC: 33.1 g/dL (ref 32.0–36.0)
MCV: 92.8 fL (ref 80.0–100.0)
MONOS PCT: 6.3 %
MPV: 12.6 fL — ABNORMAL HIGH (ref 7.5–12.5)
Neutro Abs: 5010 cells/uL (ref 1500–7800)
Neutrophils Relative %: 66.8 %
Platelets: 327 10*3/uL (ref 140–400)
RBC: 4.69 10*6/uL (ref 3.80–5.10)
RDW: 13.2 % (ref 11.0–15.0)
Total Lymphocyte: 24.1 %
WBC: 7.5 10*3/uL (ref 3.8–10.8)

## 2018-03-11 LAB — HEMOGLOBIN A1C
EAG (MMOL/L): 6.5 (calc)
HEMOGLOBIN A1C: 5.7 %{Hb} — AB (ref <5.7)
MEAN PLASMA GLUCOSE: 117 (calc)

## 2018-03-11 LAB — TSH: TSH: 1.34 mIU/L (ref 0.40–4.50)

## 2018-03-11 LAB — MAGNESIUM: Magnesium: 2.2 mg/dL (ref 1.5–2.5)

## 2018-03-14 DIAGNOSIS — Z23 Encounter for immunization: Secondary | ICD-10-CM | POA: Diagnosis not present

## 2018-03-14 DIAGNOSIS — D2272 Melanocytic nevi of left lower limb, including hip: Secondary | ICD-10-CM | POA: Diagnosis not present

## 2018-03-14 DIAGNOSIS — L700 Acne vulgaris: Secondary | ICD-10-CM | POA: Diagnosis not present

## 2018-03-14 DIAGNOSIS — Z85820 Personal history of malignant melanoma of skin: Secondary | ICD-10-CM | POA: Diagnosis not present

## 2018-03-18 ENCOUNTER — Other Ambulatory Visit: Payer: Self-pay | Admitting: Internal Medicine

## 2018-03-30 ENCOUNTER — Other Ambulatory Visit: Payer: Self-pay | Admitting: Internal Medicine

## 2018-04-01 ENCOUNTER — Encounter: Payer: Self-pay | Admitting: Neurology

## 2018-04-01 ENCOUNTER — Telehealth: Payer: Self-pay | Admitting: Neurology

## 2018-04-01 NOTE — Telephone Encounter (Signed)
Called to cancel patient there was no answer. LVM informing the patient  That our office is cancelling patients on Monday. Instructed on VM that I will call back on Monday to get her rescheduled. Also sent the patient a mychart message

## 2018-04-04 ENCOUNTER — Telehealth: Payer: Self-pay | Admitting: Neurology

## 2018-04-04 ENCOUNTER — Institutional Professional Consult (permissible substitution): Payer: BLUE CROSS/BLUE SHIELD | Admitting: Neurology

## 2018-04-04 NOTE — Telephone Encounter (Signed)
Called the patient to discuss what steps are office is taking in still getting to our sleep consult patient's there was no answer. LVM informing the patient to call back so tha we

## 2018-04-04 NOTE — Telephone Encounter (Signed)
LVM for the patient to call back to discuss the phone office visit as an options or get the patient scheduled later for sleep consult when things are more normal.

## 2018-04-07 ENCOUNTER — Telehealth: Payer: Self-pay | Admitting: Cardiology

## 2018-04-07 NOTE — Telephone Encounter (Signed)
I called pt regarding her upcoming appointment with me scheduled for 04/11/2018. She tells me that she is not having any current issues. She is planned for knee surgery in September and her surgeon wanted her to be checked out before then. We discussed the current COVID 19 pandemic restrictions and she agrees that she is fine with being seen closer to her surgery. We will put her in for a recall for 3 months so that she can get scheduled for an appt in August. I advised her that if she is not called by the end of July she should call to schedule the appt.  She agrees and thanks [place] for being careful in this trying time.   Daune Perch, AGNP-C [facility] 04/07/2018  9:33 AM

## 2018-04-11 ENCOUNTER — Ambulatory Visit: Payer: BLUE CROSS/BLUE SHIELD | Admitting: Cardiology

## 2018-04-18 ENCOUNTER — Telehealth: Payer: Self-pay | Admitting: Neurology

## 2018-04-18 ENCOUNTER — Encounter: Payer: Self-pay | Admitting: Neurology

## 2018-04-18 NOTE — Telephone Encounter (Signed)
Due to current COVID 19 pandemic, our office is severely reducing in office visits for at least the next 2 weeks, in order to minimize the risk to our patients and healthcare providers. Our staff will contact you for next steps.  Pt understands that although there may be some limitations with this type of visit, we will take all precautions to reduce any security or privacy concerns.  Pt understands that this will be treated like an in office visit and we will file with pt's insurance, and there may be a patient responsible charge related to this service. Pt's email is [email or web address]. Pt understands that the [employer] webex software must be downloaded and operational on the device pt plans to use for the visit. Pt understands that the nurse will be calling to go over pt's chart.

## 2018-04-19 ENCOUNTER — Encounter: Payer: Self-pay | Admitting: Neurology

## 2018-04-19 NOTE — Telephone Encounter (Signed)
Was able to review the chart with the patient and marked as updated. Patient states that she did receive the e-mail and will make sure app is downloaded.  Advised the patient to contact us if she has any other issues as well replying to the mychart message if she has time to answer the questions for her visit. Pt verbalized understanding and stated she would do that today.

## 2018-04-26 ENCOUNTER — Other Ambulatory Visit: Payer: Self-pay

## 2018-04-26 ENCOUNTER — Ambulatory Visit (INDEPENDENT_AMBULATORY_CARE_PROVIDER_SITE_OTHER): Payer: BLUE CROSS/BLUE SHIELD | Admitting: Neurology

## 2018-04-26 DIAGNOSIS — R0683 Snoring: Secondary | ICD-10-CM

## 2018-04-26 DIAGNOSIS — G4763 Sleep related bruxism: Secondary | ICD-10-CM | POA: Diagnosis not present

## 2018-04-26 DIAGNOSIS — G478 Other sleep disorders: Secondary | ICD-10-CM

## 2018-04-26 DIAGNOSIS — R5382 Chronic fatigue, unspecified: Secondary | ICD-10-CM

## 2018-04-26 DIAGNOSIS — Z8673 Personal history of transient ischemic attack (TIA), and cerebral infarction without residual deficits: Secondary | ICD-10-CM | POA: Diagnosis not present

## 2018-04-26 DIAGNOSIS — I5022 Chronic systolic (congestive) heart failure: Secondary | ICD-10-CM

## 2018-04-26 DIAGNOSIS — I10 Essential (primary) hypertension: Secondary | ICD-10-CM | POA: Diagnosis not present

## 2018-04-26 DIAGNOSIS — C4362 Malignant melanoma of left upper limb, including shoulder: Secondary | ICD-10-CM
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
## 2018-04-28 DIAGNOSIS — R238 Other skin changes: Secondary | ICD-10-CM | POA: Diagnosis not present

## 2018-04-28 DIAGNOSIS — L821 Other seborrheic keratosis: Secondary | ICD-10-CM | POA: Diagnosis not present

## 2018-05-01 ENCOUNTER — Encounter: Payer: Self-pay | Admitting: Neurology

## 2018-05-01 DIAGNOSIS — R5382 Chronic fatigue, unspecified: Secondary | ICD-10-CM | POA: Insufficient documentation

## 2018-05-01 DIAGNOSIS — G478 Other sleep disorders: Secondary | ICD-10-CM | POA: Insufficient documentation

## 2018-05-01 DIAGNOSIS — G4763 Sleep related bruxism: Secondary | ICD-10-CM | POA: Insufficient documentation

## 2018-05-01 DIAGNOSIS — R0683 Snoring: Secondary | ICD-10-CM | POA: Insufficient documentation

## 2018-05-01 DIAGNOSIS — C4362 Malignant melanoma of left upper limb, including shoulder: Secondary | ICD-10-CM | POA: Insufficient documentation

## 2018-05-01 DIAGNOSIS — I5022 Chronic systolic (congestive) heart failure: Secondary | ICD-10-CM | POA: Insufficient documentation

## 2018-05-01 NOTE — Patient Instructions (Signed)
I have ordered a HST for this patient, she can follow up in 4-6 weeks if her HST did not indicate apnea, and in 3 month if apnea was identified and and treatment initiated.

## 2018-05-04 ENCOUNTER — Other Ambulatory Visit: Payer: Self-pay | Admitting: Internal Medicine

## 2018-05-04 DIAGNOSIS — F411 Generalized anxiety disorder: Secondary | ICD-10-CM

## 2018-05-18 DIAGNOSIS — H16011 Central corneal ulcer, right eye: Secondary | ICD-10-CM | POA: Diagnosis not present

## 2018-05-19 ENCOUNTER — Other Ambulatory Visit: Payer: Self-pay | Admitting: Internal Medicine

## 2018-05-19 ENCOUNTER — Ambulatory Visit: Payer: Self-pay | Admitting: Internal Medicine

## 2018-05-19 ENCOUNTER — Telehealth: Payer: Self-pay | Admitting: Cardiovascular Disease

## 2018-05-19 NOTE — Telephone Encounter (Signed)
Left message for patient to call me back regarding upcoming appointment: [phone].

## 2018-05-20 ENCOUNTER — Other Ambulatory Visit: Payer: Self-pay | Admitting: General Surgery

## 2018-05-20 DIAGNOSIS — C4361 Malignant melanoma of right upper limb, including shoulder: Secondary | ICD-10-CM | POA: Diagnosis not present

## 2018-05-23 NOTE — Telephone Encounter (Signed)
Spoke with patient who confirmed all demographics. Patient has a smart phone and uses My Chart. Will have vitals ready for visit. °

## 2018-05-25 ENCOUNTER — Other Ambulatory Visit: Payer: Self-pay

## 2018-05-25 ENCOUNTER — Ambulatory Visit (HOSPITAL_COMMUNITY)
Admission: RE | Admit: 2018-05-25 | Discharge: 2018-05-25 | Disposition: A | Discharge status: Home / Self Care | Payer: BLUE CROSS/BLUE SHIELD | Source: Ambulatory Visit | Attending: Physician Assistant | Admitting: Physician Assistant

## 2018-05-25 DIAGNOSIS — E785 Hyperlipidemia, unspecified: Secondary | ICD-10-CM | POA: Diagnosis not present

## 2018-05-25 DIAGNOSIS — Z01818 Encounter for other preprocedural examination: Secondary | ICD-10-CM | POA: Insufficient documentation

## 2018-05-25 DIAGNOSIS — I5022 Chronic systolic (congestive) heart failure: Secondary | ICD-10-CM | POA: Insufficient documentation

## 2018-05-25 NOTE — Progress Notes (Signed)
  Echocardiogram 2D Echocardiogram has been performed.  Gail Bailey 05/25/2018, 2:30 PM

## 2018-05-27 ENCOUNTER — Telehealth: Payer: BLUE CROSS/BLUE SHIELD | Admitting: Cardiovascular Disease

## 2018-05-30 ENCOUNTER — Other Ambulatory Visit: Payer: Self-pay

## 2018-05-30 ENCOUNTER — Encounter: Payer: Self-pay | Admitting: Cardiovascular Disease

## 2018-05-30 ENCOUNTER — Telehealth (INDEPENDENT_AMBULATORY_CARE_PROVIDER_SITE_OTHER): Payer: BLUE CROSS/BLUE SHIELD | Admitting: Cardiovascular Disease

## 2018-05-30 VITALS — BP 123/72 | HR 72 | Ht 61.0 in | Wt 143.0 lb

## 2018-05-30 DIAGNOSIS — I10 Essential (primary) hypertension: Secondary | ICD-10-CM

## 2018-05-30 DIAGNOSIS — I5022 Chronic systolic (congestive) heart failure: Secondary | ICD-10-CM

## 2018-05-30 DIAGNOSIS — Z7189 Other specified counseling: Secondary | ICD-10-CM

## 2018-05-30 NOTE — Patient Instructions (Signed)
Medication Instructions:  Your physician recommends that you continue on your current medications as directed. Please refer to the Current Medication list given to you today.  If you need a refill on your cardiac medications before your next appointment, please call your pharmacy.    Lab work: None Ordered   Testing/Procedures: None Ordered   Follow-Up: At [facility], you and your health needs are our priority.  As part of our continuing mission to provide you with exceptional heart care, we have created designated Provider Care Teams.  These Care Teams include your primary Cardiologist (physician) and Advanced Practice Providers (APPs -  Physician Assistants and Nurse Practitioners) who all work together to provide you with the care you need, when you need it. You will need a follow up appointment in:  6 months.  Please call our office 2 months in advance to schedule this appointment.  You may see Dr. Acie Fredrickson or one of the following Advanced Practice Providers on your designated Care Team: Richardson Dopp, PA-C [place], [place] . Daune Perch, NP

## 2018-05-30 NOTE — Progress Notes (Signed)
Virtual Visit via Video Note   This visit type was conducted due to national recommendations for restrictions regarding the COVID-19 Pandemic (e.g. social distancing) in an effort to limit this patient's exposure and mitigate transmission in our community.  Due to her co-morbid illnesses, this patient is at least at moderate risk for complications without adequate follow up.  This format is felt to be most appropriate for this patient at this time.  All issues noted in this document were discussed and addressed.  A limited physical exam was performed with this format.  Please refer to the patient's chart for her consent to telehealth for [facility].   Date:  05/30/2018   ID:  Gail Bailey, DOB July 20, 1960, MRN [id number]  Patient Location: Home Provider Location: Home  PCP:  Unk Pinto, MD  Cardiologist:  Nahser  Electrophysiologist:  None   Evaluation Performed:  Follow-Up Visit  Chief Complaint:  Chest pain ,  Dyspnea   Previous notes from [place], [place]   Gail Bailey is a 58 y.o. female with a hx of HTN, prior TIA. She was admitted in 8/16 for symptoms of TIA. Echo demonstrated EF 40-45 with apical and anteroseptal hypokinesis.  A follow up nuclear stress test demonstrated no ischemia.  Her EF was 48%.  She was ultimately set up for a cardiac MRI that demonstrated normal LVF with EF 59%.  Last seen by Dr. Liam Rogers in 10/17.    Gail Bailey returns for evaluation of chest discomfort. She was in [place] back in February/March. She was working and developed chest discomfort radiating to her arm with associated dyspnea. Her blood pressure was 198/95. She ended up going to the emergency room in [facility]. She had serial cardiac enzymes that were negative per her report. She had no other testing done. She's had a few other episodes of chest pain since that time. She's had heartburn for the first time in her life. She takes Tums with minimal relief. She continues to exercise by  walking 15,000 steps a day and doing yoga 3-4 times a week. She denies exertional chest symptoms. She notes dyspnea with more extreme activities. She sleeps on 2 pillows. She denies PND or significant edema  Gail Bailey has had some shortness of breath recently.  Echocardiogram revealed an ejection fraction of 50%.  There was some septal-lateral dyssynchrony consistent with a left bundle branch block.  May 30, 2018    Gail Bailey is a 58 y.o. female with LBBB , mild systolic CHF,   She has a persistently abnormal myoview Cath about 20 years ago was normal Is having some swelling in her left leg - likely due to her knee  Has rare cp - this is rare and atypical . Still walks regularly - 8000-10000 steps a day ( limited by her knee)  No CP with exertion.   Avoids salty foods .   The patient does not have symptoms concerning for COVID-19 infection (fever, chills, cough, or new shortness of breath).    Past Medical History:  Diagnosis Date  . Allergy   . Anxiety   . Asthma   . Cardiomyopathy ([place])    a. Echo 8/16:  EF 40-45%, apical and ant-septal HK, Gr 2 DD  . CHF (congestive heart failure) ([place])   . Diverticula, colon   . Family history of adverse reaction to anesthesia    sisiter had PONV  . Headache    rare migraines  . History of cardiovascular stress test  Myoview 8/16:  EF 48%, anterior and apical defect c/w breast atten, no ischemia; Low Risk // Nuclear stress test 9/18: Low risk stress nuclear study with fixed septal defect consistent with LBBB vs prior infarct; no ischemia; EF 53 with septal akinesis.  . Hyperlipidemia   . Hypertension   . Irregular periods/menstrual cycles   . LBBB (left bundle branch block)   . Melanoma ([place])    right arm  . Seasonal allergies   . Stroke [facility])    TIA august 2016  . TIA (transient ischemic attack)   . Torn meniscus    Past Surgical History:  Procedure Laterality Date  . ABLATION    . BREAST REDUCTION SURGERY    . CARDIAC  CATHETERIZATION    . CESAREAN SECTION     x2  . KNEE SURGERY     partial knee replacement right  . MELANOMA EXCISION WITH SENTINEL LYMPH NODE BIOPSY Right 09/15/2017   Procedure: BIOPSY RIGHT SHOULDER MOLE AND WIDE LOCAL EXCISION OF RIGHT ARM MELANOMA, ADVANCEMENT FLAP CLOSURE, SENTINEL LYMPH NODE BIOPSY  RIGHT ARM MELANOMA;  Surgeon: Stark Klein, MD;  Location: [facility];  Service: General;  Laterality: Right;  . REPAIR QUADRICEPS / HAMSTRING MUSCLE    . TONSILLECTOMY    . tubes in ears  10/12     Current Meds  Medication Sig  . Adapalene-Benzoyl Peroxide (EPIDUO) 0.1-2.5 % gel Apply 1 application topically as needed (acne).  Marland Kitchen albuterol (PROVENTIL HFA;VENTOLIN HFA) 108 (90 Base) MCG/ACT inhaler Inhale 2 puffs into the lungs every 4 (four) hours as needed for wheezing or shortness of breath.  Marland Kitchen amLODipine (NORVASC) 5 MG tablet Take 1 tablet (5 mg total) by mouth daily.  Marland Kitchen aspirin EC 81 MG tablet Take 1 tablet (81 mg total) by mouth daily.  Marland Kitchen atorvastatin (LIPITOR) 40 MG tablet Take 20 mg by mouth 3 (three) times a week.  . Cholecalciferol (VITAMIN D PO) Take 1,000 Units by mouth 2 (two) times daily.   Marland Kitchen escitalopram (LEXAPRO) 10 MG tablet TAKE 1 TABLET BY MOUTH EVERY DAY  . estradiol (ESTRACE) 0.5 MG tablet Take 0.5 mg by mouth daily.  . fluticasone (FLONASE) 50 MCG/ACT nasal spray USE 2 SPRAYS IN EACH NOSTRIL DAILY  . furosemide (LASIX) 40 MG tablet TAKE 1 TABLET DAILY FOR BP & FLUID  . medroxyPROGESTERone (PROVERA) 2.5 MG tablet Take 2.5 mg by mouth daily.  . metoprolol succinate (TOPROL-XL) 100 MG 24 hr tablet Take 50 mg by mouth daily. Take with or immediately following a meal.  . montelukast (SINGULAIR) 10 MG tablet TAKE 1 TABLET BY MOUTH EVERY DAY  . olmesartan (BENICAR) 40 MG tablet TAKE 1/2 TAB DAILY IF BP 140/90, TAKE FULL TAB  . Omega-3 Fatty Acids (FISH OIL PO) Take 1 capsule by mouth daily.  . valACYclovir (VALTREX) 500 MG tablet Take 500 mg by mouth 2 (two)  times daily as needed (fever blister outbreak).     Allergies:   Patient has no known allergies.   Social History   Tobacco Use  . Smoking status: Never Smoker  . Smokeless tobacco: Never Used  Substance Use Topics  . Alcohol use: Yes    Comment: 1-2 glasses wine nightly  . Drug use: No     Family Hx: The patient's family history includes Asthma in her sister and son; Cancer in her maternal grandmother; Diabetes in her mother; Heart attack in her paternal grandmother; Heart attack (age of onset: 72) in her mother; Heart disease  in her mother; Hypertension in her mother; Other in her father. There is no history of Colon cancer, Colon polyps, Esophageal cancer, Stomach cancer, or Rectal cancer.  ROS:   Please see the history of present illness.     All other systems reviewed and are negative.   Prior CV studies:   The following studies were reviewed today:    Labs/Other Tests and Data Reviewed:    EKG:  An ECG dated Dec, 2019  was personally reviewed today and demonstrated:   NSR , LBBB   Recent Labs: 03/10/2018: ALT 25; BUN 15; Creat 0.85; Hemoglobin 14.4; Magnesium 2.2; Platelets 327; Potassium 4.5; Sodium 138; TSH 1.34   Recent Lipid Panel Lab Results  Component Value Date/Time   CHOL 167 03/10/2018 10:41 AM   TRIG 439 (H) 03/10/2018 10:41 AM   HDL 33 (L) 03/10/2018 10:41 AM   CHOLHDL 5.1 (H) 03/10/2018 10:41 AM   LDLCALC  03/10/2018 10:41 AM     Comment:     . LDL cholesterol not calculated. Triglyceride levels greater than 400 mg/dL invalidate calculated LDL results. . Reference range: <100 . Desirable range <100 mg/dL for primary prevention;   <70 mg/dL for patients with CHD or diabetic patients  with > or = 2 CHD risk factors. Marland Kitchen LDL-C is now calculated using the Martin-Hopkins  calculation, which is a validated novel method providing  better accuracy than the Friedewald equation in the  estimation of LDL-C.  Cresenciano Genre et al. Annamaria Helling. 1443;154(00):  2061-2068  (http://education.[employer].com/faq/FAQ164)     Wt Readings from Last 3 Encounters:  05/30/18 143 lb (64.9 kg)  03/10/18 152 lb (68.9 kg)  01/11/18 148 lb 9.6 oz (67.4 kg)     Objective:    Vital Signs:  BP 123/72   Pulse 72   Ht 5\' 1"  (1.549 m)   Wt 143 lb (64.9 kg)   BMI 27.02 kg/m    VITAL SIGNS:  reviewed GEN:  no acute distress EYES:  sclerae anicteric, EOMI - Extraocular Movements Intact RESPIRATORY:  normal respiratory effort, symmetric expansion CARDIOVASCULAR:  no peripheral edema SKIN:  no rash, lesions or ulcers. MUSCULOSKELETAL:  no obvious deformities. NEURO:  alert and oriented x 3, no obvious focal deficit PSYCH:  normal affect  ASSESSMENT & PLAN:    1. Mild systolic congestive heart failure: Gail Bailey Seems to be doing very well.  She has had a left bundle branch block in mild systolic dysfunction for years.  She is basically asymptomatic.  We have performed a heart catheterization on her years ago which was normal and she does not have any episodes of angina.  She needs to have a knee replacement.  I think that she will be at low risk for her upcoming knee replacement.  It is okay for her to hold her aspirin for 5-7  days prior to the surgery and she may also hold her fish oil capsules if requested.  I will see her again in approximately 6 months for a follow-up office visit.  I have encouraged her to work on a good exercise program which she will need especially following her knee surgery.   2. LBBB:   Will continue to follow .  She will need periodic echoes   COVID-19 Education: The signs and symptoms of COVID-19 were discussed with the patient and how to seek care for testing (follow up with PCP or arrange E-visit).  The importance of social distancing was discussed today.  Time:   Today, I have  spent  25  minutes with the patient with telehealth technology discussing the above problems.     Medication Adjustments/Labs and Tests  Ordered: Current medicines are reviewed at length with the patient today.  Concerns regarding medicines are outlined above.   Tests Ordered: No orders of the defined types were placed in this encounter.   Medication Changes: No orders of the defined types were placed in this encounter.   Disposition:  Follow up in 6 month(s)  Signed, Mertie Moores, MD  05/30/2018 4:08 PM    [facility]

## 2018-06-03 ENCOUNTER — Other Ambulatory Visit: Payer: Self-pay | Admitting: General Surgery

## 2018-06-03 DIAGNOSIS — C4361 Malignant melanoma of right upper limb, including shoulder: Secondary | ICD-10-CM

## 2018-06-15 ENCOUNTER — Ambulatory Visit: Payer: BC Managed Care – PPO | Admitting: Internal Medicine

## 2018-06-15 ENCOUNTER — Other Ambulatory Visit: Payer: Self-pay

## 2018-06-15 ENCOUNTER — Encounter: Payer: Self-pay | Admitting: Internal Medicine

## 2018-06-15 VITALS — BP 124/80 | HR 64 | Temp 97.2°F | Resp 16 | Ht 61.0 in | Wt 148.0 lb

## 2018-06-15 DIAGNOSIS — I10 Essential (primary) hypertension: Secondary | ICD-10-CM | POA: Diagnosis not present

## 2018-06-15 DIAGNOSIS — R7309 Other abnormal glucose: Secondary | ICD-10-CM

## 2018-06-15 DIAGNOSIS — E782 Mixed hyperlipidemia: Secondary | ICD-10-CM

## 2018-06-15 DIAGNOSIS — Z79899 Other long term (current) drug therapy: Secondary | ICD-10-CM

## 2018-06-15 DIAGNOSIS — E559 Vitamin D deficiency, unspecified: Secondary | ICD-10-CM

## 2018-06-15 MED ORDER — TOPIRAMATE 50 MG PO TABS: ORAL_TABLET | ORAL | 1 refills | Status: DC

## 2018-06-15 MED ORDER — PHENTERMINE HCL 37.5 MG PO TABS: ORAL_TABLET | ORAL | 1 refills | Status: DC

## 2018-06-15 NOTE — Patient Instructions (Signed)

## 2018-06-15 NOTE — Progress Notes (Signed)
History of Present Illness:      This very nice 58 y.o. MWF  presents for 6 month follow up with HTN, HLD, Pre-Diabetes and Vitamin D Deficiency.       Patient is s/p Melanoma excision of the Rt arm (2019) .       Patient is treated for HTN (2014)& BP has been controlled at home. Today's BP is at goal - 124/80.  In 2016 and 2018, she had a Myoview showing EF 48% and low risk for Ischemia. In 2017, She had a Normal Cardiac MRI.  Patient has dx/o mild Diastolic cardiac dysfunction by 2D echo and LBBB by EKG.   Patient has had no complaints of any cardiac type chest pain, palpitations, dyspnea / orthopnea / PND, dizziness, claudication, or dependent edema.      Hyperlipidemia is controlled with diet & Atorvastatin. Patient denies myalgias or other med SE's. Last Lipids were at goal albeit elevated Trig's: Lab Results  Component Value Date   CHOL 167 03/10/2018   HDL 33 (L) 03/10/2018   LDLCALC  not calculated 03/10/2018   TRIG 439 (H) 03/10/2018   CHOLHDL 5.1 (H) 03/10/2018       Also, the patient has history of PreDiabetes (A1c 6.0% / 2012)  and has had no symptoms of reactive hypoglycemia, diabetic polys, paresthesias or visual blurring.  Last A1c was almost to goal: Lab Results  Component Value Date   HGBA1C 5.7 (H) 03/10/2018      Further, the patient also has history of Vitamin D Deficiency ("48" on tx-2008)  and supplements vitamin D without any suspected side-effects. Last vitamin D was not at goal: Lab Results  Component Value Date   VD25OH 41 11/05/2017   Current Outpatient Medications on File Prior to Visit  Medication Sig  . Adapalene-Benzoyl Peroxide (EPIDUO) 0.1-2.5 % gel Apply 1 application topically as needed (acne).  Marland Kitchen albuterol (PROVENTIL HFA;VENTOLIN HFA) 108 (90 Base) MCG/ACT inhaler Inhale 2 puffs into the lungs every 4 (four) hours as needed for wheezing or shortness of breath.  Marland Kitchen amLODipine (NORVASC) 5 MG tablet Take 1 tablet (5 mg total) by mouth daily.  Marland Kitchen  aspirin EC 81 MG tablet Take 1 tablet (81 mg total) by mouth daily.  Marland Kitchen atorvastatin (LIPITOR) 40 MG tablet Take 20 mg by mouth 3 (three) times a week.  . Cholecalciferol (VITAMIN D PO) Take 2,000 Units by mouth 3 (three) times daily.   Marland Kitchen escitalopram (LEXAPRO) 10 MG tablet TAKE 1 TABLET BY MOUTH EVERY DAY  . estradiol (ESTRACE) 0.5 MG tablet Take 0.5 mg by mouth daily.  . fluticasone (FLONASE) 50 MCG/ACT nasal spray USE 2 SPRAYS IN EACH NOSTRIL DAILY  . furosemide (LASIX) 40 MG tablet TAKE 1 TABLET DAILY FOR BP & FLUID  . medroxyPROGESTERone (PROVERA) 2.5 MG tablet Take 2.5 mg by mouth daily.  . metoprolol succinate (TOPROL-XL) 100 MG 24 hr tablet Take 50 mg by mouth daily. Take with or immediately following a meal.  . montelukast (SINGULAIR) 10 MG tablet TAKE 1 TABLET BY MOUTH EVERY DAY  . olmesartan (BENICAR) 40 MG tablet TAKE 1/2 TAB DAILY IF BP 140/90, TAKE FULL TAB  . Omega-3 Fatty Acids (FISH OIL PO) Take 1 capsule by mouth daily.  . valACYclovir (VALTREX) 500 MG tablet Take 500 mg by mouth 2 (two) times daily as needed (fever blister outbreak).   No current facility-administered medications on file prior to visit.    No Known Allergies  PMHx:   Past  Medical History:  Diagnosis Date  . Allergy   . Anxiety   . Asthma   . Cardiomyopathy ([place])    a. Echo 8/16:  EF 40-45%, apical and ant-septal HK, Gr 2 DD  . CHF (congestive heart failure) ([place])   . Diverticula, colon   . Family history of adverse reaction to anesthesia    sisiter had PONV  . Headache    rare migraines  . History of cardiovascular stress test    Myoview 8/16:  EF 48%, anterior and apical defect c/w breast atten, no ischemia; Low Risk // Nuclear stress test 9/18: Low risk stress nuclear study with fixed septal defect consistent with LBBB vs prior infarct; no ischemia; EF 53 with septal akinesis.  . Hyperlipidemia   . Hypertension   . Irregular periods/menstrual cycles   . LBBB (left bundle branch block)   .  Melanoma ([place])    right arm  . Seasonal allergies   . Stroke [facility])    TIA august 2016  . TIA (transient ischemic attack)   . Torn meniscus     Immunization History  Administered Date(s) Administered  . Influenza Inj Mdck Quad With Preservative 10/13/2016, 11/05/2017  . Influenza Split 10/24/2013  . Influenza,inj,quad, With Preservative 12/24/2015  . PPD Test 06/13/2013, 09/13/2015, 10/13/2016, 11/05/2017  . Pneumococcal Polysaccharide-23 02/12/2004  . Td 10/12/2004  . Tdap 01/09/2015    Past Surgical History:  Procedure Laterality Date  . ABLATION    . BREAST REDUCTION SURGERY    . CARDIAC CATHETERIZATION    . CESAREAN SECTION     x2  . KNEE SURGERY     partial knee replacement right  . MELANOMA EXCISION WITH SENTINEL LYMPH NODE BIOPSY Right 09/15/2017   Procedure: BIOPSY RIGHT SHOULDER MOLE AND WIDE LOCAL EXCISION OF RIGHT ARM MELANOMA, ADVANCEMENT FLAP CLOSURE, SENTINEL LYMPH NODE BIOPSY  RIGHT ARM MELANOMA;  Surgeon: Stark Klein, MD;  Location: [facility];  Service: General;  Laterality: Right;  . REPAIR QUADRICEPS / HAMSTRING MUSCLE    . TONSILLECTOMY    . tubes in ears  10/12    FHx:    Reviewed / unchanged  SHx:    Reviewed / unchanged   Systems Review:  Constitutional: Denies fever, chills, wt changes, headaches, insomnia, fatigue, night sweats, change in appetite. Eyes: Denies redness, blurred vision, diplopia, discharge, itchy, watery eyes.  ENT: Denies discharge, congestion, post nasal drip, epistaxis, sore throat, earache, hearing loss, dental pain, tinnitus, vertigo, sinus pain, snoring.  CV: Denies chest pain, palpitations, irregular heartbeat, syncope, dyspnea, diaphoresis, orthopnea, PND, claudication or edema. Respiratory: denies cough, dyspnea, DOE, pleurisy, hoarseness, laryngitis, wheezing.  Gastrointestinal: Denies dysphagia, odynophagia, heartburn, reflux, water brash, abdominal pain or cramps, nausea, vomiting, bloating, diarrhea,  constipation, hematemesis, melena, hematochezia  or hemorrhoids. Genitourinary: Denies dysuria, frequency, urgency, nocturia, hesitancy, discharge, hematuria or flank pain. Musculoskeletal: Denies arthralgias, myalgias, stiffness, jt. swelling, pain, limping or strain/sprain.  Skin: Denies pruritus, rash, hives, warts, acne, eczema or change in skin lesion(s). Neuro: No weakness, tremor, incoordination, spasms, paresthesia or pain. Psychiatric: Denies confusion, memory loss or sensory loss. Endo: Denies change in weight, skin or hair change.  Heme/Lymph: No excessive bleeding, bruising or enlarged lymph nodes.  Physical Exam  BP 124/80   Pulse 64   Temp (!) 97.2 F (36.2 C)   Resp 16   Ht 5\' 1"  (1.549 m)   Wt 148 lb (67.1 kg)   BMI 27.96 kg/m   Appears  well nourished, well groomed  and in no distress.  Eyes: PERRLA, EOMs, conjunctiva no swelling or erythema. Sinuses: No frontal/maxillary tenderness ENT/Mouth: EAC's clear, TM's nl w/o erythema, bulging. Nares clear w/o erythema, swelling, exudates. Oropharynx clear without erythema or exudates. Oral hygiene is good. Tongue normal, non obstructing. Hearing intact.  Neck: Supple. Thyroid not palpable. Car 2+/2+ without bruits, nodes or JVD. Chest: Respirations nl with BS clear & equal w/o rales, rhonchi, wheezing or stridor.  Cor: Heart sounds normal w/ regular rate and rhythm without sig. murmurs, gallops, clicks or rubs. Peripheral pulses normal and equal  without edema.  Abdomen: Soft & bowel sounds normal. Non-tender w/o guarding, rebound, hernias, masses or organomegaly.  Lymphatics: Unremarkable.  Musculoskeletal: Full ROM all peripheral extremities, joint stability, 5/5 strength and normal gait.  Skin: Warm, dry without exposed rashes, lesions or ecchymosis apparent.  Neuro: Cranial nerves intact, reflexes equal bilaterally. Sensory-motor testing grossly intact. Tendon reflexes grossly intact.  Pysch: Alert & oriented x 3.   Insight and judgement nl & appropriate. No ideations.  Assessment and Plan:   1. Essential hypertension  - Continue medication, monitor blood pressure at home.  - Continue DASH diet.  Reminder to go to the ER if any CP,  SOB, nausea, dizziness, severe HA, changes vision/speech.  - CBC with Differential/Platelet - COMPLETE METABOLIC PANEL WITH GFR - Magnesium - TSH  2. Hyperlipidemia, mixed  - Continue diet/meds, exercise,& lifestyle modifications.  - Continue monitor periodic cholesterol/liver & renal functions   - Lipid panel - TSH  3. Abnormal glucose  - Continue diet, exercise  - Lifestyle modifications.  - Monitor appropriate labs.  - Hemoglobin A1c - Insulin, random  4. Vitamin D deficiency  - Continue supplementation.  - VITAMIN D 25 Hydroxyl  5. Medication management  - CBC with Differential/Platelet - COMPLETE METABOLIC PANEL WITH GFR - Magnesium - Lipid panel - TSH - Hemoglobin A1c - Insulin, random - VITAMIN D 25 Hydroxyl  6. Class 2 severe obesity due to excess calories with serious comorbidity (HCC)  - phentermine (ADIPEX-P) 37.5 MG tablet; Take 1/2 to 1 tablet every Morning for Dieting & Weight Loss  Dispense: 90 tablet; Refill: 1  - topiramate (TOPAMAX) 50 MG tablet; Take 1/2 to 1 tablet 2 x /day at Suppertime & Bedtime for Dieting & Weight Loss  Dispense: 180 tablet; Refill: 1       Discussed  regular exercise, BP monitoring, weight control to achieve/maintain BMI less than 25 and discussed med and SE's. Recommended labs to assess and monitor clinical status with further disposition pending results of labs.  I provided over 30 minutes of exam, counseling, chart review and  complex critical decision making.       I discussed the assessment and treatment plan with the patient. The patient was provided an opportunity to ask questions and all were answered. The patient agreed with the plan and demonstrated an understanding of the instructions.         The patient was advised to call back or seek an in-person evaluation if the symptoms worsen or if the condition fails to improve as anticipated.       I provided  minutes of non-face-to-face time during this encounter and over 30 minutes of exam, counseling, chart review and  complex critical decision making was performed

## 2018-06-16 LAB — HEMOGLOBIN A1C
Hgb A1c MFr Bld: 5.7 % of total Hgb — ABNORMAL HIGH (ref <5.7)
Mean Plasma Glucose: 117 (calc)
eAG (mmol/L): 6.5 (calc)

## 2018-06-16 LAB — COMPLETE METABOLIC PANEL WITH GFR
AG Ratio: 2.1 (calc) (ref 1.0–2.5)
ALT: 40 U/L — ABNORMAL HIGH (ref 6–29)
AST: 29 U/L (ref 10–35)
Albumin: 4.4 g/dL (ref 3.6–5.1)
Alkaline phosphatase (APISO): 83 U/L (ref 37–153)
BUN: 13 mg/dL (ref 7–25)
CO2: 24 mmol/L (ref 20–32)
Calcium: 8.9 mg/dL (ref 8.6–10.4)
Chloride: 104 mmol/L (ref 98–110)
Creat: 0.8 mg/dL (ref 0.50–1.05)
GFR, Est African American: 95 mL/min/{1.73_m2} (ref >=60)
GFR, Est Non African American: 82 mL/min/{1.73_m2} (ref >=60)
Globulin: 2.1 g/dL (calc) (ref 1.9–3.7)
Glucose, Bld: 124 mg/dL — ABNORMAL HIGH (ref 65–99)
Potassium: 3.9 mmol/L (ref 3.5–5.3)
Sodium: 140 mmol/L (ref 135–146)
Total Bilirubin: 0.4 mg/dL (ref 0.2–1.2)
Total Protein: 6.5 g/dL (ref 6.1–8.1)

## 2018-06-16 LAB — LIPID PANEL
Cholesterol: 142 mg/dL (ref <200)
HDL: 34 mg/dL — ABNORMAL LOW (ref >=50)
LDL Cholesterol (Calc): 70 mg/dL (calc)
Non-HDL Cholesterol (Calc): 108 mg/dL (calc) (ref <130)
Total CHOL/HDL Ratio: 4.2 (calc) (ref <5.0)
Triglycerides: 312 mg/dL — ABNORMAL HIGH (ref <150)

## 2018-06-16 LAB — CBC WITH DIFFERENTIAL/PLATELET
Absolute Monocytes: 612 cells/uL (ref 200–950)
Basophils Absolute: 58 cells/uL (ref 0–200)
Basophils Relative: 0.8 %
Eosinophils Absolute: 223 cells/uL (ref 15–500)
Eosinophils Relative: 3.1 %
HCT: 44.5 % (ref 35.0–45.0)
Hemoglobin: 14.9 g/dL (ref 11.7–15.5)
Lymphs Abs: 2153 cells/uL (ref 850–3900)
MCH: 31 pg (ref 27.0–33.0)
MCHC: 33.5 g/dL (ref 32.0–36.0)
MCV: 92.7 fL (ref 80.0–100.0)
MPV: 13.1 fL — ABNORMAL HIGH (ref 7.5–12.5)
Monocytes Relative: 8.5 %
Neutro Abs: 4154 cells/uL (ref 1500–7800)
Neutrophils Relative %: 57.7 %
Platelets: 294 10*3/uL (ref 140–400)
RBC: 4.8 10*6/uL (ref 3.80–5.10)
RDW: 13.1 % (ref 11.0–15.0)
Total Lymphocyte: 29.9 %
WBC: 7.2 10*3/uL (ref 3.8–10.8)

## 2018-06-16 LAB — INSULIN, RANDOM: Insulin: 5.8 u[IU]/mL

## 2018-06-16 LAB — VITAMIN D 25 HYDROXY (VIT D DEFICIENCY, FRACTURES): Vit D, 25-Hydroxy: 72 ng/mL (ref 30–100)

## 2018-06-16 LAB — MAGNESIUM: Magnesium: 2 mg/dL (ref 1.5–2.5)

## 2018-06-16 LAB — TSH: TSH: 1.92 mIU/L (ref 0.40–4.50)

## 2018-06-21 DIAGNOSIS — Z20828 Contact with and (suspected) exposure to other viral communicable diseases: Secondary | ICD-10-CM | POA: Diagnosis not present

## 2018-06-22 ENCOUNTER — Ambulatory Visit (INDEPENDENT_AMBULATORY_CARE_PROVIDER_SITE_OTHER): Payer: BLUE CROSS/BLUE SHIELD | Admitting: Neurology

## 2018-06-22 ENCOUNTER — Other Ambulatory Visit: Payer: Self-pay

## 2018-06-22 DIAGNOSIS — I10 Essential (primary) hypertension: Secondary | ICD-10-CM

## 2018-06-22 DIAGNOSIS — G4763 Sleep related bruxism: Secondary | ICD-10-CM

## 2018-06-22 DIAGNOSIS — R5382 Chronic fatigue, unspecified: Secondary | ICD-10-CM

## 2018-06-22 DIAGNOSIS — I5022 Chronic systolic (congestive) heart failure: Secondary | ICD-10-CM

## 2018-06-22 DIAGNOSIS — G4710 Hypersomnia, unspecified: Secondary | ICD-10-CM

## 2018-06-22 DIAGNOSIS — G478 Other sleep disorders: Secondary | ICD-10-CM

## 2018-06-22 DIAGNOSIS — R0683 Snoring: Secondary | ICD-10-CM

## 2018-06-22 DIAGNOSIS — Z8673 Personal history of transient ischemic attack (TIA), and cerebral infarction without residual deficits: Secondary | ICD-10-CM

## 2018-06-24 ENCOUNTER — Ambulatory Visit
Admission: RE | Admit: 2018-06-24 | Discharge: 2018-06-24 | Disposition: A | Discharge status: Home / Self Care | Payer: BLUE CROSS/BLUE SHIELD | Source: Ambulatory Visit | Attending: General Surgery | Admitting: General Surgery

## 2018-06-24 ENCOUNTER — Ambulatory Visit
Admission: RE | Admit: 2018-06-24 | Discharge: 2018-06-24 | Disposition: A | Discharge status: Home / Self Care | Payer: BC Managed Care – PPO | Source: Ambulatory Visit | Attending: General Surgery | Admitting: General Surgery

## 2018-06-24 ENCOUNTER — Other Ambulatory Visit: Payer: Self-pay

## 2018-06-24 DIAGNOSIS — N6489 Other specified disorders of breast: Secondary | ICD-10-CM | POA: Diagnosis not present

## 2018-06-24 DIAGNOSIS — C4361 Malignant melanoma of right upper limb, including shoulder: Secondary | ICD-10-CM

## 2018-06-24 DIAGNOSIS — R922 Inconclusive mammogram: Secondary | ICD-10-CM | POA: Diagnosis not present
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
## 2018-07-15 ENCOUNTER — Other Ambulatory Visit: Payer: Self-pay | Admitting: Internal Medicine

## 2018-07-15 DIAGNOSIS — I10 Essential (primary) hypertension: Secondary | ICD-10-CM

## 2018-07-28 ENCOUNTER — Other Ambulatory Visit: Payer: Self-pay

## 2018-07-28 ENCOUNTER — Ambulatory Visit (INDEPENDENT_AMBULATORY_CARE_PROVIDER_SITE_OTHER): Payer: BC Managed Care – PPO | Admitting: Physician Assistant

## 2018-07-28 ENCOUNTER — Encounter: Payer: Self-pay | Admitting: Physician Assistant

## 2018-07-28 VITALS — BP 124/86 | HR 66 | Temp 97.9°F | Ht 61.0 in | Wt 145.4 lb

## 2018-07-28 DIAGNOSIS — R109 Unspecified abdominal pain: Secondary | ICD-10-CM

## 2018-07-28 MED ORDER — IBUPROFEN 800 MG PO TABS: 800.0000 mg | ORAL_TABLET | Freq: Three times a day (TID) | ORAL | 0 refills | Status: DC | PRN

## 2018-07-28 MED ORDER — KETOROLAC TROMETHAMINE 60 MG/2ML IM SOLN: 60.0000 mg | Freq: Once | INTRAMUSCULAR | Status: AC

## 2018-07-28 MED ORDER — TAMSULOSIN HCL 0.4 MG PO CAPS: 0.4000 mg | ORAL_CAPSULE | Freq: Every day | ORAL | 0 refills | Status: DC

## 2018-07-28 NOTE — Patient Instructions (Signed)
Push water Continue cranberry juice or lemon water  Will start you on flomax take at night Going to give toradol here in the office,  Continue ibuprofen 800mg  up to 3 x a day at home Message if you need something for nausea \  If any fever, chills, blood in urine, unable to urinate, can not hold down fluid/food, severe weakness, severe AB pain or worsening symptoms, patient will go to the ER.    Kidney Stones  Kidney stones (urolithiasis) are solid, rock-like deposits that form inside of the organs that make urine (kidneys). A kidney stone may form in a kidney and move into the bladder, where it can cause intense pain and block the flow of urine. Kidney stones are created when high levels of certain minerals are found in the urine. They are usually passed through urination, but in some cases, medical treatment may be needed to remove them. What are the causes? Kidney stones may be caused by:  A condition in which certain glands produce too much parathyroid hormone (primary hyperparathyroidism), which causes too much calcium buildup in the blood.  Buildup of uric acid crystals in the bladder (hyperuricosuria). Uric acid is a chemical that the body produces when you eat certain foods. It usually exits the body in the urine.  Narrowing (stricture) of one or both of the tubes that drain urine from the kidneys to the bladder (ureters).  A kidney blockage that is present at birth (congenital obstruction).  Past surgery on the kidney or the ureters, such as gastric bypass surgery. What increases the risk? The following factors make you more likely to develop kidney stones:  Having had a kidney stone in the past.  Having a family history of kidney stones.  Not drinking enough water.  Eating a diet that is high in protein, salt (sodium), or sugar.  Being overweight or obese. What are the signs or symptoms? Symptoms of a kidney stone may include:  Nausea.  Vomiting.  Blood in the  urine (hematuria).  Pain in the side of the abdomen, right below the ribs (flank pain). Pain usually spreads (radiates) to the groin.  Needing to urinate frequently or urgently. How is this diagnosed? This condition may be diagnosed based on:  Your medical history.  A physical exam.  Blood tests.  Urine tests.  CT scan.  Abdominal X-ray.  A procedure to examine the inside of the bladder (cystoscopy). How is this treated? Treatment for kidney stones depends on the size, location, and makeup of the stones. Treatment may involve:  Analyzing your urine before and after you pass the stone through urination.  Being monitored at the hospital until you pass the stone through urination.  Increasing your fluid intake and decreasing the amount of calcium and protein in your diet.  A procedure to break up kidney stones in the bladder using: ? A focused beam of light (laser therapy). ? Shock waves (extracorporeal shock wave lithotripsy).  Surgery to remove kidney stones. This may be needed if you have severe pain or have stones that block your urinary tract. Follow these instructions at home: Eating and drinking  Drink enough fluid to keep your urine clear or pale yellow. This will help you to pass the kidney stone.  If directed, change your diet. This may include: ? Limiting how much sodium you eat. ? Eating more fruits and vegetables. ? Limiting how much meat, poultry, fish, and eggs you eat.  Follow instructions from your health care provider about eating  or drinking restrictions. General instructions  Collect urine samples as told by your health care provider. You may need to collect a urine sample: ? 24 hours after you pass the stone. ? 8-12 weeks after passing the kidney stone, and every 6-12 months after that.  Strain your urine every time you urinate, for as long as directed. Use the strainer that your health care provider recommends.  Do not throw out the kidney  stone after passing it. Keep the stone so it can be tested by your health care provider. Testing the makeup of your kidney stone may help prevent you from getting kidney stones in the future.  Take over-the-counter and prescription medicines only as told by your health care provider.  Keep all follow-up visits as told by your health care provider. This is important. You may need follow-up X-rays or ultrasounds to make sure that your stone has passed. How is this prevented? To prevent another kidney stone:  Drink enough fluid to keep your urine clear or pale yellow. This is the best way to prevent kidney stones.  Eat a healthy diet and follow recommendations from your health care provider about foods to avoid. You may be instructed to eat a low-protein diet. Recommendations vary depending on the type of kidney stone that you have.  Maintain a healthy weight. Contact a health care provider if:  You have pain that gets worse or does not get better with medicine. Get help right away if:  You have a fever or chills.  You develop severe pain.  You develop new abdominal pain.  You faint.  You are unable to urinate. This information is not intended to replace advice given to you by your health care provider. Make sure you discuss any questions you have with your health care provider. Document Released: 12/29/2004 Document Revised: 08/10/2017 Document Reviewed: 06/14/2015 Elsevier Patient Education  2020 [employer].

## 2018-07-28 NOTE — Progress Notes (Signed)
Subjective:    [name], female    DOB: [birth year], 59 y.o.   MRN: [id number]  HPI 58 y.o. WF that has remote history of kidney stones 2003 presents with left flank pain x 2 days. She has had intermittent knife like stabbing pain x 2 days, not positional, better with 800mg  ibuprofen. Some nausea Tuesday, no vomiting, some frequency, no other urinary symptoms. She denies chills, fever,  hematuria, lower abdominal pain and urinary incontinence.    Lab Results  Component Value Date   GFRNONAA 82 06/15/2018    Blood pressure 124/86, pulse 66, temperature 97.9 F (36.6 C), height 5\' 1"  (1.549 m), weight 145 lb 6.4 oz (66 kg), SpO2 97 %.  Medications Current Outpatient Medications on File Prior to Visit  Medication Sig  . Adapalene-Benzoyl Peroxide (EPIDUO) 0.1-2.5 % gel Apply 1 application topically as needed (acne).  Marland Kitchen albuterol (PROVENTIL HFA;VENTOLIN HFA) 108 (90 Base) MCG/ACT inhaler Inhale 2 puffs into the lungs every 4 (four) hours as needed for wheezing or shortness of breath.  Marland Kitchen amLODipine (NORVASC) 5 MG tablet Take 1 tablet (5 mg total) by mouth daily.  Marland Kitchen aspirin EC 81 MG tablet Take 1 tablet (81 mg total) by mouth daily.  Marland Kitchen atorvastatin (LIPITOR) 40 MG tablet Take 20 mg by mouth 3 (three) times a week.  . Cholecalciferol (VITAMIN D PO) Take 2,000 Units by mouth 3 (three) times daily.   Marland Kitchen escitalopram (LEXAPRO) 10 MG tablet TAKE 1 TABLET BY MOUTH EVERY DAY  . estradiol (ESTRACE) 0.5 MG tablet Take 0.5 mg by mouth daily.  . fluticasone (FLONASE) 50 MCG/ACT nasal spray USE 2 SPRAYS IN EACH NOSTRIL DAILY  . furosemide (LASIX) 40 MG tablet TAKE 1 TABLET DAILY FOR BP & FLUID  . medroxyPROGESTERone (PROVERA) 2.5 MG tablet Take 2.5 mg by mouth daily.  . metoprolol succinate (TOPROL-XL) 100 MG 24 hr tablet Take 50 mg by mouth daily. Take with or immediately following a meal.  . montelukast (SINGULAIR) 10 MG tablet TAKE 1 TABLET BY MOUTH EVERY DAY  . olmesartan (BENICAR)  40 MG tablet TAKE 1/2 TABLET BY MOUTH EVERY DAY IF BLOOD PRESSURE 140/90, TAKE 1 FULL TALBET  . Omega-3 Fatty Acids (FISH OIL PO) Take 1 capsule by mouth daily.  . phentermine (ADIPEX-P) 37.5 MG tablet Take 1/2 to 1 tablet every Morning for Dieting & Weight Loss  . topiramate (TOPAMAX) 50 MG tablet Take 1/2 to 1 tablet 2 x /day at Suppertime & Bedtime for Dieting & Weight Loss  . valACYclovir (VALTREX) 500 MG tablet Take 500 mg by mouth 2 (two) times daily as needed (fever blister outbreak).   No current facility-administered medications on file prior to visit.     Problem list She has HTN; Hyperlipidemia; Vitamin D deficiency; Asthma; Medication management; History of TIA (transient ischemic attack); Other abnormal glucose; BMI 99.8-33.8,SNKNL; Chronic systolic CHF (congestive heart failure) ([place]); Cognitive changes; Generalized anxiety disorder; Melanoma of left upper arm ([place]); Bruxism, sleep-related; Snoring; Chronic fatigue; Non-restorative sleep; and Chronic systolic congestive heart failure, NYHA class 2 (HCC) on their problem list.    Review of Systems  Constitutional: Negative for chills.  HENT: Negative.   Respiratory: Negative.   Cardiovascular: Negative.   Gastrointestinal: Negative.  Negative for nausea and vomiting.  Genitourinary: Positive for flank pain and frequency. Negative for decreased urine volume, difficulty urinating, dyspareunia, dysuria, enuresis, genital sores, hematuria, menstrual problem, pelvic pain, vaginal bleeding and vaginal discharge.       Objective:  Physical Exam Constitutional:      Appearance: She is well-developed.  Neck:     Musculoskeletal: Normal range of motion and neck supple.  Cardiovascular:     Rate and Rhythm: Normal rate and regular rhythm.  Pulmonary:     Effort: Pulmonary effort is normal.     Breath sounds: Normal breath sounds.  Abdominal:     General: Bowel sounds are normal. There is no distension.     Palpations: Abdomen  is soft. There is no mass.     Tenderness: There is no abdominal tenderness. There is left CVA tenderness. There is no right CVA tenderness, guarding or rebound.  Musculoskeletal: Normal range of motion.        General: No tenderness.  Skin:    General: Skin is warm and dry.  Neurological:     Mental Status: She is alert and oriented to person, place, and time.        Assessment & Plan:  Gail Bailey was seen today for acute visit.  Diagnoses and all orders for this visit:  Left flank pain -     CBC with Differential/Platelet -     COMPLETE METABOLIC PANEL WITH GFR -     Urinalysis, Routine w reflex microscopic -     Urine Culture -     ibuprofen (ADVIL) 800 MG tablet; Take 1 tablet (800 mg total) by mouth every 8 (eight) hours as needed for cramping. -     ketorolac (TORADOL) injection 60 mg   If any fever, chills, blood in urine, unable to urinate, severe AB pain or worsening symptoms, patient will go to the ER.  Will get CT scan if pain is not better and refer to urology for possible intervention

## 2018-07-29 ENCOUNTER — Other Ambulatory Visit: Payer: Self-pay | Admitting: Internal Medicine

## 2018-07-30 LAB — URINALYSIS, ROUTINE W REFLEX MICROSCOPIC
Bilirubin Urine: NEGATIVE
Glucose, UA: NEGATIVE
Hgb urine dipstick: NEGATIVE
Ketones, ur: NEGATIVE
Leukocytes,Ua: NEGATIVE
Nitrite: NEGATIVE
Protein, ur: NEGATIVE
Specific Gravity, Urine: 1.006 (ref 1.001–1.03)
pH: 6 (ref 5.0–8.0)

## 2018-07-30 LAB — COMPLETE METABOLIC PANEL WITH GFR
AG Ratio: 1.7 (calc) (ref 1.0–2.5)
ALT: 21 U/L (ref 6–29)
AST: 19 U/L (ref 10–35)
Albumin: 4 g/dL (ref 3.6–5.1)
Alkaline phosphatase (APISO): 64 U/L (ref 37–153)
BUN: 14 mg/dL (ref 7–25)
CO2: 26 mmol/L (ref 20–32)
Calcium: 8.9 mg/dL (ref 8.6–10.4)
Chloride: 102 mmol/L (ref 98–110)
Creat: 0.86 mg/dL (ref 0.50–1.05)
GFR, Est African American: 87 mL/min/{1.73_m2} (ref >=60)
GFR, Est Non African American: 75 mL/min/{1.73_m2} (ref >=60)
Globulin: 2.3 g/dL (calc) (ref 1.9–3.7)
Glucose, Bld: 94 mg/dL (ref 65–99)
Potassium: 3.4 mmol/L — ABNORMAL LOW (ref 3.5–5.3)
Sodium: 138 mmol/L (ref 135–146)
Total Bilirubin: 0.4 mg/dL (ref 0.2–1.2)
Total Protein: 6.3 g/dL (ref 6.1–8.1)

## 2018-07-30 LAB — URINE CULTURE
MICRO NUMBER:: 677567
Result:: NO GROWTH
SPECIMEN QUALITY:: ADEQUATE

## 2018-07-30 LAB — CBC WITH DIFFERENTIAL/PLATELET
Absolute Monocytes: 544 cells/uL (ref 200–950)
Basophils Absolute: 51 cells/uL (ref 0–200)
Basophils Relative: 0.6 %
Eosinophils Absolute: 111 cells/uL (ref 15–500)
Eosinophils Relative: 1.3 %
HCT: 39.4 % (ref 35.0–45.0)
Hemoglobin: 13.4 g/dL (ref 11.7–15.5)
Lymphs Abs: 2278 cells/uL (ref 850–3900)
MCH: 31.5 pg (ref 27.0–33.0)
MCHC: 34 g/dL (ref 32.0–36.0)
MCV: 92.7 fL (ref 80.0–100.0)
MPV: 12.9 fL — ABNORMAL HIGH (ref 7.5–12.5)
Monocytes Relative: 6.4 %
Neutro Abs: 5517 cells/uL (ref 1500–7800)
Neutrophils Relative %: 64.9 %
Platelets: 328 10*3/uL (ref 140–400)
RBC: 4.25 10*6/uL (ref 3.80–5.10)
RDW: 13.8 % (ref 11.0–15.0)
Total Lymphocyte: 26.8 %
WBC: 8.5 10*3/uL (ref 3.8–10.8)

## 2018-08-19 ENCOUNTER — Other Ambulatory Visit: Payer: Self-pay | Admitting: Physician Assistant

## 2018-08-20 ENCOUNTER — Other Ambulatory Visit: Payer: Self-pay | Admitting: Physician Assistant

## 2018-08-20 DIAGNOSIS — R109 Unspecified abdominal pain: Secondary | ICD-10-CM

## 2018-08-23 DIAGNOSIS — Z01818 Encounter for other preprocedural examination: Secondary | ICD-10-CM | POA: Diagnosis not present

## 2018-08-23 DIAGNOSIS — Z79899 Other long term (current) drug therapy: Secondary | ICD-10-CM | POA: Diagnosis not present

## 2018-08-23 DIAGNOSIS — M1712 Unilateral primary osteoarthritis, left knee: Secondary | ICD-10-CM | POA: Diagnosis not present

## 2018-08-23 DIAGNOSIS — M170 Bilateral primary osteoarthritis of knee: Secondary | ICD-10-CM | POA: Diagnosis not present

## 2018-08-23 DIAGNOSIS — R001 Bradycardia, unspecified: Secondary | ICD-10-CM | POA: Diagnosis not present

## 2018-08-27 ENCOUNTER — Other Ambulatory Visit: Payer: Self-pay | Admitting: Internal Medicine

## 2018-08-31 DIAGNOSIS — Z20828 Contact with and (suspected) exposure to other viral communicable diseases: Secondary | ICD-10-CM | POA: Diagnosis not present

## 2018-09-01 DIAGNOSIS — M9901 Segmental and somatic dysfunction of cervical region: Secondary | ICD-10-CM | POA: Diagnosis not present

## 2018-09-10 ENCOUNTER — Other Ambulatory Visit: Payer: Self-pay | Admitting: Internal Medicine

## 2018-09-12 NOTE — Progress Notes (Signed)
Assessment and Plan:   Essential hypertension - continue medications, DASH diet, exercise and monitor at home. Call if greater than 130/80.  -     CBC with Differential/Platelet -     CMP/GFR -     TSH  Chronic systolic CHF (congestive heart failure) ([place]) Weights stable/down; continue to monitor daily Continue current medications Continue to limit sodium, fluid Follow up cardiology  Hyperlipidemia -continue medications, check lipids, decrease fatty foods, increase activity.  -     Lipid panel  Medication management -     Magnesium  Prediabetes Discussed disease and risks Discussed diet/exercise, weight management  A1C at CPE; monitor serum glucose, weight  Mild sleep apnea Has new oral device and reports tolerating well   Melanoma of upper arm Continue follow up with surgeon and dermatology  Overweight - BMI 26 Long discussion about weight loss, diet, and exercise Recommended diet heavy in fruits and veggies and low in animal meats, cheeses, and dairy products, appropriate calorie intake Patient will continue with lifestyle changes D/c meds as excellent progress with lifestyle and possible SE Discussed appropriate weight for height and initial goal (<135lb) Follow up at next visit   Continue diet and meds as discussed. Further disposition pending results of labs. Future Appointments  Date Time Provider [place]  12/26/2018 11:00 AM Unk Pinto, MD [facility] None    HPI 58 y.o. female  presents for 3 month follow up with hypertension, hyperlipidemia, prediabetes and vitamin D.   She is getting knee replacement on left knee in Sept 8 with Dr. Okey Dupre at [facility];   She has been evaluated by Dr. Brett Fairy for fatigue/non-restorative sleep, underwent sleep study in June 2020 and was found to have mild sleep apnea and recommended oral device. Has gotten via dentist and tolerating well.   She had melanoma of R upper arm removed by Dr. Barry Dienes in 09/2017,  continues to follow q37m alternating with dermatology specialist and surgeon with plan to continue for 2 years.   She has a history of asthma, currently managed on singulair and PRN albuterol but hasn't had to use recently.  She also takes zyrtec daily, does alternate with claritin  She is on lexapro for anxiety/hot flashes. On HRT via OBGYN.   Her blood pressure has been controlled at home, today their BP is BP: 122/74 She does workout. She denies chest pain, shortness of breath, dizziness.  She has a history of CHF, last echo 2016, stress test 09/2016, EF 40-45% with grade 2 diastolic dysfunction.  She reports swelling is improved.   she is prescribed phentermine and topamax for weight loss but reports noted increased swelling with medications and stopped taking. While on the medication they have lost 4 lbs since last visit. After discussion will d/c as she is making excellent progress with lifestyle only and very motivated to continue.   BMI is Body mass index is 26.64 kg/m., she is working on diet and exercise. Wt Readings from Last 3 Encounters:  09/15/18 141 lb (64 kg)  07/28/18 145 lb 6.4 oz (66 kg)  06/15/18 148 lb (67.1 kg)   She is on cholesterol medication, lipitor 20 mg 3 x a week and denies myalgias. Her cholesterol is at goal, trigs remain elevated. The cholesterol last visit was:   Lab Results  Component Value Date   CHOL 142 06/15/2018   HDL 34 (L) 06/15/2018   LDLCALC 70 06/15/2018   TRIG 312 (H) 06/15/2018   CHOLHDL 4.2 06/15/2018   She has  been working on diet and exercise for prediabetes, and denies paresthesia of the feet, polydipsia and polyuria. Last A1C in the office was:  Lab Results  Component Value Date   HGBA1C 5.7 (H) 06/15/2018   Patient is on Vitamin D supplement.   Lab Results  Component Value Date   VD25OH 72 06/15/2018     Current Medications:   Current Outpatient Medications (Endocrine & Metabolic):  .  estradiol (ESTRACE) 0.5 MG tablet,  Take 0.5 mg by mouth daily. .  medroxyPROGESTERone (PROVERA) 2.5 MG tablet, Take 2.5 mg by mouth daily.  Current Outpatient Medications (Cardiovascular):  .  amLODipine (NORVASC) 5 MG tablet, Take 1 tablet (5 mg total) by mouth daily. Marland Kitchen  atorvastatin (LIPITOR) 40 MG tablet, Take 20 mg by mouth 3 (three) times a week. .  furosemide (LASIX) 40 MG tablet, TAKE 1 TABLET BY MOUTH EVERY DAY FOR BLOOD PRESSURE AND FLUID .  metoprolol succinate (TOPROL-XL) 100 MG 24 hr tablet, Take 1/2 to 1 tablet Daily for BP .  olmesartan (BENICAR) 40 MG tablet, TAKE 1/2 TABLET BY MOUTH EVERY DAY IF BLOOD PRESSURE 140/90, TAKE 1 FULL TALBET  Current Outpatient Medications (Respiratory):  .  albuterol (PROVENTIL HFA;VENTOLIN HFA) 108 (90 Base) MCG/ACT inhaler, Inhale 2 puffs into the lungs every 4 (four) hours as needed for wheezing or shortness of breath. .  cetirizine (ZYRTEC) 10 MG tablet, Take 10 mg by mouth daily. .  fluticasone (FLONASE) 50 MCG/ACT nasal spray, USE 2 SPRAYS IN EACH NOSTRIL DAILY .  montelukast (SINGULAIR) 10 MG tablet, TAKE 1 TABLET BY MOUTH EVERY DAY  Current Outpatient Medications (Analgesics):  .  aspirin EC 81 MG tablet, Take 1 tablet (81 mg total) by mouth daily. Marland Kitchen  ibuprofen (ADVIL) 800 MG tablet, Take 1 tablet 3 x /day with Food if needed for Cramping   Current Outpatient Medications (Other):  Marland Kitchen  Adapalene-Benzoyl Peroxide (EPIDUO) 0.1-2.5 % gel, Apply 1 application topically as needed (acne). .  Cholecalciferol (VITAMIN D PO), Take 5,000 Units by mouth daily.  Marland Kitchen  escitalopram (LEXAPRO) 10 MG tablet, TAKE 1 TABLET BY MOUTH EVERY DAY .  Omega-3 Fatty Acids (FISH OIL PO), Take 1 capsule by mouth daily. .  valACYclovir (VALTREX) 500 MG tablet, Take 500 mg by mouth 2 (two) times daily as needed (fever blister outbreak). .  phentermine (ADIPEX-P) 37.5 MG tablet, Take 1/2 to 1 tablet every Morning for Dieting & Weight Loss (Patient not taking: Reported on 09/15/2018) .  topiramate (TOPAMAX)  50 MG tablet, Take 1/2 to 1 tablet 2 x /day at Suppertime & Bedtime for Dieting & Weight Loss  Medical History:  Past Medical History:  Diagnosis Date  . Allergy   . Anxiety   . Asthma   . Cardiomyopathy ([place])    a. Echo 8/16:  EF 40-45%, apical and ant-septal HK, Gr 2 DD  . CHF (congestive heart failure) ([place])   . Diverticula, colon   . Family history of adverse reaction to anesthesia    sisiter had PONV  . Headache    rare migraines  . History of cardiovascular stress test    Myoview 8/16:  EF 48%, anterior and apical defect c/w breast atten, no ischemia; Low Risk // Nuclear stress test 9/18: Low risk stress nuclear study with fixed septal defect consistent with LBBB vs prior infarct; no ischemia; EF 53 with septal akinesis.  . Hyperlipidemia   . Hypertension   . Irregular periods/menstrual cycles   . LBBB (left bundle branch  block)   . Melanoma ([place])    right arm  . Seasonal allergies   . Stroke [facility])    TIA august 2016  . TIA (transient ischemic attack)   . Torn meniscus    Allergies:  No Known Allergies   Review of Systems  Constitutional: Negative for malaise/fatigue and weight loss.  HENT: Negative.  Negative for hearing loss and tinnitus.   Eyes: Negative.  Negative for blurred vision and double vision.  Respiratory: Negative.  Negative for cough, shortness of breath and wheezing.   Cardiovascular: Negative.  Negative for chest pain, palpitations, orthopnea, claudication and leg swelling.  Gastrointestinal: Negative.  Negative for abdominal pain, blood in stool, constipation, diarrhea, heartburn, melena, nausea and vomiting.  Genitourinary: Negative.   Musculoskeletal: Positive for joint pain (left knee; scheduled for TKA ). Negative for myalgias.  Skin: Negative.  Negative for rash.  Neurological: Negative.  Negative for dizziness, tingling, sensory change, weakness and headaches.  Endo/Heme/Allergies: Negative.  Negative for polydipsia.  Psychiatric/Behavioral:  Negative.   All other systems reviewed and are negative.    Family history- Review and unchanged Social history- Review and unchanged Physical Exam: BP 122/74   Pulse 62   Temp (!) 97 F (36.1 C)   Ht 5\' 1"  (1.549 m)   Wt 141 lb (64 kg)   SpO2 98%   BMI 26.64 kg/m  Wt Readings from Last 3 Encounters:  09/15/18 141 lb (64 kg)  07/28/18 145 lb 6.4 oz (66 kg)  06/15/18 148 lb (67.1 kg)   General Appearance: Well nourished, in no apparent distress. Eyes: PERRLA, EOMs, conjunctiva no swelling or erythema Sinuses: No Frontal/maxillary tenderness ENT/Mouth: Ext aud canals clear, TMs without erythema, bulging. No erythema, swelling, or exudate on post pharynx.  Tonsils not swollen or erythematous. Hearing normal.  Neck: Supple, thyroid normal.  Respiratory: Respiratory effort normal, BS equal bilaterally without rales, rhonchi, wheezing or stridor.  Cardio: RRR with no MRGs. Brisk peripheral pulses without edema.  Abdomen: Soft, + BS.  Non tender, no guarding, rebound, hernias, masses. Lymphatics: Non tender without lymphadenopathy.  Musculoskeletal: Full ROM excepting L knee, mild medial effusion, 5/5 strength, antalgic gait.  Skin: Warm, dry without rashes, lesions, ecchymosis.  Neuro: Cranial nerves intact. Normal muscle tone, no cerebellar symptoms. Sensation intact.  Psych: Awake and oriented X 3, normal affect, Insight and Judgment appropriate.    Gail Bailey 10:31 AM

## 2018-09-14 DIAGNOSIS — G4730 Sleep apnea, unspecified: Secondary | ICD-10-CM | POA: Insufficient documentation

## 2018-09-15 ENCOUNTER — Other Ambulatory Visit: Payer: Self-pay

## 2018-09-15 ENCOUNTER — Encounter: Payer: Self-pay | Admitting: Adult Health

## 2018-09-15 ENCOUNTER — Ambulatory Visit: Payer: BC Managed Care – PPO | Admitting: Adult Health

## 2018-09-15 VITALS — BP 122/74 | HR 62 | Temp 97.0°F | Ht 61.0 in | Wt 141.0 lb

## 2018-09-15 DIAGNOSIS — I5022 Chronic systolic (congestive) heart failure: Secondary | ICD-10-CM

## 2018-09-15 DIAGNOSIS — F411 Generalized anxiety disorder: Secondary | ICD-10-CM | POA: Diagnosis not present

## 2018-09-15 DIAGNOSIS — E782 Mixed hyperlipidemia: Secondary | ICD-10-CM

## 2018-09-15 DIAGNOSIS — Z79899 Other long term (current) drug therapy: Secondary | ICD-10-CM

## 2018-09-15 DIAGNOSIS — Z6825 Body mass index (BMI) 25.0-25.9, adult: Secondary | ICD-10-CM

## 2018-09-15 DIAGNOSIS — R7309 Other abnormal glucose: Secondary | ICD-10-CM

## 2018-09-15 DIAGNOSIS — I10 Essential (primary) hypertension: Secondary | ICD-10-CM | POA: Diagnosis not present

## 2018-09-15 DIAGNOSIS — E559 Vitamin D deficiency, unspecified: Secondary | ICD-10-CM

## 2018-09-15 DIAGNOSIS — C4362 Malignant melanoma of left upper limb, including shoulder: Secondary | ICD-10-CM

## 2018-09-15 DIAGNOSIS — G4730 Sleep apnea, unspecified: Secondary | ICD-10-CM

## 2018-09-15 MED ORDER — ALBUTEROL SULFATE HFA 108 (90 BASE) MCG/ACT IN AERS: 2.0000 | INHALATION_SPRAY | RESPIRATORY_TRACT | 1 refills | Status: DC | PRN

## 2018-09-15 MED ORDER — ATORVASTATIN CALCIUM 20 MG PO TABS: 20.0000 mg | ORAL_TABLET | ORAL | 3 refills | Status: DC

## 2018-09-15 NOTE — Patient Instructions (Signed)
Goals    . Weight (lb) < 135 lb (61.2 kg)       If jumpy legs continue after your surgery, we can check iron levels and perhaps try adding gabapentin for a short period which helps with pain management as well as restless legs symptoms  We'd like to see you for a hospital follow up within two weeks of your surgery to recheck any labs (common to have anemia, electrolyte abnormalities following surgery).   Restless Legs Syndrome Restless legs syndrome is a condition that causes uncomfortable feelings or sensations in the legs, especially while sitting or lying down. The sensations usually cause an overwhelming urge to move the legs. The arms can also sometimes be affected. The condition can range from mild to severe. The symptoms often interfere with a person's ability to sleep. What are the causes? The cause of this condition is not known. What increases the risk? The following factors may make you more likely to develop this condition:  Being older than 50.  Pregnancy.  Being a woman. In general, the condition is more common in women than in men.  A family history of the condition.  Having iron deficiency.  Overuse of caffeine, nicotine, or alcohol.  Certain medical conditions, such as kidney disease, Parkinson's disease, or nerve damage.  Certain medicines, such as those for high blood pressure, nausea, colds, allergies, depression, and some heart conditions. What are the signs or symptoms? The main symptom of this condition is uncomfortable sensations in the legs, such as:  Pulling.  Tingling.  Prickling.  Throbbing.  Crawling.  Burning. Usually, the sensations:  Affect both sides of the body.  Are worse when you sit or lie down.  Are worse at night. These may wake you up or make it difficult to fall asleep.  Make you have a strong urge to move your legs.  Are temporarily relieved by moving your legs. The arms can also be affected, but this is rare. People  who have this condition often have tiredness during the day because of their lack of sleep at night. How is this diagnosed? This condition may be diagnosed based on:  Your symptoms.  Blood tests. In some cases, you may be monitored in a sleep lab by a specialist (a sleep study). This can detect any disruptions in your sleep. How is this treated? This condition is treated by managing the symptoms. This may include:  Lifestyle changes, such as exercising, using relaxation techniques, and avoiding caffeine, alcohol, or tobacco.  Medicines. Anti-seizure medicines may be tried first. Follow these instructions at home:     General instructions  Take over-the-counter and prescription medicines only as told by your health care provider.  Use methods to help relieve the uncomfortable sensations, such as: ? Massaging your legs. ? Walking or stretching. ? Taking a cold or hot bath.  Keep all follow-up visits as told by your health care provider. This is important. Lifestyle  Practice good sleep habits. For example, go to bed and get up at the same time every day. Most adults should get 7-9 hours of sleep each night.  Exercise regularly. Try to get at least 30 minutes of exercise most days of the week.  Practice ways of relaxing, such as yoga or meditation.  Avoid caffeine and alcohol.  Do not use any products that contain nicotine or tobacco, such as cigarettes and e-cigarettes. If you need help quitting, ask your health care provider. Contact a health care provider if:  Your  symptoms get worse or they do not improve with treatment. Summary  Restless legs syndrome is a condition that causes uncomfortable feelings or sensations in the legs, especially while sitting or lying down.  The symptoms often interfere with a person's ability to sleep.  This condition is treated by managing the symptoms. You may need to make lifestyle changes or take medicines. This information is not  intended to replace advice given to you by your health care provider. Make sure you discuss any questions you have with your health care provider. Document Released: 12/19/2001 Document Revised: 01/18/2017 Document Reviewed: 01/18/2017 Elsevier Patient Education  2020 [employer].

## 2018-09-16 LAB — CBC WITH DIFFERENTIAL/PLATELET
Absolute Monocytes: 509 cells/uL (ref 200–950)
Basophils Absolute: 60 cells/uL (ref 0–200)
Basophils Relative: 0.9 %
Eosinophils Absolute: 168 cells/uL (ref 15–500)
Eosinophils Relative: 2.5 %
HCT: 43.6 % (ref 35.0–45.0)
Hemoglobin: 14.5 g/dL (ref 11.7–15.5)
Lymphs Abs: 1809 cells/uL (ref 850–3900)
MCH: 31.5 pg (ref 27.0–33.0)
MCHC: 33.3 g/dL (ref 32.0–36.0)
MCV: 94.6 fL (ref 80.0–100.0)
MPV: 13.1 fL — ABNORMAL HIGH (ref 7.5–12.5)
Monocytes Relative: 7.6 %
Neutro Abs: 4154 cells/uL (ref 1500–7800)
Neutrophils Relative %: 62 %
Platelets: 308 10*3/uL (ref 140–400)
RBC: 4.61 10*6/uL (ref 3.80–5.10)
RDW: 13 % (ref 11.0–15.0)
Total Lymphocyte: 27 %
WBC: 6.7 10*3/uL (ref 3.8–10.8)

## 2018-09-16 LAB — COMPLETE METABOLIC PANEL WITH GFR
AG Ratio: 1.8 (calc) (ref 1.0–2.5)
ALT: 25 U/L (ref 6–29)
AST: 23 U/L (ref 10–35)
Albumin: 4.1 g/dL (ref 3.6–5.1)
Alkaline phosphatase (APISO): 76 U/L (ref 37–153)
BUN: 11 mg/dL (ref 7–25)
CO2: 25 mmol/L (ref 20–32)
Calcium: 9 mg/dL (ref 8.6–10.4)
Chloride: 101 mmol/L (ref 98–110)
Creat: 0.76 mg/dL (ref 0.50–1.05)
GFR, Est African American: 101 mL/min/{1.73_m2} (ref >=60)
GFR, Est Non African American: 87 mL/min/{1.73_m2} (ref >=60)
Globulin: 2.3 g/dL (calc) (ref 1.9–3.7)
Glucose, Bld: 106 mg/dL — ABNORMAL HIGH (ref 65–99)
Potassium: 3.9 mmol/L (ref 3.5–5.3)
Sodium: 137 mmol/L (ref 135–146)
Total Bilirubin: 0.6 mg/dL (ref 0.2–1.2)
Total Protein: 6.4 g/dL (ref 6.1–8.1)

## 2018-09-16 LAB — LIPID PANEL
Cholesterol: 166 mg/dL (ref <200)
HDL: 31 mg/dL — ABNORMAL LOW (ref >=50)
Non-HDL Cholesterol (Calc): 135 mg/dL (calc) — ABNORMAL HIGH (ref <130)
Total CHOL/HDL Ratio: 5.4 (calc) — ABNORMAL HIGH (ref <5.0)
Triglycerides: 412 mg/dL — ABNORMAL HIGH (ref <150)

## 2018-09-16 LAB — TSH: TSH: 1.27 mIU/L (ref 0.40–4.50)

## 2018-09-16 LAB — MAGNESIUM: Magnesium: 2 mg/dL (ref 1.5–2.5)

## 2018-09-18 DIAGNOSIS — Z20828 Contact with and (suspected) exposure to other viral communicable diseases: Secondary | ICD-10-CM | POA: Diagnosis not present

## 2018-09-18 DIAGNOSIS — I110 Hypertensive heart disease with heart failure: Secondary | ICD-10-CM | POA: Diagnosis not present

## 2018-09-18 DIAGNOSIS — G8918 Other acute postprocedural pain: Secondary | ICD-10-CM | POA: Diagnosis not present

## 2018-09-18 DIAGNOSIS — I447 Left bundle-branch block, unspecified: Secondary | ICD-10-CM | POA: Diagnosis not present

## 2018-09-18 DIAGNOSIS — H9191 Unspecified hearing loss, right ear: Secondary | ICD-10-CM | POA: Diagnosis not present

## 2018-09-18 DIAGNOSIS — I5022 Chronic systolic (congestive) heart failure: Secondary | ICD-10-CM | POA: Diagnosis not present

## 2018-09-18 DIAGNOSIS — I429 Cardiomyopathy, unspecified: Secondary | ICD-10-CM | POA: Diagnosis not present

## 2018-09-18 DIAGNOSIS — E876 Hypokalemia: Secondary | ICD-10-CM | POA: Diagnosis not present

## 2018-09-18 DIAGNOSIS — E782 Mixed hyperlipidemia: Secondary | ICD-10-CM | POA: Diagnosis not present

## 2018-09-18 DIAGNOSIS — J45909 Unspecified asthma, uncomplicated: Secondary | ICD-10-CM | POA: Diagnosis not present

## 2018-09-18 DIAGNOSIS — Z79899 Other long term (current) drug therapy: Secondary | ICD-10-CM | POA: Diagnosis not present

## 2018-09-18 DIAGNOSIS — Z7982 Long term (current) use of aspirin: Secondary | ICD-10-CM | POA: Diagnosis not present

## 2018-09-18 DIAGNOSIS — M1712 Unilateral primary osteoarthritis, left knee: Secondary | ICD-10-CM | POA: Diagnosis not present

## 2018-09-20 HISTORY — PX: REPLACEMENT TOTAL KNEE: SUR1224

## 2018-09-21 MED ORDER — ATORVASTATIN CALCIUM 40 MG PO TABS
40.00 | ORAL_TABLET | ORAL | Status: DC
Start: 2018-09-23 — End: 2018-09-21

## 2018-09-21 MED ORDER — GENERIC EXTERNAL MEDICATION
Status: DC
Start: ? — End: 2018-09-21

## 2018-09-21 MED ORDER — LACTATED RINGERS IV SOLN
INTRAVENOUS | Status: DC
Start: ? — End: 2018-09-21

## 2018-09-21 MED ORDER — LACTATED RINGERS IV SOLN
125.00 | INTRAVENOUS | Status: DC
Start: ? — End: 2018-09-21

## 2018-09-21 MED ORDER — POLYETHYLENE GLYCOL 3350 17 G PO PACK
17.00 | PACK | ORAL | Status: DC
Start: 2018-09-22 — End: 2018-09-21

## 2018-09-21 MED ORDER — HYDROMORPHONE HCL 1 MG/ML IJ SOLN
0.50 | INTRAMUSCULAR | Status: DC
Start: ? — End: 2018-09-21

## 2018-09-21 MED ORDER — MONTELUKAST SODIUM 10 MG PO TABS
10.00 | ORAL_TABLET | ORAL | Status: DC
Start: 2018-09-21 — End: 2018-09-21

## 2018-09-21 MED ORDER — MELOXICAM 7.5 MG PO TABS
7.50 | ORAL_TABLET | ORAL | Status: DC
Start: 2018-09-22 — End: 2018-09-21

## 2018-09-21 MED ORDER — BISACODYL 10 MG RE SUPP
10.00 | RECTAL | Status: DC
Start: ? — End: 2018-09-21

## 2018-09-21 MED ORDER — ENOXAPARIN SODIUM 40 MG/0.4ML ~~LOC~~ SOLN
40.00 | SUBCUTANEOUS | Status: DC
Start: 2018-09-22 — End: 2018-09-21

## 2018-09-21 MED ORDER — AMLODIPINE BESYLATE 5 MG PO TABS
5.00 | ORAL_TABLET | ORAL | Status: DC
Start: ? — End: 2018-09-21

## 2018-09-21 MED ORDER — METOPROLOL SUCCINATE ER 50 MG PO TB24
100.00 | ORAL_TABLET | ORAL | Status: DC
Start: ? — End: 2018-09-21

## 2018-09-21 MED ORDER — ACETAMINOPHEN 325 MG PO TABS
975.00 | ORAL_TABLET | ORAL | Status: DC
Start: 2018-09-21 — End: 2018-09-21

## 2018-09-21 MED ORDER — OXYCODONE HCL 5 MG PO TABS
5.00 | ORAL_TABLET | ORAL | Status: DC
Start: ? — End: 2018-09-21

## 2018-09-21 MED ORDER — SENNOSIDES-DOCUSATE SODIUM 8.6-50 MG PO TABS
1.00 | ORAL_TABLET | ORAL | Status: DC
Start: 2018-09-22 — End: 2018-09-21

## 2018-09-21 MED ORDER — ESTRADIOL 1 MG PO TABS
1.00 | ORAL_TABLET | ORAL | Status: DC
Start: 2018-09-22 — End: 2018-09-21

## 2018-09-21 MED ORDER — OLMESARTAN MEDOXOMIL 20 MG PO TABS
20.00 | ORAL_TABLET | ORAL | Status: DC
Start: ? — End: 2018-09-21

## 2018-09-21 MED ORDER — ESCITALOPRAM OXALATE 10 MG PO TABS
10.00 | ORAL_TABLET | ORAL | Status: DC
Start: 2018-09-22 — End: 2018-09-21

## 2018-09-21 MED ORDER — MEDROXYPROGESTERONE ACETATE 2.5 MG PO TABS
2.50 | ORAL_TABLET | ORAL | Status: DC
Start: 2018-09-22 — End: 2018-09-21

## 2018-09-21 MED ORDER — LIDOCAINE HCL 1 % IJ SOLN
0.50 | INTRAMUSCULAR | Status: DC
Start: ? — End: 2018-09-21

## 2018-09-21 MED ORDER — PANTOPRAZOLE SODIUM 40 MG PO TBEC
40.00 | DELAYED_RELEASE_TABLET | ORAL | Status: DC
Start: 2018-09-22 — End: 2018-09-21

## 2018-09-21 MED ORDER — MULTI-VITAMIN PO TABS
1.00 | ORAL_TABLET | ORAL | Status: DC
Start: 2018-09-22 — End: 2018-09-21

## 2018-09-26 ENCOUNTER — Other Ambulatory Visit: Payer: Self-pay | Admitting: Adult Health

## 2018-09-26 ENCOUNTER — Other Ambulatory Visit: Payer: Self-pay | Admitting: Internal Medicine

## 2018-09-26 DIAGNOSIS — J069 Acute upper respiratory infection, unspecified: Secondary | ICD-10-CM

## 2018-10-04 DIAGNOSIS — M25462 Effusion, left knee: Secondary | ICD-10-CM | POA: Diagnosis not present

## 2018-10-04 DIAGNOSIS — Z96652 Presence of left artificial knee joint: Secondary | ICD-10-CM | POA: Diagnosis not present

## 2018-10-05 ENCOUNTER — Other Ambulatory Visit: Payer: Self-pay | Admitting: Internal Medicine

## 2018-10-05 MED ORDER — TRAZODONE HCL 150 MG PO TABS: ORAL_TABLET | ORAL | 0 refills | Status: DC

## 2018-10-06 ENCOUNTER — Telehealth: Payer: Self-pay | Admitting: *Deleted

## 2018-10-06 NOTE — Telephone Encounter (Signed)
Patient called and requested an RX to help her slep.  Dr Melford Aase sent in an RX for Trazodone for sleep.  Patient is aware.

## 2018-10-11 ENCOUNTER — Telehealth: Payer: Self-pay

## 2018-10-11 NOTE — Telephone Encounter (Signed)
Called pt to set up OV with SW 10/12/2018, left message asking pt to call the office.

## 2018-10-13 ENCOUNTER — Other Ambulatory Visit: Payer: Self-pay | Admitting: Internal Medicine

## 2018-10-25 ENCOUNTER — Emergency Department (HOSPITAL_BASED_OUTPATIENT_CLINIC_OR_DEPARTMENT_OTHER)
Admission: EM | Admit: 2018-10-25 | Discharge: 2018-10-25 | Disposition: A | Discharge status: Home / Self Care | Payer: BC Managed Care – PPO | Attending: Emergency Medicine | Admitting: Emergency Medicine

## 2018-10-25 ENCOUNTER — Other Ambulatory Visit: Payer: Self-pay

## 2018-10-25 ENCOUNTER — Encounter (HOSPITAL_BASED_OUTPATIENT_CLINIC_OR_DEPARTMENT_OTHER): Payer: Self-pay | Admitting: Emergency Medicine

## 2018-10-25 DIAGNOSIS — Z7982 Long term (current) use of aspirin: Secondary | ICD-10-CM | POA: Diagnosis not present

## 2018-10-25 DIAGNOSIS — J45909 Unspecified asthma, uncomplicated: Secondary | ICD-10-CM | POA: Insufficient documentation

## 2018-10-25 DIAGNOSIS — Z79899 Other long term (current) drug therapy: Secondary | ICD-10-CM | POA: Diagnosis not present

## 2018-10-25 DIAGNOSIS — Y999 Unspecified external cause status: Secondary | ICD-10-CM | POA: Insufficient documentation

## 2018-10-25 DIAGNOSIS — S61213A Laceration without foreign body of left middle finger without damage to nail, initial encounter: Secondary | ICD-10-CM | POA: Insufficient documentation

## 2018-10-25 DIAGNOSIS — Y929 Unspecified place or not applicable: Secondary | ICD-10-CM | POA: Diagnosis not present

## 2018-10-25 DIAGNOSIS — I110 Hypertensive heart disease with heart failure: Secondary | ICD-10-CM | POA: Diagnosis not present

## 2018-10-25 DIAGNOSIS — I5022 Chronic systolic (congestive) heart failure: Secondary | ICD-10-CM | POA: Diagnosis not present

## 2018-10-25 DIAGNOSIS — Y9389 Activity, other specified: Secondary | ICD-10-CM | POA: Insufficient documentation

## 2018-10-25 DIAGNOSIS — W268XXA Contact with other sharp object(s), not elsewhere classified, initial encounter: Secondary | ICD-10-CM | POA: Insufficient documentation

## 2018-10-25 MED ORDER — LIDOCAINE HCL (PF) 1 % IJ SOLN
10.0000 mL | Freq: Once | INTRAMUSCULAR | Status: AC
  Administered 2018-10-25: 14:00:00 10 mL
  Filled 2018-10-25: qty 10

## 2018-10-25 NOTE — ED Provider Notes (Signed)
[facility] DEPARTMENT Provider Note   CSN: [id number] Arrival date & time: 10/25/18  1207     History   Chief Complaint Chief Complaint  Patient presents with  . Laceration    HPI Gail Bailey is a 58 y.o. female.     Patient with history of cardiomyopathy, multiple orthopedic surgeries --presents to the emergency department with left middle finger laceration sustained approximately 11 AM today.  Patient states that she was taking out some garbage when she sliced her finger on a metal can.  She cleaned the wound out with water and applied pressure with a towel.  She denies any numbness or tingling.  No weakness in the finger.  She went to an outside urgent care and was referred to the emergency department for further evaluation.  No blood thinners.  Last tetanus 2016.     Past Medical History:  Diagnosis Date  . Allergy   . Anxiety   . Asthma   . Cardiomyopathy ([place])    a. Echo 8/16:  EF 40-45%, apical and ant-septal HK, Gr 2 DD  . CHF (congestive heart failure) ([place])   . Diverticula, colon   . Family history of adverse reaction to anesthesia    sisiter had PONV  . Headache    rare migraines  . History of cardiovascular stress test    Myoview 8/16:  EF 48%, anterior and apical defect c/w breast atten, no ischemia; Low Risk // Nuclear stress test 9/18: Low risk stress nuclear study with fixed septal defect consistent with LBBB vs prior infarct; no ischemia; EF 53 with septal akinesis.  . Hyperlipidemia   . Hypertension   . Irregular periods/menstrual cycles   . LBBB (left bundle branch block)   . Melanoma ([place])    right arm  . Seasonal allergies   . Stroke [facility])    TIA august 2016  . TIA (transient ischemic attack)   . Torn meniscus     Patient Active Problem List   Diagnosis Date Noted  . Mild sleep apnea (recommended oral device) 09/14/2018  . Melanoma of left upper arm ([place]) 05/01/2018  . 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 Snoring  05/01/2018  . Chronic fatigue 05/01/2018  . Non-restorative sleep 05/01/2018  . Chronic systolic congestive heart failure, NYHA class 2 ([place]) 05/01/2018  . Cognitive changes 12/30/2015  . Generalized anxiety disorder 12/30/2015  . BMI 25.0-25.9,adult 10/07/2014  . Other abnormal glucose 09/03/2014  . History of TIA (transient ischemic attack) 08/19/2014  . Medication management 06/13/2013  . Asthma   . Hyperlipidemia 01/02/2013  . Vitamin D deficiency 01/02/2013  . HTN 03/07/2012    Past Surgical History:  Procedure Laterality Date  . ABLATION    . BREAST REDUCTION SURGERY    . CARDIAC CATHETERIZATION    . CESAREAN SECTION     x2  . KNEE SURGERY     partial knee replacement right  . MELANOMA EXCISION WITH SENTINEL LYMPH NODE BIOPSY Right 09/15/2017   Procedure: BIOPSY RIGHT SHOULDER MOLE AND WIDE LOCAL EXCISION OF RIGHT ARM MELANOMA, ADVANCEMENT FLAP CLOSURE, SENTINEL LYMPH NODE BIOPSY  RIGHT ARM MELANOMA;  Surgeon: Stark Klein, MD;  Location: [facility];  Service: General;  Laterality: Right;  . REDUCTION MAMMAPLASTY Bilateral   . REPAIR QUADRICEPS / HAMSTRING MUSCLE    . TONSILLECTOMY    . tubes in ears  10/12     OB History   No obstetric history on file.      Home  Medications    Prior to Admission medications   Medication Sig Start Date End Date Taking? Authorizing Provider  Adapalene-Benzoyl Peroxide (EPIDUO) 0.1-2.5 % gel Apply 1 application topically as needed (acne). 08/13/17   Unk Pinto, MD  albuterol (VENTOLIN HFA) 108 (90 Base) MCG/ACT inhaler Inhale 2 puffs into the lungs every 4 (four) hours as needed for wheezing or shortness of breath. 09/15/18   Liane Comber, NP  amLODipine (NORVASC) 5 MG tablet Take 1 tablet (5 mg total) by mouth daily. 01/11/18 01/11/19  Garnet Sierras, NP  aspirin EC 81 MG tablet Take 1 tablet (81 mg total) by mouth daily. 08/22/14   Richardson Dopp T, PA-C  atorvastatin (LIPITOR) 20 MG tablet Take 1 tablet (20 mg  total) by mouth 3 (three) times a week. 09/16/18 09/16/19  Liane Comber, NP  busPIRone (BUSPAR) 15 MG tablet TAKE 1/2 TO 1 TABLET BY MOUTH TWICE DAILY FOR ANXIETY 09/26/18   Unk Pinto, MD  cetirizine (ZYRTEC) 10 MG tablet Take 10 mg by mouth daily.    [provider]  Cholecalciferol (VITAMIN D PO) Take 5,000 Units by mouth daily.     [provider]  escitalopram (LEXAPRO) 10 MG tablet TAKE 1 TABLET BY MOUTH EVERY DAY 05/04/18   Vicie Mutters, PA-C  estradiol (ESTRACE) 0.5 MG tablet Take 0.5 mg by mouth daily.    [provider]  fluticasone (FLONASE) 50 MCG/ACT nasal spray SPRAY 2 SPRAYS INTO EACH NOSTRIL EVERY DAY 09/26/18   Unk Pinto, MD  furosemide (LASIX) 40 MG tablet TAKE 1 TABLET BY MOUTH EVERY DAY FOR BLOOD PRESSURE AND FLUID 07/29/18   Liane Comber, NP  ibuprofen (ADVIL) 800 MG tablet Take 1 tablet 3 x /day with Food if needed for Cramping 08/20/18   Unk Pinto, MD  medroxyPROGESTERone (PROVERA) 2.5 MG tablet Take 2.5 mg by mouth daily.    [provider]  metoprolol succinate (TOPROL-XL) 100 MG 24 hr tablet Take 1/2 to 1 tablet Daily for BP 09/10/18   Unk Pinto, MD  montelukast (SINGULAIR) 10 MG tablet TAKE 1 TABLET BY MOUTH EVERY DAY 09/05/17   Liane Comber, NP  olmesartan (BENICAR) 40 MG tablet TAKE 1/2 TABLET BY MOUTH EVERY DAY IF BLOOD PRESSURE 140/90, TAKE 1 FULL TALBET 07/15/18   Liane Comber, NP  Omega-3 Fatty Acids (FISH OIL PO) Take 1 capsule by mouth daily.    [provider]  traZODone (DESYREL) 150 MG tablet TAKE 1/2 TO 1 TABLET 1 TO 2 HOURS BEFORE BEDTIME AS NEEDED FOR SLEEP 10/13/18   Unk Pinto, MD  valACYclovir (VALTREX) 500 MG tablet Take 500 mg by mouth 2 (two) times daily as needed (fever blister outbreak).    [provider]    Family History Family History  Problem Relation Age of Onset  . Diabetes Mother   . Heart disease Mother   . Hypertension Mother   . Heart attack Mother  22  . Other Father        MVA  . Asthma Sister   . Asthma Son   . Cancer Maternal Grandmother        Uterine  . Breast cancer Maternal Grandmother   . Heart attack Paternal Grandmother   . Colon cancer Neg Hx   . Colon polyps Neg Hx   . Esophageal cancer Neg Hx   . Stomach cancer Neg Hx   . Rectal cancer Neg Hx     Social History Social History   Tobacco Use  . Smoking status:  Never Smoker  . Smokeless tobacco: Never Used  Substance Use Topics  . Alcohol use: Yes    Comment: 1-2 glasses wine nightly  . Drug use: No     Allergies   Patient has no known allergies.   Review of Systems Review of Systems  Musculoskeletal: Positive for myalgias. Negative for arthralgias.  Skin: Positive for wound.  Neurological: Negative for weakness and numbness.     Physical Exam Updated Vital Signs BP 114/68 (BP Location: Right Arm)   Pulse 68   Temp 98.6 F (37 C) (Oral)   Resp 16   Ht 5' (1.524 m)   Wt 64.9 kg   SpO2 99%   BMI 27.93 kg/m   Physical Exam Vitals signs and nursing note reviewed.  Constitutional:      Appearance: She is well-developed.  HENT:     Head: Normocephalic and atraumatic.  Eyes:     Conjunctiva/sclera: Conjunctivae normal.  Neck:     Musculoskeletal: Normal range of motion and neck supple.  Pulmonary:     Effort: No respiratory distress.  Skin:    General: Skin is warm and dry.     Comments: 1cm minimally gaping laceration to the volar aspect of the left middle finger.  Wound base fully explored after finger tourniquet applied.  No visible flexor tendon involvement.  Wound extends into the subcutaneous tissue.  No foreign bodies.  Neurological:     Mental Status: She is alert.      ED Treatments / Results  Labs (all labs ordered are listed, but only abnormal results are displayed) Labs Reviewed - No data to display  EKG None  Radiology No results found.  Procedures .Marland KitchenLaceration Repair  Date/Time: 10/25/2018 3:27 PM  Performed by: Carlisle Cater, PA-C Authorized by: Carlisle Cater, PA-C   Consent:    Consent obtained:  Verbal   Consent given by:  Patient   Risks discussed:  Infection, pain, retained foreign body, tendon damage and nerve damage (Discussed risk of nerve damage from her laceration)   Alternatives discussed:  No treatment Anesthesia (see MAR for exact dosages):    Anesthesia method:  Nerve block   Block location:  L long finger   Block needle gauge:  27 G   Block anesthetic:  Lidocaine 1% w/o epi   Block technique:  Single injection into flexor tendon sheath   Block injection procedure:  Anatomic landmarks identified, introduced needle, incremental injection, negative aspiration for blood and anatomic landmarks palpated   Block outcome:  Anesthesia achieved Laceration details:    Location:  Finger   Finger location:  L long finger   Length (cm):  1 Repair type:    Repair type:  Simple Pre-procedure details:    Preparation:  Patient was prepped and draped in usual sterile fashion Exploration:    Hemostasis achieved with:  Direct pressure and tourniquet   Wound exploration: wound explored through full range of motion and entire depth of wound probed and visualized     Wound extent: no tendon damage noted     Contaminated: no   Treatment:    Wound cleansed with: Sterile water.   Amount of cleaning:  Extensive   Irrigation solution:  Sterile saline   Irrigation volume:  1000cc Skin repair:    Repair method:  Sutures   Suture size:  5-0   Suture material:  Nylon   Suture technique:  Simple interrupted   Number of sutures:  4 Approximation:    Approximation:  Close  Post-procedure details:    Dressing:  Open (no dressing)   Patient tolerance of procedure:  Tolerated well, no immediate complications   (including critical care time)  Medications Ordered in ED Medications  lidocaine (PF) (XYLOCAINE) 1 % injection 10 mL (10 mLs Infiltration Given 10/25/18 1412)     Initial  Impression / Assessment and Plan / ED Course  I have reviewed the triage vital signs and the nursing notes.  Pertinent labs & imaging results that were available during my care of the patient were reviewed by me and considered in my medical decision making (see chart for details).        Patient seen and examined.  Discussed digital block and wound repair with patient.  She agrees to proceed.  Tetanus up-to-date.  Vital signs reviewed and are as follows: BP 114/68 (BP Location: Right Arm)   Pulse 68   Temp 98.6 F (37 C) (Oral)   Resp 16   Ht 5' (1.524 m)   Wt 64.9 kg   SpO2 99%   BMI 27.93 kg/m   3:29 PM finger splint placed prior to discharge.  Patient counseled on wound care. Patient counseled on need to return or see PCP/urgent care for suture removal in 10 days. Patient was urged to return to the Emergency Department urgently with worsening pain, swelling, expanding erythema especially if it streaks away from the affected area, fever, or if they have any other concerns. Patient verbalized understanding.    Final Clinical Impressions(s) / ED Diagnoses   Final diagnoses:  Laceration of left middle finger without foreign body without damage to nail, initial encounter   Patient with left middle finger laceration, not involving the flexor tendon.  Patient has preserved strength of the digit.  Full exploration of the wound does not demonstrate any tendon involvement.  Wound repaired without complication.  Splint for the next 2 or 3 days to protect the area and then wound recheck and suture removal in 10 days.  Counseled on signs and symptoms of infection as above.  ED Discharge Orders    None       Carlisle Cater, Hershal Coria 10/25/18 1530    Blanchie Dessert, MD 10/25/18 1601

## 2018-10-25 NOTE — ED Notes (Signed)
PO meal and snack given.

## 2018-10-25 NOTE — ED Triage Notes (Signed)
Pt reports laceration to left middle finger 1 hr pta. Bandage applied pta, bleeding controlled.

## 2018-10-25 NOTE — Discharge Instructions (Signed)
Please read and follow all provided instructions.  Your diagnoses today include:  1. Laceration of right middle finger, foreign body presence unspecified, nail damage status unspecified, initial encounter     Tests performed today include:  Vital signs. See below for your results today.   Medications prescribed:   None  Take any prescribed medications only as directed.   Home care instructions:  Follow any educational materials and wound care instructions contained in this packet.   Keep affected area above the level of your heart when possible to minimize swelling. Wash area gently twice a day with warm soapy water. Do not apply alcohol or hydrogen peroxide. Cover the area if it draining or weeping.   Follow-up instructions: Suture Removal: Return to the Emergency Department or see your primary care care doctor in 10-14 days for a recheck of your wound and removal of your sutures or staples.    Return instructions:  Return to the Emergency Department if you have:  Fever  Worsening pain  Worsening swelling of the wound  Pus draining from the wound  Redness of the skin that moves away from the wound, especially if it streaks away from the affected area   Any other emergent concerns  Your vital signs today were: BP 114/68 (BP Location: Right Arm)    Pulse 68    Temp 98.6 F (37 C) (Oral)    Resp 16    Ht 5' (1.524 m)    Wt 64.9 kg    SpO2 99%    BMI 27.93 kg/m  If your blood pressure (BP) was elevated above 135/85 this visit, please have this repeated by your doctor within one month. --------------

## 2018-10-25 NOTE — ED Notes (Signed)
Pts bandage removed, Gauze applied to control bleeding

## 2018-10-26 ENCOUNTER — Other Ambulatory Visit: Payer: Self-pay | Admitting: Physician Assistant

## 2018-10-26 DIAGNOSIS — F411 Generalized anxiety disorder: Secondary | ICD-10-CM

## 2018-11-03 NOTE — Progress Notes (Signed)
Subjective:    Gail Bailey is a 58 y.o. female who obtained a laceration 10 days ago to left middle finger, which required closure with 4 sutures in ED. Mechanism of injury: sliced finger on metal can. She denies pain, redness, or drainage from the wound. Her last tetanus was 4 years ago.  The following portions of the patient's history were reviewed and updated as appropriate: allergies, current medications, past social history and problem list.  Review of Systems Pertinent items noted in HPI and remainder of comprehensive ROS otherwise negative.    Objective:    There were no vitals taken for this visit. Injury exam:  A 1.5 cm laceration noted on the left hand 3rd digit distal dorsum is healing well, without evidence of infection.    Assessment:   BP 128/88   Pulse 72   Temp (!) 97.2 F (36.2 C)   Wt 137 lb (62.1 kg)   SpO2 97%   BMI 26.76 kg/m   General : Well dressed/nourashed female patient in no apparent distress HEENT: conjunctiva without redness or discharge; no hoarseness, no cough for duration of visit.  Lungs: speaks in complete sentences, no audible wheezing, no apparent distress Neurological: alert, oriented x 3 Psychiatric: pleasant, judgement appropriate  Skin:   Laceration - 1.5 cm to left 3rd digit distal dorsum is healing well, without evidence of infection.  4 sutures present.    Plan:     1. 4 sutures were removed. 2. Wound care discussed. 3. Follow up as needed.    Future Appointments  Date Time Provider [place]  12/26/2018 11:00 AM Unk Pinto, MD [facility] None

## 2018-11-04 ENCOUNTER — Encounter: Payer: Self-pay | Admitting: Adult Health

## 2018-11-04 ENCOUNTER — Other Ambulatory Visit: Payer: Self-pay

## 2018-11-04 ENCOUNTER — Ambulatory Visit: Payer: BC Managed Care – PPO | Admitting: Adult Health

## 2018-11-04 VITALS — BP 128/88 | HR 72 | Temp 97.2°F | Wt 137.0 lb

## 2018-11-04 DIAGNOSIS — Z4802 Encounter for removal of sutures: Secondary | ICD-10-CM | POA: Diagnosis not present

## 2018-11-04 DIAGNOSIS — S61213D Laceration without foreign body of left middle finger without damage to nail, subsequent encounter: Secondary | ICD-10-CM

## 2018-11-09 ENCOUNTER — Other Ambulatory Visit: Payer: Self-pay | Admitting: Adult Health

## 2018-11-21 ENCOUNTER — Other Ambulatory Visit: Payer: Self-pay | Admitting: Internal Medicine

## 2018-11-24 ENCOUNTER — Encounter: Payer: Self-pay | Admitting: Internal Medicine

## 2018-12-06 DIAGNOSIS — M9901 Segmental and somatic dysfunction of cervical region: Secondary | ICD-10-CM | POA: Diagnosis not present

## 2018-12-13 DIAGNOSIS — N959 Unspecified menopausal and perimenopausal disorder: Secondary | ICD-10-CM | POA: Diagnosis not present

## 2018-12-13 DIAGNOSIS — Z6825 Body mass index (BMI) 25.0-25.9, adult: Secondary | ICD-10-CM | POA: Diagnosis not present

## 2018-12-13 DIAGNOSIS — Z01419 Encounter for gynecological examination (general) (routine) without abnormal findings: Secondary | ICD-10-CM | POA: Diagnosis not present

## 2018-12-21 ENCOUNTER — Telehealth: Payer: Self-pay | Admitting: *Deleted

## 2018-12-21 NOTE — Telephone Encounter (Signed)
Pt contacted off recall list.. She has agreed for a virtual appt with Richardson Dopp, PA-C 12/11 atr 11:15.     Virtual Visit Pre-Appointment Phone Call  "(Name), I am calling you today to discuss your upcoming appointment. We are currently trying to limit exposure to the virus that causes COVID-19 by seeing patients at home rather than in the office."  1. "What is the BEST phone number to call the day of the visit?" - include this in appointment notes  2. "Do you have or have access to (through a family member/friend) a smartphone with video capability that we can use for your visit?" a. If yes - list this number in appt notes as "cell" (if different from BEST phone #) and list the appointment type as a VIDEO visit in appointment notes b. If no - list the appointment type as a PHONE visit in appointment notes  3. Confirm consent - "In the setting of the current Covid19 crisis, you are scheduled for a (phone or video) visit with your provider on (date) at (time).  Just as we do with many in-office visits, in order for you to participate in this visit, we must obtain consent.  If you'd like, I can send this to your mychart (if signed up) or email for you to review.  Otherwise, I can obtain your verbal consent now.  All virtual visits are billed to your insurance company just like a normal visit would be.  By agreeing to a virtual visit, we'd like you to understand that the technology does not allow for your provider to perform an examination, and thus may limit your provider's ability to fully assess your condition. If your provider identifies any concerns that need to be evaluated in person, we will make arrangements to do so.  Finally, though the technology is pretty good, we cannot assure that it will always work on either your or our end, and in the setting of a video visit, we may have to convert it to a phone-only visit.  In either situation, we cannot ensure that we have a secure connection.  Are  you willing to proceed?" STAFF: Did the patient verbally acknowledge consent to telehealth visit? Document YES/NO here: YES, VERBAL OK   4. Advise patient to be prepared - "Two hours prior to your appointment, go ahead and check your blood pressure, pulse, oxygen saturation, and your weight (if you have the equipment to check those) and write them all down. When your visit starts, your provider will ask you for this information. If you have an Apple Watch or Kardia device, please plan to have heart rate information ready on the day of your appointment. Please have a pen and paper handy nearby the day of the visit as well."  5. Give patient instructions for MyChart download to smartphone OR [email or web address] as below if video visit (depending on what platform provider is using)  6. Inform patient they will receive a phone call 15 minutes prior to their appointment time (may be from unknown caller ID) so they should be prepared to answer    TELEPHONE CALL NOTE  Carroll Isabella has been deemed a candidate for a follow-up tele-health visit to limit community exposure during the Covid-19 pandemic. I spoke with the patient via phone to ensure availability of phone/video source, confirm preferred email & phone number, and discuss instructions and expectations.  I reminded Michaela Boulos to be prepared with any vital sign and/or heart rhythm information that could potentially be  obtained via home monitoring, at the time of her visit. I reminded Tokie Zemp to expect a phone call prior to her visit.  Jeanann Lewandowsky, [place] 12/21/2018 4:07 PM   INSTRUCTIONS FOR DOWNLOADING THE MYCHART APP TO SMARTPHONE  - The patient must first make sure to have activated MyChart and know their login information - If Apple, go to [employer] and type in MyChart in the search bar and download the app. If Android, ask patient to go to [employer] and type in [employer] in the search bar and download the app. The app is free but  as with any other app downloads, their phone may require them to verify saved payment information or Apple/Android password.  - The patient will need to then log into the app with their MyChart username and password, and select [facility] as their healthcare provider to link the account. When it is time for your visit, go to the MyChart app, find appointments, and click Begin Video Visit. Be sure to Select Allow for your device to access the Microphone and Camera for your visit. You will then be connected, and your provider will be with you shortly.  **If they have any issues connecting, or need assistance please contact MyChart service desk [phone])**  **If using a computer, in order to ensure the best quality for their visit they will need to use either of the following Internet Browsers: [employer], or Google Chrome**  IF USING DOXIMITY or DOXY.ME - The patient will receive a link just prior to their visit by text.     FULL LENGTH CONSENT FOR TELE-HEALTH VISIT   I hereby voluntarily request, consent and authorize [facility] and its employed or contracted physicians, physician assistants, nurse practitioners or other licensed health care professionals (the Practitioner), to provide me with telemedicine health care services (the "Services") as deemed necessary by the treating Practitioner. I acknowledge and consent to receive the Services by the Practitioner via telemedicine. I understand that the telemedicine visit will involve communicating with the Practitioner through live audiovisual communication technology and the disclosure of certain medical information by electronic transmission. I acknowledge that I have been given the opportunity to request an in-person assessment or other available alternative prior to the telemedicine visit and am voluntarily participating in the telemedicine visit.  I understand that I have the right to withhold or withdraw my consent to  the use of telemedicine in the course of my care at any time, without affecting my right to future care or treatment, and that the Practitioner or I may terminate the telemedicine visit at any time. I understand that I have the right to inspect all information obtained and/or recorded in the course of the telemedicine visit and may receive copies of available information for a reasonable fee.  I understand that some of the potential risks of receiving the Services via telemedicine include:  Marland Kitchen Delay or interruption in medical evaluation due to technological equipment failure or disruption; . Information transmitted may not be sufficient (e.g. poor resolution of images) to allow for appropriate medical decision making by the Practitioner; and/or  . In rare instances, security protocols could fail, causing a breach of personal health information.  Furthermore, I acknowledge that it is my responsibility to provide information about my medical history, conditions and care that is complete and accurate to the best of my ability. I acknowledge that Practitioner's advice, recommendations, and/or decision may be based on factors not within their control, such as  incomplete or inaccurate data provided by me or distortions of diagnostic images or specimens that may result from electronic transmissions. I understand that the practice of medicine is not an exact science and that Practitioner makes no warranties or guarantees regarding treatment outcomes. I acknowledge that I will receive a copy of this consent concurrently upon execution via email to the email address I last provided but may also request a printed copy by calling the office of [facility].    I understand that my insurance will be billed for this visit.   I have read or had this consent read to me. . I understand the contents of this consent, which adequately explains the benefits and risks of the Services being provided via telemedicine.  . I have  been provided ample opportunity to ask questions regarding this consent and the Services and have had my questions answered to my satisfaction. . I give my informed consent for the services to be provided through the use of telemedicine in my medical care  By participating in this telemedicine visit I agree to the above.

## 2018-12-23 ENCOUNTER — Encounter: Payer: Self-pay | Admitting: Physician Assistant

## 2018-12-23 ENCOUNTER — Telehealth (INDEPENDENT_AMBULATORY_CARE_PROVIDER_SITE_OTHER): Payer: BC Managed Care – PPO | Admitting: Physician Assistant

## 2018-12-23 ENCOUNTER — Other Ambulatory Visit: Payer: Self-pay

## 2018-12-23 VITALS — BP 123/66 | HR 55 | Ht 60.0 in | Wt 135.0 lb

## 2018-12-23 DIAGNOSIS — I10 Essential (primary) hypertension: Secondary | ICD-10-CM

## 2018-12-23 DIAGNOSIS — I519 Heart disease, unspecified: Secondary | ICD-10-CM

## 2018-12-23 DIAGNOSIS — E782 Mixed hyperlipidemia: Secondary | ICD-10-CM

## 2018-12-23 NOTE — Patient Instructions (Signed)
Medication Instructions:   Your physician recommends that you continue on your current medications as directed. Please refer to the Current Medication list given to you today.  *If you need a refill on your cardiac medications before your next appointment, please call your pharmacy*  Lab Work:  None ordered today  Testing/Procedures:  None ordered today  Follow-Up: At [facility], you and your health needs are our priority.  As part of our continuing mission to provide you with exceptional heart care, we have created designated Provider Care Teams.  These Care Teams include your primary Cardiologist (physician) and Advanced Practice Providers (APPs -  Physician Assistants and Nurse Practitioners) who all work together to provide you with the care you need, when you need it.  Your next appointment:   12 months  The format for your next appointment:   In Person  Provider:   You may see Mertie Moores, MD or one of the following Advanced Practice Providers on your designated Care Team:    Richardson Dopp, PA-C  [place], [place]  Daune Perch, NP   Other Instructions   High Triglycerides Eating Plan Triglycerides are a type of fat in the blood. High levels of triglycerides can increase your risk of heart disease and stroke. If your triglyceride levels are high, choosing the right foods can help lower your triglycerides and keep your heart healthy. Work with your health care provider or a diet and nutrition specialist (dietitian) to develop an eating plan that is right for you. What are tips for following this plan? General guidelines   Lose weight, if you are overweight. For most people, losing 5-10 lbs (2-5 kg) helps lower triglyceride levels. A weight-loss plan may include. ? 30 minutes of exercise at least 5 days a week. ? Reducing the amount of calories, sugar, and fat you eat.  Eat a wide variety of fresh fruits, vegetables, and whole grains. These foods are high in  fiber.  Eat foods that contain healthy fats, such as fatty fish, nuts, seeds, and olive oil.  Avoid foods that are high in added sugar, added salt (sodium), saturated fat, and trans fat.  Avoid low-fiber, refined carbohydrates such as white bread, crackers, noodles, and white rice.  Avoid foods with partially hydrogenated oils (trans fats), such as fried foods or stick margarine.  Limit alcohol intake to no more than 1 drink a day for nonpregnant women and 2 drinks a day for men. One drink equals 12 oz of beer, 5 oz of wine, or 1 oz of hard liquor. Your health care provider may recommend that you drink less depending on your overall health. Reading food labels  Check food labels for the amount of saturated fat. Choose foods with no or very little saturated fat.  Check food labels for the amount of trans fat. Choose foods with no trans fat.  Check food labels for the amount of cholesterol. Choose foods low in cholesterol. Ask your dietitian how much cholesterol you should have each day.  Check food labels for the amount of sodium. Choose foods with less than 140 milligrams (mg) per serving. Shopping  Buy dairy products labeled as nonfat (skim) or low-fat (1%).  Avoid buying processed or prepackaged foods. These are often high in added sugar, sodium, and fat. Cooking  Choose healthy fats when cooking, such as olive oil or canola oil.  Cook foods using lower fat methods, such as baking, broiling, boiling, or grilling.  Make your own sauces, dressings, and marinades when  possible, instead of buying them. Store-bought sauces, dressings, and marinades are often high in sodium and sugar. Meal planning  Eat more home-cooked food and less restaurant, buffet, and fast food.  Eat fatty fish at least 2 times each week. Examples of fatty fish include salmon, trout, mackerel, tuna, and herring.  If you eat whole eggs, do not eat more than 3 egg yolks per week. What foods are  recommended? The items listed may not be a complete list. Talk with your dietitian about what dietary choices are best for you. Grains Whole wheat or whole grain breads, crackers, cereals, and pasta. Unsweetened oatmeal. Bulgur. Barley. Quinoa. Brown rice. Whole wheat flour tortillas. Vegetables Fresh or frozen vegetables. Low-sodium canned vegetables. Fruits All fresh, canned (in natural juice), or frozen fruits. Meats and other protein foods Skinless chicken or [place]. Ground chicken or [place]. Lean cuts of pork, trimmed of fat. Fish and seafood, especially salmon, trout, and herring. Egg whites. Dried beans, peas, or lentils. Unsalted nuts or seeds. Unsalted canned beans. Natural peanut or almond butter. Dairy Low-fat dairy products. Skim or low-fat (1%) milk. Reduced fat (2%) and low-sodium cheese. Low-fat ricotta cheese. Low-fat cottage cheese. Plain, low-fat yogurt. Fats and oils Tub margarine without trans fats. Light or reduced-fat mayonnaise. Light or reduced-fat salad dressings. Avocado. Safflower, olive, sunflower, soybean, and canola oils. What foods are not recommended? The items listed may not be a complete list. Talk with your dietitian about what dietary choices are best for you. Grains White bread. White (regular) pasta. White rice. Cornbread. Bagels. Pastries. Crackers that contain trans fat. Vegetables Creamed or fried vegetables. Vegetables in a cheese sauce. Fruits Sweetened dried fruit. Canned fruit in syrup. Fruit juice. Meats and other protein foods Fatty cuts of meat. Ribs. Chicken wings. Berniece Salines. Sausage. Bologna. Salami. Chitterlings. Fatback. Hot dogs. Bratwurst. Packaged lunch meats. Dairy Whole or reduced-fat (2%) milk. Half-and-half. Cream cheese. Full-fat or sweetened yogurt. Full-fat cheese. Nondairy creamers. Whipped toppings. Processed cheese or cheese spreads. Cheese curds. Beverages Alcohol. Sweetened drinks, such as soda, lemonade, fruit drinks, or  punches. Fats and oils Butter. Stick margarine. Lard. Shortening. Ghee. Bacon fat. Tropical oils, such as coconut, palm kernel, or palm oils. Sweets and desserts Corn syrup. Sugars. Honey. Molasses. Candy. Jam and jelly. Syrup. Sweetened cereals. Cookies. Pies. Cakes. Donuts. Muffins. Ice cream. Condiments Store-bought sauces, dressings, and marinades that are high in sugar, such as ketchup and barbecue sauce. Summary  High levels of triglycerides can increase the risk of heart disease and stroke. Choosing the right foods can help lower your triglycerides.  Eat plenty of fresh fruits, vegetables, and whole grains. Choose low-fat dairy and lean meats. Eat fatty fish at least twice a week.  Avoid processed and prepackaged foods with added sugar, sodium, saturated fat, and trans fat.  If you need suggestions or have questions about what types of food are good for you, talk with your health care provider or a dietitian. This information is not intended to replace advice given to you by your health care provider. Make sure you discuss any questions you have with your health care provider. Document Released: 10/17/2003 Document Revised: 12/11/2016 Document Reviewed: 03/03/2016 Elsevier Patient Education  2020 [employer].

## 2018-12-23 NOTE — Progress Notes (Signed)
Virtual Visit via Telephone Note   This visit type was conducted due to national recommendations for restrictions regarding the COVID-19 Pandemic (e.g. social distancing) in an effort to limit this patient's exposure and mitigate transmission in our community.  Due to her co-morbid illnesses, this patient is at least at moderate risk for complications without adequate follow up.  This format is felt to be most appropriate for this patient at this time.  The patient did not have access to video technology/had technical difficulties with video requiring transitioning to audio format only (telephone).  All issues noted in this document were discussed and addressed.  No physical exam could be performed with this format.  Please refer to the patient's chart for her  consent to telehealth for [facility].   Date:  12/23/2018   ID:  Gail Bailey, DOB 12-29-1960, MRN [id number]  Patient Location: Home Provider Location: Home  PCP:  Unk Pinto, MD  Cardiologist:  Mertie Moores, MD   Electrophysiologist:  None   Evaluation Performed:  Follow-Up Visit  Chief Complaint:  Hypertension, LV dysfunction, LBBB  History of Present Illness:    Gail Bailey is a 58 y.o. female with   Status post TIA [id number]  Hx of Systolic dysfunction  Echo 05/2018: EF 50  Cardiac MRI 2/17: EF 59  Echo 8/16: 40-45  Hypertension  LBBB  Hyperlipidemia  Chest pain  Remote cardiac catheterization without CAD  Myoview 8/16: No ischemia  Mild sleep apnea home study 06/2018; CPAP not recommended  The patient has a history of prior TIA.  She has also had mild systolic dysfunction over the years with a negative cardiac catheterization many years ago.  MRI in 2017 demonstrated EF 59%.  Most recent echocardiogram in May 2020 demonstrated EF 50%.  She was last seen by Dr. Acie Fredrickson in May 2020 via telemedicine.  This was prior to an upcoming knee replacement surgery.   The patient does not have symptoms  concerning for COVID-19 infection (fever, chills, cough, or new shortness of breath).    Past Medical History:  Diagnosis Date   Allergy    Anxiety    Asthma    Cardiomyopathy ([place])    a. Echo 8/16:  EF 40-45%, apical and ant-septal HK, Gr 2 DD   CHF (congestive heart failure) (HCC)    Diverticula, colon    Family history of adverse reaction to anesthesia    sisiter had PONV   Headache    rare migraines   History of cardiovascular stress test    Myoview 8/16:  EF 48%, anterior and apical defect c/w breast atten, no ischemia; Low Risk // Nuclear stress test 9/18: Low risk stress nuclear study with fixed septal defect consistent with LBBB vs prior infarct; no ischemia; EF 53 with septal akinesis.   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   Seasonal allergies    Stroke [facility])    TIA august 2016   TIA (transient ischemic attack)    Torn meniscus    Past Surgical History:  Procedure Laterality Date   ABLATION     BREAST REDUCTION SURGERY     CARDIAC CATHETERIZATION     CESAREAN SECTION     x2   KNEE SURGERY     partial knee replacement right   MELANOMA EXCISION WITH SENTINEL LYMPH NODE BIOPSY Right 09/15/2017   Procedure: BIOPSY RIGHT  SHOULDER MOLE AND WIDE LOCAL EXCISION OF RIGHT ARM MELANOMA, ADVANCEMENT FLAP CLOSURE, SENTINEL LYMPH NODE BIOPSY  RIGHT ARM MELANOMA;  Surgeon: Stark Klein, MD;  Location: [facility];  Service: General;  Laterality: Right;   REDUCTION MAMMAPLASTY Bilateral    REPAIR QUADRICEPS / HAMSTRING MUSCLE     TONSILLECTOMY     tubes in ears  10/12     Current Meds  Medication Sig   Adapalene-Benzoyl Peroxide (EPIDUO) 0.1-2.5 % gel Apply 1 application topically as needed (acne).   albuterol (VENTOLIN HFA) 108 (90 Base) MCG/ACT inhaler Inhale 2 puffs into the lungs every 4 (four) hours as needed for wheezing or  shortness of breath.   amLODipine (NORVASC) 5 MG tablet Take 1 tablet (5 mg total) by mouth daily.   aspirin EC 81 MG tablet Take 1 tablet (81 mg total) by mouth daily.   atorvastatin (LIPITOR) 20 MG tablet Take 1 tablet (20 mg total) by mouth 3 (three) times a week.   busPIRone (BUSPAR) 15 MG tablet TAKE 1/2 TO 1 TABLET BY MOUTH TWICE DAILY FOR ANXIETY   Cholecalciferol (VITAMIN D PO) Take 5,000 Units by mouth daily.    escitalopram (LEXAPRO) 10 MG tablet TAKE 1 TABLET BY MOUTH EVERY DAY   estradiol (ESTRACE) 0.5 MG tablet Take 0.5 mg by mouth daily.   fluticasone (FLONASE) 50 MCG/ACT nasal spray SPRAY 2 SPRAYS INTO EACH NOSTRIL EVERY DAY   furosemide (LASIX) 40 MG tablet TAKE 1 TABLET BY MOUTH EVERY DAY FOR BLOOD PRESSURE AND FLUID   ibuprofen (ADVIL) 800 MG tablet Take 1 tablet 3 x /day with Food if needed for Cramping   medroxyPROGESTERone (PROVERA) 2.5 MG tablet Take 2.5 mg by mouth daily.   metoprolol succinate (TOPROL-XL) 100 MG 24 hr tablet Take 1/2 to 1 tablet Daily for BP   montelukast (SINGULAIR) 10 MG tablet Take 1 tablet daily for allergies.   olmesartan (BENICAR) 40 MG tablet TAKE 1/2 TABLET BY MOUTH EVERY DAY IF BLOOD PRESSURE 140/90, TAKE 1 FULL TALBET   Omega-3 Fatty Acids (FISH OIL PO) Take 1 capsule by mouth daily.   valACYclovir (VALTREX) 500 MG tablet Take 500 mg by mouth 2 (two) times daily as needed (fever blister outbreak).   [DISCONTINUED] atorvastatin (LIPITOR) 40 MG tablet Take 1 tablet Daily for Cholesterol     Allergies:   Oxycodone   Social History   Tobacco Use   Smoking status: Never Smoker   Smokeless tobacco: Never Used  Substance Use Topics   Alcohol use: Yes    Comment: 1-2 glasses wine nightly   Drug use: No     Family Hx: The patient's family history includes Asthma in her sister and son; Breast cancer in her maternal grandmother; Cancer in her maternal grandmother; Diabetes in her mother; Heart attack in her paternal  grandmother; Heart attack (age of onset: 74) in her mother; Heart disease in her mother; Hypertension in her mother; Other in her father. There is no history of Colon cancer, Colon polyps, Esophageal cancer, Stomach cancer, or Rectal cancer.  ROS:   Please see the history of present illness.     All other systems reviewed and are negative.   Prior CV studies:   The following studies were reviewed today:  Echocardiogram 05/25/2018 EF 50, grade 1 diastolic dysfunction normal RV SF  Myoview 09/16/2016 Low risk stress nuclear study with fixed septal defect consistent with LBBB vs prior infarct; no ischemia; EF 53 with septal akinesis.  Cardiac MRI 03/04/15  IMPRESSION: 1) Normal LV size and function with no RWMAls EF 59% 2) No infarct on delayed inversion recovery sequences using gadolinium 3) Mild LAE  Nuclear stress test 09/04/14 Low risk stress nuclear study with a medium size, severe intensity, fixed distal anterior and apical defect consistent with breast attenuation; no ischemia; mild global reduction in LV function (EF 48).  Echo 08/19/14 EF 40-45%, apical and anteroseptal severe HK, Gr 2 DD.  Labs/Other Tests and Data Reviewed:    EKG:  No ECG reviewed.  Recent Labs: 09/15/2018: ALT 25; BUN 11; Creat 0.76; Hemoglobin 14.5; Magnesium 2.0; Platelets 308; Potassium 3.9; Sodium 137; TSH 1.27   Recent Lipid Panel Lab Results  Component Value Date/Time   CHOL 166 09/15/2018 12:00 AM   TRIG 412 (H) 09/15/2018 12:00 AM   HDL 31 (L) 09/15/2018 12:00 AM   CHOLHDL 5.4 (H) 09/15/2018 12:00 AM   LDLCALC  09/15/2018 12:00 AM     Comment:     . LDL cholesterol not calculated. Triglyceride levels greater than 400 mg/dL invalidate calculated LDL results. . Reference range: <100 . Desirable range <100 mg/dL for primary prevention;   <70 mg/dL for patients with CHD or diabetic patients  with > or = 2 CHD risk factors. Marland Kitchen LDL-C is now calculated using the Martin-Hopkins  calculation,  which is a validated novel method providing  better accuracy than the Friedewald equation in the  estimation of LDL-C.  Cresenciano Genre et al. Annamaria Helling. [id number]): 2061-2068  (http://education.[facility].com/faq/FAQ164)     Wt Readings from Last 3 Encounters:  12/23/18 135 lb (61.2 kg)  11/04/18 137 lb (62.1 kg)  10/25/18 143 lb (64.9 kg)     Objective:    Vital Signs:  BP 123/66    Pulse (!) 55    Ht 5' (1.524 m)    Wt 135 lb (61.2 kg)    BMI 26.37 kg/m    VITAL SIGNS:  reviewed GEN:  no acute distress RESPIRATORY:  No labored breathing NEURO:  Alert and oriented PSYCH:  Normal mood  ASSESSMENT & PLAN:    1. Essential hypertension Blood pressure is well controlled.  She has been getting along well since her knee surgery without concerning symptoms.  Continue current medications.  2. Hyperlipidemia She remains on atorvastatin.  Triglycerides were elevated in September 2020 at 412.  We discussed the importance of lowering triglycerides through diet.  3.  LV dysfunction She has had mild LV dysfunction over the years with variable ejection fractions.  Her EF was noted to be 40-45% by echo in 2016.  Cardiac MRI in 2017 demonstrated normal EF of 59%.  Most recent echocardiogram in May 2020 with a low normal EF of 50%.  She does not have any clinical symptoms related to this.  Continue ARB, beta-blocker.   Time:   Today, I have spent 6 minutes with the patient with telehealth technology discussing the above problems.     Medication Adjustments/Labs and Tests Ordered: Current medicines are reviewed at length with the patient today.  Concerns regarding medicines are outlined above.   Tests Ordered: No orders of the defined types were placed in this encounter.   Medication Changes: No orders of the defined types were placed in this encounter.   Follow Up:  In Person in 1 year(s)  Signed, Richardson Dopp, PA-C  12/23/2018 1:42 PM    [facility]

## 2018-12-25 ENCOUNTER — Encounter: Payer: Self-pay | Admitting: Internal Medicine

## 2018-12-25 NOTE — Progress Notes (Signed)
    R  E  S  C H  E  D U  L  E  D   C  O  V  I  D              E  X  P  O  S U  R  E                                                                                                                                                                                            This very nice 58 y.o. MWF presents for a Screening /Preventative Visit & comprehensive evaluation and management of multiple medical co-morbidities.  Patient has been followed for HTN, HLD, T2_NIDDM  Prediabetes  and Vitamin D Deficiency. In Sept 2019 , she had a melanoma excised from her Rt arm.       HTN predates since 2014.  She had a normal Cardiac MRI in Feb 2017 per Dr Cathie Olden. Then ibn in Sept 2018, she had a negative Myoview. Patient's BP has been controlled at home and patient denies any cardiac symptoms as chest pain, palpitations, shortness of breath, dizziness or ankle swelling. Today's        Patient's hyperlipidemia is near  controlled with diet and medications. Patient denies myalgias or other medication SE's. Last lipids were at goal for Chol albeit elevated Trig's:  Lab Results  Component Value Date   CHOL 166 09/15/2018   HDL 31 (L) 09/15/2018   [place] not calculated 09/15/2018   TRIG 412 (H) 09/15/2018   CHOLHDL 5.4 (H) 09/15/2018       Patient has hx/o prediabetes (A1c 6.0% / 2012)   and patient denies reactive hypoglycemic symptoms, visual blurring, diabetic polys or paresthesias. Last A1c was not at goal:  Lab Results  Component Value Date   HGBA1C 5.7 (H) 06/15/2018       Finally, patient has history of Vitamin D Deficiency ("48" on tx in 2008)  &  Most recent Vitamin D was at goal:  Lab Results  Component Value Date   VD25OH 72 06/15/2018

## 2018-12-26 ENCOUNTER — Ambulatory Visit: Payer: Self-pay | Admitting: Internal Medicine

## 2018-12-26 DIAGNOSIS — Z0001 Encounter for general adult medical examination with abnormal findings: Secondary | ICD-10-CM

## 2018-12-26 DIAGNOSIS — Z111 Encounter for screening for respiratory tuberculosis: Secondary | ICD-10-CM

## 2018-12-26 DIAGNOSIS — R5383 Other fatigue: Secondary | ICD-10-CM

## 2018-12-26 DIAGNOSIS — I10 Essential (primary) hypertension: Secondary | ICD-10-CM

## 2018-12-26 DIAGNOSIS — Z136 Encounter for screening for cardiovascular disorders: Secondary | ICD-10-CM

## 2018-12-26 DIAGNOSIS — Z79899 Other long term (current) drug therapy: Secondary | ICD-10-CM

## 2018-12-26 DIAGNOSIS — E782 Mixed hyperlipidemia: Secondary | ICD-10-CM

## 2018-12-26 DIAGNOSIS — Z1211 Encounter for screening for malignant neoplasm of colon: Secondary | ICD-10-CM

## 2018-12-26 DIAGNOSIS — R7303 Prediabetes: Secondary | ICD-10-CM

## 2018-12-26 DIAGNOSIS — Z8249 Family history of ischemic heart disease and other diseases of the circulatory system: Secondary | ICD-10-CM

## 2018-12-26 DIAGNOSIS — R7309 Other abnormal glucose: Secondary | ICD-10-CM

## 2018-12-26 DIAGNOSIS — E559 Vitamin D deficiency, unspecified: Secondary | ICD-10-CM

## 2018-12-27 ENCOUNTER — Other Ambulatory Visit: Payer: Self-pay | Admitting: Internal Medicine

## 2018-12-27 DIAGNOSIS — H18593 Other hereditary corneal dystrophies, bilateral: Secondary | ICD-10-CM | POA: Diagnosis not present

## 2018-12-27 DIAGNOSIS — I10 Essential (primary) hypertension: Secondary | ICD-10-CM

## 2018-12-31 ENCOUNTER — Other Ambulatory Visit: Payer: Self-pay | Admitting: Internal Medicine

## 2019-01-09 DIAGNOSIS — L820 Inflamed seborrheic keratosis: Secondary | ICD-10-CM | POA: Diagnosis not present

## 2019-01-15 DIAGNOSIS — J029 Acute pharyngitis, unspecified: Secondary | ICD-10-CM | POA: Diagnosis not present

## 2019-01-15 DIAGNOSIS — R519 Headache, unspecified: Secondary | ICD-10-CM | POA: Diagnosis not present

## 2019-01-15 DIAGNOSIS — Z20828 Contact with and (suspected) exposure to other viral communicable diseases: Secondary | ICD-10-CM | POA: Diagnosis not present

## 2019-01-27 ENCOUNTER — Other Ambulatory Visit: Payer: Self-pay | Admitting: Adult Health

## 2019-02-02 DIAGNOSIS — Z20828 Contact with and (suspected) exposure to other viral communicable diseases: Secondary | ICD-10-CM | POA: Diagnosis not present

## 2019-02-02 DIAGNOSIS — U071 COVID-19: Secondary | ICD-10-CM | POA: Diagnosis not present

## 2019-02-03 DIAGNOSIS — U071 COVID-19: Secondary | ICD-10-CM | POA: Insufficient documentation

## 2019-02-05 ENCOUNTER — Encounter: Payer: Self-pay | Admitting: Internal Medicine

## 2019-02-05 NOTE — Progress Notes (Signed)
     R  E  S  C   H  E  D  U  L  E  D                                                                                                                                                                                                                                                         This very nice 59 y.o. MWF  presents for a Screening /Preventative Visit & comprehensive evaluation and management of multiple medical co-morbidities.  Patient has been followed for HTN, HLD,  Prediabetes  and Vitamin D Deficiency. In Sept 2019 , she had a melanoma excised from her Rt arm.      HTN predates since 2014.  In 2016, she was suspected to have had a TIA. She had a normal Cardiac MRI in Feb 2017 per Dr Cathie Olden. Then in in Sept 2018, she had a negative Myoview.   Patient's BP has been controlled at home and patient denies any cardiac symptoms as chest pain, palpitations, shortness of breath, dizziness or ankle swelling. Today's        Patient's hyperlipidemia is near controlled with diet and medications. Patient denies myalgias or other medication SE's. Last lipids were at goal for Total Chol, but Trig's were elevated:  Lab Results  Component Value Date   CHOL 166 09/15/2018   HDL 31 (L) 09/15/2018   [place] not calculated 09/15/2018   TRIG 412 (H) 09/15/2018   CHOLHDL 5.4 (H) 09/15/2018       Patient has hx/o prediabetes (A1c 6.0% / 2012)  and patient denies reactive hypoglycemic symptoms, visual blurring, diabetic polys or paresthesias. Last A1c was near goal:  Lab Results  Component Value Date   HGBA1C 5.7 (H) 06/15/2018       Finally, patient has history of Vitamin D Deficiency ("48" on tx / 2008)   and last Vitamin D was at goal:  Lab Results  Component  Value Date   VD25OH 72 06/15/2018

## 2019-02-06 ENCOUNTER — Ambulatory Visit: Payer: BC Managed Care – PPO | Admitting: Internal Medicine

## 2019-02-15 DIAGNOSIS — M9901 Segmental and somatic dysfunction of cervical region: Secondary | ICD-10-CM | POA: Diagnosis not present

## 2019-02-18 ENCOUNTER — Other Ambulatory Visit: Payer: Self-pay | Admitting: Internal Medicine

## 2019-02-18 DIAGNOSIS — R109 Unspecified abdominal pain: Secondary | ICD-10-CM

## 2019-03-19 DIAGNOSIS — M545 Low back pain: Secondary | ICD-10-CM | POA: Diagnosis not present

## 2019-03-27 DIAGNOSIS — M9901 Segmental and somatic dysfunction of cervical region: Secondary | ICD-10-CM | POA: Diagnosis not present

## 2019-03-28 DIAGNOSIS — H524 Presbyopia: Secondary | ICD-10-CM | POA: Diagnosis not present

## 2019-04-26 ENCOUNTER — Other Ambulatory Visit: Payer: Self-pay | Admitting: Physician Assistant

## 2019-04-26 DIAGNOSIS — F411 Generalized anxiety disorder: Secondary | ICD-10-CM

## 2019-05-05 DIAGNOSIS — Z03818 Encounter for observation for suspected exposure to other biological agents ruled out: Secondary | ICD-10-CM | POA: Diagnosis not present

## 2019-05-05 DIAGNOSIS — Z20828 Contact with and (suspected) exposure to other viral communicable diseases: Secondary | ICD-10-CM | POA: Diagnosis not present

## 2019-05-08 ENCOUNTER — Telehealth: Payer: Self-pay | Admitting: *Deleted

## 2019-05-08 DIAGNOSIS — M9901 Segmental and somatic dysfunction of cervical region: Secondary | ICD-10-CM | POA: Diagnosis not present

## 2019-05-08 NOTE — Telephone Encounter (Signed)
Patient had negative Covid test on 05/05/2019 at [facility].

## 2019-05-19 ENCOUNTER — Other Ambulatory Visit: Payer: Self-pay | Admitting: Physician Assistant

## 2019-05-19 DIAGNOSIS — F411 Generalized anxiety disorder: Secondary | ICD-10-CM

## 2019-05-26 DIAGNOSIS — Z03818 Encounter for observation for suspected exposure to other biological agents ruled out: Secondary | ICD-10-CM | POA: Diagnosis not present

## 2019-05-26 DIAGNOSIS — Z20828 Contact with and (suspected) exposure to other viral communicable diseases: Secondary | ICD-10-CM | POA: Diagnosis not present

## 2019-06-05 DIAGNOSIS — M9901 Segmental and somatic dysfunction of cervical region: Secondary | ICD-10-CM | POA: Diagnosis not present

## 2019-06-07 DIAGNOSIS — H40013 Open angle with borderline findings, low risk, bilateral: Secondary | ICD-10-CM | POA: Diagnosis not present

## 2019-07-04 DIAGNOSIS — Z1231 Encounter for screening mammogram for malignant neoplasm of breast: Secondary | ICD-10-CM | POA: Diagnosis not present

## 2019-07-04 LAB — HM MAMMOGRAPHY

## 2019-07-05 ENCOUNTER — Other Ambulatory Visit: Payer: Self-pay

## 2019-07-05 MED ORDER — ACYCLOVIR 5 % EX OINT: 1.0000 "application " | TOPICAL_OINTMENT | CUTANEOUS | 1 refills | Status: DC

## 2019-07-05 MED ORDER — VALACYCLOVIR HCL 500 MG PO TABS: 500.0000 mg | ORAL_TABLET | Freq: Two times a day (BID) | ORAL | 1 refills | Status: DC

## 2019-07-19 ENCOUNTER — Other Ambulatory Visit: Payer: Self-pay | Admitting: Internal Medicine

## 2019-07-19 DIAGNOSIS — F411 Generalized anxiety disorder: Secondary | ICD-10-CM

## 2019-07-20 ENCOUNTER — Encounter: Payer: Self-pay | Admitting: *Deleted

## 2019-07-20 DIAGNOSIS — M9901 Segmental and somatic dysfunction of cervical region: Secondary | ICD-10-CM | POA: Diagnosis not present

## 2019-07-21 NOTE — Progress Notes (Signed)
Assessment and Plan:   Essential hypertension - continue medications, DASH diet, exercise and monitor at home. Call if greater than 130/80.  -     CBC with Differential/Platelet -     CMP/GFR -     TSH  Chronic systolic CHF (congestive heart failure) ([place]) Weights up bu appears euvolemic; continue to monitor daily Continue current medications Continue to limit sodium, fluid Follow up cardiology  Hyperlipidemia -continue medications, check lipids, decrease fatty foods, increase activity.  -     Lipid panel  Medication management -     Magnesium  Prediabetes Discussed disease and risks Discussed diet/exercise, weight management  A1C at CPE; monitor serum glucose, weight  Mild sleep apnea Has new oral device and reports tolerating well   Melanoma of upper arm Continue q36m follow up with surgeon and dermatology  Overweight - BMI 27 Long discussion about weight loss, diet, and exercise Recommended diet heavy in fruits and veggies and low in animal meats, cheeses, and dairy products, appropriate calorie intake Patient will continue with lifestyle changes D/c meds as excellent progress with lifestyle and possible SE Discussed appropriate weight for height and initial goal (<135lb) Follow up at next visit   Continue diet and meds as discussed. Further disposition pending results of labs. No future appointments.  HPI 59 y.o. female  presents for 3 month follow up with hypertension, hyperlipidemia, prediabetes and vitamin D. She is on oral device for mild sleep apnea per Dr. Brett Fairy. Asthma well managed by singulair and rare albuterol. Follows with derm/surgeon due to R upper arm melanoma removed in 09/2017 and has done well, following q79m between derm/surgeon.   She had left knee in Sept 8 with Dr. Okey Dupre at [facility] has done well since. Bil hip pain worse at night, mild arthritis per ortho and managing with OTC analgestics.   She is on lexapro for anxiety/hot flashes. Didn't see  benefit with buspar, felt jittery. On HRT via OBGYN, plans to discuss adding topical testosterone.   BMI is Body mass index is 27.73 kg/m., she has been working on diet and exercise, energy levels are down and not walking as much, very busy at work, still getting 6000 steps, working back up to 10000.  Just back from [place].  Wt Readings from Last 3 Encounters:  07/24/19 142 lb (64.4 kg)  12/23/18 135 lb (61.2 kg)  11/04/18 137 lb (62.1 kg)   Her blood pressure has been controlled at home, today their BP is BP: 120/80 She does workout. She denies chest pain, shortness of breath, dizziness.  She has a history of CHF, last echo 2016, stress test 09/2016, EF 40-45% with grade 2 diastolic dysfunction.  She reports swelling is improved.    She is on cholesterol medication, lipitor 20 mg 3 x a week, fish/krill oil daily and denies myalgias. Her cholesterol is not at goal, trigs remain elevated. She reports 1-2 glasses of wine in the evening.The cholesterol last visit was:   Lab Results  Component Value Date   CHOL 166 09/15/2018   HDL 31 (L) 09/15/2018   [place]  09/15/2018     Comment:     . LDL cholesterol not calculated. Triglyceride levels greater than 400 mg/dL invalidate calculated LDL results. . Reference range: <100 . Desirable range <100 mg/dL for primary prevention;   <70 mg/dL for patients with CHD or diabetic patients  with > or = 2 CHD risk factors. Marland Kitchen LDL-C is now calculated using the Martin-Hopkins  calculation, which is a  validated novel method providing  better accuracy than the Friedewald equation in the  estimation of LDL-C.  Cresenciano Genre et al. Annamaria Helling. 3419;379(02): 2061-2068  (http://education.[employer].com/faq/FAQ164)    TRIG 412 (H) 09/15/2018   CHOLHDL 5.4 (H) 09/15/2018   She has been working on diet and exercise for prediabetes, and denies paresthesia of the feet, polydipsia and polyuria. Last A1C in the office was:  Lab Results  Component Value Date    HGBA1C 5.7 (H) 06/15/2018    Last GFR:  Lab Results  Component Value Date   GFRNONAA 87 09/15/2018   Patient is on Vitamin D supplement.   Lab Results  Component Value Date   VD25OH 72 06/15/2018     Current Medications:   Current Outpatient Medications (Endocrine & Metabolic):  .  estradiol (ESTRACE) 0.5 MG tablet, Take 0.5 mg by mouth daily. .  medroxyPROGESTERone (PROVERA) 2.5 MG tablet, Take 2.5 mg by mouth daily.  Current Outpatient Medications (Cardiovascular):  .  amLODipine (NORVASC) 10 MG tablet, Take 1 tablet Daily for BP .  atorvastatin (LIPITOR) 20 MG tablet, Take 1 tablet (20 mg total) by mouth 3 (three) times a week. .  furosemide (LASIX) 40 MG tablet, TAKE 1 TABLET EVERY DAY FOR BLOOD PRESSURE AND FLUID .  metoprolol succinate (TOPROL-XL) 100 MG 24 hr tablet, Take 1/2 to 1 tablet Daily for BP .  olmesartan (BENICAR) 40 MG tablet, TAKE 1/2 TABLET BY MOUTH EVERY DAY IF BLOOD PRESSURE 140/90, TAKE 1 FULL TALBET  Current Outpatient Medications (Respiratory):  .  albuterol (VENTOLIN HFA) 108 (90 Base) MCG/ACT inhaler, Inhale 2 puffs into the lungs every 4 (four) hours as needed for wheezing or shortness of breath. .  fluticasone (FLONASE) 50 MCG/ACT nasal spray, SPRAY 2 SPRAYS INTO EACH NOSTRIL EVERY DAY .  montelukast (SINGULAIR) 10 MG tablet, Take 1 tablet daily for allergies.  Current Outpatient Medications (Analgesics):  .  aspirin EC 81 MG tablet, Take 1 tablet (81 mg total) by mouth daily. Marland Kitchen  ibuprofen (ADVIL) 800 MG tablet, Take 1 tablet 3 x /day with Food if Needed for Pain & Inflammation / Cramping   Current Outpatient Medications (Other):  .  acyclovir ointment (ZOVIRAX) 5 %, Apply 1 application topically every 3 (three) hours. .  Adapalene-Benzoyl Peroxide (EPIDUO) 0.1-2.5 % gel, Apply 1 application topically as needed (acne). .  Cholecalciferol (VITAMIN D PO), Take 5,000 Units by mouth daily.  Marland Kitchen  escitalopram (LEXAPRO) 10 MG tablet, TAKE 1 TABLET DAILY  FOR MOOD & ANXIETY .  Omega-3 Fatty Acids (FISH OIL PO), Take 1 capsule by mouth daily. .  valACYclovir (VALTREX) 500 MG tablet, Take 500 mg by mouth 2 (two) times daily as needed (fever blister outbreak). .  busPIRone (BUSPAR) 15 MG tablet, TAKE 1/2 TO 1 TABLET BY MOUTH TWICE DAILY FOR ANXIETY (Patient not taking: Reported on 07/24/2019)  Medical History:  Past Medical History:  Diagnosis Date  . Allergy   . Anxiety   . Asthma   . Cardiomyopathy ([place])    a. Echo 8/16:  EF 40-45%, apical and ant-septal HK, Gr 2 DD  . CHF (congestive heart failure) ([place])   . Diverticula, colon   . Family history of adverse reaction to anesthesia    sisiter had PONV  . Headache    rare migraines  . History of cardiovascular stress test    Myoview 8/16:  EF 48%, anterior and apical defect c/w breast atten, no ischemia; Low Risk // Nuclear stress test 9/18: Low risk  stress nuclear study with fixed septal defect consistent with LBBB vs prior infarct; no ischemia; EF 53 with septal akinesis.  . Hyperlipidemia   . Hypertension   . Irregular periods/menstrual cycles   . LBBB (left bundle branch block)   . Melanoma ([place])    right arm  . Seasonal allergies   . Stroke [facility])    TIA august 2016  . TIA (transient ischemic attack)   . Torn meniscus    Allergies:  Allergies  Allergen Reactions  . Oxycodone Itching    Itching: Trunk, palms of hands, legs, back      Review of Systems  Constitutional: Negative for malaise/fatigue and weight loss.  HENT: Negative.  Negative for hearing loss and tinnitus.   Eyes: Negative.  Negative for blurred vision and double vision.  Respiratory: Negative.  Negative for cough, shortness of breath and wheezing.   Cardiovascular: Negative.  Negative for chest pain, palpitations, orthopnea, claudication and leg swelling.  Gastrointestinal: Negative.  Negative for abdominal pain, blood in stool, constipation, diarrhea, heartburn, melena, nausea and vomiting.  Genitourinary:  Negative.   Musculoskeletal: Positive for joint pain (bil hips intermittently ). Negative for back pain and myalgias.  Skin: Negative.  Negative for rash.  Neurological: Negative.  Negative for dizziness, tingling, sensory change, weakness and headaches.  Endo/Heme/Allergies: Negative.  Negative for polydipsia.  Psychiatric/Behavioral: Negative.   All other systems reviewed and are negative.    Family history- Review and unchanged Social history- Review and unchanged Physical Exam: BP 120/80   Pulse 62   Temp (!) 97.2 F (36.2 C)   Wt 142 lb (64.4 kg)   SpO2 97%   BMI 27.73 kg/m  Wt Readings from Last 3 Encounters:  07/24/19 142 lb (64.4 kg)  12/23/18 135 lb (61.2 kg)  11/04/18 137 lb (62.1 kg)   General Appearance: Well nourished, in no apparent distress. Eyes: PERRLA, EOMs, conjunctiva no swelling or erythema Sinuses: No Frontal/maxillary tenderness ENT/Mouth: Ext aud canals clear, TMs without erythema, bulging. No erythema, swelling, or exudate on post pharynx.  Tonsils not swollen or erythematous. Hearing normal.  Neck: Supple, thyroid normal.  Respiratory: Respiratory effort normal, BS equal bilaterally without rales, rhonchi, wheezing or stridor.  Cardio: RRR with no MRGs. Brisk peripheral pulses without edema.  Abdomen: Soft, + BS.  Non tender, no guarding, rebound, hernias, masses. Lymphatics: Non tender without lymphadenopathy.  Musculoskeletal: Full ROM 5/5 strength, normal gait. Well healed surgical scars bil knees Skin: Warm, dry without rashes, lesions, ecchymosis.  Neuro: Cranial nerves intact. Normal muscle tone, no cerebellar symptoms. Sensation intact.  Psych: Awake and oriented X 3, normal affect, Insight and Judgment appropriate.    Gorden Harms Arnola Crittendon 9:54 AM

## 2019-07-24 ENCOUNTER — Other Ambulatory Visit: Payer: Self-pay

## 2019-07-24 ENCOUNTER — Encounter: Payer: Self-pay | Admitting: Adult Health

## 2019-07-24 ENCOUNTER — Ambulatory Visit: Payer: BC Managed Care – PPO | Admitting: Adult Health

## 2019-07-24 VITALS — BP 120/80 | HR 62 | Temp 97.2°F | Wt 142.0 lb

## 2019-07-24 DIAGNOSIS — Z8673 Personal history of transient ischemic attack (TIA), and cerebral infarction without residual deficits: Secondary | ICD-10-CM

## 2019-07-24 DIAGNOSIS — E559 Vitamin D deficiency, unspecified: Secondary | ICD-10-CM | POA: Diagnosis not present

## 2019-07-24 DIAGNOSIS — E782 Mixed hyperlipidemia: Secondary | ICD-10-CM

## 2019-07-24 DIAGNOSIS — Z79899 Other long term (current) drug therapy: Secondary | ICD-10-CM | POA: Diagnosis not present

## 2019-07-24 DIAGNOSIS — Z6825 Body mass index (BMI) 25.0-25.9, adult: Secondary | ICD-10-CM

## 2019-07-24 DIAGNOSIS — R7309 Other abnormal glucose: Secondary | ICD-10-CM

## 2019-07-24 DIAGNOSIS — F411 Generalized anxiety disorder: Secondary | ICD-10-CM

## 2019-07-24 DIAGNOSIS — I5022 Chronic systolic (congestive) heart failure: Secondary | ICD-10-CM | POA: Diagnosis not present

## 2019-07-24 DIAGNOSIS — I10 Essential (primary) hypertension: Secondary | ICD-10-CM | POA: Diagnosis not present

## 2019-07-24 DIAGNOSIS — J45909 Unspecified asthma, uncomplicated: Secondary | ICD-10-CM

## 2019-07-24 NOTE — Patient Instructions (Addendum)
Goals    . Reduce alcohol intake     Limit to <4 glasses per week     . Weight (lb) < 135 lb (61.2 kg)       Might try adding tumeric + black pepper fruit/bioprene or tart cherry supplement - natural antiinflammatory that is safe to take  Can take ~3500 mg of acetaminophen daily - very safe unless any liver issues   Limit starches and simple carbs - push high fiber (whole grains, beans, fruits/veggies, nuts/seeds)  Reduce wine to <4 per week    Hip Exercises Ask your health care provider which exercises are safe for you. Do exercises exactly as told by your health care provider and adjust them as directed. It is normal to feel mild stretching, pulling, tightness, or discomfort as you do these exercises. Stop right away if you feel sudden pain or your pain gets worse. Do not begin these exercises until told by your health care provider. Stretching and range-of-motion exercises These exercises warm up your muscles and joints and improve the movement and flexibility of your hip. These exercises also help to relieve pain, numbness, and tingling. You may be asked to limit your range of motion if you had a hip replacement. Talk to your health care provider about these restrictions. Hamstrings, supine  1. Lie on your back (supine position). 2. Loop a belt or towel over the ball of your left / right foot. The ball of your foot is on the walking surface, right under your toes. 3. Straighten your left / right knee and slowly pull on the belt or towel to raise your leg until you feel a gentle stretch behind your knee (hamstring). ? Do not let your knee bend while you do this. ? Keep your other leg flat on the floor. 4. Hold this position for __________ seconds. 5. Slowly return your leg to the starting position. Repeat __________ times. Complete this exercise __________ times a day. Hip rotation  1. Lie on your back on a firm surface. 2. With your left / right hand, gently pull your left /  right knee toward the shoulder that is on the same side of the body. Stop when your knee is pointing toward the ceiling. 3. Hold your left / right ankle with your other hand. 4. Keeping your knee steady, gently pull your left / right ankle toward your other shoulder until you feel a stretch in your buttocks. ? Keep your hips and shoulders firmly planted while you do this stretch. 5. Hold this position for __________ seconds. Repeat __________ times. Complete this exercise __________ times a day. Seated stretch This exercise is sometimes called hamstrings and adductors stretch. 1. Sit on the floor with your legs stretched wide. Keep your knees straight during this exercise. 2. Keeping your head and back in a straight line, bend at your waist to reach for your left foot (position A). You should feel a stretch in your right inner thigh (adductors). 3. Hold this position for __________ seconds. Then slowly return to the upright position. 4. Keeping your head and back in a straight line, bend at your waist to reach forward (position B). You should feel a stretch behind both of your thighs and knees (hamstrings). 5. Hold this position for __________ seconds. Then slowly return to the upright position. 6. Keeping your head and back in a straight line, bend at your waist to reach for your right foot (position C). You should feel a stretch in your left  inner thigh (adductors). 7. Hold this position for __________ seconds. Then slowly return to the upright position. Repeat __________ times. Complete this exercise __________ times a day. Lunge This exercise stretches the muscles of the hip (hip flexors). 1. Place your left / right knee on the floor and bend your other knee so that is directly over your ankle. You should be half-kneeling. 2. Keep good posture with your head over your shoulders. 3. Tighten your buttocks to point your tailbone downward. This will prevent your back from arching too  much. 4. You should feel a gentle stretch in the front of your left / right thigh and hip. If you do not feel a stretch, slide your other foot forward slightly and then slowly lunge forward with your chest up until your knee once again lines up over your ankle. ? Make sure your tailbone continues to point downward. 5. Hold this position for __________ seconds. 6. Slowly return to the starting position. Repeat __________ times. Complete this exercise __________ times a day. Strengthening exercises These exercises build strength and endurance in your hip. Endurance is the ability to use your muscles for a long time, even after they get tired. Bridge This exercise strengthens the muscles of your hip (hip extensors). 1. Lie on your back on a firm surface with your knees bent and your feet flat on the floor. 2. Tighten your buttocks muscles and lift your bottom off the floor until the trunk of your body and your hips are level with your thighs. ? Do not arch your back. ? You should feel the muscles working in your buttocks and the back of your thighs. If you do not feel these muscles, slide your feet 1-2 inches (2.5-5 cm) farther away from your buttocks. 3. Hold this position for __________ seconds. 4. Slowly lower your hips to the starting position. 5. Let your muscles relax completely between repetitions. Repeat __________ times. Complete this exercise __________ times a day. Straight leg raises, side-lying This exercise strengthens the muscles that move the hip joint away from the center of the body (hip abductors). 1. Lie on your side with your left / right leg in the top position. Lie so your head, shoulder, hip, and knee line up. You may bend your bottom knee slightly to help you balance. 2. Roll your hips slightly forward, so your hips are stacked directly over each other and your left / right knee is facing forward. 3. Leading with your heel, lift your top leg 4-6 inches (10-15 cm). You  should feel the muscles in your top hip lifting. ? Do not let your foot drift forward. ? Do not let your knee roll toward the ceiling. 4. Hold this position for __________ seconds. 5. Slowly return to the starting position. 6. Let your muscles relax completely between repetitions. Repeat __________ times. Complete this exercise __________ times a day. Straight leg raises, side-lying This exercise strengthens the muscles that move the hip joint toward the center of the body (hip adductors). 1. Lie on your side with your left / right leg in the bottom position. Lie so your head, shoulder, hip, and knee line up. You may place your upper foot in front to help you balance. 2. Roll your hips slightly forward, so your hips are stacked directly over each other and your left / right knee is facing forward. 3. Tense the muscles in your inner thigh and lift your bottom leg 4-6 inches (10-15 cm). 4. Hold this position for __________ seconds.  5. Slowly return to the starting position. 6. Let your muscles relax completely between repetitions. Repeat __________ times. Complete this exercise __________ times a day. Straight leg raises, supine This exercise strengthens the muscles in the front of your thigh (quadriceps). 1. Lie on your back (supine position) with your left / right leg extended and your other knee bent. 2. Tense the muscles in the front of your left / right thigh. You should see your kneecap slide up or see increased dimpling just above your knee. 3. Keep these muscles tight as you raise your leg 4-6 inches (10-15 cm) off the floor. Do not let your knee bend. 4. Hold this position for __________ seconds. 5. Keep these muscles tense as you lower your leg. 6. Relax the muscles slowly and completely between repetitions. Repeat __________ times. Complete this exercise __________ times a day. Hip abductors, standing This exercise strengthens the muscles that move the leg and hip joint away from  the center of the body (hip abductors). 1. Tie one end of a rubber exercise band or tubing to a secure surface, such as a chair, table, or pole. 2. Loop the other end of the band or tubing around your left / right ankle. 3. Keeping your ankle with the band or tubing directly opposite the secured end, step away until there is tension in the tubing or band. Hold on to a chair, table, or pole as needed for balance. 4. Lift your left / right leg out to your side. While you do this: ? Keep your back upright. ? Keep your shoulders over your hips. ? Keep your toes pointing forward. ? Make sure to use your hip muscles to slowly lift your leg. Do not tip your body or forcefully lift your leg. 5. Hold this position for __________ seconds. 6. Slowly return to the starting position. Repeat __________ times. Complete this exercise __________ times a day. Squats This exercise strengthens the muscles in the front of your thigh (quadriceps). 1. Stand in a door frame so your feet and knees are in line with the frame. You may place your hands on the frame for balance. 2. Slowly bend your knees and lower your hips like you are going to sit in a chair. ? Keep your lower legs in a straight-up-and-down position. ? Do not let your hips go lower than your knees. ? Do not bend your knees lower than told by your health care provider. ? If your hip pain increases, do not bend as low. 3. Hold this position for ___________ seconds. 4. Slowly push with your legs to return to standing. Do not use your hands to pull yourself to standing. Repeat __________ times. Complete this exercise __________ times a day. This information is not intended to replace advice given to you by your health care provider. Make sure you discuss any questions you have with your health care provider. Document Revised: 08/04/2018 Document Reviewed: 11/09/2017 Elsevier Patient Education  [place].

## 2019-07-25 ENCOUNTER — Other Ambulatory Visit: Payer: Self-pay | Admitting: Adult Health

## 2019-07-25 LAB — LIPID PANEL
Cholesterol: 198 mg/dL (ref <200)
HDL: 34 mg/dL — ABNORMAL LOW (ref >=50)
LDL Cholesterol (Calc): 114 mg/dL (calc) — ABNORMAL HIGH
Non-HDL Cholesterol (Calc): 164 mg/dL (calc) — ABNORMAL HIGH (ref <130)
Total CHOL/HDL Ratio: 5.8 (calc) — ABNORMAL HIGH (ref <5.0)
Triglycerides: 349 mg/dL — ABNORMAL HIGH (ref <150)

## 2019-07-25 LAB — COMPLETE METABOLIC PANEL WITH GFR
AG Ratio: 1.8 (calc) (ref 1.0–2.5)
ALT: 34 U/L — ABNORMAL HIGH (ref 6–29)
AST: 28 U/L (ref 10–35)
Albumin: 4.5 g/dL (ref 3.6–5.1)
Alkaline phosphatase (APISO): 88 U/L (ref 37–153)
BUN: 12 mg/dL (ref 7–25)
CO2: 25 mmol/L (ref 20–32)
Calcium: 9.3 mg/dL (ref 8.6–10.4)
Chloride: 102 mmol/L (ref 98–110)
Creat: 0.75 mg/dL (ref 0.50–1.05)
GFR, Est African American: 102 mL/min/{1.73_m2} (ref >=60)
GFR, Est Non African American: 88 mL/min/{1.73_m2} (ref >=60)
Globulin: 2.5 g/dL (calc) (ref 1.9–3.7)
Glucose, Bld: 115 mg/dL — ABNORMAL HIGH (ref 65–99)
Potassium: 4.2 mmol/L (ref 3.5–5.3)
Sodium: 139 mmol/L (ref 135–146)
Total Bilirubin: 0.5 mg/dL (ref 0.2–1.2)
Total Protein: 7 g/dL (ref 6.1–8.1)

## 2019-07-25 LAB — CBC WITH DIFFERENTIAL/PLATELET
Absolute Monocytes: 474 cells/uL (ref 200–950)
Basophils Absolute: 58 cells/uL (ref 0–200)
Basophils Relative: 0.9 %
Eosinophils Absolute: 211 cells/uL (ref 15–500)
Eosinophils Relative: 3.3 %
HCT: 47.2 % — ABNORMAL HIGH (ref 35.0–45.0)
Hemoglobin: 15.3 g/dL (ref 11.7–15.5)
Lymphs Abs: 2106 cells/uL (ref 850–3900)
MCH: 30.1 pg (ref 27.0–33.0)
MCHC: 32.4 g/dL (ref 32.0–36.0)
MCV: 92.7 fL (ref 80.0–100.0)
MPV: 13.2 fL — ABNORMAL HIGH (ref 7.5–12.5)
Monocytes Relative: 7.4 %
Neutro Abs: 3552 cells/uL (ref 1500–7800)
Neutrophils Relative %: 55.5 %
Platelets: 323 10*3/uL (ref 140–400)
RBC: 5.09 10*6/uL (ref 3.80–5.10)
RDW: 12.9 % (ref 11.0–15.0)
Total Lymphocyte: 32.9 %
WBC: 6.4 10*3/uL (ref 3.8–10.8)

## 2019-07-25 LAB — MAGNESIUM: Magnesium: 2.1 mg/dL (ref 1.5–2.5)

## 2019-07-25 LAB — HEMOGLOBIN A1C
Hgb A1c MFr Bld: 5.8 % of total Hgb — ABNORMAL HIGH (ref <5.7)
Mean Plasma Glucose: 120 (calc)
eAG (mmol/L): 6.6 (calc)

## 2019-07-25 LAB — TSH: TSH: 1.22 mIU/L (ref 0.40–4.50)

## 2019-07-25 MED ORDER — ATORVASTATIN CALCIUM 20 MG PO TABS
ORAL_TABLET | ORAL | 3 refills | Status: DC
Start: 2019-07-25 — End: 2020-04-16

## 2019-08-14 DIAGNOSIS — C4361 Malignant melanoma of right upper limb, including shoulder: Secondary | ICD-10-CM | POA: Diagnosis not present

## 2019-08-14 DIAGNOSIS — R2242 Localized swelling, mass and lump, left lower limb: Secondary | ICD-10-CM | POA: Diagnosis not present

## 2019-08-14 DIAGNOSIS — R2232 Localized swelling, mass and lump, left upper limb: Secondary | ICD-10-CM | POA: Diagnosis not present

## 2019-08-23 ENCOUNTER — Other Ambulatory Visit: Payer: Self-pay

## 2019-08-23 ENCOUNTER — Ambulatory Visit: Payer: BC Managed Care – PPO | Admitting: Adult Health

## 2019-08-23 ENCOUNTER — Encounter: Payer: Self-pay | Admitting: Adult Health

## 2019-08-23 VITALS — BP 112/80 | HR 53 | Temp 97.5°F | Wt 142.0 lb

## 2019-08-23 DIAGNOSIS — T63441A Toxic effect of venom of bees, accidental (unintentional), initial encounter: Secondary | ICD-10-CM | POA: Diagnosis not present

## 2019-08-23 MED ORDER — PREDNISONE 20 MG PO TABS
ORAL_TABLET | ORAL | 0 refills | Status: DC
Start: 2019-08-23 — End: 2019-12-11

## 2019-08-23 NOTE — Progress Notes (Signed)
Assessment and Plan:  Gail Bailey was seen today for insect bite.  Diagnoses and all orders for this visit:  Bee sting, accidental or unintentional, initial encounter Mild localized reaction; may try ice compresses, will give short steroid taper May try claritin/allegra, famotidine Information given for preventing future stings Follow up if any worsening local redness, heat, swelling or other appearance of possible bacterial infection -     predniSONE (DELTASONE) 20 MG tablet; 2 tablets daily for 3 days, 1 tablet daily for 4 days.  Further disposition pending results of labs. Discussed med's effects and SE's.   Over 15 minutes of exam, counseling, chart review, and critical decision making was performed.   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   She reports was pruning her rose bush, disturbed a hive, was stung 2-3 times, R hand and left side of her back, she reports feeling fairly well, just very itchy. She has been taking 25 mg benadryl q8h since she was stung, minimal benefit with itching and can't tolerate more frequently due to sedation.   Denies shortness of breath, fast racing heart, palpitations, HA, nausea, dizziness, syncope.   Tdap 2016  Past Medical History:  Diagnosis Date  . Allergy   . Anxiety   . Asthma   . Cardiomyopathy ([place])    a. Echo 8/16:  EF 40-45%, apical and ant-septal HK, Gr 2 DD  . CHF (congestive heart failure) ([place])   . Diverticula, colon   . Family history  of adverse reaction to anesthesia    sisiter had PONV  . Headache    rare migraines  . History of cardiovascular stress test    Myoview 8/16:  EF 48%, anterior and apical defect c/w breast atten, no ischemia; Low Risk // Nuclear stress test 9/18: Low risk stress nuclear study with fixed septal defect consistent with LBBB vs prior infarct; no ischemia; EF 53 with septal akinesis.  . Hyperlipidemia   . Hypertension   . Irregular periods/menstrual cycles   . LBBB (left bundle branch block)   . Melanoma ([place])    right arm  . Seasonal allergies   . Stroke [facility])    TIA august 2016  . TIA (transient ischemic attack)   . Torn meniscus      Allergies  Allergen Reactions  . Oxycodone Itching    Itching: Trunk, palms of hands, legs, back      Current Outpatient Medications on File Prior to Visit  Medication Sig  . acyclovir ointment (ZOVIRAX) 5 % Apply 1 application topically every 3 (three) hours. (Patient taking differently: Apply 1 application topically as needed. )  . Adapalene-Benzoyl Peroxide (EPIDUO) 0.1-2.5 % gel Apply 1 application topically as needed (acne).  Marland Kitchen albuterol (VENTOLIN HFA) 108 (90 Base) MCG/ACT inhaler Inhale 2 puffs into the lungs every 4 (four) hours as needed for wheezing or shortness of breath.  Marland Kitchen amLODipine (NORVASC) 10 MG tablet Take 1 tablet Daily for BP  . aspirin EC 81 MG tablet Take 1 tablet (81 mg total) by  mouth daily.  Marland Kitchen atorvastatin (LIPITOR) 20 MG tablet Take 1 tab 5 days a week or as directed for cholesterol.  . Cholecalciferol (VITAMIN D PO) Take 5,000 Units by mouth daily.   Marland Kitchen escitalopram (LEXAPRO) 10 MG tablet TAKE 1 TABLET DAILY FOR MOOD & ANXIETY  . estradiol (ESTRACE) 0.5 MG tablet Take 0.5 mg by mouth daily.  . fluticasone (FLONASE) 50 MCG/ACT nasal spray SPRAY 2 SPRAYS INTO EACH NOSTRIL EVERY DAY  . furosemide (LASIX) 40 MG tablet TAKE 1 TABLET EVERY DAY FOR BLOOD PRESSURE AND FLUID  . ibuprofen (ADVIL) 800 MG tablet Take 1 tablet 3 x /day  with Food if Needed for Pain & Inflammation / Cramping  . medroxyPROGESTERone (PROVERA) 2.5 MG tablet Take 2.5 mg by mouth daily.  . metoprolol succinate (TOPROL-XL) 100 MG 24 hr tablet Take 1/2 to 1 tablet Daily for BP  . montelukast (SINGULAIR) 10 MG tablet Take 1 tablet daily for allergies.  Marland Kitchen olmesartan (BENICAR) 40 MG tablet TAKE 1/2 TABLET BY MOUTH EVERY DAY IF BLOOD PRESSURE 140/90, TAKE 1 FULL TALBET  . Omega-3 Fatty Acids (FISH OIL PO) Take 1 capsule by mouth daily.  . valACYclovir (VALTREX) 500 MG tablet Take 500 mg by mouth 2 (two) times daily as needed (fever blister outbreak).   No current facility-administered medications on file prior to visit.    ROS: all negative except above.   Physical Exam:  BP 112/80   Pulse (!) 53   Temp (!) 97.5 F (36.4 C)   Wt 142 lb (64.4 kg)   SpO2 98%   BMI 27.73 kg/m   General Appearance: Well nourished, in no apparent distress. Eyes: PERRLA, conjunctiva no swelling or erythema ENT/Mouth: No erythema, swelling, or exudate on post pharynx.  Tonsils not swollen or erythematous. Hearing normal.  Neck: Supple Respiratory: Respiratory effort normal, BS equal bilaterally without rales, rhonchi, wheezing or stridor.  Cardio: RRR with no MRGs. Brisk peripheral pulses without edema.  Abdomen: Soft, + BS.  Non tender Lymphatics: Non tender without lymphadenopathy.  Musculoskeletal: normal gait.  Skin: Warm, dry without rashes, lesions, ecchymosis. She has mild soft tissue swelling and faint erythema to R hand fifth digit and to left flank. No discharge, lump. No stinger present.  Neuro: Cranial nerves intact. Normal muscle tone, Sensation intact.  Psych: Awake and oriented X 3, normal affect, Insight and Judgment appropriate.     Izora Ribas, NP 2:27 PM [facility]

## 2019-08-23 NOTE — Patient Instructions (Addendum)
Bee, Wasp, or Hornet Sting, Adult Bees, wasps, and hornets are part of a family of insects that can sting people. These stings can cause pain and inflammation, but they are usually not serious. However, some people may have an allergic reaction to a sting. This can cause the symptoms to be more severe. What increases the risk? You may be at a greater risk of getting stung if you:  Provoke a stinging insect by swatting or disturbing it.  Wear strong-smelling soaps, deodorants, or body sprays.  Spend time outdoors near gardens with flowers or fruit trees or in clothes that expose skin.  Eat or drink outside. What are the signs or symptoms? Common symptoms of this condition include:  A red lump in the skin that sometimes has a tiny hole in the center. In some cases, a stinger may be in the center of the wound.  Pain and itching at the sting site.  Redness and swelling around the sting site. If you have an allergic reaction (localized allergic reaction), the swelling and redness may spread out from the sting site. In some cases, this reaction can continue to develop over the next 24-48 hours. In rare cases, a person may have a severe allergic reaction (anaphylactic reaction) to a sting. Symptoms of an anaphylactic reaction may include:  Wheezing or difficulty breathing.  Raised, itchy, red patches on the skin (hives).  Nausea or vomiting.  Abdominal cramping.  Diarrhea.  Tightness in the chest or chest pain.  Dizziness or fainting.  Redness of the face (flushing).  Hoarse voice.  Swollen tongue, lips, or face. How is this diagnosed? This condition is usually diagnosed based on your symptoms and medical history as well as a physical exam. You may have an allergy test to determine if you are allergic to the substance that the insect injected during the sting (venom). How is this treated? If you were stung by a bee, the stinger and a small sac of venom may be in the wound. It is  important to remove the stinger as soon as possible. You can do this by brushing across the wound with gauze, a fingernail, or a flat card such as a credit card. Removing the stinger can help reduce the severity of your body's reaction to the sting. Most stings can be treated with:  Icing to reduce swelling in the area.  Medicines (antihistamines) to treat itching or an allergic reaction.  Medicines to help reduce pain. These may be medicines that you take by mouth, or medicated creams or lotions that you apply to your skin. Pay close attention to your symptoms after you have been stung. If possible, have someone stay with you to make sure you do not have an allergic reaction. If you have any signs of an allergic reaction, call your health care provider. If you have ever had a severe allergic reaction, your health care provider may give you an inhaler or injectable medicine (epinephrine auto-injector) to use if necessary. Follow these instructions at home:   Wash the sting site 2-3 times each day with soap and water as told by your health care provider.  Apply or take over-the-counter and prescription medicines only as told by your health care provider.  If directed, apply ice to the sting area. ? Put ice in a plastic bag. ? Place a towel between your skin and the bag. ? Leave the ice on for 20 minutes, 2-3 times a day.  Do not scratch the sting area.  If   you had a severe allergic reaction to a sting, you may need: ? To wear a medical bracelet or necklace that lists the allergy. ? To learn when and how to use an anaphylaxis kit or epinephrine injection. Your family members and coworkers may also need to learn this. ? To carry an anaphylaxis kit or epinephrine injection with you at all times. How is this prevented?  Avoid swatting at stinging insects and disturbing insect nests.  Do not use fragrant soaps or lotions.  Wear shoes, pants, and long sleeves when spending time outdoors,  especially in grassy areas where stinging insects are common.  Keep outdoor areas free from nests or hives.  Keep food and drink containers covered when eating outdoors.  Avoid working or sitting near flowering plants, if possible.  Wear gloves if you are gardening or working outdoors.  If an attack by a stinging insect or a swarm seems likely in the moment, move away from the area or find a barrier between you and the insect(s), such as a door. Contact a health care provider if:  Your symptoms do not get better in 2-3 days.  You have redness, swelling, or pain that spreads beyond the area of the sting.  You have a fever. Get help right away if: You have symptoms of a severe allergic reaction. These include:  Wheezing or difficulty breathing.  Tightness in the chest or chest pain.  Light-headedness or fainting.  Itchy, raised, red patches on the skin.  Nausea or vomiting.  Abdominal cramping.  Diarrhea.  A swollen tongue or lips, or trouble swallowing.  Dizziness or fainting. Summary  Stings from bees, wasps, and hornets can cause pain and inflammation, but they are usually not serious. However, some people may have an allergic reaction to a sting. This can cause the symptoms to be more severe.  Pay close attention to your symptoms after you have been stung. If possible, have someone stay with you to make sure you do not have an allergic reaction.  Call your health care provider if you have any signs of an allergic reaction. This information is not intended to replace advice given to you by your health care provider. Make sure you discuss any questions you have with your health care provider. Document Revised: 12/24/2016 Document Reviewed: 03/05/2016 Elsevier Patient Education  2020 [employer]  

## 2019-09-04 ENCOUNTER — Other Ambulatory Visit: Payer: Self-pay | Admitting: Internal Medicine

## 2019-09-04 DIAGNOSIS — R109 Unspecified abdominal pain: Secondary | ICD-10-CM

## 2019-09-04 DIAGNOSIS — M9901 Segmental and somatic dysfunction of cervical region: Secondary | ICD-10-CM | POA: Diagnosis not present

## 2019-09-19 DIAGNOSIS — M9901 Segmental and somatic dysfunction of cervical region: Secondary | ICD-10-CM | POA: Diagnosis not present

## 2019-09-25 DIAGNOSIS — M9901 Segmental and somatic dysfunction of cervical region: Secondary | ICD-10-CM | POA: Diagnosis not present

## 2019-09-28 DIAGNOSIS — M9901 Segmental and somatic dysfunction of cervical region: Secondary | ICD-10-CM | POA: Diagnosis not present

## 2019-10-03 DIAGNOSIS — M9901 Segmental and somatic dysfunction of cervical region: Secondary | ICD-10-CM | POA: Diagnosis not present

## 2019-10-16 DIAGNOSIS — M9901 Segmental and somatic dysfunction of cervical region: Secondary | ICD-10-CM | POA: Diagnosis not present

## 2019-11-17 ENCOUNTER — Other Ambulatory Visit: Payer: Self-pay | Admitting: Internal Medicine

## 2019-11-21 ENCOUNTER — Other Ambulatory Visit: Payer: Self-pay | Admitting: Adult Health

## 2019-11-22 DIAGNOSIS — M9901 Segmental and somatic dysfunction of cervical region: Secondary | ICD-10-CM | POA: Diagnosis not present

## 2019-12-11 ENCOUNTER — Ambulatory Visit (INDEPENDENT_AMBULATORY_CARE_PROVIDER_SITE_OTHER): Payer: BC Managed Care – PPO | Admitting: Adult Health Nurse Practitioner

## 2019-12-11 ENCOUNTER — Encounter: Payer: Self-pay | Admitting: Adult Health Nurse Practitioner

## 2019-12-11 ENCOUNTER — Other Ambulatory Visit: Payer: Self-pay

## 2019-12-11 VITALS — BP 128/70 | HR 56 | Temp 98.1°F | Ht 61.0 in | Wt 149.0 lb

## 2019-12-11 DIAGNOSIS — Z1152 Encounter for screening for COVID-19: Secondary | ICD-10-CM | POA: Diagnosis not present

## 2019-12-11 DIAGNOSIS — J0140 Acute pansinusitis, unspecified: Secondary | ICD-10-CM

## 2019-12-11 DIAGNOSIS — H6503 Acute serous otitis media, bilateral: Secondary | ICD-10-CM | POA: Diagnosis not present

## 2019-12-11 LAB — POC COVID19 BINAXNOW: SARS Coronavirus 2 Ag: NEGATIVE

## 2019-12-11 NOTE — Patient Instructions (Addendum)
   Take Citrazine (Zyrtec) nightly to help dry up fluid.  NorelAD  Every 4-6 hours.  Tylenol cold and sinus has similar ingredients.  Continue the water intake.  If you have not improved in threee days send me a MyChart message OR with any new or worsening symptoms.

## 2019-12-11 NOTE — Progress Notes (Signed)
Assessment and Plan:  Gail Bailey was seen today for headache.  Diagnoses and all orders for this visit:  Encounter for screening for COVID-19 -     POC COVID-19 BinaxNow - Negative  Acute non-recurrent pansinusitis Norel-Ad every 4-6 hours. Continue Neti Pot BID Continue fluid intake 64-80 oz of water a day.  Non-recurrent acute serous otitis media of both ears Zyrtec nightly    Call or return with any new or worsening symptoms   Further disposition pending results of labs. Discussed med's effects and SE's.   Over 30 minutes of face to face interview, exam, counseling, chart review, and critical decision making was performed.   Future Appointments  Date Time Provider [place]  12/20/2019  3:00 PM Unk Pinto, MD [facility] None    ------------------------------------------------------------------------------------------------------------------   HPI 59 y.o.female presents for evaluation for ear popping and throat drainage that started about five days ago.  She tried some aleve for a headache and also 800mg  of ibuprofen.  She has tried her neti pot and taken some Mucinex.  She is having some facial tenderness with positional change.    Past Medical History:  Diagnosis Date  . Allergy   . Anxiety   . Asthma   . Cardiomyopathy ([place])    a. Echo 8/16:  EF 40-45%, apical and ant-septal HK, Gr 2 DD  . CHF (congestive heart failure) ([place])   . Diverticula, colon   . Family history of adverse reaction to anesthesia    sisiter had PONV  . Headache    rare migraines  . History of cardiovascular stress test    Myoview 8/16:  EF 48%, anterior and apical defect c/w breast atten, no ischemia; Low Risk // Nuclear stress test 9/18: Low risk stress nuclear study with fixed septal defect consistent with LBBB vs prior infarct; no ischemia; EF 53 with septal akinesis.  . Hyperlipidemia   . Hypertension   . Irregular periods/menstrual cycles   . LBBB (left bundle branch block)    . Melanoma ([place])    right arm  . Seasonal allergies   . Stroke [facility])    TIA august 2016  . TIA (transient ischemic attack)   . Torn meniscus      Allergies  Allergen Reactions  . Oxycodone Itching    Itching: Trunk, palms of hands, legs, back      Current Outpatient Medications on File Prior to Visit  Medication Sig  . acyclovir ointment (ZOVIRAX) 5 % Apply 1 application topically every 3 (three) hours. (Patient taking differently: Apply 1 application topically as needed. )  . Adapalene-Benzoyl Peroxide (EPIDUO) 0.1-2.5 % gel Apply 1 application topically as needed (acne).  Marland Kitchen albuterol (VENTOLIN HFA) 108 (90 Base) MCG/ACT inhaler INHALE 2 PUFFS INTO LUNGS EVERY 4 HOURS AS NEEDED FOR WHEEZING OR SHORTNESS OF BREATH  . amLODipine (NORVASC) 10 MG tablet Take 1 tablet Daily for BP  . aspirin EC 81 MG tablet Take 1 tablet (81 mg total) by mouth daily.  Marland Kitchen atorvastatin (LIPITOR) 20 MG tablet Take 1 tab 5 days a week or as directed for cholesterol.  . Cholecalciferol (VITAMIN D PO) Take 5,000 Units by mouth daily.   Marland Kitchen escitalopram (LEXAPRO) 10 MG tablet TAKE 1 TABLET DAILY FOR MOOD & ANXIETY  . estradiol (ESTRACE) 0.5 MG tablet Take 0.5 mg by mouth daily.  . fluticasone (FLONASE) 50 MCG/ACT nasal spray SPRAY 2 SPRAYS INTO EACH NOSTRIL EVERY DAY  . furosemide (LASIX) 40 MG tablet TAKE 1 TABLET EVERY DAY FOR  BLOOD PRESSURE AND FLUID  . ibuprofen (ADVIL) 800 MG tablet TAKE 1 TABLET 3 TIMES A DAY WITH FOOD IF NEEDED FOR PAIN & INFLAMMATION / CRAMPING  . medroxyPROGESTERone (PROVERA) 2.5 MG tablet Take 2.5 mg by mouth daily.  . metoprolol succinate (TOPROL-XL) 100 MG 24 hr tablet TAKE 1/2 TO 1 TABLET DAILY  . montelukast (SINGULAIR) 10 MG tablet TAKE 1 TABLET BY MOUTH DAILY FOR ALLERGIES  . olmesartan (BENICAR) 40 MG tablet TAKE 1/2 TABLET BY MOUTH EVERY DAY IF BLOOD PRESSURE 140/90, TAKE 1 FULL TALBET  . Omega-3 Fatty Acids (FISH OIL PO) Take 1 capsule by mouth daily.  . valACYclovir  (VALTREX) 500 MG tablet Take 500 mg by mouth 2 (two) times daily as needed (fever blister outbreak).   No current facility-administered medications on file prior to visit.    ROS: all negative except above.   Physical Exam:  BP 128/70   Pulse (!) 56   Temp 98.1 F (36.7 C)   Ht 5\' 1"  (1.549 m)   Wt 149 lb (67.6 kg)   SpO2 97%   BMI 28.15 kg/m   General Appearance: Well nourished, in no apparent distress. Eyes: PERRLA, EOMs, conjunctiva no swelling or erythema Sinuses: Frontal/maxillary tenderness ENT/Mouth: Ext aud canals clear, TMs without erythema, bulging, serus noted behind bilateral TM's.. No erythema, swelling, or exudate on post pharynx.  Tonsils not swollen or erythematous. Hearing normal.  Neck: Supple, thyroid normal.  Respiratory: Respiratory effort normal, BS equal bilaterally without rales, rhonchi, wheezing or stridor.  Cardio: RRR with no MRGs. Brisk peripheral pulses without edema.  Abdomen: Soft, + BS.  Non tender, no guarding, rebound, hernias, masses. Lymphatics: Non tender without lymphadenopathy.  Musculoskeletal: Full ROM, 5/5 strength, normal gait.  Skin: Warm, dry without rashes, lesions, ecchymosis.  Neuro: Cranial nerves intact. Normal muscle tone, no cerebellar symptoms. Sensation intact.  Psych: Awake and oriented X 3, normal affect, Insight and Judgment appropriate.    Garnet Sierras, Laqueta Jean, DNP [facility] 12/11/2019  3:29 PM

## 2019-12-13 DIAGNOSIS — M9901 Segmental and somatic dysfunction of cervical region: Secondary | ICD-10-CM | POA: Diagnosis not present

## 2019-12-14 DIAGNOSIS — M9901 Segmental and somatic dysfunction of cervical region: Secondary | ICD-10-CM | POA: Diagnosis not present

## 2019-12-20 ENCOUNTER — Other Ambulatory Visit: Payer: Self-pay

## 2019-12-20 ENCOUNTER — Ambulatory Visit: Payer: BC Managed Care – PPO | Admitting: Internal Medicine

## 2019-12-20 ENCOUNTER — Encounter: Payer: Self-pay | Admitting: Internal Medicine

## 2019-12-20 VITALS — BP 104/76 | HR 56 | Temp 97.7°F | Resp 16 | Ht 60.5 in | Wt 137.2 lb

## 2019-12-20 DIAGNOSIS — Z1211 Encounter for screening for malignant neoplasm of colon: Secondary | ICD-10-CM

## 2019-12-20 DIAGNOSIS — Z8249 Family history of ischemic heart disease and other diseases of the circulatory system: Secondary | ICD-10-CM

## 2019-12-20 DIAGNOSIS — Z23 Encounter for immunization: Secondary | ICD-10-CM

## 2019-12-20 DIAGNOSIS — Z0000 Encounter for general adult medical examination without abnormal findings: Secondary | ICD-10-CM

## 2019-12-20 DIAGNOSIS — Z131 Encounter for screening for diabetes mellitus: Secondary | ICD-10-CM

## 2019-12-20 DIAGNOSIS — Z13220 Encounter for screening for lipoid disorders: Secondary | ICD-10-CM | POA: Diagnosis not present

## 2019-12-20 DIAGNOSIS — Z111 Encounter for screening for respiratory tuberculosis: Secondary | ICD-10-CM

## 2019-12-20 DIAGNOSIS — I10 Essential (primary) hypertension: Secondary | ICD-10-CM | POA: Diagnosis not present

## 2019-12-20 DIAGNOSIS — Z1389 Encounter for screening for other disorder: Secondary | ICD-10-CM | POA: Diagnosis not present

## 2019-12-20 DIAGNOSIS — G4730 Sleep apnea, unspecified: Secondary | ICD-10-CM

## 2019-12-20 DIAGNOSIS — Z79899 Other long term (current) drug therapy: Secondary | ICD-10-CM

## 2019-12-20 DIAGNOSIS — E782 Mixed hyperlipidemia: Secondary | ICD-10-CM

## 2019-12-20 DIAGNOSIS — E559 Vitamin D deficiency, unspecified: Secondary | ICD-10-CM

## 2019-12-20 DIAGNOSIS — R7309 Other abnormal glucose: Secondary | ICD-10-CM

## 2019-12-20 DIAGNOSIS — Z8673 Personal history of transient ischemic attack (TIA), and cerebral infarction without residual deficits: Secondary | ICD-10-CM

## 2019-12-20 DIAGNOSIS — Z1329 Encounter for screening for other suspected endocrine disorder: Secondary | ICD-10-CM

## 2019-12-20 DIAGNOSIS — Z136 Encounter for screening for cardiovascular disorders: Secondary | ICD-10-CM | POA: Diagnosis not present

## 2019-12-20 DIAGNOSIS — Z130 Encounter for screening for diseases of the blood and blood-forming organs and certain disorders involving the immune mechanism: Secondary | ICD-10-CM | POA: Diagnosis not present

## 2019-12-20 DIAGNOSIS — Z0001 Encounter for general adult medical examination with abnormal findings: Secondary | ICD-10-CM

## 2019-12-20 DIAGNOSIS — R5383 Other fatigue: Secondary | ICD-10-CM

## 2019-12-20 NOTE — Patient Instructions (Signed)

## 2019-12-20 NOTE — Progress Notes (Signed)
Annual Screening/Preventative Visit & Comprehensive Evaluation &  Examination      This very nice 59 y.o.  MWF presents for a Screening /Preventative Visit & comprehensive evaluation and management of multiple medical co-morbidities.  Patient has been followed for HTN, HLD, Prediabetes  and Vitamin D Deficiency.  Patient had a melanoma excised from her Rt arm in Sept 2019.       HTN predates circa 2014.  In 2016, she was suspected to have had a TIA. In Feb 2017, she had a Normal Cardiac MRI per Dr Cathie Olden. In Sept 2018, she had a negative Myoview. Patient's BP has been controlled at home and patient denies any cardiac symptoms as chest pain, palpitations, shortness of breath, dizziness or ankle swelling. Today's BP is at goal -  104/76.       Patient's hyperlipidemia is controlled with diet and Atorvastatin. Patient denies myalgias or other medication SE's. Last lipids were not at goal with elevated LDL (114) and also elevated Trig's:  Lab Results  Component Value Date   CHOL 198 07/24/2019   HDL 34 (L) 07/24/2019   LDLCALC 114 (H) 07/24/2019   TRIG 349 (H) 07/24/2019   CHOLHDL 5.8 (H) 07/24/2019       Patient has hx/o prediabetes (A1c 6.0% /2012) and patient denies reactive hypoglycemic symptoms, visual blurring, diabetic polys or paresthesias. Last A1c was not at goal:  Lab Results  Component Value Date   HGBA1C 5.8 (H) 07/24/2019       Finally, patient has history of Vitamin D Deficiency ("48" on tx/2008)and last Vitamin D was at goal:  Lab Results  Component Value Date   VD25OH 72 06/15/2018    Current Outpatient Medications on File Prior to Visit  Medication Sig  . acyclovir ointment  5 % Apply  topically as needed  . Adapalene-Benzoyl Peroxide (EPIDUO) 0.1-2.5 % gel Apply 1 application topically as needed  . albuterol HFA  inhaler 2 PUFFS EVERY 4 HOURS AS NEEDED   . amLODipine  10 MG Take 1 tablet Daily for BP  . aspirin EC 81 MG  Take 1 tablet  daily.  Marland Kitchen  atorvastatin  20 MG  Take 1 tab 5 days a week or as directed for cholesterol.  Marland Kitchen VITAMIN D  5,000 Units  Take daily.   Marland Kitchen escitalopram  10 MG  TAKE 1 TABLET DAILY   . estradiol 0.5 MG Take aily.  Marland Kitchen FLONASE nasal spray SPRAY 2 SPRAYS INTO EACH NOSTRIL EVERY DAY  . furosemide  40 MG tablet TAKE 1 TABLET EVERY DAY  . Ibuprofen 800 MG tablet TAKE 1 TABLET 3 TIMES A DAY  IF NEEDED  . PROVERA 2.5 MG tablet Take  daily.  . metoprolol succ-XL) 100 MG  TAKE 1/2 TO 1 TABLET DAILY  . montelukast  10 MG tablet TAKE 1 TABLET DAILY FOR ALLERGIES  . olmesartan 40 MG tablet TAKE 1 TABLET  Daily  . Omega-3 FISH OIL  Take 1 capsule  daily.  Marland Kitchen topiramate50 MG tablet Take  2 times daily  . valACYclovir  500 MG tablet Take  2 times daily as needed     Allergies  Allergen Reactions  . Oxycodone Itching    Itching: Trunk, palms of hands, legs, back      Past Medical History:  Diagnosis Date  . Allergy   . Anxiety   . Asthma   . Cardiomyopathy ([place])    a. Echo 8/16:  EF 40-45%, apical and ant-septal HK, Gr  2 DD  . CHF (congestive heart failure) ([place])   . Diverticula, colon   . Family history of adverse reaction to anesthesia    sisiter had PONV  . Headache    rare migraines  . History of cardiovascular stress test    Myoview 8/16:  EF 48%, anterior and apical defect c/w breast atten, no ischemia; Low Risk // Nuclear stress test 9/18: Low risk stress nuclear study with fixed septal defect consistent with LBBB vs prior infarct; no ischemia; EF 53 with septal akinesis.  . Hyperlipidemia   . Hypertension   . Irregular periods/menstrual cycles   . LBBB (left bundle branch block)   . Melanoma ([place])    right arm  . Seasonal allergies   . Stroke [facility])    TIA august 2016  . TIA (transient ischemic attack)   . Torn meniscus    Health Maintenance  Topic Date Due  . COVID-19 Vaccine (1) Never done  . PAP SMEAR-Modifier  05/19/2019  . INFLUENZA VACCINE  08/13/2019  . MAMMOGRAM  07/03/2021  .  TETANUS/TDAP  01/08/2025  . COLONOSCOPY  07/13/2027  . Hepatitis C Screening  Completed  . HIV Screening  Completed   Immunization History  Administered Date(s) Administered  . Influenza Inj Mdck Quad Pf 10/06/2018, 10/06/2018  . Influenza Inj Mdck Quad With Preservative 10/13/2016, 11/05/2017  . Influenza Split 10/24/2013  . Influenza,inj,quad, With Preservative 12/24/2015  . Influenza-Unspecified 09/12/2013, 10/13/2015  . PPD Test 06/13/2013, 09/13/2015, 10/13/2016, 11/05/2017  . Pneumococcal Polysaccharide-23 02/12/2004  . Td 10/12/2004  . Tdap 01/09/2015    Last Colon - 07/12/2017 - Dr Havery Moros - Hyperplastic polyp - Recc 10 yr f/u due July 2029.  Last MGM - 06/24/2019  Past Surgical History:  Procedure Laterality Date  . ABLATION    . BREAST REDUCTION SURGERY    . CARDIAC CATHETERIZATION    . CESAREAN SECTION     x2  . KNEE SURGERY     partial knee replacement right  . MELANOMA EXCISION WITH SENTINEL LYMPH NODE BIOPSY Right 09/15/2017   Procedure: BIOPSY RIGHT SHOULDER MOLE AND WIDE LOCAL EXCISION OF RIGHT ARM MELANOMA, ADVANCEMENT FLAP CLOSURE, SENTINEL LYMPH NODE BIOPSY  RIGHT ARM MELANOMA;  Surgeon: Stark Klein, MD;  Location: [facility];  Service: General;  Laterality: Right;  . REDUCTION MAMMAPLASTY Bilateral   . REPAIR QUADRICEPS / HAMSTRING MUSCLE    . REPLACEMENT TOTAL KNEE Left 09/20/2018   Dr. Okey Dupre at [facility]  . TONSILLECTOMY    . tubes in ears  10/12   Family History  Problem Relation Age of Onset  . Diabetes Mother   . Heart disease Mother   . Hypertension Mother   . Heart attack Mother 78  . Other Father        MVA  . Asthma Sister   . Asthma Son   . Cancer Maternal Grandmother        Uterine  . Breast cancer Maternal Grandmother   . Heart attack Paternal Grandmother   . Colon cancer Neg Hx   . Colon polyps Neg Hx   . Esophageal cancer Neg Hx   . Stomach cancer Neg Hx   . Rectal cancer Neg Hx    Social History   Tobacco  Use  . Smoking status: Never Smoker  . Smokeless tobacco: Never Used  Vaping Use  . Vaping Use: Never used  Substance Use Topics  . Alcohol use: Yes    Comment: 1-2 glasses wine nightly  .  Drug use: No    ROS Constitutional: Denies fever, chills, weight loss/gain, headaches, insomnia,  night sweats, and change in appetite. Does c/o fatigue. Eyes: Denies redness, blurred vision, diplopia, discharge, itchy, watery eyes.  ENT: Denies discharge, congestion, post nasal drip, epistaxis, sore throat, earache, hearing loss, dental pain, Tinnitus, Vertigo, Sinus pain, snoring.  Cardio: Denies chest pain, palpitations, irregular heartbeat, syncope, dyspnea, diaphoresis, orthopnea, PND, claudication, edema Respiratory: denies cough, dyspnea, DOE, pleurisy, hoarseness, laryngitis, wheezing.  Gastrointestinal: Denies dysphagia, heartburn, reflux, water brash, pain, cramps, nausea, vomiting, bloating, diarrhea, constipation, hematemesis, melena, hematochezia, jaundice, hemorrhoids Genitourinary: Denies dysuria, frequency, urgency, nocturia, hesitancy, discharge, hematuria, flank pain Breast: Breast lumps, nipple discharge, bleeding.  Musculoskeletal: Denies arthralgia, myalgia, stiffness, Jt. Swelling, pain, limp, and strain/sprain. Denies falls. Skin: Denies puritis, rash, hives, warts, acne, eczema, changing in skin lesion Neuro: No weakness, tremor, incoordination, spasms, paresthesia, pain Psychiatric: Denies confusion, memory loss, sensory loss. Denies Depression. Endocrine: Denies change in weight, skin, hair change, nocturia, and paresthesia, diabetic polys, visual blurring, hyper / hypo glycemic episodes.  Heme/Lymph: No excessive bleeding, bruising, enlarged lymph nodes.  Physical Exam  BP 104/76   Pulse (!) 56   Temp 97.7 F (36.5 C)   Resp 16   Ht 5' 0.5" (1.537 m)   Wt 137 lb 3.2 oz (62.2 kg)   SpO2 98%   BMI 26.35 kg/m   General Appearance: Well nourished, well groomed and in no  apparent distress.  Eyes: PERRLA, EOMs, conjunctiva no swelling or erythema, normal fundi and vessels. Sinuses: No frontal/maxillary tenderness ENT/Mouth: EACs patent / TMs  nl. Nares clear without erythema, swelling, mucoid exudates. Oral hygiene is good. No erythema, swelling, or exudate. Tongue normal, non-obstructing. Tonsils not swollen or erythematous. Hearing normal.  Neck: Supple, thyroid not palpable. No bruits, nodes or JVD. Respiratory: Respiratory effort normal.  BS equal and clear bilateral without rales, rhonci, wheezing or stridor. Cardio: Heart sounds are normal with regular rate and rhythm and no murmurs, rubs or gallops. Peripheral pulses are normal and equal bilaterally without edema. No aortic or femoral bruits. Chest: symmetric with normal excursions and percussion. Breasts: Deferred to Gyn Abdomen: Flat, soft with bowel sounds active. Nontender, no guarding, rebound, hernias, masses, or organomegaly.  Lymphatics: Non tender without lymphadenopathy.  Musculoskeletal: Full ROM all peripheral extremities, joint stability, 5/5 strength, and normal gait. Skin: Warm and dry without rashes, lesions, cyanosis, clubbing or  ecchymosis.  Neuro: Cranial nerves intact, reflexes equal bilaterally. Normal muscle tone, no cerebellar symptoms. Sensation intact.  Pysch: Alert and oriented x 3, normal affect, Insight and Judgment appropriate.   Assessment and Plan  1. Annual Preventative Screening Examination     2. Essential hypertension  - EKG 12-Lead - [place], RETROPERITNL ABD,  LTD - Urinalysis, Routine w reflex microscopic - Microalbumin / creatinine urine ratio - CBC with Differential/Platelet - COMPLETE METABOLIC PANEL WITH GFR - Magnesium - TSH  3. Hyperlipidemia, mixed  - EKG 12-Lead - [place], RETROPERITNL ABD,  LTD - Lipid panel - TSH  4. Abnormal glucose  - EKG 12-Lead - [place], RETROPERITNL ABD,  LTD - Hemoglobin A1c - Insulin, random  5. Vitamin D deficiency  -  VITAMIN D 25 Hydroxy   6. Mild sleep apnea (recommended oral device)   7. Screening for colorectal cancer  - POC Hemoccult Bld/Stl   8. Screening for ischemic heart disease  - EKG 12-Lead  9. FHx: heart disease  - EKG 12-Lead - [place], RETROPERITNL ABD,  LTD  10. Screening  for AAA   - [place], RETROPERITNL ABD,  LTD  11. Fatigue, unspecified type  - Iron,Total/Total Iron Binding Cap - Vitamin B12 - CBC with Differential/Platelet - TSH  12. Medication management  - Urinalysis, Routine w reflex microscopic - Microalbumin / creatinine urine ratio - CBC with Differential/Platelet - COMPLETE METABOLIC PANEL WITH GFR - Magnesium - Lipid panel - TSH - Hemoglobin A1c - Insulin, random - VITAMIN D 25 Hydroxy  13. Screening-pulmonary TB  - TB Skin Test  14. History of TIA (transient ischemic attack)         Patient was counseled in prudent diet to achieve/maintain BMI less than 25 for weight control, BP monitoring, regular exercise and medications. Discussed med's effects and SE's. Screening labs and tests as requested with regular follow-up as recommended. Over 40 minutes of exam, counseling, chart review and high complex critical decision making was performed.   Kirtland Bouchard, MD

## 2019-12-21 LAB — CBC WITH DIFFERENTIAL/PLATELET
Absolute Monocytes: 563 cells/uL (ref 200–950)
Basophils Absolute: 67 cells/uL (ref 0–200)
Basophils Relative: 0.8 %
Eosinophils Absolute: 143 cells/uL (ref 15–500)
Eosinophils Relative: 1.7 %
HCT: 44.3 % (ref 35.0–45.0)
Hemoglobin: 14.7 g/dL (ref 11.7–15.5)
Lymphs Abs: 2671 cells/uL (ref 850–3900)
MCH: 29.3 pg (ref 27.0–33.0)
MCHC: 33.2 g/dL (ref 32.0–36.0)
MCV: 88.4 fL (ref 80.0–100.0)
MPV: 13.5 fL — ABNORMAL HIGH (ref 7.5–12.5)
Monocytes Relative: 6.7 %
Neutro Abs: 4956 cells/uL (ref 1500–7800)
Neutrophils Relative %: 59 %
Platelets: 377 10*3/uL (ref 140–400)
RBC: 5.01 10*6/uL (ref 3.80–5.10)
RDW: 13.8 % (ref 11.0–15.0)
Total Lymphocyte: 31.8 %
WBC: 8.4 10*3/uL (ref 3.8–10.8)

## 2019-12-21 LAB — COMPLETE METABOLIC PANEL WITH GFR
AG Ratio: 1.7 (calc) (ref 1.0–2.5)
ALT: 25 U/L (ref 6–29)
AST: 20 U/L (ref 10–35)
Albumin: 4.2 g/dL (ref 3.6–5.1)
Alkaline phosphatase (APISO): 82 U/L (ref 37–153)
BUN: 16 mg/dL (ref 7–25)
CO2: 28 mmol/L (ref 20–32)
Calcium: 9.6 mg/dL (ref 8.6–10.4)
Chloride: 104 mmol/L (ref 98–110)
Creat: 0.91 mg/dL (ref 0.50–1.05)
GFR, Est African American: 81 mL/min/{1.73_m2} (ref >=60)
GFR, Est Non African American: 70 mL/min/{1.73_m2} (ref >=60)
Globulin: 2.5 g/dL (calc) (ref 1.9–3.7)
Glucose, Bld: 108 mg/dL — ABNORMAL HIGH (ref 65–99)
Potassium: 3.7 mmol/L (ref 3.5–5.3)
Sodium: 139 mmol/L (ref 135–146)
Total Bilirubin: 0.4 mg/dL (ref 0.2–1.2)
Total Protein: 6.7 g/dL (ref 6.1–8.1)

## 2019-12-21 LAB — HEMOGLOBIN A1C
Hgb A1c MFr Bld: 6.1 % of total Hgb — ABNORMAL HIGH (ref <5.7)
Mean Plasma Glucose: 128 mg/dL
eAG (mmol/L): 7.1 mmol/L

## 2019-12-21 LAB — URINALYSIS, ROUTINE W REFLEX MICROSCOPIC
Bacteria, UA: NONE SEEN /HPF
Bilirubin Urine: NEGATIVE
Glucose, UA: NEGATIVE
Hgb urine dipstick: NEGATIVE
Hyaline Cast: NONE SEEN /LPF
Ketones, ur: NEGATIVE
Nitrite: NEGATIVE
Protein, ur: NEGATIVE
RBC / HPF: NONE SEEN /HPF (ref 0–2)
Specific Gravity, Urine: 1.011 (ref 1.001–1.03)
Squamous Epithelial / HPF: NONE SEEN /HPF (ref <=5)
pH: 6.5 (ref 5.0–8.0)

## 2019-12-21 LAB — LIPID PANEL
Cholesterol: 153 mg/dL (ref <200)
HDL: 35 mg/dL — ABNORMAL LOW (ref >=50)
LDL Cholesterol (Calc): 85 mg/dL (calc)
Non-HDL Cholesterol (Calc): 118 mg/dL (calc) (ref <130)
Total CHOL/HDL Ratio: 4.4 (calc) (ref <5.0)
Triglycerides: 240 mg/dL — ABNORMAL HIGH (ref <150)

## 2019-12-21 LAB — VITAMIN B12: Vitamin B-12: 1303 pg/mL — ABNORMAL HIGH (ref 200–1100)

## 2019-12-21 LAB — VITAMIN D 25 HYDROXY (VIT D DEFICIENCY, FRACTURES): Vit D, 25-Hydroxy: 101 ng/mL — ABNORMAL HIGH (ref 30–100)

## 2019-12-21 LAB — IRON, TOTAL/TOTAL IRON BINDING CAP
%SAT: 28 % (calc) (ref 16–45)
Iron: 86 ug/dL (ref 45–160)
TIBC: 311 mcg/dL (calc) (ref 250–450)

## 2019-12-21 LAB — INSULIN, RANDOM: Insulin: 39 u[IU]/mL — ABNORMAL HIGH

## 2019-12-21 LAB — TSH: TSH: 1.15 mIU/L (ref 0.40–4.50)

## 2019-12-21 LAB — MICROALBUMIN / CREATININE URINE RATIO
Creatinine, Urine: 61 mg/dL (ref 20–275)
Microalb Creat Ratio: 3 mcg/mg creat (ref <30)
Microalb, Ur: 0.2 mg/dL

## 2019-12-21 LAB — MAGNESIUM: Magnesium: 2.1 mg/dL (ref 1.5–2.5)

## 2019-12-21 NOTE — Progress Notes (Signed)
========================================================== -   Test results slightly outside the reference range are not unusual. If there is anything important, I will review this with you,  otherwise it is considered normal test values.  If you have further questions,  please do not hesitate to contact me at the office or via My Chart.  ==========================================================  -  Iron & Vitamin B12 levels - Both OK  ==========================================================  -  Total Chol = 153 and LDL Chol = 85 - Both  Excellent   - Very low risk for Heart Attack  / Stroke ======================================================== ==========================================================  -  But, Triglycerides (   240   ) or fats in blood are too high  (goal is less than 150)    - Recommend avoid fried & greasy foods,  sweets / candy,   - Avoid white rice  (brown or wild rice or Quinoa is OK),   - Avoid white potatoes  (sweet potatoes are OK)   - Avoid anything made from white flour  - bagels, doughnuts, rolls, buns, biscuits, white and   wheat breads, pizza crust and traditional  pasta made of white flour & egg white  - (vegetarian pasta or spinach or wheat pasta is OK).    - Multi-grain bread is OK - like multi-grain flat bread or  sandwich thins.   - Avoid alcohol in excess.   - Exercise is also important. ==========================================================  -  A1c = 6.1% -  an average glucose of 128 mg%   (Ideal  or Goal A1c is less than 5.7%)  ==========================================================  -  Vitamin D = 101 - Excellent  ==========================================================  -  All Else - CBC - Kidneys - Electrolytes - Liver - Magnesium & Thyroid    - all  Normal / OK ==========================================================

## 2019-12-25 LAB — TB SKIN TEST
Induration: 0 mm
TB Skin Test: NEGATIVE

## 2019-12-26 ENCOUNTER — Other Ambulatory Visit: Payer: Self-pay | Admitting: Internal Medicine

## 2019-12-26 DIAGNOSIS — I10 Essential (primary) hypertension: Secondary | ICD-10-CM

## 2020-01-04 DIAGNOSIS — Z03818 Encounter for observation for suspected exposure to other biological agents ruled out: Secondary | ICD-10-CM | POA: Diagnosis not present

## 2020-01-04 DIAGNOSIS — Z20822 Contact with and (suspected) exposure to covid-19: Secondary | ICD-10-CM | POA: Diagnosis not present

## 2020-01-09 DIAGNOSIS — M9901 Segmental and somatic dysfunction of cervical region: Secondary | ICD-10-CM | POA: Diagnosis not present

## 2020-01-17 ENCOUNTER — Encounter: Payer: BC Managed Care – PPO | Admitting: Internal Medicine

## 2020-01-27 IMAGING — CR DG CHEST 2V
2 series · 2 of 2 positions shown · non-contrast
Comparison: August 18, 2014

CLINICAL DATA: Pre-admit for melanoma on right arm.

EXAM:
CHEST - 2 VIEW

[w chest pa]
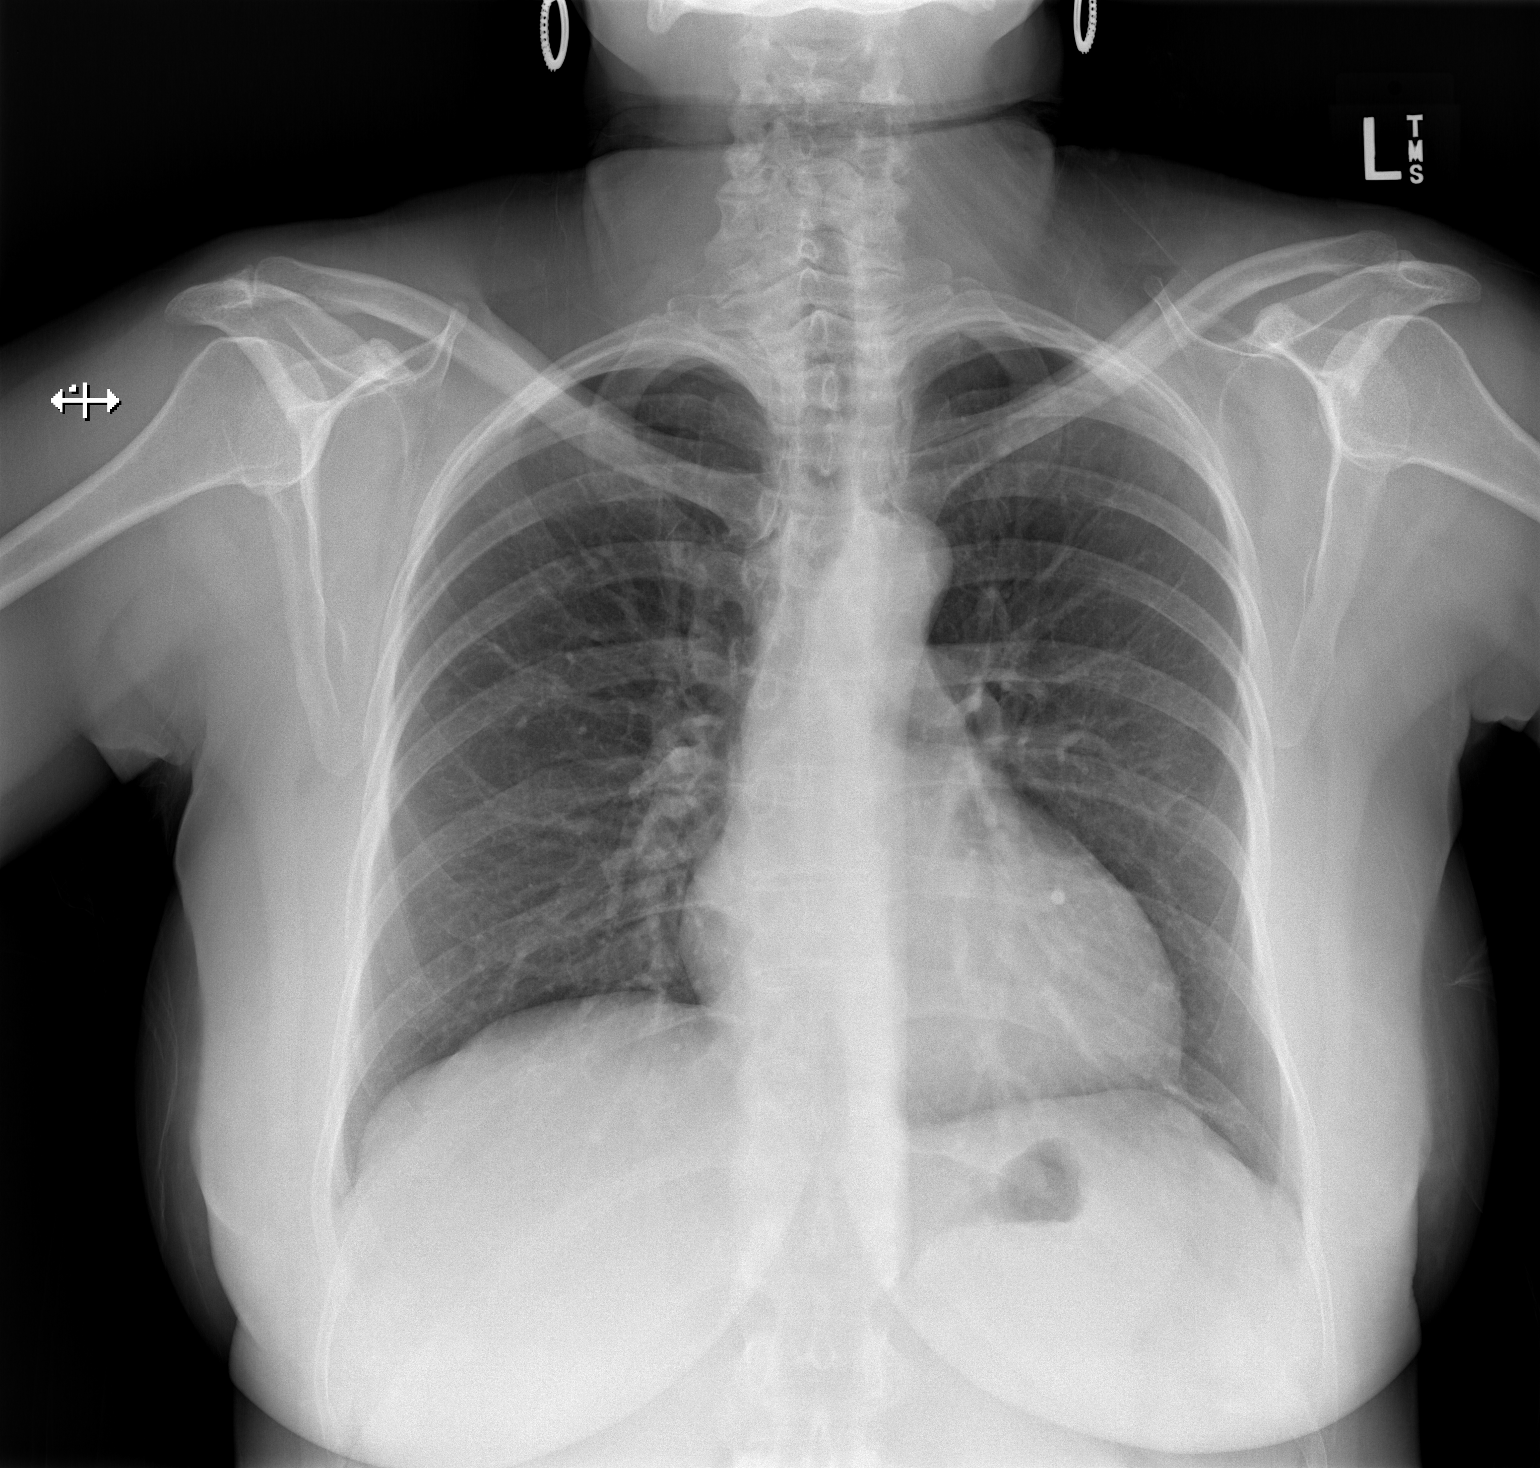

[w chest lat]
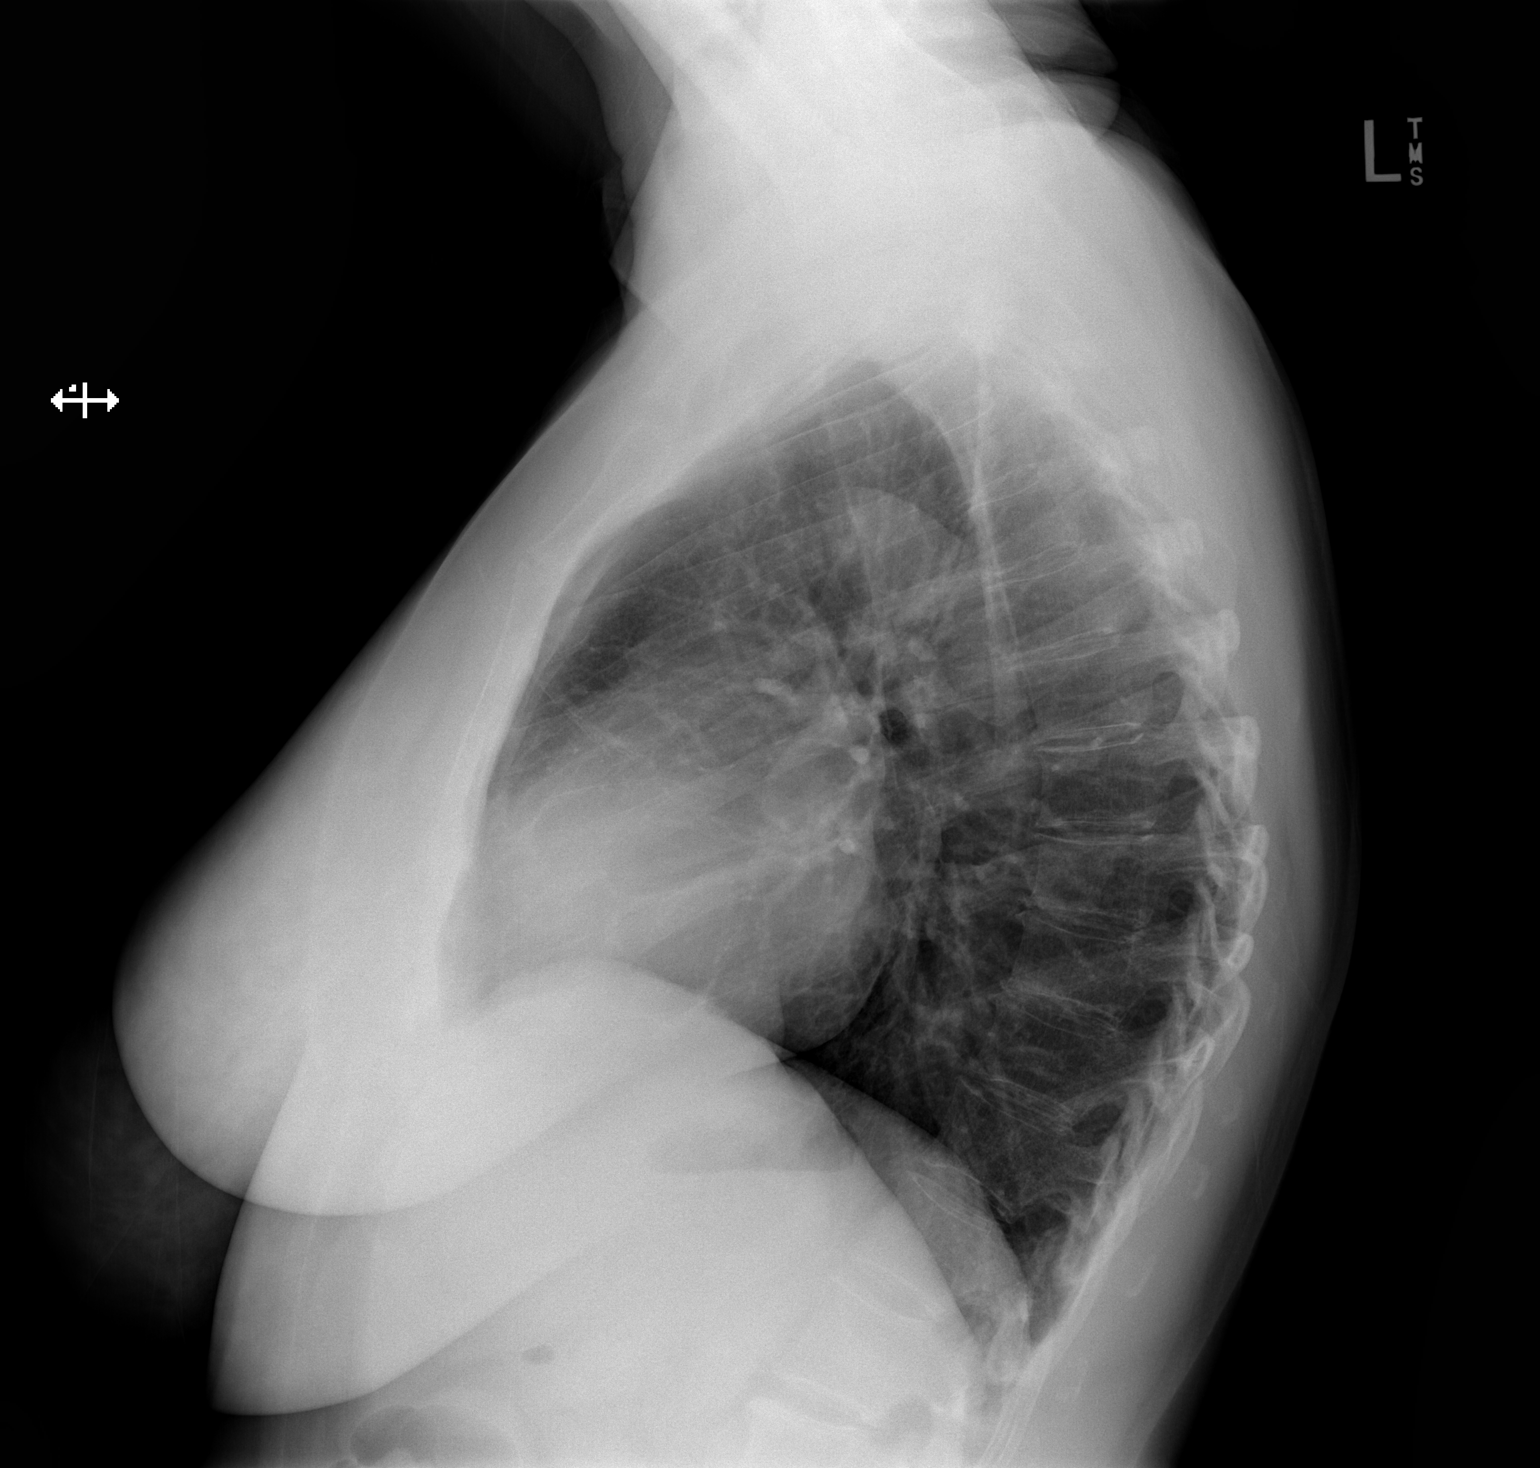

[2 of 2 positions shown; findings below may reference images not displayed]

FINDINGS: Heart, hila, and mediastinum are normal. Possible tiny granuloma in
the left apex. No other nodules, masses, or focal infiltrates. Mild
atelectasis in the left base. No other acute abnormalities.
IMPRESSION: No active cardiopulmonary disease.

## 2020-02-01 DIAGNOSIS — M9901 Segmental and somatic dysfunction of cervical region: Secondary | ICD-10-CM | POA: Diagnosis not present

## 2020-02-13 ENCOUNTER — Other Ambulatory Visit: Payer: Self-pay

## 2020-02-13 MED ORDER — ACYCLOVIR 5 % EX OINT
1.0000 | TOPICAL_OINTMENT | CUTANEOUS | 1 refills | Status: DC
Start: 2020-02-13 — End: 2023-09-15

## 2020-02-18 ENCOUNTER — Other Ambulatory Visit: Payer: Self-pay | Admitting: Internal Medicine

## 2020-03-02 ENCOUNTER — Other Ambulatory Visit: Payer: Self-pay | Admitting: Adult Health

## 2020-03-02 DIAGNOSIS — Z79899 Other long term (current) drug therapy: Secondary | ICD-10-CM

## 2020-03-07 ENCOUNTER — Encounter: Payer: BC Managed Care – PPO | Admitting: Internal Medicine

## 2020-03-11 DIAGNOSIS — M9901 Segmental and somatic dysfunction of cervical region: Secondary | ICD-10-CM | POA: Diagnosis not present

## 2020-03-13 DIAGNOSIS — H18593 Other hereditary corneal dystrophies, bilateral: Secondary | ICD-10-CM | POA: Diagnosis not present

## 2020-03-15 DIAGNOSIS — H18521 Epithelial (juvenile) corneal dystrophy, right eye: Secondary | ICD-10-CM | POA: Diagnosis not present

## 2020-03-20 NOTE — Progress Notes (Signed)
Assessment and Plan:  Gail Bailey was seen today for acute visit.  Diagnoses and all orders for this visit:  Mild intermittent asthma with exacerbation Laryngitis Start steroid taper; given breo sample inhaler with instructions to start while traveling; continue antihistamine and singulaire; refilled albuterol  Zpak to hold and start if progressive productive cough, new fever/chills while traveling; tessaon perles per her preference The patient was advised to call immediately if she has any concerning symptoms in the interval. The patient voices understanding of current treatment options and is in agreement with the current care plan. All questions were answered. The patient knows to call the clinic with any problems, questions or concerns or go to the ER if any acute progression of symptoms.  -     predniSONE (DELTASONE) 20 MG tablet; 3 tablets daily with food for 3 days, 2 tabs daily for 3 days, 1 tab a day for 5 days. -     benzonatate (TESSALON) 200 MG capsule; Take 1 perle 3 x/day to prevent cough -     azithromycin (ZITHROMAX) 250 MG tablet; Take 2 tablets (500 mg) on  Day 1,  followed by 1 tablet (250 mg) once daily on Days 2 through 5. -     albuterol (VENTOLIN HFA) 108 (90 Base) MCG/ACT inhaler; INHALE 2 PUFFS INTO LUNGS EVERY 4 HOURS AS NEEDED FOR WHEEZING OR SHORTNESS OF BREATH  Further disposition pending results of labs. Discussed med's effects and SE's.   Over 15 minutes of exam, counseling, chart review, and critical decision making was performed.   Future Appointments  Date Time Provider [place]  04/04/2020  3:30 PM Garnet Sierras, NP [facility] None  07/03/2020  9:30 AM Unk Pinto, MD [facility] None  01/02/2021 11:00 AM Unk Pinto, MD [facility] None    ------------------------------------------------------------------------------------------------------------------  HPI 60 y.o.female with hx of allergies/asthma presents for cough and hoarseness. She is  covid 19 vaccinated and reports negative home rapid screen today.   She reports sx began 4 days ago with rhinitis, sneezing, watery/itchy eyes, hx of severe allergies (year found but worst is spring, takes singulaire year round, started zyrtec), was having post nasal drip, throat irritation, started coughing from throat irritation, then developed sore throat, lost voice and feels settling into her chest. She reports developing typical asthmatic cough with sensation of spasming in chest, doesn't usually have wheezing, denies at this time. Some chest discomfort only with coughing fits. Denies fatigue, dyspnea or chest aching, fever/chills.   She has been taking singulaire and PRN albuterol, has added zyrtec, has started Flonase but causing nasal irritation. She has not been on daily inhaler.   She will be traveling tomorrow, will be out of state traveling for 10 days   Past Medical History:  Diagnosis Date  . Allergy   . Anxiety   . Asthma   . Cardiomyopathy ([place])    a. Echo 8/16:  EF 40-45%, apical and ant-septal HK, Gr 2 DD  . CHF (congestive heart failure) ([place])   . Diverticula, colon   . Family history of adverse reaction to anesthesia    sisiter had PONV  . Headache    rare migraines  . History of cardiovascular stress test    Myoview 8/16:  EF 48%, anterior and apical defect c/w breast atten, no ischemia; Low Risk // Nuclear stress test 9/18: Low risk stress nuclear study with fixed septal defect consistent with LBBB vs prior infarct; no ischemia; EF 53 with septal akinesis.  . Hyperlipidemia   .  Hypertension   . Irregular periods/menstrual cycles   . LBBB (left bundle branch block)   . Melanoma ([place])    right arm  . Seasonal allergies   . Stroke [facility])    TIA august 2016  . TIA (transient ischemic attack)   . Torn meniscus      Allergies  Allergen Reactions  . Oxycodone Itching    Itching: Trunk, palms of hands, legs, back      Current Outpatient Medications on File  Prior to Visit  Medication Sig  . acyclovir ointment (ZOVIRAX) 5 % Apply 1 application topically every 3 (three) hours.  . Adapalene-Benzoyl Peroxide (EPIDUO) 0.1-2.5 % gel Apply 1 application topically as needed (acne).  Marland Kitchen amLODipine (NORVASC) 10 MG tablet TAKE 1 TABLET DAILY FOR BLOOD PRESSURE  . aspirin EC 81 MG tablet Take 1 tablet (81 mg total) by mouth daily.  Marland Kitchen atorvastatin (LIPITOR) 20 MG tablet Take 1 tab 5 days a week or as directed for cholesterol.  . Cholecalciferol (VITAMIN D PO) Take 5,000 Units by mouth daily.   Marland Kitchen escitalopram (LEXAPRO) 10 MG tablet TAKE 1 TABLET DAILY FOR MOOD & ANXIETY  . estradiol (ESTRACE) 0.5 MG tablet Take 0.5 mg by mouth daily.  . fluticasone (FLONASE) 50 MCG/ACT nasal spray SPRAY 2 SPRAYS INTO EACH NOSTRIL EVERY DAY  . furosemide (LASIX) 40 MG tablet TAKE 1 TABLET EVERY DAY FOR BLOOD PRESSURE AND FLUID  . ibuprofen (ADVIL) 800 MG tablet TAKE 1 TABLET 3 TIMES A DAY WITH FOOD IF NEEDED FOR PAIN & INFLAMMATION / CRAMPING  . medroxyPROGESTERone (PROVERA) 2.5 MG tablet Take 2.5 mg by mouth daily.  . metoprolol succinate (TOPROL-XL) 100 MG 24 hr tablet TAKE 1/2 TO 1 TABLET DAILY  . montelukast (SINGULAIR) 10 MG tablet TAKE 1 TABLET BY MOUTH DAILY FOR ALLERGIES  . olmesartan (BENICAR) 40 MG tablet TAKE 1/2 TABLET BY MOUTH EVERY DAY IF BLOOD PRESSURE 140/90, TAKE 1 FULL TALBET  . Omega-3 Fatty Acids (FISH OIL PO) Take 1 capsule by mouth daily.  Marland Kitchen topiramate (TOPAMAX) 50 MG tablet TAKE 1/2-1 TABLET TWICE DAILY AT SUPPERTIME & BEDTIME FOR DIETING & WEIGHT LOSS  . valACYclovir (VALTREX) 500 MG tablet Take 500 mg by mouth 2 (two) times daily as needed (fever blister outbreak).   No current facility-administered medications on file prior to visit.    ROS: all negative except above.   Physical Exam:  BP 116/72   Pulse 61   Temp (!) 97.5 F (36.4 C)   Wt 136 lb (61.7 kg)   SpO2 97%   BMI 26.12 kg/m   General Appearance: Well nourished, in no apparent  distress. Eyes: PERRLA, conjunctiva no swelling or erythema Sinuses: No Frontal/maxillary tenderness ENT/Mouth: Ext aud canals clear, TMs without erythema, bulging. No erythema, swelling, or exudate on post pharynx.  Tonsils not swollen or erythematous. Hearing normal. Very horase vocal quality.  Neck: Supple, thyroid normal.  Respiratory: Respiratory effort normal, BS equal bilaterally without rales, rhonchi, wheezing or stridor. Frequent bronchitic coughing fits.  Cardio: RRR with no MRGs. Brisk peripheral pulses without edema.  Abdomen: Soft, + BS.  Non tender. Lymphatics: Non tender without lymphadenopathy.  Musculoskeletal: Chest non-tender;  normal gait.  Skin: Warm, dry without rashes, lesions, ecchymosis.  Neuro: Normal muscle tone Psych: Awake and oriented X 3, normal affect, Insight and Judgment appropriate.     Izora Ribas, NP 10:58 AM [facility]

## 2020-03-21 ENCOUNTER — Other Ambulatory Visit: Payer: Self-pay

## 2020-03-21 ENCOUNTER — Encounter: Payer: Self-pay | Admitting: Adult Health

## 2020-03-21 ENCOUNTER — Ambulatory Visit: Payer: BC Managed Care – PPO | Admitting: Adult Health

## 2020-03-21 VITALS — BP 116/72 | HR 61 | Temp 97.5°F | Wt 136.0 lb

## 2020-03-21 DIAGNOSIS — J040 Acute laryngitis: Secondary | ICD-10-CM | POA: Diagnosis not present

## 2020-03-21 DIAGNOSIS — J45909 Unspecified asthma, uncomplicated: Secondary | ICD-10-CM | POA: Diagnosis not present

## 2020-03-21 DIAGNOSIS — J4521 Mild intermittent asthma with (acute) exacerbation: Secondary | ICD-10-CM | POA: Diagnosis not present

## 2020-03-21 MED ORDER — BENZONATATE 200 MG PO CAPS: ORAL_CAPSULE | ORAL | 0 refills | Status: DC

## 2020-03-21 MED ORDER — AZITHROMYCIN 250 MG PO TABS: ORAL_TABLET | ORAL | 1 refills | Status: AC

## 2020-03-21 MED ORDER — PREDNISONE 20 MG PO TABS: ORAL_TABLET | ORAL | 0 refills | Status: AC

## 2020-03-21 MED ORDER — ALBUTEROL SULFATE HFA 108 (90 BASE) MCG/ACT IN AERS: INHALATION_SPRAY | RESPIRATORY_TRACT | 1 refills | Status: DC

## 2020-04-01 ENCOUNTER — Ambulatory Visit: Payer: BC Managed Care – PPO | Admitting: Adult Health Nurse Practitioner

## 2020-04-02 ENCOUNTER — Other Ambulatory Visit: Payer: Self-pay

## 2020-04-02 ENCOUNTER — Encounter: Payer: Self-pay | Admitting: Adult Health Nurse Practitioner

## 2020-04-02 ENCOUNTER — Ambulatory Visit: Payer: BC Managed Care – PPO | Admitting: Adult Health Nurse Practitioner

## 2020-04-02 ENCOUNTER — Ambulatory Visit
Admission: RE | Admit: 2020-04-02 | Discharge: 2020-04-02 | Disposition: A | Discharge status: Home / Self Care | Payer: BC Managed Care – PPO | Source: Ambulatory Visit | Attending: Adult Health Nurse Practitioner | Admitting: Adult Health Nurse Practitioner

## 2020-04-02 VITALS — BP 110/72 | HR 74 | Temp 97.3°F | Wt 138.6 lb

## 2020-04-02 DIAGNOSIS — E538 Deficiency of other specified B group vitamins: Secondary | ICD-10-CM | POA: Diagnosis not present

## 2020-04-02 DIAGNOSIS — R0602 Shortness of breath: Secondary | ICD-10-CM

## 2020-04-02 DIAGNOSIS — E782 Mixed hyperlipidemia: Secondary | ICD-10-CM | POA: Diagnosis not present

## 2020-04-02 DIAGNOSIS — R0609 Other forms of dyspnea: Secondary | ICD-10-CM | POA: Diagnosis not present

## 2020-04-02 DIAGNOSIS — R059 Cough, unspecified: Secondary | ICD-10-CM

## 2020-04-02 DIAGNOSIS — I10 Essential (primary) hypertension: Secondary | ICD-10-CM | POA: Diagnosis not present

## 2020-04-02 DIAGNOSIS — E559 Vitamin D deficiency, unspecified: Secondary | ICD-10-CM | POA: Diagnosis not present

## 2020-04-02 LAB — VITAMIN B12: Vitamin B-12: 645 pg/mL (ref 200–1100)

## 2020-04-02 LAB — COMPLETE METABOLIC PANEL WITH GFR
AG Ratio: 1.8 (calc) (ref 1.0–2.5)
ALT: 14 U/L (ref 6–29)
AST: 12 U/L (ref 10–35)
Albumin: 4 g/dL (ref 3.6–5.1)
Alkaline phosphatase (APISO): 73 U/L (ref 37–153)
BUN: 14 mg/dL (ref 7–25)
CO2: 27 mmol/L (ref 20–32)
Calcium: 8.6 mg/dL (ref 8.6–10.4)
Chloride: 104 mmol/L (ref 98–110)
Creat: 0.84 mg/dL (ref 0.50–1.05)
GFR, Est African American: 88 mL/min/{1.73_m2} (ref >=60)
GFR, Est Non African American: 76 mL/min/{1.73_m2} (ref >=60)
Globulin: 2.2 g/dL (calc) (ref 1.9–3.7)
Glucose, Bld: 132 mg/dL — ABNORMAL HIGH (ref 65–99)
Potassium: 3.5 mmol/L (ref 3.5–5.3)
Sodium: 139 mmol/L (ref 135–146)
Total Bilirubin: 0.3 mg/dL (ref 0.2–1.2)
Total Protein: 6.2 g/dL (ref 6.1–8.1)

## 2020-04-02 LAB — CBC WITH DIFFERENTIAL/PLATELET
Absolute Monocytes: 710 cells/uL (ref 200–950)
Basophils Absolute: 53 cells/uL (ref 0–200)
Basophils Relative: 0.5 %
Eosinophils Absolute: 170 cells/uL (ref 15–500)
Eosinophils Relative: 1.6 %
HCT: 43.7 % (ref 35.0–45.0)
Hemoglobin: 14.8 g/dL (ref 11.7–15.5)
Lymphs Abs: 2258 cells/uL (ref 850–3900)
MCH: 31 pg (ref 27.0–33.0)
MCHC: 33.9 g/dL (ref 32.0–36.0)
MCV: 91.4 fL (ref 80.0–100.0)
MPV: 12.2 fL (ref 7.5–12.5)
Monocytes Relative: 6.7 %
Neutro Abs: 7409 cells/uL (ref 1500–7800)
Neutrophils Relative %: 69.9 %
Platelets: 345 10*3/uL (ref 140–400)
RBC: 4.78 10*6/uL (ref 3.80–5.10)
RDW: 12.9 % (ref 11.0–15.0)
Total Lymphocyte: 21.3 %
WBC: 10.6 10*3/uL (ref 3.8–10.8)

## 2020-04-02 LAB — LIPID PANEL
Cholesterol: 183 mg/dL (ref <200)
HDL: 39 mg/dL — ABNORMAL LOW (ref >=50)
LDL Cholesterol (Calc): 107 mg/dL (calc) — ABNORMAL HIGH
Non-HDL Cholesterol (Calc): 144 mg/dL (calc) — ABNORMAL HIGH (ref <130)
Total CHOL/HDL Ratio: 4.7 (calc) (ref <5.0)
Triglycerides: 247 mg/dL — ABNORMAL HIGH (ref <150)

## 2020-04-02 LAB — TSH: TSH: 0.92 mIU/L (ref 0.40–4.50)

## 2020-04-02 LAB — VITAMIN D 25 HYDROXY (VIT D DEFICIENCY, FRACTURES): Vit D, 25-Hydroxy: 81 ng/mL (ref 30–100)

## 2020-04-02 NOTE — Progress Notes (Signed)
Assessment and Plan:  Gail Bailey was seen today for asthma and allergy sxs.  Diagnoses and all orders for this visit:  Exertional shortness of breath -     DG Chest 2 View; Future  Cough -     DG Chest 2 View; Future  Essential hypertension Continue current medications: olmesartan 40mg , half tablet, lasix 40mg , toporol-XL half tablet daily Monitor blood pressure at home; call if consistently over 130/80 Continue DASH diet.   Reminder to go to the ER if any CP, SOB, nausea, dizziness, severe HA, changes vision/speech, left arm numbness and tingling and jaw pain. -     CBC with Differential/Platelet -     COMPLETE METABOLIC PANEL WITH GFR -     TSH  Hyperlipidemia, mixed Continue medications: Atorvastatin 20mg , five days a week. Discussed dietary and exercise modifications Low fat diet -     Lipid panel  Vitamin D deficiency Continue supplementation to maintain goal of 70-100 Taking Vitamin D 5,000 IU daily -     VITAMIN D 25 Hydroxy (Vit-D Deficiency, Fractures)  B12 deficiency -     Vitamin B12   Discussed potential for stronger steroid taper, pending lab xray results.  Discussed multiple etiologies.  Discussed extended recovery from [place] as potential?  Patient to contact with any new or worsening symptoms.   Further disposition pending results of labs. Discussed med's effects and SE's.   Over 30 minutes of face to face interview, exam, counseling, chart review, and critical decision making was performed.   Future Appointments  Date Time Provider [place]  07/03/2020  9:30 AM Unk Pinto, MD [facility] None  01/02/2021 11:00 AM Unk Pinto, MD [facility] None    ------------------------------------------------------------------------------------------------------------------   HPI 60 y.o.female presents for evaluation of symptoms that started two weeks ago 03/21/20 has office visit for this.  She completed a course of prednisone and start antibiotics  and completed. She reports that her hoarseness is not resolving.  Sh VERY fatigued.  She had an episode shortness of breath, cough and oxygen level of 92%.  It did go back up.  Her cough is non productive.  She denies pain with deep breath but it is uncomfortable or full and not able to expand all the way.   She is using an inhaler, albuterol as needed for shortness of breath, three times a day.  She reports this helps some but not that effective.  She reports she feels like her lungs are just not filling up all the way.  Popping and cracking noted in bilateral ears, drainage in the back of her throat.  She reports   She reports she took COVID test 03/30/20 and also on 03/21/20.    Past Medical History:  Diagnosis Date  . Allergy   . Anxiety   . Asthma   . Cardiomyopathy ([place])    a. Echo 8/16:  EF 40-45%, apical and ant-septal HK, Gr 2 DD  . CHF (congestive heart failure) ([place])   . Diverticula, colon   . Family history of adverse reaction to anesthesia    sisiter had PONV  . Headache    rare migraines  . History of cardiovascular stress test    Myoview 8/16:  EF 48%, anterior and apical defect c/w breast atten, no ischemia; Low Risk // Nuclear stress test 9/18: Low risk stress nuclear study with fixed septal defect consistent with LBBB vs prior infarct; no ischemia; EF 53 with septal akinesis.  . Hyperlipidemia   . Hypertension   .  Irregular periods/menstrual cycles   . LBBB (left bundle branch block)   . Melanoma ([place])    right arm  . Seasonal allergies   . Stroke [facility])    TIA august 2016  . TIA (transient ischemic attack)   . Torn meniscus      Allergies  Allergen Reactions  . Oxycodone Itching    Itching: Trunk, palms of hands, legs, back      Current Outpatient Medications on File Prior to Visit  Medication Sig  . benzonatate (TESSALON) 200 MG capsule Take 1 perle 3 x/day to prevent cough  . acyclovir ointment (ZOVIRAX) 5 % Apply 1 application topically every 3  (three) hours.  . Adapalene-Benzoyl Peroxide (EPIDUO) 0.1-2.5 % gel Apply 1 application topically as needed (acne).  Marland Kitchen albuterol (VENTOLIN HFA) 108 (90 Base) MCG/ACT inhaler INHALE 2 PUFFS INTO LUNGS EVERY 4 HOURS AS NEEDED FOR WHEEZING OR SHORTNESS OF BREATH  . amLODipine (NORVASC) 10 MG tablet TAKE 1 TABLET DAILY FOR BLOOD PRESSURE  . aspirin EC 81 MG tablet Take 1 tablet (81 mg total) by mouth daily.  Marland Kitchen atorvastatin (LIPITOR) 20 MG tablet Take 1 tab 5 days a week or as directed for cholesterol.  . Cholecalciferol (VITAMIN D PO) Take 5,000 Units by mouth daily.   Marland Kitchen escitalopram (LEXAPRO) 10 MG tablet TAKE 1 TABLET DAILY FOR MOOD & ANXIETY  . estradiol (ESTRACE) 0.5 MG tablet Take 0.5 mg by mouth daily.  . fluticasone (FLONASE) 50 MCG/ACT nasal spray SPRAY 2 SPRAYS INTO EACH NOSTRIL EVERY DAY  . furosemide (LASIX) 40 MG tablet TAKE 1 TABLET EVERY DAY FOR BLOOD PRESSURE AND FLUID  . ibuprofen (ADVIL) 800 MG tablet TAKE 1 TABLET 3 TIMES A DAY WITH FOOD IF NEEDED FOR PAIN & INFLAMMATION / CRAMPING  . medroxyPROGESTERone (PROVERA) 2.5 MG tablet Take 2.5 mg by mouth daily.  . metoprolol succinate (TOPROL-XL) 100 MG 24 hr tablet TAKE 1/2 TO 1 TABLET DAILY  . montelukast (SINGULAIR) 10 MG tablet TAKE 1 TABLET BY MOUTH DAILY FOR ALLERGIES  . olmesartan (BENICAR) 40 MG tablet TAKE 1/2 TABLET BY MOUTH EVERY DAY IF BLOOD PRESSURE 140/90, TAKE 1 FULL TALBET  . Omega-3 Fatty Acids (FISH OIL PO) Take 1 capsule by mouth daily.  Marland Kitchen topiramate (TOPAMAX) 50 MG tablet TAKE 1/2-1 TABLET TWICE DAILY AT SUPPERTIME & BEDTIME FOR DIETING & WEIGHT LOSS  . valACYclovir (VALTREX) 500 MG tablet Take 500 mg by mouth 2 (two) times daily as needed (fever blister outbreak).   No current facility-administered medications on file prior to visit.    ROS: all negative except above.   Physical Exam:  BP 110/72   Pulse 74   Temp (!) 97.3 F (36.3 C)   Wt 138 lb 9.6 oz (62.9 kg)   SpO2 98%   BMI 26.62 kg/m   General  Appearance: Well nourished, in no apparent distress. Eyes: PERRLA, EOMs, conjunctiva no swelling or erythema Sinuses: No Frontal/maxillary tenderness ENT/Mouth: Ext aud canals clear, TMs without erythema, bulging. No erythema, swelling, or exudate on post pharynx.  Tonsils not swollen or erythematous. Hearing normal.  Neck: Supple, thyroid normal.  Respiratory: Respiratory effort normal, BS equal bilaterally without rales, rhonchi, wheezing or stridor.  Cardio: RRR with no MRGs. Brisk peripheral pulses without edema.  Abdomen: Soft, + BS.  Non tender, no guarding, rebound, hernias, masses. Lymphatics: Non tender without lymphadenopathy.  Musculoskeletal: Full ROM, 5/5 strength, normal gait.  Skin: Warm, dry without rashes, lesions, ecchymosis.  Neuro: Cranial nerves intact.  Normal muscle tone, no cerebellar symptoms. Sensation intact.  Psych: Awake and oriented X 3, normal affect, Insight and Judgment appropriate.     Garnet Sierras, NP 12:02 PM [facility]

## 2020-04-02 NOTE — Patient Instructions (Signed)
   We will contact you via MyChart with your lab results in 1-3 days.  Possible will get X-ray results today, likely tomorrow.   Try taking Norel AD, this has a nasal decongestant, antihistamine and acetaminophen (tylenol) in it.  See if this helps with th nasal congestion.   You may need another round of steroids to help clear remaining symptoms.  We will discuss this after results.   Monitor your blood pressure at home.  Record on sheet provided to look for low or high trends.

## 2020-04-03 ENCOUNTER — Other Ambulatory Visit: Payer: Self-pay | Admitting: Adult Health Nurse Practitioner

## 2020-04-03 DIAGNOSIS — J4521 Mild intermittent asthma with (acute) exacerbation: Secondary | ICD-10-CM

## 2020-04-03 MED ORDER — DEXAMETHASONE 2 MG PO TABS: ORAL_TABLET | ORAL | 0 refills | Status: DC

## 2020-04-04 ENCOUNTER — Telehealth: Payer: Self-pay | Admitting: Cardiovascular Disease

## 2020-04-04 ENCOUNTER — Ambulatory Visit: Payer: BC Managed Care – PPO | Admitting: Adult Health Nurse Practitioner

## 2020-04-04 NOTE — Telephone Encounter (Signed)
RN returned call to patient regarding blood pressure changes. Patient states she wanted Dr. Acie Fredrickson to be aware that she did experience an episode of low blood pressure over the last weekend after experiencing difficulty with asthma and allergies. Patient reports her BP dropped to 92/44 on Saturday, so she did not take her BP meds and took in plenty of fluids and was able to get her BP up. Patient reduced her Toprol to 1/2 on Sunday and she was feeling better. Patient returned from her trip and seen her PCP earlier this week who thought she may have possibly had COVID. PCP recommended that patient follow-up with her cardiologist due to not being seen in a while. Patient has an appointment setup on 04/16/2020 with Melina Copa. Patient has since resumed taking her normal BP meds and is feeling better. RN will route call to Dr. Acie Fredrickson for advice. RN encouraged patient to contact the office with any questions or if symptoms return in the meantime. Patient verbalized understanding.

## 2020-04-04 NOTE — Telephone Encounter (Signed)
Pt c/o BP issue: STAT if pt c/o blurred vision, one-sided weakness or slurred speech  1. What are your last 5 BP readings? 94/44 but is now averaging 110/70-72  2. Are you having any other symptoms (ex. Dizziness, headache, blurred vision, passed out)? Headaches and fatigue  3. What is your BP issue? [name] She wanted to let Dr. Acie Fredrickson know. I have scheduled her an appt with Melina Copa on 04/05 since she was due back in December to be seen.

## 2020-04-13 ENCOUNTER — Encounter: Payer: Self-pay | Admitting: Physician Assistant

## 2020-04-13 NOTE — Progress Notes (Signed)
[facility]   Cardiology Office Note    Date:  04/16/2020   ID:  Gail Bailey, DOB 03-21-1960, MRN [id number]  PCP:  Unk Pinto, MD  Cardiologist:  Mertie Moores, MD  Electrophysiologist:  None   Chief Complaint: shortness of breath  History of Present Illness:   Gail Bailey is a 60 y.o. female with history of TIA 2016, LV dysfunction, HTN, LBBB, HLD (followed by PCP), mild sleep apnea home study 2020 (CPAP not recommended) who presents for follow-up. She originally established care with Dr. Acie Fredrickson for treatment of HTN. She had reported a cath in 1999 that was normal (records not available). She had a TIA in 2016. Neck CTA, head CT and brain MRI were unremarkable. Her echo did demonstrate EF 40-45% with apical and anteroseptal HK, mod diastolic dysfunction. ASA and statin was started and her estrogen was stopped. Stress test was low risk in 2016. Cardiac MRI in 2017 demonstrated EF 59% without prior infarct or any concerning findings reported. She had a repeat stress test in 2018 which was also low risk with fixed septal defect c/w LBBB vs prior infarct, no ischemia, EF 53% with septal akinesis.  Most recent echocardiogram in May 2020 showed EF 50%, LBBB with mild septal hypokinesis, otherwise normal.  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 The last few weeks she has had issues with allergies/asthma and dry voice requiring management from PCP with round of antibiotics and steroids. CXR 04/03/20 NAD. She reports that following weekend she was at the beach and had an episode where she felt terrible with SOB and profound fatigue. She denied overt chest pain but had some chest pressure that she initially thought was her asthma. She checked her VS and O2 sat 92% and BP in the low 40J systolic. She is not sure what the HR was. She rested,  pushed fluids, and spent the day with her feet up. Symptoms improved. Her shortness of breath improved since then but still feels fatigued. No further chest discomfort. HR today 49bpm by EKG and BP low-normal. No syncope or edema. Regarding antihypertensive med regimen below with variable dosing listed, she confirms she is taking Benicar 1 tablet daily and metoprolol 1/2 of a 100mg  tablet daily.  Labwork independently reviewed: 03/2020 Trig 247, LDL 107, CBC wnl, CMET ok except glu 132, TSH wnl   Past Medical History:  Diagnosis Date  . Allergy   . Anxiety   . Asthma   . Cardiomyopathy ([place])    a. Echo 8/16:  EF 40-45%, apical and ant-septal HK, Gr 2 DD  . Diverticula, colon   . Family history of adverse reaction to anesthesia    sisiter had PONV  . Headache    rare migraines  . History of cardiovascular stress test    Myoview 8/16:  EF 48%, anterior and apical defect c/w breast atten, no ischemia; Low Risk // Nuclear stress test 9/18: Low risk stress nuclear study with fixed septal defect consistent with LBBB vs prior infarct; no ischemia; EF 53 with septal akinesis.  . Hyperlipidemia   . Hypertension   . Irregular periods/menstrual cycles   . LBBB (left bundle branch block)   . Melanoma ([place])    right arm  . Seasonal allergies   . TIA (transient ischemic attack)   . Torn meniscus  Past Surgical History:  Procedure Laterality Date  . ABLATION    . BREAST REDUCTION SURGERY    . CARDIAC CATHETERIZATION    . CESAREAN SECTION     x2  . KNEE SURGERY     partial knee replacement right  . MELANOMA EXCISION WITH SENTINEL LYMPH NODE BIOPSY Right 09/15/2017   Procedure: BIOPSY RIGHT SHOULDER MOLE AND WIDE LOCAL EXCISION OF RIGHT ARM MELANOMA, ADVANCEMENT FLAP CLOSURE, SENTINEL LYMPH NODE BIOPSY  RIGHT ARM MELANOMA;  Surgeon: Stark Klein, MD;  Location: [facility];  Service: General;  Laterality: Right;  . REDUCTION MAMMAPLASTY Bilateral   . REPAIR QUADRICEPS /  HAMSTRING MUSCLE    . REPLACEMENT TOTAL KNEE Left 09/20/2018   Dr[facility] Okey Dupre at [facility]  . TONSILLECTOMY    . tubes in ears  10/12    Current Medications: Current Meds  Medication Sig  . acyclovir ointment (ZOVIRAX) 5 % Apply 1 application topically every 3 (three) hours.  . Adapalene-Benzoyl Peroxide (EPIDUO) 0.1-2.5 % gel Apply 1 application topically as needed (acne).  Marland Kitchen albuterol (VENTOLIN HFA) 108 (90 Base) MCG/ACT inhaler INHALE 2 PUFFS INTO LUNGS EVERY 4 HOURS AS NEEDED FOR WHEEZING OR SHORTNESS OF BREATH  . amLODipine (NORVASC) 10 MG tablet TAKE 1 TABLET DAILY FOR BLOOD PRESSURE  . aspirin EC 81 MG tablet Take 1 tablet (81 mg total) by mouth daily.  Marland Kitchen atorvastatin (LIPITOR) 20 MG tablet Take 1 tab 5 days a week or as directed for cholesterol.  . Cholecalciferol (VITAMIN D PO) Take 5,000 Units by mouth daily.   Marland Kitchen escitalopram (LEXAPRO) 10 MG tablet TAKE 1 TABLET DAILY FOR MOOD & ANXIETY  . estradiol (ESTRACE) 0.5 MG tablet Take 0.5 mg by mouth daily.  . fluticasone (FLONASE) 50 MCG/ACT nasal spray SPRAY 2 SPRAYS INTO EACH NOSTRIL EVERY DAY  . furosemide (LASIX) 40 MG tablet TAKE 1 TABLET EVERY DAY FOR BLOOD PRESSURE AND FLUID  . ibuprofen (ADVIL) 800 MG tablet TAKE 1 TABLET 3 TIMES A DAY WITH FOOD IF NEEDED FOR PAIN & INFLAMMATION / CRAMPING  . medroxyPROGESTERone (PROVERA) 2.5 MG tablet Take 2.5 mg by mouth daily.  . metoprolol succinate (TOPROL-XL) 100 MG 24 hr tablet TAKE 1/2 TO 1 TABLET DAILY  . montelukast (SINGULAIR) 10 MG tablet TAKE 1 TABLET BY MOUTH DAILY FOR ALLERGIES  . olmesartan (BENICAR) 40 MG tablet TAKE 1/2 TABLET BY MOUTH EVERY DAY IF BLOOD PRESSURE 140/90, TAKE 1 FULL TALBET (Patient taking differently: Take 40 mg by mouth daily. TAKE 1/2 TABLET BY MOUTH EVERY DAY IF BLOOD PRESSURE 140/90, TAKE 1 FULL TALBET)  . Omega-3 Fatty Acids (FISH OIL PO) Take 1 capsule by mouth daily.  Marland Kitchen topiramate (TOPAMAX) 50 MG tablet TAKE 1/2-1 TABLET TWICE DAILY AT SUPPERTIME & BEDTIME FOR  DIETING & WEIGHT LOSS  . valACYclovir (VALTREX) 500 MG tablet Take 500 mg by mouth 2 (two) times daily as needed (fever blister outbreak).      Allergies:   Oxycodone   Social History   Socioeconomic History  . Marital status: Divorced    Spouse name: Not on file  . Number of children: 2  . Years of education: Not on file  . Highest education level: Not on file  Occupational History  . Occupation: Chief Operating Officer: [employer]  Tobacco Use  . Smoking status: Never Smoker  . Smokeless tobacco: Never Used  Vaping Use  . Vaping Use: Never used  Substance and Sexual Activity  . Alcohol  use: Yes    Comment: 1-2 glasses wine nightly  . Drug use: No  . Sexual activity: Not on file  Other Topics Concern  . Not on file  Social History Narrative   Lives with husband and son   Social Determinants of Health   Financial Resource Strain: Not on file  Food Insecurity: Not on file  Transportation Needs: Not on file  Physical Activity: Not on file  Stress: Not on file  Social Connections: Not on file     Family History:  The patient's family history includes Asthma in her sister and son; Breast cancer in her maternal grandmother; Cancer in her maternal grandmother; Diabetes in her mother; Heart attack in her paternal grandmother; Heart attack (age of onset: 19) in her mother; Heart disease in her mother; Hypertension in her mother; Other in her father. There is no history of Colon cancer, Colon polyps, Esophageal cancer, Stomach cancer, or Rectal cancer.  ROS:   Please see the history of present illness. Otherwise, review of systems is positive for easy bruising at times. All other systems are reviewed and otherwise negative.    EKGs/Labs/Other Studies Reviewed:    Studies reviewed are outlined and summarized above. Reports included below if pertinent.  NST 09/2016  Nuclear stress EF: 53%.  Defect 1: There is a large defect of severe severity present in  the basal anteroseptal and mid anteroseptal location.  This is a low risk study.  The left ventricular ejection fraction is mildly decreased (45-54%).   Low risk stress nuclear study with fixed septal defect consistent with LBBB vs prior infarct; no ischemia; EF 53 with septal akinesis.  2D echo 2020 1. The left ventricle has a visually estimated ejection fraction of 50%.  There was septal-lateral dyssynchrony consistent with LBBB. Mild septal  hypokinesis. The cavity size was normal. Left ventricular diastolic  Doppler parameters are consistent with  impaired relaxation.  2. The right ventricle has normal systolic function. The cavity was  normal. There is no increase in right ventricular wall thickness.  3. The aortic valve is tricuspid. No stenosis of the aortic valve.  4. The aortic root is normal in size and structure.  5. No evidence of mitral valve stenosis. No significant mitral  regurgitation.  6. Normal IVC size. No complete TR doppler jet so unable to estimate PA  systolic pressure.     EKG:  EKG is ordered today, personally reviewed, SB 49bpm, LBBB, baseline wander, no acute STT changes Also reviewed from 12/20/19 with SB 54bpm, IVCD/LBBB pattern similar to prior.  Recent Labs: 12/20/2019: Magnesium 2.1 04/02/2020: ALT 14; BUN 14; Creat 0.84; Hemoglobin 14.8; Platelets 345; Potassium 3.5; Sodium 139; TSH 0.92  Recent Lipid Panel    Component Value Date/Time   CHOL 183 04/02/2020 1225   TRIG 247 (H) 04/02/2020 1225   HDL 39 (L) 04/02/2020 1225   CHOLHDL 4.7 04/02/2020 1225   VLDL 43 (H) 08/13/2016 1123   LDLCALC 107 (H) 04/02/2020 1225    PHYSICAL EXAM:    VS:  BP 108/70   Pulse (!) 50   Ht 5\' 1"  (1.549 m)   Wt 139 lb 3.2 oz (63.1 kg)   SpO2 98%   BMI 26.30 kg/m   BMI: Body mass index is 26.3 kg/m.  GEN: Well nourished, well developed female in no acute distress HEENT: normocephalic, atraumatic Neck: no JVD, carotid bruits, or masses Cardiac: RRR;  no murmurs, rubs, or gallops, no edema  Respiratory:  clear to auscultation  bilaterally, normal work of breathing GI: soft, nontender, nondistended, + BS MS: no deformity or atrophy Skin: warm and dry, no rash Neuro:  Alert and Oriented x 3, Strength and sensation are intact, follows commands Psych: euthymic mood, full affect  Wt Readings from Last 3 Encounters:  04/16/20 139 lb 3.2 oz (63.1 kg)  04/02/20 138 lb 9.6 oz (62.9 kg)  03/21/20 136 lb (61.7 kg)     ASSESSMENT & PLAN:   1. Shortness of breath/fatigue - she is mildly bradycardic today and has been experiencing some soft blood pressures at times. Question whether this is contributing to her symptoms. She is not tachycardic, tachypneic or hypoxic and no signs of VTE on exam. We will decrease her Toprol from 50mg  to 25mg  daily. I do not think this will make a huge impact on her blood pressure so will subsequently decrease her amlodipine to 5 mg daily as well. Given her SOB/chest pressure, will undertake an updated 2D echocardiogram and coronary CTA. She has had prior ischemic evaluations with noninvasive stress testing but a coronary CTA will give [place] a more definitive exclusion of coronary artery disease. With baseline sinus bradycardia, does not seem that she needs any additional beta blockade the day of the test. Since symptoms developed several days after the last labwork in March, will also update CBC, BMET, BNP and TSH today for completeness.  2. 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 F/u BNP with labs. Will follow-up echocardiogram and coronary CTA as above. Continue Lasix/ARB for now but continue to reassess dosing at follow-up contingent on clinical response and imaging results. If LV function is down again, we may need to consider discontinuing amlodipine altogether in lieu of titrating GDMT.  3. LBBB,  with sinus bradycardia - LBBB chronic for patient. See above regarding HR.  4. Essential HTN - follow BP with medication changes as above.  5. History of TIA with hyperlipidemia/goal LDL <70 - most recent lipid panels have shown suboptimal control. She has not been advised of any recent changes prior to this visit. Denies any issues with taking statin daily. Will increase atorvastatin to 40mg  daily with f/u CMET/lipids in 8 weeks.  6. Bruises easily - in addition to CBC will get PT/INR and PTT. If these are normal, will advise f/u PCP for this.  Disposition: F/u with me after completion of above testing.  Medication Adjustments/Labs and Tests Ordered: Current medicines are reviewed at length with the patient today.  Concerns regarding medicines are outlined above. Medication changes, Labs and Tests ordered today are summarized above and listed in the Patient Instructions accessible in Encounters.   Signed, Charlie Pitter, PA-C  04/16/2020 9:27 AM    [address], [place], [place]  [address] Phone: [phone]; Fax: [phone]

## 2020-04-16 ENCOUNTER — Ambulatory Visit (INDEPENDENT_AMBULATORY_CARE_PROVIDER_SITE_OTHER): Payer: BC Managed Care – PPO | Admitting: Physician Assistant

## 2020-04-16 ENCOUNTER — Encounter: Payer: Self-pay | Admitting: Physician Assistant

## 2020-04-16 ENCOUNTER — Other Ambulatory Visit: Payer: Self-pay

## 2020-04-16 ENCOUNTER — Other Ambulatory Visit: Payer: Self-pay | Admitting: *Deleted

## 2020-04-16 VITALS — BP 108/70 | HR 50 | Ht 61.0 in | Wt 139.2 lb

## 2020-04-16 DIAGNOSIS — I428 Other cardiomyopathies: Secondary | ICD-10-CM

## 2020-04-16 DIAGNOSIS — R0602 Shortness of breath: Secondary | ICD-10-CM | POA: Diagnosis not present

## 2020-04-16 DIAGNOSIS — R238 Other skin changes: Secondary | ICD-10-CM

## 2020-04-16 DIAGNOSIS — R233 Spontaneous ecchymoses: Secondary | ICD-10-CM

## 2020-04-16 DIAGNOSIS — E785 Hyperlipidemia, unspecified: Secondary | ICD-10-CM

## 2020-04-16 DIAGNOSIS — I447 Left bundle-branch block, unspecified: Secondary | ICD-10-CM

## 2020-04-16 DIAGNOSIS — I10 Essential (primary) hypertension: Secondary | ICD-10-CM | POA: Diagnosis not present

## 2020-04-16 DIAGNOSIS — R001 Bradycardia, unspecified: Secondary | ICD-10-CM

## 2020-04-16 DIAGNOSIS — Z8673 Personal history of transient ischemic attack (TIA), and cerebral infarction without residual deficits: Secondary | ICD-10-CM | POA: Diagnosis not present

## 2020-04-16 LAB — CBC
Hematocrit: 43.2 % (ref 34.0–46.6)
Hemoglobin: 14.3 g/dL (ref 11.1–15.9)
MCH: 30.3 pg (ref 26.6–33.0)
MCHC: 33.1 g/dL (ref 31.5–35.7)
MCV: 92 fL (ref 79–97)
Platelets: 332 10*3/uL (ref 150–450)
RBC: 4.72 x10E6/uL (ref 3.77–5.28)
RDW: 14.1 % (ref 11.7–15.4)
WBC: 7.1 10*3/uL (ref 3.4–10.8)

## 2020-04-16 LAB — APTT: aPTT: 24 s (ref 24–33)

## 2020-04-16 LAB — PROTIME-INR
INR: 0.9 (ref 0.9–1.2)
Prothrombin Time: 9.4 s (ref 9.1–12.0)

## 2020-04-16 MED ORDER — METOPROLOL SUCCINATE ER 25 MG PO TB24: 12.5000 mg | ORAL_TABLET | Freq: Every day | ORAL | 3 refills | Status: DC

## 2020-04-16 MED ORDER — ATORVASTATIN CALCIUM 40 MG PO TABS: 40.0000 mg | ORAL_TABLET | Freq: Every day | ORAL | 3 refills | Status: DC

## 2020-04-16 MED ORDER — AMLODIPINE BESYLATE 5 MG PO TABS: 5.0000 mg | ORAL_TABLET | Freq: Every day | ORAL | 3 refills | Status: DC

## 2020-04-16 NOTE — Patient Instructions (Addendum)
Medication Instructions:  Your physician has recommended you make the following change in your medication:  1.  DECREASE the Amlodipine to 5 mg taking 1 tablet daily 2.  REDUCE the Toprol to XL 25 mg taking 1/2 tablet daily 3.  INCREASE the Atorvastatin to 40 mg taking 1 tablet daily  *If you need a refill on your cardiac medications before your next appointment, please call your pharmacy*   Lab Work: TODAY:  BMET, PRO BNP, CBC, TSH, PT/INR & PTT  8 WEEKS:  FASTING LIPID & CMET  If you have labs (blood work) drawn today and your tests are completely normal, you will receive your results only by: Marland Kitchen MyChart Message (if you have MyChart) OR . A paper copy in the mail If you have any lab test that is abnormal or we need to change your treatment, we will call you to review the results.   Testing/Procedures: Your physician has requested that you have an echocardiogram. Echocardiography is a painless test that uses sound waves to create images of your heart. It provides your doctor with information about the size and shape of your heart and how well your heart's chambers and valves are working. This procedure takes approximately one hour. There are no restrictions for this procedure.    Your physician has requested that you have cardiac CT. Cardiac computed tomography (CT) is a painless test that uses an x-ray machine to take clear, detailed pictures of your heart. For further information please visit [email or web address]. Please follow instruction sheet BELOW:   [facility] [place], [place] [address] [phone]   If scheduled at [facility], please arrive at the [facility] main entrance (entrance A) of [facility] 30 minutes prior to test start time. Proceed to the [facility] Radiology Department (first floor) to check-in and test prep.   Please follow these instructions carefully (unless otherwise directed):  On the Night Before the  Test: . Be sure to Drink plenty of water. . Do not consume any caffeinated/decaffeinated beverages or chocolate 12 hours prior to your test. . Do not take any antihistamines 12 hours prior to your test.   On the Day of the Test: . Drink plenty of water until 1 hour prior to the test. . Do not eat any food 4 hours prior to the test. . You may take your regular medications prior to the test.  . Take metoprolol (Lopressor) two hours prior to test. . FEMALES- please wear underwire-free bra if available       After the Test: . Drink plenty of water. . After receiving IV contrast, you may experience a mild flushed feeling. This is normal. . On occasion, you may experience a mild rash up to 24 hours after the test. This is not dangerous. If this occurs, you can take Benadryl 25 mg and increase your fluid intake. . If you experience trouble breathing, this can be serious. If it is severe call 911 IMMEDIATELY. If it is mild, please call our office. . If you take any of these medications: Glipizide/Metformin, Avandament, Glucavance, please do not take 48 hours after completing test unless otherwise instructed.   Once we have confirmed authorization from your insurance company, we will call you to set up a date and time for your test. Based on how quickly your insurance processes prior authorizations requests, please allow up to 4 weeks to be contacted for scheduling your Cardiac CT appointment. Be advised that routine Cardiac CT  appointments could be scheduled as many as 8 weeks after your provider has ordered it.  For non-scheduling related questions, please contact the cardiac imaging nurse navigator should you have any questions/concerns: Marchia Bond, Cardiac Imaging Nurse Navigator Gordy Clement, Cardiac Imaging Nurse Navigator [facility] Direct Office Dial: [phone]   For scheduling needs, including cancellations and rescheduling, please call [place],  [phone].    Follow-Up: At [facility], you and your health needs are our priority.  As part of our continuing mission to provide you with exceptional heart care, we have created designated Provider Care Teams.  These Care Teams include your primary Cardiologist (physician) and Advanced Practice Providers (APPs -  Physician Assistants and Nurse Practitioners) who all work together to provide you with the care you need, when you need it.  We recommend signing up for the patient portal called "MyChart".  Sign up information is provided on this After Visit Summary.  MyChart is used to connect with patients for Virtual Visits (Telemedicine).  Patients are able to view lab/test results, encounter notes, upcoming appointments, etc.  Non-urgent messages can be sent to your provider as well.   To learn more about what you can do with MyChart, go to [email or web address].    Your next appointment:   After Testing with Melina Copa, PA-C  The format for your next appointment:   In Person   Echocardiogram An echocardiogram is a test that uses sound waves (ultrasound) to produce images of the heart. Images from an echocardiogram can provide important information about:  Heart size and shape.  The size and thickness and movement of your heart's walls.  Heart muscle function and strength.  Heart valve function or if you have stenosis. Stenosis is when the heart valves are too narrow.  If blood is flowing backward through the heart valves (regurgitation).  A tumor or infectious growth around the heart valves.  Areas of heart muscle that are not working well because of poor blood flow or injury from a heart attack.  Aneurysm detection. An aneurysm is a weak or damaged part of an artery wall. The wall bulges out from the normal force of blood pumping through the body. Tell a health care provider about:  Any allergies you have.  All medicines you are taking, including vitamins, herbs,  eye drops, creams, and over-the-counter medicines.  Any blood disorders you have.  Any surgeries you have had.  Any medical conditions you have.  Whether you are pregnant or may be pregnant. What are the risks? Generally, this is a safe test. However, problems may occur, including an allergic reaction to dye (contrast) that may be used during the test. What happens before the test? No specific preparation is needed. You may eat and drink normally. What happens during the test?  You will take off your clothes from the waist up and put on a hospital gown.  Electrodes or electrocardiogram (ECG)patches may be placed on your chest. The electrodes or patches are then connected to a device that monitors your heart rate and rhythm.  You will lie down on a table for an ultrasound exam. A gel will be applied to your chest to help sound waves pass through your skin.  A handheld device, called a transducer, will be pressed against your chest and moved over your heart. The transducer produces sound waves that travel to your heart and bounce back (or "echo" back) to the transducer. These sound waves will be captured in real-time and  changed into images of your heart that can be viewed on a video monitor. The images will be recorded on a computer and reviewed by your health care provider.  You may be asked to change positions or hold your breath for a short time. This makes it easier to get different views or better views of your heart.  In some cases, you may receive contrast through an IV in one of your veins. This can improve the quality of the pictures from your heart. The procedure may vary among health care providers and hospitals.   What can I expect after the test? You may return to your normal, everyday life, including diet, activities, and medicines, unless your health care provider tells you not to do that. Follow these instructions at home:  It is up to you to get the results of your  test. Ask your health care provider, or the department that is doing the test, when your results will be ready.  Keep all follow-up visits. This is important. Summary  An echocardiogram is a test that uses sound waves (ultrasound) to produce images of the heart.  Images from an echocardiogram can provide important information about the size and shape of your heart, heart muscle function, heart valve function, and other possible heart problems.  You do not need to do anything to prepare before this test. You may eat and drink normally.  After the echocardiogram is completed, you may return to your normal, everyday life, unless your health care provider tells you not to do that. This information is not intended to replace advice given to you by your health care provider. Make sure you discuss any questions you have with your health care provider. Document Revised: 08/22/2019 Document Reviewed: 08/22/2019 Elsevier Patient Education  2021 [employer].   Other Instructions

## 2020-04-17 ENCOUNTER — Telehealth: Payer: Self-pay | Admitting: Cardiovascular Disease

## 2020-04-17 LAB — BASIC METABOLIC PANEL
BUN/Creatinine Ratio: 16 (ref 9–23)
BUN: 14 mg/dL (ref 6–24)
CO2: 21 mmol/L (ref 20–29)
Calcium: 8.8 mg/dL (ref 8.7–10.2)
Chloride: 105 mmol/L (ref 96–106)
Creatinine, Ser: 0.85 mg/dL (ref 0.57–1.00)
Glucose: 129 mg/dL — ABNORMAL HIGH (ref 65–99)
Potassium: 4.3 mmol/L (ref 3.5–5.2)
Sodium: 140 mmol/L (ref 134–144)
eGFR: 79 mL/min/{1.73_m2} (ref >59)

## 2020-04-17 LAB — PRO B NATRIURETIC PEPTIDE: NT-Pro BNP: 158 pg/mL (ref 0–287)

## 2020-04-17 NOTE — Telephone Encounter (Signed)
RN returned call to patient to discuss lab results. Patient verbalized understanding. Encouraged patient to contact the office with any questions or concerns.

## 2020-04-17 NOTE — Telephone Encounter (Signed)
Patient is returning call to discuss lab results. 

## 2020-05-03 ENCOUNTER — Telehealth (HOSPITAL_COMMUNITY): Payer: Self-pay | Admitting: *Deleted

## 2020-05-03 ENCOUNTER — Telehealth (HOSPITAL_COMMUNITY): Payer: Self-pay | Admitting: Emergency Medicine

## 2020-05-03 NOTE — Telephone Encounter (Signed)
Attempted to call patient regarding upcoming cardiac CT appointment. °Left message on voicemail with name and callback number °Emara Lichter RN Navigator Cardiac Imaging °[facility] °[phone] Office °[phone] Cell ° °

## 2020-05-03 NOTE — Telephone Encounter (Signed)
Pt returning call regarding upcoming cardiac imaging study; pt verbalizes understanding of appt date/time, parking situation and where to check in, pre-test NPO status and medications ordered, and verified current allergies; name and call back number provided for further questions should they arise  Gordy Clement RN Navigator Cardiac Imaging [facility] [phone] office [phone] cell  Pt to hold BP medications other than her daily metoprolol succinate.

## 2020-05-07 ENCOUNTER — Ambulatory Visit (HOSPITAL_COMMUNITY): Admission: RE | Admit: 2020-05-07 | Payer: BC Managed Care – PPO | Source: Ambulatory Visit

## 2020-05-08 ENCOUNTER — Ambulatory Visit (HOSPITAL_BASED_OUTPATIENT_CLINIC_OR_DEPARTMENT_OTHER)
Admission: RE | Admit: 2020-05-08 | Discharge: 2020-05-08 | Disposition: A | Discharge status: Home / Self Care | Payer: BC Managed Care – PPO | Source: Ambulatory Visit | Attending: Physician Assistant | Admitting: Physician Assistant

## 2020-05-08 ENCOUNTER — Other Ambulatory Visit: Payer: Self-pay

## 2020-05-08 DIAGNOSIS — I447 Left bundle-branch block, unspecified: Secondary | ICD-10-CM | POA: Diagnosis not present

## 2020-05-08 DIAGNOSIS — R238 Other skin changes: Secondary | ICD-10-CM | POA: Diagnosis not present

## 2020-05-08 DIAGNOSIS — R0602 Shortness of breath: Secondary | ICD-10-CM | POA: Insufficient documentation

## 2020-05-08 DIAGNOSIS — I428 Other cardiomyopathies: Secondary | ICD-10-CM | POA: Diagnosis not present

## 2020-05-08 DIAGNOSIS — Z8673 Personal history of transient ischemic attack (TIA), and cerebral infarction without residual deficits: Secondary | ICD-10-CM | POA: Insufficient documentation

## 2020-05-08 DIAGNOSIS — R001 Bradycardia, unspecified: Secondary | ICD-10-CM | POA: Diagnosis not present

## 2020-05-08 DIAGNOSIS — R233 Spontaneous ecchymoses: Secondary | ICD-10-CM

## 2020-05-08 DIAGNOSIS — I10 Essential (primary) hypertension: Secondary | ICD-10-CM | POA: Diagnosis not present

## 2020-05-08 DIAGNOSIS — E785 Hyperlipidemia, unspecified: Secondary | ICD-10-CM | POA: Diagnosis not present

## 2020-05-08 MED ORDER — METOPROLOL TARTRATE 5 MG/5ML IV SOLN
10.0000 mg | Freq: Once | INTRAVENOUS | Status: AC
  Administered 2020-05-08: 10 mg via INTRAVENOUS

## 2020-05-08 MED ORDER — NITROGLYCERIN 0.4 MG SL SUBL
0.8000 mg | SUBLINGUAL_TABLET | SUBLINGUAL | Status: DC | PRN
  Administered 2020-05-08: 0.8 mg via SUBLINGUAL

## 2020-05-08 MED ORDER — IOHEXOL 350 MG/ML SOLN
95.0000 mL | Freq: Once | INTRAVENOUS | Status: AC | PRN
  Administered 2020-05-08: 95 mL via INTRAVENOUS

## 2020-05-08 NOTE — Progress Notes (Signed)
Pt presents for cardiac CT.  Pt tolerated scan and medications without incident.  BP checked after scan and was stable.  Pt denies any symptoms and ambulated out of department with a steady gait.

## 2020-05-09 ENCOUNTER — Ambulatory Visit (HOSPITAL_COMMUNITY)
Admission: RE | Admit: 2020-05-09 | Discharge: 2020-05-09 | Disposition: A | Discharge status: Home / Self Care | Payer: BC Managed Care – PPO | Source: Ambulatory Visit | Attending: Physician Assistant | Admitting: Physician Assistant

## 2020-05-09 DIAGNOSIS — R238 Other skin changes: Secondary | ICD-10-CM | POA: Diagnosis not present

## 2020-05-09 DIAGNOSIS — R001 Bradycardia, unspecified: Secondary | ICD-10-CM | POA: Insufficient documentation

## 2020-05-09 DIAGNOSIS — I428 Other cardiomyopathies: Secondary | ICD-10-CM | POA: Diagnosis not present

## 2020-05-09 DIAGNOSIS — Z8673 Personal history of transient ischemic attack (TIA), and cerebral infarction without residual deficits: Secondary | ICD-10-CM | POA: Diagnosis not present

## 2020-05-09 DIAGNOSIS — E785 Hyperlipidemia, unspecified: Secondary | ICD-10-CM | POA: Insufficient documentation

## 2020-05-09 DIAGNOSIS — R0602 Shortness of breath: Secondary | ICD-10-CM | POA: Insufficient documentation

## 2020-05-09 DIAGNOSIS — I10 Essential (primary) hypertension: Secondary | ICD-10-CM | POA: Diagnosis not present

## 2020-05-09 DIAGNOSIS — R233 Spontaneous ecchymoses: Secondary | ICD-10-CM

## 2020-05-09 DIAGNOSIS — I447 Left bundle-branch block, unspecified: Secondary | ICD-10-CM | POA: Diagnosis not present

## 2020-05-19 ENCOUNTER — Other Ambulatory Visit: Payer: Self-pay | Admitting: Adult Health

## 2020-05-19 DIAGNOSIS — R109 Unspecified abdominal pain: Secondary | ICD-10-CM

## 2020-05-20 ENCOUNTER — Other Ambulatory Visit: Payer: Self-pay

## 2020-05-20 ENCOUNTER — Ambulatory Visit (HOSPITAL_COMMUNITY): Payer: BC Managed Care – PPO | Attending: Cardiology

## 2020-05-20 DIAGNOSIS — I447 Left bundle-branch block, unspecified: Secondary | ICD-10-CM | POA: Diagnosis not present

## 2020-05-20 DIAGNOSIS — R238 Other skin changes: Secondary | ICD-10-CM | POA: Diagnosis not present

## 2020-05-20 DIAGNOSIS — R001 Bradycardia, unspecified: Secondary | ICD-10-CM | POA: Diagnosis not present

## 2020-05-20 DIAGNOSIS — Z8673 Personal history of transient ischemic attack (TIA), and cerebral infarction without residual deficits: Secondary | ICD-10-CM | POA: Diagnosis not present

## 2020-05-20 DIAGNOSIS — R0602 Shortness of breath: Secondary | ICD-10-CM

## 2020-05-20 DIAGNOSIS — E785 Hyperlipidemia, unspecified: Secondary | ICD-10-CM | POA: Diagnosis not present

## 2020-05-20 DIAGNOSIS — I10 Essential (primary) hypertension: Secondary | ICD-10-CM

## 2020-05-20 DIAGNOSIS — I428 Other cardiomyopathies: Secondary | ICD-10-CM

## 2020-05-20 DIAGNOSIS — R233 Spontaneous ecchymoses: Secondary | ICD-10-CM

## 2020-05-20 LAB — ECHOCARDIOGRAM COMPLETE
Area-P 1/2: 1.75 cm2
S' Lateral: 3.3 cm

## 2020-05-29 DIAGNOSIS — M9901 Segmental and somatic dysfunction of cervical region: Secondary | ICD-10-CM | POA: Diagnosis not present

## 2020-06-05 DIAGNOSIS — M9901 Segmental and somatic dysfunction of cervical region: Secondary | ICD-10-CM | POA: Diagnosis not present

## 2020-06-11 ENCOUNTER — Other Ambulatory Visit: Payer: BC Managed Care – PPO | Admitting: *Deleted

## 2020-06-11 ENCOUNTER — Other Ambulatory Visit: Payer: Self-pay

## 2020-06-11 DIAGNOSIS — R233 Spontaneous ecchymoses: Secondary | ICD-10-CM

## 2020-06-11 DIAGNOSIS — I10 Essential (primary) hypertension: Secondary | ICD-10-CM | POA: Diagnosis not present

## 2020-06-11 DIAGNOSIS — E785 Hyperlipidemia, unspecified: Secondary | ICD-10-CM

## 2020-06-11 DIAGNOSIS — M9901 Segmental and somatic dysfunction of cervical region: Secondary | ICD-10-CM | POA: Diagnosis not present

## 2020-06-11 DIAGNOSIS — I428 Other cardiomyopathies: Secondary | ICD-10-CM

## 2020-06-11 DIAGNOSIS — R238 Other skin changes: Secondary | ICD-10-CM

## 2020-06-11 DIAGNOSIS — I447 Left bundle-branch block, unspecified: Secondary | ICD-10-CM

## 2020-06-11 DIAGNOSIS — Z8673 Personal history of transient ischemic attack (TIA), and cerebral infarction without residual deficits: Secondary | ICD-10-CM | POA: Diagnosis not present

## 2020-06-11 DIAGNOSIS — R001 Bradycardia, unspecified: Secondary | ICD-10-CM

## 2020-06-11 DIAGNOSIS — R0602 Shortness of breath: Secondary | ICD-10-CM

## 2020-06-11 LAB — COMPREHENSIVE METABOLIC PANEL
ALT: 15 IU/L (ref 0–32)
AST: 13 IU/L (ref 0–40)
Albumin/Globulin Ratio: 2 (ref 1.2–2.2)
Albumin: 4.3 g/dL (ref 3.8–4.9)
Alkaline Phosphatase: 96 IU/L (ref 44–121)
BUN/Creatinine Ratio: 20 (ref 9–23)
BUN: 19 mg/dL (ref 6–24)
Bilirubin Total: 0.4 mg/dL (ref 0.0–1.2)
CO2: 21 mmol/L (ref 20–29)
Calcium: 9.4 mg/dL (ref 8.7–10.2)
Chloride: 103 mmol/L (ref 96–106)
Creatinine, Ser: 0.93 mg/dL (ref 0.57–1.00)
Globulin, Total: 2.1 g/dL (ref 1.5–4.5)
Glucose: 126 mg/dL — ABNORMAL HIGH (ref 65–99)
Potassium: 3.6 mmol/L (ref 3.5–5.2)
Sodium: 138 mmol/L (ref 134–144)
Total Protein: 6.4 g/dL (ref 6.0–8.5)
eGFR: 71 mL/min/{1.73_m2} (ref >59)

## 2020-06-11 LAB — LIPID PANEL
Chol/HDL Ratio: 4.4 ratio (ref 0.0–4.4)
Cholesterol, Total: 136 mg/dL (ref 100–199)
HDL: 31 mg/dL — ABNORMAL LOW (ref >39)
LDL Chol Calc (NIH): 70 mg/dL (ref 0–99)
Triglycerides: 210 mg/dL — ABNORMAL HIGH (ref 0–149)
VLDL Cholesterol Cal: 35 mg/dL (ref 5–40)

## 2020-07-03 ENCOUNTER — Ambulatory Visit: Payer: BC Managed Care – PPO | Admitting: Internal Medicine

## 2020-07-17 DIAGNOSIS — Z20822 Contact with and (suspected) exposure to covid-19: Secondary | ICD-10-CM | POA: Diagnosis not present

## 2020-07-17 DIAGNOSIS — U071 COVID-19: Secondary | ICD-10-CM | POA: Diagnosis not present

## 2020-08-01 ENCOUNTER — Ambulatory Visit: Payer: BC Managed Care – PPO | Admitting: Internal Medicine

## 2020-08-01 ENCOUNTER — Encounter: Payer: Self-pay | Admitting: Internal Medicine

## 2020-08-01 ENCOUNTER — Other Ambulatory Visit: Payer: Self-pay

## 2020-08-01 VITALS — BP 118/78 | HR 61 | Temp 97.3°F | Resp 16 | Ht 60.5 in | Wt 136.0 lb

## 2020-08-01 DIAGNOSIS — B009 Herpesviral infection, unspecified: Secondary | ICD-10-CM

## 2020-08-01 DIAGNOSIS — E782 Mixed hyperlipidemia: Secondary | ICD-10-CM | POA: Diagnosis not present

## 2020-08-01 DIAGNOSIS — Z79899 Other long term (current) drug therapy: Secondary | ICD-10-CM | POA: Diagnosis not present

## 2020-08-01 DIAGNOSIS — R7309 Other abnormal glucose: Secondary | ICD-10-CM

## 2020-08-01 DIAGNOSIS — E559 Vitamin D deficiency, unspecified: Secondary | ICD-10-CM | POA: Diagnosis not present

## 2020-08-01 DIAGNOSIS — I10 Essential (primary) hypertension: Secondary | ICD-10-CM | POA: Diagnosis not present

## 2020-08-01 MED ORDER — VALACYCLOVIR HCL 500 MG PO TABS: ORAL_TABLET | ORAL | 2 refills | Status: DC

## 2020-08-01 NOTE — Addendum Note (Signed)
Addended by: Unk Pinto on: 08/01/2020 07:56 PM   Modules accepted: Orders

## 2020-08-01 NOTE — Patient Instructions (Signed)

## 2020-08-01 NOTE — Progress Notes (Addendum)
Future Appointments  Date Time Provider [place]  08/01/2020 11:30 AM Unk Pinto, MD [facility] None  09/10/2020 10:45 AM Charlie Pitter, PA-C [facility]  01/02/2021  - CPE 11:00 AM Unk Pinto, MD [facility] None    History of Present Illness:       This very nice 60 y.o. MWF presents for 6 month follow up with HTN, HLD, Pre-Diabetes and Vitamin D Deficiency. In Sept 2019 , she had a melanoma excised from her Rt arm.       Patient is treated for HTN (2014) & BP has been controlled at home. Today's BP is at goal - 118/78.  In 2017, she had a Normal Cardiac MRI scan  and in 2018 she had a neg Cardiac Myoview. Patient has had no complaints of any cardiac type chest pain, palpitations, dyspnea / orthopnea / PND, dizziness, claudication, or dependent edema.       Hyperlipidemia is controlled with diet & meds. Patient denies myalgias or other med SE's. Last Lipids were nat goal except elevated Trig's:  Lab Results  Component Value Date   CHOL 136 06/11/2020   HDL 31 (L) 06/11/2020   LDLCALC 70 06/11/2020   TRIG 210 (H) 06/11/2020   CHOLHDL 4.4 06/11/2020     Also, the patient has history of prediabetes (A1c 6.0% /2012) and has had no symptoms of reactive hypoglycemia, diabetic polys, paresthesias or visual blurring.  Last A1c was not at goal:  Lab Results  Component Value Date   HGBA1C 6.1 (H) 12/20/2019                                                           Further, the patient also has history of Vitamin D Deficiency ("48" on tx /2008)  and supplements Vitamin D without any suspected side-effects. Last vitamin D was   Lab Results  Component Value Date   VD25OH 8 04/02/2020     Current Outpatient Medications on File Prior to Visit  Medication Sig   acyclovir ointment (ZOVIRAX) 5 % Apply 1 application topically every 3 (three) hours.   Adapalene-Benzoyl Peroxide (EPIDUO) 0.1-2.5 % gel Apply 1 application topically as needed (acne).    albuterol (VENTOLIN HFA) 108 (90 Base) MCG/ACT inhaler INHALE 2 PUFFS INTO LUNGS EVERY 4 HOURS AS NEEDED FOR WHEEZING OR SHORTNESS OF BREATH   amLODipine (NORVASC) 5 MG tablet Take 1 tablet  daily.   aspirin EC 81 MG tablet Take 1 tablet daily.   atorvastatin (LIPITOR) 40 MG tablet Take 1 tablet  daily.   VITAMIN D 5,000 Units  Take daily.    escitalopram (LEXAPRO) 10 MG tablet TAKE 1 TABLET DAILY FOR MOOD & ANXIETY   estradiol (ESTRACE) 0.5 MG tablet Takedaily.    SPRAY 2 SPRAYS INTO EACH NOSTRIL EVERY DAY   furosemide 40 MG tablet TAKE 1 TABLET EVERY DAY FOR BLOOD PRESSURE AND FLUID   ibuprofen 800 MG tablet Take  1 tablet  3 x /day  with as needed for Pain & Inflammation   PROVERA) 2.5 MG tablet Take daily.   metoprolol succinate (TOPROL XL) 25 MG 24 hr tablet Take 0.5 tablets daily.   montelukast (SINGULAIR) 10 MG tablet TAKE 1 TABLET  DAILY FOR ALLERGIES   olmesartan (BENICAR) 40 MG tablet TAKE 1 TABLET EVERY DAY  Omega-3 Fatty Acids (FISH OIL PO) Take 1 capsule  daily.   topiramate (TOPAMAX) 50 MG tablet TAKE 1/2-1 TABLET TWICE DAILY AT SUPPERTIME & BEDTIME FOR DIETING & WEIGHT LOSS   valACYclovir (VALTREX) 500 MG tablet Take  2 times daily as needed     Allergies  Allergen Reactions   Oxycodone Itching: Trunk, palms of hands, legs, back     PMHx:   Past Medical History:  Diagnosis Date   Allergy    Anxiety    Asthma    Cardiomyopathy ([place])    a. Echo 8/16:  EF 40-45%, apical and ant-septal HK, Gr 2 DD   Diverticula, colon    Family history of adverse reaction to anesthesia    sisiter had PONV   Headache    rare migraines   History of cardiovascular stress test    Myoview 8/16:  EF 48%, anterior and apical defect c/w breast atten, no ischemia; Low Risk // Nuclear stress test 9/18: Low risk stress nuclear study with fixed septal defect consistent with LBBB vs prior infarct; no ischemia; EF 53 with septal akinesis.   Hyperlipidemia    Hypertension    Irregular  periods/menstrual cycles    LBBB (left bundle branch block)    Melanoma ([facility])    right arm   Seasonal allergies    TIA (transient ischemic attack)    Torn meniscus      Immunization History  Administered Date(s) Administered   Influenza Inj Mdck Quad Pf 10/06/2018, 10/06/2018   Influenza Inj Mdck Quad With Preservative 10/13/2016, 11/05/2017, 12/20/2019   Influenza Split 10/24/2013   Influenza,inj,quad, With Preservative 12/24/2015   Influenza-Unspecified 09/12/2013, 10/13/2015   PFIZER(Purple Top)SARS-COV-2 Vaccination 08/08/2019, 09/05/2019   PPD Test 06/13/2013, 09/13/2015, 10/13/2016, 11/05/2017, 12/20/2019   Pneumococcal Polysaccharide-23 02/12/2004   Td 10/12/2004   Tdap 01/09/2015     Past Surgical History:  Procedure Laterality Date   ABLATION     BREAST [place]     x2   KNEE SURGERY     partial knee replacement right   MELANOMA EXCISION WITH SENTINEL LYMPH NODE BIOPSY Right 09/15/2017   Procedure: BIOPSY RIGHT SHOULDER MOLE AND WIDE LOCAL EXCISION OF RIGHT ARM MELANOMA, ADVANCEMENT FLAP CLOSURE, SENTINEL LYMPH NODE BIOPSY  RIGHT ARM MELANOMA;  Surgeon: Stark Klein, MD;  Location: [place];  Service: General;  Laterality: Right;   REDUCTION MAMMAPLASTY Bilateral    REPAIR QUADRICEPS / HAMSTRING MUSCLE     REPLACEMENT TOTAL KNEE Left 09/20/2018   Dr. Okey Dupre at [place]     tubes in ears  10/12    FHx:    Reviewed / unchanged  SHx:    Reviewed / unchanged   Systems Review:  Constitutional: Denies fever, chills, wt changes, headaches, insomnia, fatigue, night sweats, change in appetite. Eyes: Denies redness, blurred vision, diplopia, discharge, itchy, watery eyes.  ENT: Denies discharge, congestion, post nasal drip, epistaxis, sore throat, earache, hearing loss, dental pain, tinnitus, vertigo, sinus pain, snoring.  CV: Denies chest pain, palpitations, irregular heartbeat,  syncope, dyspnea, diaphoresis, orthopnea, PND, claudication or edema. Respiratory: denies cough, dyspnea, DOE, pleurisy, hoarseness, laryngitis, wheezing.  Gastrointestinal: Denies dysphagia, odynophagia, heartburn, reflux, water brash, abdominal pain or cramps, nausea, vomiting, bloating, diarrhea, constipation, hematemesis, melena, hematochezia  or hemorrhoids. Genitourinary: Denies dysuria, frequency, urgency, nocturia, hesitancy, discharge, hematuria or flank pain. Musculoskeletal: Denies arthralgias, myalgias, stiffness, jt. swelling, pain, limping or strain/sprain.  Skin: Denies pruritus, rash, hives, warts, acne, eczema or change in skin lesion(s). Neuro: No weakness, tremor, incoordination, spasms, paresthesia or pain. Psychiatric: Denies confusion, memory loss or sensory loss. Endo: Denies change in weight, skin or hair change.  Heme/Lymph: No excessive bleeding, bruising or enlarged lymph nodes.  Physical Exam  BP 118/78   Pulse 61   Temp (!) 97.3 F (36.3 C)   Resp 16   Ht 5' 0.5" (1.537 m)   Wt 136 lb (61.7 kg)   SpO2 99%   BMI 26.12 kg/m   Appears  well nourished, well groomed  and in no distress.  Eyes: PERRLA, EOMs, conjunctiva no swelling or erythema. Sinuses: No frontal/maxillary tenderness ENT/Mouth: EAC's clear, TM's nl w/o erythema, bulging. Nares clear w/o erythema, swelling, exudates. Oropharynx clear without erythema or exudates. Oral hygiene is good. Tongue normal, non obstructing. Hearing intact.  Neck: Supple. Thyroid not palpable. Car 2+/2+ without bruits, nodes or JVD. Chest: Respirations nl with BS clear & equal w/o rales, rhonchi, wheezing or stridor.  Cor: Heart sounds normal w/ regular rate and rhythm without sig. murmurs, gallops, clicks or rubs. Peripheral pulses normal and equal  without edema.  Abdomen: Soft & bowel sounds normal. Non-tender w/o guarding, rebound, hernias, masses or organomegaly.  Lymphatics: Unremarkable.  Musculoskeletal: Full  ROM all peripheral extremities, joint stability, 5/5 strength and normal gait.  Skin: Warm, dry without exposed rashes, lesions or ecchymosis apparent.  Neuro: Cranial nerves intact, reflexes equal bilaterally. Sensory-motor testing grossly intact. Tendon reflexes grossly intact.  Pysch: Alert & oriented x 3.  Insight and judgement nl & appropriate. No ideations.  Assessment and Plan:  1. Essential hypertension  - Continue medication, monitor blood pressure at home.  - Continue DASH diet.  Reminder to go to the ER if any CP,  SOB, nausea, dizziness, severe HA, changes vision/speech.   - CBC with Differential/Platelet - COMPLETE METABOLIC PANEL WITH GFR - Magnesium - TSH  2. Hyperlipidemia, mixed  - Continue diet/meds, exercise,& lifestyle modifications.  - Continue monitor periodic cholesterol/liver & renal functions    - Lipid panel - TSH  3. Abnormal glucose  - Continue diet, exercise  - Lifestyle modifications.  - Monitor appropriate labs   - Hemoglobin A1c - Insulin, random  4. Vitamin D deficiency  - Continue supplementation.   - VITAMIN D 25 Hydroxy   5. Medication management  - CBC with Differential/Platelet - COMPLETE METABOLIC PANEL WITH GFR - Magnesium - Lipid panel - TSH - Hemoglobin A1c - Insulin, random - VITAMIN D 25 Hydroxy   6. HSV-1 infection  - valACYclovir (VALTREX) 500 MG tablet; Take  1 tablet  2 x /day as needed for Viral Suppression  Dispense: 180 tablet; Refill: 2         Discussed  regular exercise, BP monitoring, weight control to achieve/maintain BMI less than 25 and discussed med and SE's. Recommended labs to assess and monitor clinical status with further disposition pending results of labs.  I discussed the assessment and treatment plan with the patient. The patient was provided an opportunity to ask questions and all were answered. The patient agreed with the plan and demonstrated an understanding of the instructions.  I provided  over 30 minutes of exam, counseling, chart review and  complex critical decision making.        The patient was advised to call back or seek an in-person evaluation if the symptoms worsen or if the condition fails to improve as anticipated.  Kirtland Bouchard, MD

## 2020-08-02 LAB — HEMOGLOBIN A1C
Hgb A1c MFr Bld: 6 % of total Hgb — ABNORMAL HIGH (ref <5.7)
Mean Plasma Glucose: 126 mg/dL
eAG (mmol/L): 7 mmol/L

## 2020-08-02 LAB — COMPLETE METABOLIC PANEL WITH GFR
AG Ratio: 1.9 (calc) (ref 1.0–2.5)
ALT: 17 U/L (ref 6–29)
AST: 13 U/L (ref 10–35)
Albumin: 4.4 g/dL (ref 3.6–5.1)
Alkaline phosphatase (APISO): 92 U/L (ref 37–153)
BUN: 15 mg/dL (ref 7–25)
CO2: 28 mmol/L (ref 20–32)
Calcium: 8.9 mg/dL (ref 8.6–10.4)
Chloride: 102 mmol/L (ref 98–110)
Creat: 0.88 mg/dL (ref 0.50–1.03)
Globulin: 2.3 g/dL (calc) (ref 1.9–3.7)
Glucose, Bld: 113 mg/dL — ABNORMAL HIGH (ref 65–99)
Potassium: 3.7 mmol/L (ref 3.5–5.3)
Sodium: 138 mmol/L (ref 135–146)
Total Bilirubin: 0.6 mg/dL (ref 0.2–1.2)
Total Protein: 6.7 g/dL (ref 6.1–8.1)
eGFR: 76 mL/min/{1.73_m2} (ref >=60)

## 2020-08-02 LAB — LIPID PANEL
Cholesterol: 152 mg/dL (ref <200)
HDL: 37 mg/dL — ABNORMAL LOW (ref >=50)
LDL Cholesterol (Calc): 84 mg/dL (calc)
Non-HDL Cholesterol (Calc): 115 mg/dL (calc) (ref <130)
Total CHOL/HDL Ratio: 4.1 (calc) (ref <5.0)
Triglycerides: 211 mg/dL — ABNORMAL HIGH (ref <150)

## 2020-08-02 LAB — CBC WITH DIFFERENTIAL/PLATELET
Absolute Monocytes: 449 cells/uL (ref 200–950)
Basophils Absolute: 67 cells/uL (ref 0–200)
Basophils Relative: 1 %
Eosinophils Absolute: 87 cells/uL (ref 15–500)
Eosinophils Relative: 1.3 %
HCT: 45.6 % — ABNORMAL HIGH (ref 35.0–45.0)
Hemoglobin: 15.1 g/dL (ref 11.7–15.5)
Lymphs Abs: 2050 cells/uL (ref 850–3900)
MCH: 29.8 pg (ref 27.0–33.0)
MCHC: 33.1 g/dL (ref 32.0–36.0)
MCV: 90.1 fL (ref 80.0–100.0)
MPV: 12.6 fL — ABNORMAL HIGH (ref 7.5–12.5)
Monocytes Relative: 6.7 %
Neutro Abs: 4047 cells/uL (ref 1500–7800)
Neutrophils Relative %: 60.4 %
Platelets: 301 10*3/uL (ref 140–400)
RBC: 5.06 10*6/uL (ref 3.80–5.10)
RDW: 12.7 % (ref 11.0–15.0)
Total Lymphocyte: 30.6 %
WBC: 6.7 10*3/uL (ref 3.8–10.8)

## 2020-08-02 LAB — INSULIN, RANDOM: Insulin: 12.8 u[IU]/mL

## 2020-08-02 LAB — MAGNESIUM: Magnesium: 2.1 mg/dL (ref 1.5–2.5)

## 2020-08-02 LAB — TSH: TSH: 0.94 mIU/L (ref 0.40–4.50)

## 2020-08-02 LAB — VITAMIN D 25 HYDROXY (VIT D DEFICIENCY, FRACTURES): Vit D, 25-Hydroxy: 103 ng/mL — ABNORMAL HIGH (ref 30–100)

## 2020-08-03 NOTE — Progress Notes (Signed)
============================================================ -   Test results slightly outside the reference range are not unusual. If there is anything important, I will review this with you,  otherwise it is considered normal test values.  If you have further questions,  please do not hesitate to contact me at the office or via My Chart.  ============================================================ ============================================================  -  Total Chol = 152  &  LDL Chol = 84  - Both  Excellent   - Very low risk for Heart Attack  / Stroke ========================================================  - But Triglycerides (  211 ) or fats in blood are too high                 (goal is less than 150)    - Recommend avoid fried & greasy foods,  sweets / candy,   - Avoid white rice  (brown or wild rice or Quinoa is OK),   - Avoid white potatoes  (sweet potatoes are OK)   - Avoid anything made from white flour  - bagels, doughnuts, rolls, buns, biscuits, white and   wheat breads, pizza crust and traditional  pasta made of white flour & egg white  - (vegetarian pasta or spinach or wheat pasta is OK).    - Multi-grain bread is OK - like multi-grain flat bread or  sandwich thins.   - Avoid alcohol in excess.   - Exercise is also important. ============================================================ ============================================================  -  A1c = 6.0%   Blood sugar and A1c are  STILL elevated in the borderline and  early or pre-diabetes range which has the same   300% increased risk for heart attack, stroke, cancer and   alzheimer- type vascular dementia as full blown diabetes.   But the good news is that diet, exercise with  weight loss can cure the early diabetes at this point.  - Also recommend take                            Cinnamon 1,000 mg capsules   2 x /day  - as has been shown to help lower Blood sugar for many individuals   ============================================================ ============================================================  -  Insulin = 39   ( Normal is less than 20  !  ) - also elevated  and shows   - insulin resistance - a sign of early diabetes and associated with   a 300 % greater risk for heart attacks, strokes, cancer &   Alzheimer type vascular dementia   - All this can be cured  and prevented with losing weight - get Dr Fara Olden Fuhrman's book 'the End of Diabetes" and "the End of Dieting"   - and add many years of good health to your life. ============================================================ ============================================================  -

## 2020-08-12 DIAGNOSIS — M9901 Segmental and somatic dysfunction of cervical region: Secondary | ICD-10-CM | POA: Diagnosis not present

## 2020-08-12 DIAGNOSIS — Z711 Person with feared health complaint in whom no diagnosis is made: Secondary | ICD-10-CM | POA: Diagnosis not present

## 2020-08-12 DIAGNOSIS — Z03818 Encounter for observation for suspected exposure to other biological agents ruled out: Secondary | ICD-10-CM | POA: Diagnosis not present

## 2020-08-12 DIAGNOSIS — Z20822 Contact with and (suspected) exposure to covid-19: Secondary | ICD-10-CM | POA: Diagnosis not present

## 2020-08-23 ENCOUNTER — Other Ambulatory Visit: Payer: Self-pay | Admitting: Adult Health Nurse Practitioner

## 2020-09-02 ENCOUNTER — Other Ambulatory Visit: Payer: Self-pay | Admitting: Adult Health

## 2020-09-02 DIAGNOSIS — Z79899 Other long term (current) drug therapy: Secondary | ICD-10-CM

## 2020-09-03 ENCOUNTER — Other Ambulatory Visit: Payer: Self-pay | Admitting: Internal Medicine

## 2020-09-03 DIAGNOSIS — F411 Generalized anxiety disorder: Secondary | ICD-10-CM

## 2020-09-09 NOTE — Progress Notes (Signed)
Cardiology Office Note    Date:  09/10/2020   ID:  Gail Bailey, DOB 1960-07-21, MRN [id number]  PCP:  Unk Pinto, MD  Cardiologist:  Mertie Moores, MD  Electrophysiologist:  None   Chief Complaint: f/u testing  History of Present Illness:   Gail Bailey is a 60 y.o. 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   She originally established care with Dr. Acie Fredrickson for treatment of HTN. She had reported a cath in 1999 that was normal (records not available). She had a TIA in 2016. Neck CTA, head CT and brain MRI were unremarkable. Her echo did demonstrate EF 40-45% with apical and anteroseptal HK, moderate diastolic dysfunction. ASA and statin were started and her estrogen was stopped. Stress test was low risk in 2016. Cardiac MRI in 2017 demonstrated EF 59% without prior infarct or any concerning findings reported. She had a repeat stress test in 2018 which was also low risk with fixed septal defect c/w LBBB vs prior infarct, no ischemia, EF 53% with septal akinesis. F/u echo 05/2018 showed EF 50%, LBBB with mild septal hypokinesis, otherwise normal.   She was seen in clinic 04/2020 with constellation of issues including SOB, fatigue, chest pressure. She had recently checked her VS and had O2 sat 92% and BP in the low 90s with HR upper 40s/low 50s. We reduced her amlodipine and metoprolol. She continued to have fatigue so metoprolol was stopped.  2D echo showed no significant change from prior study - EF remained 50-55%, grade 1 DD, moderate LAE. Coronary CT showed elevated calcium score (93rd percentile) and mild aortic atherosclerosis with mild-moderate disease in the LAD/diagonal branches as well as mild plaque in the LCx - FFR study was reassuring.  She presents for follow-up today feeling good. She has  been feeling much better since last OV with the discontinuation of metoprolol. No recurrent episodes of BP dropping or low HR. 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 She has not had any syncope. She reports she is no longer on olmesartan and that primary care increased her amlodipine from '5mg'$  to '10mg'$  daily.   After the visit I reviewed primary care records further in depth and could not find an instruction to make the increase in amlodipine. I called her back on the phone to discuss and what we think happened is that her pharmacy refilled her amlodipine for the '10mg'$  dose the last time, so that is what she has been taking. She does not recall what happened to the olmesartan but confirmed in prior record she was taking this at last OV 04/2020.  Labwork independently reviewed: 07/2020 insulin 12.8, A1C 6.0, TSH wnl, trig 211, LDL 84, Mg wnl, Hgb 15.1, LFTs wnl  Past Medical History:  Diagnosis Date   Allergy    Anxiety    Asthma    Cardiomyopathy ([place])    a. Echo 8/16:  EF 40-45%, apical and ant-septal HK, Gr 2 DD   Diverticula, colon    Family history of adverse reaction to anesthesia    sisiter had PONV   Headache    rare migraines   History of cardiovascular stress test    Myoview 8/16:  EF 48%, anterior and apical defect c/w breast atten, no ischemia; Low Risk // Nuclear stress  test 9/18: Low risk stress nuclear study with fixed septal defect consistent with LBBB vs prior infarct; no ischemia; EF 53 with septal akinesis.   Hyperlipidemia    Hypertension    Irregular periods/menstrual cycles    LBBB (left bundle branch block)    Melanoma ([facility])    right arm   Seasonal allergies    TIA (transient ischemic attack)    Torn meniscus     Past Surgical History:  Procedure Laterality Date   ABLATION     BREAST REDUCTION SURGERY     CARDIAC CATHETERIZATION     CESAREAN SECTION     x2   KNEE SURGERY     partial knee replacement right    MELANOMA EXCISION WITH SENTINEL LYMPH NODE BIOPSY Right 09/15/2017   Procedure: BIOPSY RIGHT SHOULDER MOLE AND WIDE LOCAL EXCISION OF RIGHT ARM MELANOMA, ADVANCEMENT FLAP CLOSURE, SENTINEL LYMPH NODE BIOPSY  RIGHT ARM MELANOMA;  Surgeon: Stark Klein, MD;  Location: [facility];  Service: General;  Laterality: Right;   REDUCTION MAMMAPLASTY Bilateral    REPAIR QUADRICEPS / HAMSTRING MUSCLE     REPLACEMENT TOTAL KNEE Left 09/20/2018   Dr. Okey Dupre at [facility]     tubes in ears  10/12    Current Medications: Current Meds  Medication Sig   acyclovir ointment (ZOVIRAX) 5 % Apply 1 application topically every 3 (three) hours.   Adapalene-Benzoyl Peroxide (EPIDUO) 0.1-2.5 % gel Apply 1 application topically as needed (acne).   albuterol (VENTOLIN HFA) 108 (90 Base) MCG/ACT inhaler INHALE 2 PUFFS INTO LUNGS EVERY 4 HOURS AS NEEDED FOR WHEEZING OR SHORTNESS OF BREATH   amLODipine (NORVASC) 10 MG tablet Take 10 mg by mouth daily.   aspirin EC 81 MG tablet Take 1 tablet (81 mg total) by mouth daily.   Cholecalciferol (VITAMIN D PO) Take 5,000 Units by mouth daily.    escitalopram (LEXAPRO) 10 MG tablet TAKE 1 TABLET DAILY FOR MOOD & ANXIETY   estradiol (ESTRACE) 0.5 MG tablet Take 0.5 mg by mouth daily.   fluticasone (FLONASE) 50 MCG/ACT nasal spray SPRAY 2 SPRAYS INTO EACH NOSTRIL EVERY DAY   furosemide (LASIX) 40 MG tablet TAKE 1 TABLET EVERY DAY FOR BLOOD PRESSURE AND FLUID   ibuprofen (ADVIL) 800 MG tablet Take  1 tablet  3 x /day  with as needed for Pain & Inflammation   medroxyPROGESTERone (PROVERA) 2.5 MG tablet Take 2.5 mg by mouth daily.   montelukast (SINGULAIR) 10 MG tablet TAKE 1 TABLET BY MOUTH DAILY FOR ALLERGIES   Omega-3 Fatty Acids (FISH OIL PO) Take 1 capsule by mouth daily.   topiramate (TOPAMAX) 50 MG tablet TAKE 1/2-1 TABLET TWICE DAILY AT DINNER TIME & BEDTIME FOR DIETING & WEIGHT LOSS   valACYclovir (VALTREX) 500 MG tablet Take  1 tablet  2 x /day as  needed for Viral Suppression     Allergies:   Oxycodone   Social History   Socioeconomic History   Marital status: Married    Spouse name: Not on file   Number of children: 2   Years of education: Not on file   Highest education level: Not on file  Occupational History   Occupation: Chief Operating Officer: [employer]  Tobacco Use   Smoking status: Never   Smokeless tobacco: Never  Vaping Use   Vaping Use: Never used  Substance and Sexual Activity   Alcohol use: Yes    Comment: 1-2 glasses wine nightly  Drug use: No   Sexual activity: Not on file  Other Topics Concern   Not on file  Social History Narrative   Lives with husband and son   Social Determinants of Health   Financial Resource Strain: Not on file  Food Insecurity: Not on file  Transportation Needs: Not on file  Physical Activity: Not on file  Stress: Not on file  Social Connections: Not on file     Family History:  The patient's family history includes Asthma in her sister and son; Breast cancer in her maternal grandmother; Cancer in her maternal grandmother; Diabetes in her mother; Heart attack in her paternal grandmother; Heart attack (age of onset: 59) in her mother; Heart disease in her mother; Hypertension in her mother; Other in her father. There is no history of Colon cancer, Colon polyps, Esophageal cancer, Stomach cancer, or Rectal cancer.  ROS:   Please see the history of present illness. All other systems are reviewed and otherwise negative.    EKGs/Labs/Other Studies Reviewed:    Studies reviewed are outlined and summarized above. Reports included below if pertinent.  2D echo 05/20/20  1. Left ventricular ejection fraction, by estimation, is 50 to 55%. The  left ventricle has low normal function. The left ventricle has no regional  wall motion abnormalities. Left ventricular diastolic parameters are  consistent with Grade I diastolic  dysfunction (impaired relaxation). The  average left ventricular global  longitudinal strain is -17.5 %. The global longitudinal strain is normal.   2. Right ventricular systolic function is normal. The right ventricular  size is normal.   3. Left atrial size was moderately dilated.   4. The mitral valve is normal in structure. No evidence of mitral valve  regurgitation. No evidence of mitral stenosis.   5. The aortic valve is normal in structure. Aortic valve regurgitation is  not visualized. No aortic stenosis is present.   6. The inferior vena cava is normal in size with greater than 50%  respiratory variability, suggesting right atrial pressure of 3 mmHg.   Comparison(s): No significant change from prior study. Prior images  reviewed side by side.   Cor CT 04/2020   EXAM: Cardiac/Coronary  CT   TECHNIQUE: The patient was scanned on a [employer].   FINDINGS: A 120 kV prospective scan was triggered in the descending thoracic aorta at 111 HU's. Axial non-contrast 3 mm slices were carried out through the heart. The data set was analyzed on a dedicated work station and scored using the [place]. Gantry rotation speed was 250 msecs and collimation was .6 mm. No beta blockade and 0.8 mg of sl NTG was given. The 3D data set was reconstructed in 5% intervals of the 67-82 % of the R-R cycle. Diastolic phases were analyzed on a dedicated work station using MPR, MIP and VRT modes. The patient received 80 cc of contrast.   Aorta:  Normal size.  No calcifications.  No dissection.   Aortic Valve:  Trileaflet.  No calcifications.   Coronary Arteries:  Normal coronary origin.  Right dominance.   RCA is a large dominant artery that gives rise to PDA and PLVB. There is minimal calcified plaque in the proximal RCA with associated stenosis of 0-24%.   Left main is a large artery that gives rise to LAD and LCX arteries. There is no plaque.   LAD is a large vessel that gives rise to a large D1 and  large branching D2. There is at least  mild and possible moderate mixed plaque in the proximal to mid LAD at the takeoff of the D2 with associated stenosis of 25-49% but may be as high as 50-69%. There is mild to moderate calcified plaque in the proximal D1 with associated stenosis of at least 25-49% but may be as high as 50-69%.   LCX is a non-dominant artery that gives rise to moderate sized OM1 and OM2 branches. There is minimal calcified plaque in the mid LCx with associated stenosis of 0-24%.   Other findings:   Normal pulmonary vein drainage into the left atrium.   Normal let atrial appendage without a thrombus.   Normal size of the pulmonary artery.   IMPRESSION: 1. Coronary calcium score of 132. This was 93rd percentile for age and sex matched control.   2.  Normal coronary origin with right dominance.   3.  Mild atherosclerosis.  CAD RADs 2.   4.  Recommend preventive therapy and risk factor modification.   5.  This study has been submitted for FFR analysis.   Traci Turner 1. Left Main: FiNo significant stenosis. LM FFR 0.99   2. LAD: No significant stenosis. Proximal FFR = 0.95, Mid FFR = 0.92, Distal FFR = 0.89.   3. LCX: No significant stenosis. Proximal FFR = 0.99, Mid FFR = 0.99, Distal FFR = 0.96.   4. Ramus/High D1: No significant stenosis. Proximal FFR = 0.97, Mid FFR = 0.94, Distal FFR = 0.80   5. RCA: No significant stenosis. Proximal FFR = 0.99, Mid FFR = 0.95, Distal FFR = 0.87.   IMPRESSION: 1. Coronary CTA FFR flow analysis demonstrates no hemodynamically significant flow limiting lesions.   Fransico Him, MD     Electronically Signed   By: Fransico Him   On: 05/09/2020 16:40   IMPRESSION: 1. No significant incidental noncardiac findings are noted.  NST 09/2016 Nuclear stress EF: 53%. Defect 1: There is a large defect of severe severity present in the basal anteroseptal and mid anteroseptal location. This is a low risk study. The  left ventricular ejection fraction is mildly decreased (45-54%).   Low risk stress nuclear study with fixed septal defect consistent with LBBB vs prior infarct; no ischemia; EF 53 with septal akinesis.   2D echo 2020  1. The left ventricle has a visually estimated ejection fraction of 50%.  There was septal-lateral dyssynchrony consistent with LBBB. Mild septal  hypokinesis. The cavity size was normal. Left ventricular diastolic  Doppler parameters are consistent with  impaired relaxation.   2. The right ventricle has normal systolic function. The cavity was  normal. There is no increase in right ventricular wall thickness.   3. The aortic valve is tricuspid. No stenosis of the aortic valve.   4. The aortic root is normal in size and structure.   5. No evidence of mitral valve stenosis. No significant mitral  regurgitation.   6. Normal IVC size. No complete TR doppler jet so unable to estimate PA  systolic pressure.     EKG:  EKG is not ordered today  Recent Labs: 04/16/2020: NT-Pro BNP 158 08/01/2020: ALT 17; BUN 15; Creat 0.88; Hemoglobin 15.1; Magnesium 2.1; Platelets 301; Potassium 3.7; Sodium 138; TSH 0.94  Recent Lipid Panel    Component Value Date/Time   CHOL 152 08/01/2020 1123   CHOL 136 06/11/2020 0846   TRIG 211 (H) 08/01/2020 1123   HDL 37 (L) 08/01/2020 1123   HDL 31 (L) 06/11/2020 0846   CHOLHDL 4.1 08/01/2020 1123  VLDL 43 (H) 08/13/2016 1123   LDLCALC 84 08/01/2020 1123    PHYSICAL EXAM:    VS:  BP 118/76   Pulse 60   Ht 5' 0.5" (1.537 m)   Wt 138 lb 12.8 oz (63 kg)   SpO2 96%   BMI 26.66 kg/m   BMI: Body mass index is 26.66 kg/m.  GEN: Well nourished, well developed female in no acute distress HEENT: normocephalic, atraumatic Neck: no JVD, carotid bruits, or masses Cardiac: RRR; no murmurs, rubs, or gallops, no edema  Respiratory:  clear to auscultation bilaterally, normal work of breathing GI: soft, nontender, nondistended, + BS MS: no deformity  or atrophy Skin: warm and dry, no rash Neuro:  Alert and Oriented x 3, Strength and sensation are intact, follows commands Psych: euthymic mood, full affect  Wt Readings from Last 3 Encounters:  09/10/20 138 lb 12.8 oz (63 kg)  08/01/20 136 lb (61.7 kg)  04/16/20 139 lb 3.2 oz (63.1 kg)     ASSESSMENT & PLAN:   1. Nonobstructive CAD - continue risk factor modification with BP and lipid control. Last LDL was above goal at 84 (goal <70). She would like a trial of lifestyle modifications before titrating statin further. Will check fasting lipids in 2 months. If LDL is >70, would increase atorvastatin to '80mg'$  daily with recheck labs planned. Continue ASA. No BB due to bradycardia.  2. Mild NICM - symptoms of fatigue, bradycardia, low BP improved off metoprolol. As above, there was some confusion about her intended medication regimen after delving further into the record. I spoke with Ms. Cruse once she got home and she was able to confirm her regimen. What we think happened is that her pharmacy refilled her amlodipine for the '10mg'$  dose the last time, so that is what she has been taking. She's not quite sure what happened with the olmesartan. Given her history of cardiomyopathy, I do think it would be indicated to get her back on some form of ARB therapy. We will have her decrease her amlodipine back to '5mg'$  daily and restart olmesartan at '20mg'$  dose. She will check her BP daily over the next few days and send [place] in a message with her readings.   3. LBBB, sinus bradycardia - feeling better off metoprolol. Will add to intolerance lists so other providers are aware in the future. She will notify for any recurrent bradycardia  4. Essential HTN - see above regarding medication clarification. Will await her updated BP readings as above. If she develops any low BPs in the future, as previously suggested, I would continue to target amlodipine as the agent we should reduce rather than ARB given her history. The  goal is for her to continue to feel as good as she has the last few weeks. Of note, she previously had labs checked in 04/2020 while on ARB + Lasix and renal function/K were normal. If we need to titrate olmesartan in the future, she will need repeat BMET.  Disposition: F/u with Dr. Acie Fredrickson in 6 months.   Medication Adjustments/Labs and Tests Ordered: Current medicines are reviewed at length with the patient today.  Concerns regarding medicines are outlined above. Medication changes, Labs and Tests ordered today are summarized above and listed in the Patient Instructions accessible in Encounters.   Signed, Charlie Pitter, PA-C  09/10/2020 11:15 AM    [address], [place], [place]  [address] Phone: [phone]; Fax: [phone]

## 2020-09-10 ENCOUNTER — Ambulatory Visit: Payer: BC Managed Care – PPO | Admitting: Physician Assistant

## 2020-09-10 ENCOUNTER — Other Ambulatory Visit: Payer: Self-pay

## 2020-09-10 ENCOUNTER — Encounter: Payer: Self-pay | Admitting: Physician Assistant

## 2020-09-10 VITALS — BP 118/76 | HR 60 | Ht 60.5 in | Wt 138.8 lb

## 2020-09-10 DIAGNOSIS — I2510 Atherosclerotic heart disease of native coronary artery without angina pectoris: Secondary | ICD-10-CM

## 2020-09-10 DIAGNOSIS — I428 Other cardiomyopathies: Secondary | ICD-10-CM | POA: Diagnosis not present

## 2020-09-10 DIAGNOSIS — I10 Essential (primary) hypertension: Secondary | ICD-10-CM

## 2020-09-10 DIAGNOSIS — I447 Left bundle-branch block, unspecified: Secondary | ICD-10-CM | POA: Diagnosis not present

## 2020-09-10 DIAGNOSIS — R001 Bradycardia, unspecified: Secondary | ICD-10-CM

## 2020-09-10 MED ORDER — OLMESARTAN MEDOXOMIL 20 MG PO TABS: 20.0000 mg | ORAL_TABLET | Freq: Every day | ORAL | 1 refills | Status: DC

## 2020-09-10 MED ORDER — AMLODIPINE BESYLATE 5 MG PO TABS: 5.0000 mg | ORAL_TABLET | Freq: Every day | ORAL | 1 refills | Status: DC

## 2020-09-10 NOTE — Patient Instructions (Addendum)
Medication Instructions:   UPDATED INSTRUCTIONS AFTER OUR PHONE CALL Decrease amlodipine to '5mg'$  daily (you may use up your '10mg'$  tablets by cutting them in half, then pick up new prescription for the '5mg'$  dose taking 1 tablet daily) We will restart your olmesartan at '20mg'$  once daily  *If you need a refill on your cardiac medications before your next appointment, please call your pharmacy*   Lab Work: 2 MONTHS:  FASTING LIPID   If you have labs (blood work) drawn today and your tests are completely normal, you will receive your results only by: [place] (if you have MyChart) OR A paper copy in the mail If you have any lab test that is abnormal or we need to change your treatment, we will call you to review the results.   Testing/Procedures: None ordered   Follow-Up: At [facility], you and your health needs are our priority.  As part of our continuing mission to provide you with exceptional heart care, we have created designated Provider Care Teams.  These Care Teams include your primary Cardiologist (physician) and Advanced Practice Providers (APPs -  Physician Assistants and Nurse Practitioners) who all work together to provide you with the care you need, when you need it.  We recommend signing up for the patient portal called "MyChart".  Sign up information is provided on this After Visit Summary.  MyChart is used to connect with patients for Virtual Visits (Telemedicine).  Patients are able to view lab/test results, encounter notes, upcoming appointments, etc.  Non-urgent messages can be sent to your provider as well.   To learn more about what you can do with MyChart, go to [email or web address].    Your next appointment:   6 month(s)  The format for your next appointment:   In Person  Provider:   You may see Mertie Moores, MD or one of the following Advanced Practice Providers on your designated Care Team:   Richardson Dopp, PA-C [place], [place]   Other  Instructions  UPDATED INSTRUCTIONS AFTER OUR PHONE CALL We need to get a better idea of what your blood pressure is running at home. Here are some instructions to follow: - I would recommend using a blood pressure cuff that goes on your arm. The wrist ones can be inaccurate. If you're purchasing one for the first time, try to select one that also reports your heart rate because this can be helpful information as well. - To check your blood pressure, choose a time at least 3 hours after taking your blood pressure medicines. If you can sample it at different times of the day, that's great - it might give you more information about how your blood pressure fluctuates. Remain seated in a chair for 5 minutes quietly beforehand, then check it.  - Please record a list of those readings and call us/send in MyChart message with them for our review in several days.

## 2020-09-27 DIAGNOSIS — H18521 Epithelial (juvenile) corneal dystrophy, right eye: Secondary | ICD-10-CM | POA: Diagnosis not present

## 2020-10-11 DIAGNOSIS — C4362 Malignant melanoma of left upper limb, including shoulder: Secondary | ICD-10-CM | POA: Diagnosis not present

## 2020-10-15 ENCOUNTER — Other Ambulatory Visit: Payer: BC Managed Care – PPO

## 2020-10-17 DIAGNOSIS — M9901 Segmental and somatic dysfunction of cervical region: Secondary | ICD-10-CM | POA: Diagnosis not present

## 2020-10-30 DIAGNOSIS — M9901 Segmental and somatic dysfunction of cervical region: Secondary | ICD-10-CM | POA: Diagnosis not present

## 2020-11-04 DIAGNOSIS — H18522 Epithelial (juvenile) corneal dystrophy, left eye: Secondary | ICD-10-CM | POA: Diagnosis not present

## 2020-11-13 ENCOUNTER — Other Ambulatory Visit: Payer: BC Managed Care – PPO | Admitting: *Deleted

## 2020-11-13 ENCOUNTER — Other Ambulatory Visit: Payer: Self-pay

## 2020-11-13 DIAGNOSIS — I2510 Atherosclerotic heart disease of native coronary artery without angina pectoris: Secondary | ICD-10-CM | POA: Diagnosis not present

## 2020-11-13 DIAGNOSIS — R001 Bradycardia, unspecified: Secondary | ICD-10-CM

## 2020-11-13 DIAGNOSIS — I447 Left bundle-branch block, unspecified: Secondary | ICD-10-CM

## 2020-11-13 DIAGNOSIS — I10 Essential (primary) hypertension: Secondary | ICD-10-CM

## 2020-11-13 DIAGNOSIS — I428 Other cardiomyopathies: Secondary | ICD-10-CM

## 2020-11-13 LAB — LIPID PANEL
Chol/HDL Ratio: 3.3 ratio (ref 0.0–4.4)
Cholesterol, Total: 126 mg/dL (ref 100–199)
HDL: 38 mg/dL — ABNORMAL LOW (ref >39)
LDL Chol Calc (NIH): 60 mg/dL (ref 0–99)
Triglycerides: 165 mg/dL — ABNORMAL HIGH (ref 0–149)
VLDL Cholesterol Cal: 28 mg/dL (ref 5–40)

## 2020-11-21 DIAGNOSIS — D2272 Melanocytic nevi of left lower limb, including hip: Secondary | ICD-10-CM | POA: Diagnosis not present

## 2020-11-21 DIAGNOSIS — D239 Other benign neoplasm of skin, unspecified: Secondary | ICD-10-CM | POA: Diagnosis not present

## 2020-11-21 DIAGNOSIS — D2372 Other benign neoplasm of skin of left lower limb, including hip: Secondary | ICD-10-CM | POA: Diagnosis not present

## 2020-11-21 DIAGNOSIS — Z86018 Personal history of other benign neoplasm: Secondary | ICD-10-CM | POA: Diagnosis not present

## 2020-11-30 ENCOUNTER — Other Ambulatory Visit: Payer: Self-pay | Admitting: Adult Health

## 2020-11-30 DIAGNOSIS — J069 Acute upper respiratory infection, unspecified: Secondary | ICD-10-CM | POA: Diagnosis not present

## 2020-11-30 DIAGNOSIS — Z20822 Contact with and (suspected) exposure to covid-19: Secondary | ICD-10-CM | POA: Diagnosis not present

## 2020-12-09 ENCOUNTER — Other Ambulatory Visit: Payer: Self-pay

## 2020-12-09 ENCOUNTER — Ambulatory Visit (INDEPENDENT_AMBULATORY_CARE_PROVIDER_SITE_OTHER): Payer: BC Managed Care – PPO | Admitting: Internal Medicine

## 2020-12-09 ENCOUNTER — Encounter: Payer: Self-pay | Admitting: Internal Medicine

## 2020-12-09 VITALS — BP 116/70 | HR 56 | Temp 98.0°F | Resp 17 | Ht 60.05 in | Wt 139.2 lb

## 2020-12-09 DIAGNOSIS — J0410 Acute tracheitis without obstruction: Secondary | ICD-10-CM | POA: Diagnosis not present

## 2020-12-09 DIAGNOSIS — G4483 Primary cough headache: Secondary | ICD-10-CM | POA: Diagnosis not present

## 2020-12-09 DIAGNOSIS — Z23 Encounter for immunization: Secondary | ICD-10-CM | POA: Diagnosis not present

## 2020-12-09 DIAGNOSIS — J0140 Acute pansinusitis, unspecified: Secondary | ICD-10-CM | POA: Diagnosis not present

## 2020-12-09 LAB — POC INFLUENZA A&B (BINAX/QUICKVUE)
Influenza A, POC: NEGATIVE
Influenza B, POC: NEGATIVE

## 2020-12-09 LAB — POC COVID19 BINAXNOW: SARS Coronavirus 2 Ag: NEGATIVE

## 2020-12-09 MED ORDER — PSEUDOEPHEDRINE HCL ER 120 MG PO TB12: ORAL_TABLET | ORAL | 3 refills | Status: DC

## 2020-12-09 MED ORDER — DEXAMETHASONE 4 MG PO TABS: ORAL_TABLET | ORAL | 2 refills | Status: DC

## 2020-12-09 MED ORDER — AZITHROMYCIN 250 MG PO TABS: ORAL_TABLET | ORAL | 1 refills | Status: DC

## 2020-12-09 NOTE — Progress Notes (Signed)
Future Appointments  Date Time Provider Department  01/02/2021 11:00 AM Unk Pinto, MD [facility]  03/12/2021  9:00 AM Nahser, Wonda Cheng, MD [facility]    History of Present Illness:     Patient is a very nice 60 yo MWF with hx/ Allergies & Asthma who presents with a 2 week hx/o increasing head congestion & cough productive of a yellow sputum.  No fevers, chills, sweats, rashes  or dyspnea.  Covid & Flu tests Negative today.  Medications  Current Outpatient Medications (Endocrine & Metabolic):    dexamethasone (DECADRON) 4 MG tablet, Take 1 tab 3 x day - 3 days, then 2 x day - 3 days, then 1 tab daily   estradiol (ESTRACE) 0.5 MG tablet, Take 0.5 mg by mouth daily.   medroxyPROGESTERone (PROVERA) 2.5 MG tablet, Take 2.5 mg by mouth daily.  Current Outpatient Medications (Cardiovascular):    amLODipine (NORVASC) 5 MG tablet, Take 1 tablet (5 mg total) by mouth daily.   furosemide (LASIX) 40 MG tablet, TAKE 1 TABLET EVERY DAY FOR BLOOD PRESSURE AND FLUID   olmesartan (BENICAR) 20 MG tablet, Take 1 tablet (20 mg total) by mouth daily.   atorvastatin (LIPITOR) 40 MG tablet, Take 1 tablet (40 mg total) by mouth daily.  Current Outpatient Medications (Respiratory):    albuterol (VENTOLIN HFA) 108 (90 Base) MCG/ACT inhaler, INHALE 2 PUFFS INTO LUNGS EVERY 4 HOURS AS NEEDED FOR WHEEZING OR SHORTNESS OF BREATH   fluticasone (FLONASE) 50 MCG/ACT nasal spray, SPRAY 2 SPRAYS INTO EACH NOSTRIL EVERY DAY   montelukast (SINGULAIR) 10 MG tablet, TAKE 1 TABLET BY MOUTH DAILY FOR ALLERGIES   pseudoephedrine (SUDAFED) 120 MG 12 hr tablet, Take  1 tablet  2 x /day (every 12 hours)  for Sinus & Chest Congestion  Current Outpatient Medications (Analgesics):    aspirin EC 81 MG tablet, Take 1 tablet (81 mg total) by mouth daily.   ibuprofen (ADVIL) 800 MG tablet, Take  1 tablet  3 x /day  with as needed for Pain & Inflammation   Current Outpatient Medications (Other):    acyclovir ointment  (ZOVIRAX) 5 %, Apply 1 application topically every 3 (three) hours.   Adapalene-Benzoyl Peroxide (EPIDUO) 0.1-2.5 % gel, Apply 1 application topically as needed (acne).   azithromycin (ZITHROMAX) 250 MG tablet, Take 2 tablets with Food on  Day 1, then 1 tablet Daily with Food for Sinusitis / Bronchitis   Cholecalciferol (VITAMIN D PO), Take 5,000 Units by mouth daily.    escitalopram (LEXAPRO) 10 MG tablet, TAKE 1 TABLET DAILY FOR MOOD & ANXIETY   Omega-3 Fatty Acids (FISH OIL PO), Take 1 capsule by mouth daily.   topiramate (TOPAMAX) 50 MG tablet, TAKE 1/2-1 TABLET TWICE DAILY AT DINNER TIME & BEDTIME FOR DIETING & WEIGHT LOSS   valACYclovir (VALTREX) 500 MG tablet, Take  1 tablet  2 x /day as needed for Viral Suppression  Problem list She has HTN; Hyperlipidemia; Vitamin D deficiency; Asthma; Medication management; History of TIA (transient ischemic attack); Other abnormal glucose; BMI 25.0-25.9,adult; Generalized anxiety disorder; Melanoma of left upper arm ([place]); Bruxism, sleep-related; Snoring; Chronic fatigue; Non-restorative sleep; Chronic systolic congestive heart failure, NYHA class 2 ([place]); Mild sleep apnea (recommended oral device); and 2019 novel coronavirus disease (COVID-19) on their problem list.   Observations/Objective:  BP 116/70   Pulse (!) 56   Temp 98 F (36.7 C)   Resp 17   Ht 5' 0.05" (1.525 m)   Wt 139 lb 3.2  oz (63.1 kg)   SpO2 96%   BMI 27.14 kg/m   Dry cough . No stridor. No cyanosis.   HEENT -  TMs sl retracted AU. (+) [place] - Maxillary tenderness. N/O/P - clear.  Neck - supple.  Chest - Few dry rales . No wheezes, rales or  rhonchi.  Cor - Nl HS. RRR w/o sig MGR. PP 1(+). No edema. MS- FROM w/o deformities.  Gait Nl. Neuro -  Nl w/o focal abnormalities.   Assessment and Plan:  1. Acute non-recurrent pansinusitis  - dexamethasone 4 MG tablet;  Take 1 tab 3 x day - 3 days, then 2 x day - 3 days, then 1 tab daily   Dispense: 20 tablet; Refill:  2  - pseudoephedrine  120 MG 12 hr tablet;  Take  1 tablet  2 x /day (every 12 hours)    - azithromycin 250 MG tablet;  Take 2 tablets with Food on  Day 1, then 1 tablet Daily  Dispense: 6 each; Refill: 1  2. Tracheitis  - dexamethasone  4 MG tablet;  Take 1 tab 3 x day - 3 days, then 2 x day - 3 days, then 1 tab daily   Dispense: 20 tablet; Refill: 2  - azithromycin 250 MG tablet;  Take 2 tablets with Food on  Day 1, then 1 tablet Daily   Dispense: 6 each; Refill: 1  3. Cough headache  - POC COVID-19 - POC Influenza A&B (Binax test)  4. Need for immunization against influenza  - Flu Vaccine QUAD 6+ mos PF IM (Fluarix Quad PF)   Follow Up Instructions:        I discussed the assessment and treatment plan with the patient. The patient was provided an opportunity to ask questions and all were answered. The patient agreed with the plan and demonstrated an understanding of the instructions.       The patient was advised to call back or seek an in-person evaluation if the symptoms worsen or if the condition fails to improve as anticipated.    Kirtland Bouchard, MD

## 2020-12-25 ENCOUNTER — Other Ambulatory Visit: Payer: Self-pay | Admitting: Internal Medicine

## 2020-12-25 DIAGNOSIS — I10 Essential (primary) hypertension: Secondary | ICD-10-CM

## 2021-01-01 ENCOUNTER — Encounter: Payer: Self-pay | Admitting: Internal Medicine

## 2021-01-01 NOTE — Patient Instructions (Signed)
Due to recent changes in healthcare laws, you may see the results of your imaging and laboratory studies on MyChart before your provider has had a chance to review them.  We understand that in some cases there may be results that are confusing or concerning to you. Not all laboratory results come back in the same time frame and the provider may be waiting for multiple results in order to interpret others.  Please give [place] 48 hours in order for your provider to thoroughly review all the results before contacting the office for clarification of your results.   ++++++++++++++++++++++++++++++  Vit D  & Vit C 1,000 mg   are recommended to help protect  against the Covid-19 and other Corona viruses.    Also it's recommended  to take  Zinc 50 mg  to help  protect against the Covid-19   and best place to get  is also on [email or web address]  and don't pay more than 6-8 cents /pill !  ================================ Coronavirus (COVID-19) Are you at risk?  Are you at risk for the Coronavirus (COVID-19)?  To be considered HIGH RISK for Coronavirus (COVID-19), you have to meet the following criteria:  Traveled to [place], [place], [place], [place] or [place]; or in the [place] to [place], [place], [place]  or [place]; and have fever, cough, and shortness of breath within the last 2 weeks of travel OR Been in close contact with a person diagnosed with COVID-19 within the last 2 weeks and have  fever, cough,and shortness of breath  IF YOU DO NOT MEET THESE CRITERIA, YOU ARE CONSIDERED LOW RISK FOR COVID-19.  What to do if you are HIGH RISK for COVID-19?  If you are having a medical emergency, call 911. Seek medical care right away. Before you go to a doctors office, urgent care or emergency department,  call ahead and tell them about your recent travel, contact with someone diagnosed with COVID-19   and your symptoms.  You should receive instructions from your physicians office regarding  next steps of care.  When you arrive at healthcare provider, tell the healthcare staff immediately you have returned from  visiting [place], [place], [place], [place] or [place]; or traveled in the [place] to [place], [place],  [place] or [place] in the last two weeks or you have been in close contact with a person diagnosed with  COVID-19 in the last 2 weeks.   Tell the health care staff about your symptoms: fever, cough and shortness of breath. After you have been seen by a medical provider, you will be either: Tested for (COVID-19) and discharged home on quarantine except to seek medical care if  symptoms worsen, and asked to  Stay home and avoid contact with others until you get your results (4-5 days)  Avoid travel on public transportation if possible (such as bus, train, or airplane) or Sent to the Emergency Department by EMS for evaluation, COVID-19 testing  and  possible admission depending on your condition and test results.  What to do if you are LOW RISK for COVID-19?  Reduce your risk of any infection by using the same precautions used for avoiding the common cold or flu:  Wash your hands often with soap and warm water for at least 20 seconds.  If soap and water are not readily available,  use an alcohol-based hand sanitizer with at least 60% alcohol.  If coughing or sneezing, cover your mouth and nose by  or sneezing into the elbow areas of your shirt or coat,  into a tissue or into your sleeve (not your hands). Avoid shaking hands with others and consider head nods or verbal greetings only. Avoid touching your eyes, nose, or mouth with unwashed hands.  Avoid close contact with people who are sick. Avoid places or events with large numbers of people in one location, like concerts or sporting events. Carefully consider travel plans you have or are making. If you are planning any travel outside or inside the [place], visit the CDC's Travelers' Health webpage for  the latest health notices. If you have some symptoms but not all symptoms, continue to monitor at home and seek medical attention  if your symptoms worsen. If you are having a medical emergency, call 911. >>>>>>>>>>>>>>>>>>>>>>> Preventive Care for Adults  A healthy lifestyle and preventive care can promote health and wellness. Preventive health guidelines for women include the following key practices. A routine yearly physical is a good way to check with your health care provider about your health and preventive screening. It is a chance to share any concerns and updates on your health and to receive a thorough exam. Visit your dentist for a routine exam and preventive care every 6 months. Brush your teeth twice a day and floss once a day. Good oral hygiene prevents tooth decay and gum disease. The frequency of eye exams is based on your age, health, family medical history, use of contact lenses, and other factors. Follow your health care provider's recommendations for frequency of eye exams. Eat a healthy diet. Foods like vegetables, fruits, whole grains, low-fat dairy products, and lean protein foods contain the nutrients you need without too many calories. Decrease your intake of foods high in solid fats, added sugars, and salt. Eat the right amount of calories for you. Get information about a proper diet from your health care provider, if necessary. Regular physical exercise is one of the most important things you can do for your health. Most adults should get at least 150 minutes of moderate-intensity exercise (any activity that increases your heart rate and causes you to sweat) each week. In addition, most adults need muscle-strengthening exercises on 2 or more days a week. Maintain a healthy weight. The body mass index (BMI) is a screening tool to identify possible weight problems. It provides an estimate of body fat based on height and weight. Your health care provider can find your BMI and can  help you achieve or maintain a healthy weight. For adults 20 years and older: A BMI below 18.5 is considered underweight. A BMI of 18.5 to 24.9 is normal. A BMI of 25 to 29.9 is considered overweight. A BMI of 30 and above is considered obese. Maintain normal blood lipids and cholesterol levels by exercising and minimizing your intake of saturated fat. Eat a balanced diet with plenty of fruit and vegetables. Blood tests for lipids and cholesterol should begin at age 20 and be repeated every 5 years. If your lipid or cholesterol levels are high, you are over 50, or you are at high risk for heart disease, you may need your cholesterol levels checked more frequently. Ongoing high lipid and cholesterol levels should be treated with medicines if diet and exercise are not working. If you smoke, find out from your health care provider how to quit. If you do not use tobacco, do not start. Lung cancer screening is recommended for adults aged 55-80 years who are at high risk for developing   developing lung cancer because of a history of smoking. A yearly low-dose CT scan of the lungs is recommended for people who have at least a 30-pack-year history of smoking and are a current smoker or have quit within the past 15 years. A pack year of smoking is smoking an average of 1 pack of cigarettes a day for 1 year (for example: 1 pack a day for 30 years or 2 packs a day for 15 years). Yearly screening should continue until the smoker has stopped smoking for at least 15 years. Yearly screening should be stopped for people who develop a health problem that would prevent them from having lung cancer treatment. High blood pressure causes heart disease and increases the risk of stroke. Your blood pressure should be checked at least every 1 to 2 years. Ongoing high blood pressure should be treated with medicines if weight loss and exercise do not work. If you are 67-36 years old, ask your health care provider if you should take aspirin to  prevent strokes. Diabetes screening involves taking a blood sample to check your fasting blood sugar level. This should be done once every 3 years, after age 84, if you are within normal weight and without risk factors for diabetes. Testing should be considered at a younger age or be carried out more frequently if you are overweight and have at least 1 risk factor for diabetes. Breast cancer screening is essential preventive care for women. You should practice "breast self-awareness." This means understanding the normal appearance and feel of your breasts and may include breast self-examination. Any changes detected, no matter how small, should be reported to a health care provider. Women in their 67s and 30s should have a clinical breast exam (CBE) by a health care provider as part of a regular health exam every 1 to 3 years. After age 53, women should have a CBE every year. Starting at age 68, women should consider having a mammogram (breast X-ray test) every year. Women who have a family history of breast cancer should talk to their health care provider about genetic screening. Women at a high risk of breast cancer should talk to their health care providers about having an MRI and a mammogram every year. Breast cancer gene (BRCA)-related cancer risk assessment is recommended for women who have family members with BRCA-related cancers. BRCA-related cancers include breast, ovarian, tubal, and peritoneal cancers. Having family members with these cancers may be associated with an increased risk for harmful changes (mutations) in the breast cancer genes BRCA1 and BRCA2. Results of the assessment will determine the need for genetic counseling and BRCA1 and BRCA2 testing. Routine pelvic exams to screen for cancer are no longer recommended for nonpregnant women who are considered low risk for cancer of the pelvic organs (ovaries, uterus, and vagina) and who do not have symptoms. Ask your health care provider if a  screening pelvic exam is right for you. If you have had past treatment for cervical cancer or a condition that could lead to cancer, you need Pap tests and screening for cancer for at least 20 years after your treatment. If Pap tests have been discontinued, your risk factors (such as having a new sexual partner) need to be reassessed to determine if screening should be resumed. Some women have medical problems that increase the chance of getting cervical cancer. In these cases, your health care provider may recommend more frequent screening and Pap tests. Colorectal cancer can be detected and often prevented. Most routine  colorectal cancer screening begins at the age of 48 years and continues through age 29 years. However, your health care provider may recommend screening at an earlier age if you have risk factors for colon cancer. On a yearly basis, your health care provider may provide home test kits to check for hidden blood in the stool. Use of a small camera at the end of a tube, to directly examine the colon (sigmoidoscopy or colonoscopy), can detect the earliest forms of colorectal cancer. Talk to your health care provider about this at age 12, when routine screening begins.  Direct exam of the colon should be repeated every 5-10 years through age 26 years, unless early forms of pre-cancerous polyps or small growths are found. Hepatitis C blood testing is recommended for all people born from [birth year] through 1965 and any individual with known risks for hepatitis C.  Osteoporosis is a disease in which the bones lose minerals and strength with aging. This can result in serious bone fractures or breaks. The risk of osteoporosis can be identified using a bone density scan. Women ages 30 years and over and women at risk for fractures or osteoporosis should discuss screening with their health care providers. Ask your health care provider whether you should take a calcium supplement or vitamin D to reduce the rate  of osteoporosis. Menopause can be associated with physical symptoms and risks. Hormone replacement therapy is available to decrease symptoms and risks. You should talk to your health care provider about whether hormone replacement therapy is right for you. Use sunscreen. Apply sunscreen liberally and repeatedly throughout the day. You should seek shade when your shadow is shorter than you. Protect yourself by wearing long sleeves, pants, a wide-brimmed hat, and sunglasses year round, whenever you are outdoors. Once a month, do a whole body skin exam, using a mirror to look at the skin on your back. Tell your health care provider of new moles, moles that have irregular borders, moles that are larger than a pencil eraser, or moles that have changed in shape or color. Stay current with required vaccines (immunizations). Influenza vaccine. All adults should be immunized every year. Tetanus, diphtheria, and acellular pertussis (Td, Tdap) vaccine. Pregnant women should receive 1 dose of Tdap vaccine during each pregnancy. The dose should be obtained regardless of the length of time since the last dose. Immunization is preferred during the 27th-36th week of gestation. An adult who has not previously received Tdap or who does not know her vaccine status should receive 1 dose of Tdap. This initial dose should be followed by tetanus and diphtheria toxoids (Td) booster doses every 10 years. Adults with an unknown or incomplete history of completing a 3-dose immunization series with Td-containing vaccines should begin or complete a primary immunization series including a Tdap dose. Adults should receive a Td booster every 10 years. Varicella vaccine. An adult without evidence of immunity to varicella should receive 2 doses or a second dose if she has previously received 1 dose. Pregnant females who do not have evidence of immunity should receive the first dose after pregnancy. This first dose should be obtained before  leaving the health care facility. The second dose should be obtained 4-8 weeks after the first dose. Human papillomavirus (HPV) vaccine. Females aged 13-26 years who have not received the vaccine previously should obtain the 3-dose series. The vaccine is not recommended for use in pregnant females. However, pregnancy testing is not needed before receiving a dose. If a female is found  be pregnant after receiving a dose, no treatment is needed. In that case, the remaining doses should be delayed until after the pregnancy. Immunization is recommended for any person with an immunocompromised condition through the age of 26 years if she did not get any or all doses earlier. During the 3-dose series, the second dose should be obtained 4-8 weeks after the first dose. The third dose should be obtained 24 weeks after the first dose and 16 weeks after the second dose. Zoster vaccine. One dose is recommended for adults aged 60 years or older unless certain conditions are present. Measles, mumps, and rubella (MMR) vaccine. Adults born before 1957 generally are considered immune to measles and mumps. Adults born in 1957 or later should have 1 or more doses of MMR vaccine unless there is a contraindication to the vaccine or there is laboratory evidence of immunity to each of the three diseases. A routine second dose of MMR vaccine should be obtained at least 28 days after the first dose for students attending postsecondary schools, health care workers, or international travelers. People who received inactivated measles vaccine or an unknown type of measles vaccine during 1963-1967 should receive 2 doses of MMR vaccine. People who received inactivated mumps vaccine or an unknown type of mumps vaccine before 1979 and are at high risk for mumps infection should consider immunization with 2 doses of MMR vaccine. For females of childbearing age, rubella immunity should be determined. If there is no evidence of immunity, females  who are not pregnant should be vaccinated. If there is no evidence of immunity, females who are pregnant should delay immunization until after pregnancy. Unvaccinated health care workers born before 1957 who lack laboratory evidence of measles, mumps, or rubella immunity or laboratory confirmation of disease should consider measles and mumps immunization with 2 doses of MMR vaccine or rubella immunization with 1 dose of MMR vaccine. Pneumococcal 13-valent conjugate (PCV13) vaccine. When indicated, a person who is uncertain of her immunization history and has no record of immunization should receive the PCV13 vaccine. An adult aged 19 years or older who has certain medical conditions and has not been previously immunized should receive 1 dose of PCV13 vaccine. This PCV13 should be followed with a dose of pneumococcal polysaccharide (PPSV23) vaccine. The PPSV23 vaccine dose should be obtained at least 1 or more year(s) after the dose of PCV13 vaccine. An adult aged 19 years or older who has certain medical conditions and previously received 1 or more doses of PPSV23 vaccine should receive 1 dose of PCV13. The PCV13 vaccine dose should be obtained 1 or more years after the last PPSV23 vaccine dose.  Pneumococcal polysaccharide (PPSV23) vaccine. When PCV13 is also indicated, PCV13 should be obtained first. All adults aged 65 years and older should be immunized. An adult younger than age 65 years who has certain medical conditions should be immunized. Any person who resides in a nursing home or long-term care facility should be immunized. An adult smoker should be immunized. People with an immunocompromised condition and certain other conditions should receive both PCV13 and PPSV23 vaccines. People with human immunodeficiency virus (HIV) infection should be immunized as soon as possible after diagnosis. Immunization during chemotherapy or radiation therapy should be avoided. Routine use of PPSV23 vaccine is not  recommended for American Indians, [place] Natives, or people younger than 65 years unless there are medical conditions that require PPSV23 vaccine. When indicated, people who have unknown immunization and have no record of immunization should receive   PPSV23 vaccine. One-time revaccination 5 years after the first dose of PPSV23 is recommended for people aged 19-64 years who have chronic kidney failure, nephrotic syndrome, asplenia, or immunocompromised conditions. People who received 1-2 doses of PPSV23 before age 65 years should receive another dose of PPSV23 vaccine at age 65 years or later if at least 5 years have passed since the previous dose. Doses of PPSV23 are not needed for people immunized with PPSV23 at or after age 65 years.  Preventive Services / Frequency  Ages 40 to 64 years Blood pressure check. Lipid and cholesterol check. Lung cancer screening. / Every year if you are aged 55-80 years and have a 30-pack-year history of smoking and currently smoke or have quit within the past 15 years. Yearly screening is stopped once you have quit smoking for at least 15 years or develop a health problem that would prevent you from having lung cancer treatment. Clinical breast exam.** / Every year after age 40 years.  BRCA-related cancer risk assessment.** / For women who have family members with a BRCA-related cancer (breast, ovarian, tubal, or peritoneal cancers). Mammogram.** / Every year beginning at age 40 years and continuing for as long as you are in good health. Consult with your health care provider. Pap test.** / Every 3 years starting at age 30 years through age 65 or 70 years with a history of 3 consecutive normal Pap tests. HPV screening.** / Every 3 years from ages 30 years through ages 65 to 70 years with a history of 3 consecutive normal Pap tests. Fecal occult blood test (FOBT) of stool. / Every year beginning at age 50 years and continuing until age 75 years. You may not need to do this  test if you get a colonoscopy every 10 years. Flexible sigmoidoscopy or colonoscopy.** / Every 5 years for a flexible sigmoidoscopy or every 10 years for a colonoscopy beginning at age 50 years and continuing until age 75 years. Hepatitis C blood test.** / For all people born from 1945 through 1965 and any individual with known risks for hepatitis C. Skin self-exam. / Monthly. Influenza vaccine. / Every year. Tetanus, diphtheria, and acellular pertussis (Tdap/Td) vaccine.** / Consult your health care provider. Pregnant women should receive 1 dose of Tdap vaccine during each pregnancy. 1 dose of Td every 10 years. Varicella vaccine.** / Consult your health care provider. Pregnant females who do not have evidence of immunity should receive the first dose after pregnancy. Zoster vaccine.** / 1 dose for adults aged 60 years or older. Pneumococcal 13-valent conjugate (PCV13) vaccine.** / Consult your health care provider. Pneumococcal polysaccharide (PPSV23) vaccine.** / 1 to 2 doses if you smoke cigarettes or if you have certain conditions. Meningococcal vaccine.** / Consult your health care provider. Hepatitis A vaccine.** / Consult your health care provider. Hepatitis B vaccine.** / Consult your health care provider. Screening for abdominal aortic aneurysm (AAA)  by ultrasound is recommended for people over 50 who have history of high blood pressure or who are current or former smokers. ++++++++++++++++++ Recommend Adult Low Dose Aspirin or  coated  Aspirin 81 mg daily  To reduce risk of Colon Cancer 40 %,  Skin Cancer 26 % ,  Melanoma 46%  and  Pancreatic cancer 60% +++++++++++++++++++ Vitamin D goal  is between 70-100.  Please make sure that you are taking your Vitamin D as directed.  It is very important as a natural anti-inflammatory  helping hair, skin, and nails, as well as reducing stroke and heart   attack risk.  It helps your bones and helps with mood. It also decreases numerous  cancer risks so please take it as directed.  Low Vit D is associated with a 200-300% higher risk for CANCER  and 200-300% higher risk for HEART   ATTACK  &  STROKE.   ...................................... It is also associated with higher death rate at younger ages,  autoimmune diseases like Rheumatoid arthritis, Lupus, Multiple Sclerosis.    Also many other serious conditions, like depression, Alzheimer's Dementia, infertility, muscle aches, fatigue, fibromyalgia - just to name a few. ++++++++++++++++++ Recommend the book "The END of DIETING" by Dr Joel Fuhrman  & the book "The END of DIABETES " by Dr Joel Fuhrman At [employer] - get book & Audio CD's    Being diabetic has a  300% increased risk for heart attack, stroke, cancer, and alzheimer- type vascular dementia. It is very important that you work harder with diet by avoiding all foods that are white. Avoid white rice (brown & wild rice is OK), white potatoes (sweetpotatoes in moderation is OK), White bread or wheat bread or anything made out of white flour like bagels, donuts, rolls, buns, biscuits, cakes, pastries, cookies, pizza crust, and pasta (made from white flour & egg whites) - vegetarian pasta or spinach or wheat pasta is OK. Multigrain breads like Arnold's or [employer], or multigrain sandwich thins or flatbreads.  Diet, exercise and weight loss can reverse and cure diabetes in the early stages.  Diet, exercise and weight loss is very important in the control and prevention of complications of diabetes which affects every system in your body, ie. Brain - dementia/stroke, eyes - glaucoma/blindness, heart - heart attack/heart failure, kidneys - dialysis, stomach - gastric paralysis, intestines - malabsorption, nerves - severe painful neuritis, circulation - gangrene & loss of a leg(s), and finally cancer and Alzheimers.    I recommend avoid fried & greasy foods,  sweets/candy, white rice (brown or wild rice or Quinoa is OK), white  potatoes (sweet potatoes are OK) - anything made from white flour - bagels, doughnuts, rolls, buns, biscuits,white and wheat breads, pizza crust and traditional pasta made of white flour & egg white(vegetarian pasta or spinach or wheat pasta is OK).  Multi-grain bread is OK - like multi-grain flat bread or sandwich thins. Avoid alcohol in excess. Exercise is also important.    Eat all the vegetables you want - avoid meat, especially red meat and dairy - especially cheese.  Cheese is the most concentrated form of trans-fats which is the worst thing to clog up our arteries. Veggie cheese is OK which can be found in the fresh produce section at [employer] or [employer] or [employer]  ++++++++++++++++++++++ DASH Eating Plan  DASH stands for "Dietary Approaches to Stop Hypertension."   The DASH eating plan is a healthy eating plan that has been shown to reduce high blood pressure (hypertension). Additional health benefits may include reducing the risk of type 2 diabetes mellitus, heart disease, and stroke. The DASH eating plan may also help with weight loss. WHAT DO I NEED TO KNOW ABOUT THE DASH EATING PLAN? For the DASH eating plan, you will follow these general guidelines: Choose foods with a percent daily value for sodium of less than 5% (as listed on the food label). Use salt-free seasonings or herbs instead of table salt or sea salt. Check with your health care provider or pharmacist before using salt substitutes. Eat lower-sodium products, often labeled as "lower sodium" or "no   salt added." Eat fresh foods. Eat more vegetables, fruits, and low-fat dairy products. Choose whole grains. Look for the word "whole" as the first word in the ingredient list. Choose fish  Limit sweets, desserts, sugars, and sugary drinks. Choose heart-healthy fats. Eat veggie cheese  Eat more home-cooked food and less restaurant, buffet, and fast food. Limit fried foods. Cook foods using methods other than  frying. Limit canned vegetables. If you do use them, rinse them well to decrease the sodium. When eating at a restaurant, ask that your food be prepared with less salt, or no salt if possible.                      WHAT FOODS CAN I EAT? Read Dr Joel Fuhrman's books on The End of Dieting & The End of Diabetes  Grains Whole grain or whole wheat bread. Brown rice. Whole grain or whole wheat pasta. Quinoa, bulgur, and whole grain cereals. Low-sodium cereals. Corn or whole wheat flour tortillas. Whole grain cornbread. Whole grain crackers. Low-sodium crackers.  Vegetables Fresh or frozen vegetables (raw, steamed, roasted, or grilled). Low-sodium or reduced-sodium tomato and vegetable juices. Low-sodium or reduced-sodium tomato sauce and paste. Low-sodium or reduced-sodium canned vegetables.   Fruits All fresh, canned (in natural juice), or frozen fruits.  Protein Products  All fish and seafood.  Dried beans, peas, or lentils. Unsalted nuts and seeds. Unsalted canned beans.  Dairy Low-fat dairy products, such as skim or 1% milk, 2% or reduced-fat cheeses, low-fat ricotta or cottage cheese, or plain low-fat yogurt. Low-sodium or reduced-sodium cheeses.  Fats and Oils Tub margarines without trans fats. Light or reduced-fat mayonnaise and salad dressings (reduced sodium). Avocado. Safflower, olive, or canola oils. Natural peanut or almond butter.  Other Unsalted popcorn and pretzels. The items listed above may not be a complete list of recommended foods or beverages. Contact your dietitian for more options.  ++++++++++++++++++  WHAT FOODS ARE NOT RECOMMENDED? Grains/ White flour or wheat flour White bread. White pasta. White rice. Refined cornbread. Bagels and croissants. Crackers that contain trans fat.  Vegetables  Creamed or fried vegetables. Vegetables in a . Regular canned vegetables. Regular canned tomato sauce and paste. Regular tomato and vegetable juices.  Fruits Dried fruits.  Canned fruit in light or heavy syrup. Fruit juice.  Meat and Other Protein Products Meat in general - RED meat & White meat.  Fatty cuts of meat. Ribs, chicken wings, all processed meats as bacon, sausage, bologna, salami, fatback, hot dogs, bratwurst and packaged luncheon meats.  Dairy Whole or 2% milk, cream, half-and-half, and cream cheese. Whole-fat or sweetened yogurt. Full-fat cheeses or blue cheese. Non-dairy creamers and whipped toppings. Processed cheese, cheese spreads, or cheese curds.  Condiments Onion and garlic salt, seasoned salt, table salt, and sea salt. Canned and packaged gravies. Worcestershire sauce. Tartar sauce. Barbecue sauce. Teriyaki sauce. Soy sauce, including reduced sodium. Steak sauce. Fish sauce. Oyster sauce. Cocktail sauce. Horseradish. Ketchup and mustard. Meat flavorings and tenderizers. Bouillon cubes. Hot sauce. Tabasco sauce. Marinades. Taco seasonings. Relishes.  Fats and Oils Butter, stick margarine, lard, shortening and bacon fat. Coconut, palm kernel, or palm oils. Regular salad dressings.  Pickles and olives. Salted popcorn and pretzels.  The items listed above may not be a complete list of foods and beverages to avoid.   

## 2021-01-01 NOTE — Progress Notes (Signed)
Annual Screening/Preventative Visit & Comprehensive Evaluation &  Examination  Future Appointments  Date Time Provider Department  01/02/2021 11:00 AM Unk Pinto, MD [facility]  03/12/2021  9:00 AM Nahser, Wonda Cheng, MD [facility]  01/14/2022 11:00 AM Unk Pinto, MD [facility]        This very nice 60 y.o. MWF  presents for a Screening /Preventative Visit & comprehensive evaluation and management of multiple medical co-morbidities.  Patient has been followed for HTN, HLD, Prediabetes  and Vitamin D Deficiency. She also has hx/o mild OSA & was recommended an orsl device. Patient has GERD & is usually controlled with diet & her meds.        HTN predates since  2014 .  In 2016, she was suspected to have a TIA vs Migraine event . In 2017, Cardiac MRI was Normal per Dr Cathie Olden. Stress Myoview in 2018 was Negative .  Patient's BP has been controlled at home . CAC testing in April 2022 was essentially Negative. Patient denies any cardiac symptoms as chest pain, palpitations, shortness of breath, dizziness or ankle swelling. Today's BP is at goal -  117/75.         Patient's hyperlipidemia is controlled with diet and gLipitor .  Patient denies myalgias or other medication SE's. Last lipids were at goal with sl elevated Trig's :  Lab Results  Component Value Date   CHOL 126 11/13/2020   HDL 38 (L) 11/13/2020   LDLCALC 60 11/13/2020   TRIG 165 (H) 11/13/2020   CHOLHDL 3.3 11/13/2020         Patient has hx/o prediabetes predating (A1c 6.0%  /2012) and patient denies reactive hypoglycemic symptoms, visual blurring, diabetic polys or paresthesias. Last A1c was not at goal :  Lab Results  Component Value Date   HGBA1C 6.0 (H) 08/01/2020         Finally, patient has history of Vitamin D Deficiency ("48" on tx /2008)  and last Vitamin D was at goal :  Lab Results  Component Value Date   VD25OH 103 (H) 08/01/2020     Current Outpatient Medications on File Prior to Visit  Medication Sig    acyclovir ointment (ZOVIRAX) 5 % Apply 1 application topically every 3 (three) hours.   Adapalene-Benz.Peroxide (EPIDUO) 0.1-2.5 % gel Apply 1 application topically as needed (acne).   albuterol HFA inhaler 2 PUFFS  EVERY 4 HOURS AS NEEDED    amLODipine 5 MG tablet TAKE 1 TABLET  EVERY DAY    aspirin EC 81 MG tablet Take 1 tablet  daily.   atorvastatin  40 MG tablet Take 1 tablet daily.   VITAMIN D  5,000 Units  Take  daily.    escitalopram 10 MG tablet TAKE 1 TABLET DAILY    estradiol  0.5 MG tablet Take daily.   FLONASE nasal spray 2 SPRAYS INTO EACH NOSTRIL Daily    furosemide  40 MG tablet TAKE 1 TABLET EVERY DAY    ibuprofen 800 MG tablet Take  1 tablet  3 x /day  with as needed    medroxyPROGESTERone  2.5 MG  Take daily.   montelukast  10 MG tablet TAKE 1 TABLET  DAILY    olmesartan  20 MG tablet Take 1 tablet daily.   Omega-3 FISH OIL  Take 1 capsule  daily.   Pseudoephedrine 120 MG 12 hr  Take  1 tablet  2 x /day for    topiramate 50 MG tablet TAKE 1/2-1 TABLET TWICE DAILY  valACYclovir 500 MG tablet Take  1 tablet  2 x /day as needed for Viral Suppression      Allergies  Allergen Reactions   Metoprolol     Not allergy, but intolerance - had bradycardia with HR in the 40s even with low dose   Oxycodone Itching    Itching: Trunk, palms of hands, legs, back       Past Medical History:  Diagnosis Date   Allergy    Anxiety    Asthma    Cardiomyopathy ([place])    a. Echo 8/16:  EF 40-45%, apical and ant-septal HK, Gr 2 DD   Diverticula, colon    Family history of adverse reaction to anesthesia    sisiter had PONV   Headache    rare migraines   History of cardiovascular stress test    Myoview 8/16:  EF 48%, anterior and apical defect c/w breast atten, no ischemia; Low Risk // Nuclear stress test 9/18: Low risk stress nuclear study with fixed septal defect consistent with LBBB vs prior infarct; no ischemia; EF 53 with septal akinesis.   Hyperlipidemia    Hypertension     Irregular periods/menstrual cycles    LBBB (left bundle branch block)    Melanoma ([place])    right arm   Seasonal allergies    TIA (transient ischemic attack)    Torn meniscus      Health Maintenance  Topic Date Due   Zoster Vaccines- Shingrix (1 of 2) Never done   Pneumococcal Vaccine 82-56 Years old (2 - PCV) 02/11/2005   PAP SMEAR-Modifier  05/19/2019   COVID-19 Vaccine (3 - Pfizer risk series) 10/03/2019   MAMMOGRAM  07/03/2021   TETANUS/TDAP  01/08/2025   COLONOSCOPY (Pts 45-70yrs Insurance coverage will need to be confirmed)  07/13/2027   INFLUENZA VACCINE  Completed   Hepatitis C Screening  Completed   HIV Screening  Completed   HPV VACCINES  Aged Out     Immunization History  Administered Date(s) Administered   Influenza Inj Mdck Quad  10/06/2018, 10/06/2018   Influenza Inj Mdck Quad  10/13/2016, 11/05/2017, 12/20/2019   Influenza Split 10/24/2013   Influenza,inj,Quad  12/09/2020   Influenza,inj,quad 12/24/2015   Influenza 09/12/2013, 10/13/2015   PFIZER SARS-COV-2 Vacc 08/08/2019, 09/05/2019   PPD Test 10/13/2016, 11/05/2017, 12/20/2019   Pneumococcal -23 02/12/2004   Td 10/12/2004   Tdap 01/09/2015    Last Colon - Last Colon - 07/12/2017 - Dr Havery Moros - Hyperplastic polyp  - Recc 10 yr f/u due July 2029.  Last MGM - 07/04/2019 - Overdue - patient aware   Past Surgical History:  Procedure Laterality Date   ABLATION     BREAST REDUCTION SURGERY     CARDIAC CATHETERIZATION     CESAREAN SECTION     x2   KNEE SURGERY     partial knee replacement right   MELANOMA EXCISION WITH SENTINEL LYMPH NODE BIOPSY Right 09/15/2017   Procedure: BIOPSY RIGHT SHOULDER MOLE AND WIDE LOCAL EXCISION OF RIGHT ARM MELANOMA, ADVANCEMENT FLAP CLOSURE, SENTINEL LYMPH NODE BIOPSY  RIGHT ARM MELANOMA;  Surgeon: Stark Klein, MD;  Location: [facility];  Service: General;  Laterality: Right;   REDUCTION MAMMAPLASTY Bilateral    REPAIR QUADRICEPS / HAMSTRING  MUSCLE     REPLACEMENT TOTAL KNEE Left 09/20/2018   Dr. Okey Dupre at [facility]     tubes in ears  10/12     Family History  Problem Relation Age of Onset  Diabetes Mother    Heart disease Mother    Hypertension Mother    Heart attack Mother 6   Other Father        MVA   Asthma Sister    Asthma Son    Cancer Maternal Grandmother        Uterine   Breast cancer Maternal Grandmother    Heart attack Paternal Grandmother    Colon cancer Neg Hx    Colon polyps Neg Hx    Esophageal cancer Neg Hx    Stomach cancer Neg Hx    Rectal cancer Neg Hx      Social History   Tobacco Use   Smoking status: Never   Smokeless tobacco: Never  Vaping Use   Vaping Use: Never used  Substance Use Topics   Alcohol use: Yes    Comment: 1-2 glasses wine nightly   Drug use: No      ROS Constitutional: Denies fever, chills, weight loss/gain, headaches, insomnia,  night sweats, and change in appetite. Does c/o fatigue. Eyes: Denies redness, blurred vision, diplopia, discharge, itchy, watery eyes.  ENT: Denies discharge, congestion, post nasal drip, epistaxis, sore throat, earache, hearing loss, dental pain, Tinnitus, Vertigo, Sinus pain, snoring.  Cardio: Denies chest pain, palpitations, irregular heartbeat, syncope, dyspnea, diaphoresis, orthopnea, PND, claudication, edema Respiratory: denies cough, dyspnea, DOE, pleurisy, hoarseness, laryngitis, wheezing.  Gastrointestinal: Denies dysphagia, heartburn, reflux, water brash, pain, cramps, nausea, vomiting, bloating, diarrhea, constipation, hematemesis, melena, hematochezia, jaundice, hemorrhoids Genitourinary: Denies dysuria, frequency, urgency, nocturia, hesitancy, discharge, hematuria, flank pain Breast: Breast lumps, nipple discharge, bleeding.  Musculoskeletal: Denies arthralgia, myalgia, stiffness, Jt. Swelling, pain, limp, and strain/sprain. Denies falls. Skin: Denies puritis, rash, hives, warts, acne, eczema, changing in skin  lesion Neuro: No weakness, tremor, incoordination, spasms, paresthesia, pain Psychiatric: Denies confusion, memory loss, sensory loss. Denies Depression. Endocrine: Denies change in weight, skin, hair change, nocturia, and paresthesia, diabetic polys, visual blurring, hyper / hypo glycemic episodes.  Heme/Lymph: No excessive bleeding, bruising, enlarged lymph nodes.  Physical Exam  BP 117/75    Pulse 66    Temp 97.9 F (36.6 C)    Resp 16    Ht 5' 0.5" (1.537 m)    Wt 140 lb 1 oz (63.5 kg)    SpO2 98%    BMI 26.90 kg/m   General Appearance: Well nourished, well groomed and in no apparent distress.  Eyes: PERRLA, EOMs, conjunctiva no swelling or erythema, normal fundi and vessels. Sinuses: No frontal/maxillary tenderness ENT/Mouth: EACs patent / TMs  nl. Nares clear without erythema, swelling, mucoid exudates. Oral hygiene is good. No erythema, swelling, or exudate. Tongue normal, non-obstructing. Tonsils not swollen or erythematous. Hearing normal.  Neck: Supple, thyroid not palpable. No bruits, nodes or JVD. Respiratory: Respiratory effort normal.  BS equal and clear bilateral without rales, rhonci, wheezing or stridor. Cardio: Heart sounds are normal with regular rate and rhythm and no murmurs, rubs or gallops. Peripheral pulses are normal and equal bilaterally without edema. No aortic or femoral bruits. Chest: symmetric with normal excursions and percussion. Breasts: Symmetric, without lumps, nipple discharge, retractions, or fibrocystic changes.  Abdomen: Flat, soft with bowel sounds active. Nontender, no guarding, rebound, hernias, masses, or organomegaly.  Lymphatics: Non tender without lymphadenopathy.  Genitourinary:  Musculoskeletal: Full ROM all peripheral extremities, joint stability, 5/5 strength, and normal gait. Skin: Warm and dry without rashes, lesions, cyanosis, clubbing or  ecchymosis.  Neuro: Cranial nerves intact, reflexes equal bilaterally. Normal muscle tone, no  cerebellar symptoms. Sensation  intact.  Pysch: Alert and oriented X 3, normal affect, Insight and Judgment appropriate.    Assessment and Plan  1. Annual Preventative Screening Examination  2. Essential hypertension  - EKG 12-Lead - [place], RETROPERITNL ABD,  LTD - Urinalysis, Routine w reflex microscopic - Microalbumin / creatinine urine ratio - CBC with Differential/Platelet - COMPLETE METABOLIC PANEL WITH GFR - Magnesium - TSH  3. Hyperlipidemia, mixed  - EKG 12-Lead - [place], RETROPERITNL ABD,  LTD - Lipid panel - TSH  4. Abnormal glucose - EKG 12-Lead - [place], RETROPERITNL ABD,  LTD - Hemoglobin A1c - Insulin, random  5. Vitamin D deficiency  - VITAMIN D 25 Hydroxy   6. Screening-pulmonary TB  - TB Skin Test  7. B12 deficiency  - Vitamin B12  8. Screening for colorectal cancer  - POC Hemoccult Bld/Stl   9. Screening for ischemic heart disease  - EKG 12-Lead  10. FHx: heart disease  - EKG 12-Lead  11. Screening for AAA (aortic abdominal aneurysm)  - [place], RETROPERITNL ABD,  LTD  12. Fatigue  - Iron, Total/Total Iron Binding Cap - Vitamin B12 - CBC with Differential/Platelet - TSH  13. Medication management  - Urinalysis, Routine w reflex microscopic - Microalbumin / creatinine urine ratio - Vitamin B12 - CBC with Differential/Platelet - COMPLETE METABOLIC PANEL WITH GFR - Magnesium - Lipid panel - TSH - Hemoglobin A1c - Insulin, random - VITAMIN D 25 Hydroxyl  14. History of TIA (transient ischemic attack)   15. Gastroesophageal reflux disease with esophagitis without hemorrhage          Patient was counseled in prudent diet to achieve/maintain BMI less than 25 for weight control, BP monitoring, regular exercise and medications. Discussed med's effects and SE's. Screening labs and tests as requested with regular follow-up as recommended. Over 40 minutes of exam, counseling, chart review and high complex critical decision making was  performed.   Kirtland Bouchard, MD

## 2021-01-02 ENCOUNTER — Other Ambulatory Visit: Payer: Self-pay

## 2021-01-02 ENCOUNTER — Encounter: Payer: Self-pay | Admitting: Internal Medicine

## 2021-01-02 ENCOUNTER — Ambulatory Visit: Payer: BC Managed Care – PPO | Admitting: Internal Medicine

## 2021-01-02 VITALS — BP 117/75 | HR 66 | Temp 97.9°F | Resp 16 | Ht 60.5 in | Wt 140.1 lb

## 2021-01-02 DIAGNOSIS — Z111 Encounter for screening for respiratory tuberculosis: Secondary | ICD-10-CM

## 2021-01-02 DIAGNOSIS — E782 Mixed hyperlipidemia: Secondary | ICD-10-CM

## 2021-01-02 DIAGNOSIS — Z131 Encounter for screening for diabetes mellitus: Secondary | ICD-10-CM

## 2021-01-02 DIAGNOSIS — Z0001 Encounter for general adult medical examination with abnormal findings: Secondary | ICD-10-CM

## 2021-01-02 DIAGNOSIS — Z1211 Encounter for screening for malignant neoplasm of colon: Secondary | ICD-10-CM

## 2021-01-02 DIAGNOSIS — Z0000 Encounter for general adult medical examination without abnormal findings: Secondary | ICD-10-CM | POA: Diagnosis not present

## 2021-01-02 DIAGNOSIS — Z8249 Family history of ischemic heart disease and other diseases of the circulatory system: Secondary | ICD-10-CM

## 2021-01-02 DIAGNOSIS — Z130 Encounter for screening for diseases of the blood and blood-forming organs and certain disorders involving the immune mechanism: Secondary | ICD-10-CM | POA: Diagnosis not present

## 2021-01-02 DIAGNOSIS — Z79899 Other long term (current) drug therapy: Secondary | ICD-10-CM | POA: Diagnosis not present

## 2021-01-02 DIAGNOSIS — Z8673 Personal history of transient ischemic attack (TIA), and cerebral infarction without residual deficits: Secondary | ICD-10-CM | POA: Diagnosis not present

## 2021-01-02 DIAGNOSIS — Z13220 Encounter for screening for lipoid disorders: Secondary | ICD-10-CM | POA: Diagnosis not present

## 2021-01-02 DIAGNOSIS — Z136 Encounter for screening for cardiovascular disorders: Secondary | ICD-10-CM

## 2021-01-02 DIAGNOSIS — Z1389 Encounter for screening for other disorder: Secondary | ICD-10-CM

## 2021-01-02 DIAGNOSIS — K2100 Gastro-esophageal reflux disease with esophagitis, without bleeding: Secondary | ICD-10-CM

## 2021-01-02 DIAGNOSIS — E559 Vitamin D deficiency, unspecified: Secondary | ICD-10-CM | POA: Diagnosis not present

## 2021-01-02 DIAGNOSIS — I10 Essential (primary) hypertension: Secondary | ICD-10-CM | POA: Diagnosis not present

## 2021-01-02 DIAGNOSIS — E538 Deficiency of other specified B group vitamins: Secondary | ICD-10-CM

## 2021-01-02 DIAGNOSIS — R7309 Other abnormal glucose: Secondary | ICD-10-CM

## 2021-01-02 DIAGNOSIS — R5383 Other fatigue: Secondary | ICD-10-CM

## 2021-01-02 MED ORDER — ESOMEPRAZOLE MAGNESIUM 40 MG PO CPDR: DELAYED_RELEASE_CAPSULE | ORAL | 3 refills | Status: DC

## 2021-01-03 LAB — LIPID PANEL
Cholesterol: 161 mg/dL (ref <200)
HDL: 47 mg/dL — ABNORMAL LOW (ref >=50)
LDL Cholesterol (Calc): 85 mg/dL (calc)
Non-HDL Cholesterol (Calc): 114 mg/dL (calc) (ref <130)
Total CHOL/HDL Ratio: 3.4 (calc) (ref <5.0)
Triglycerides: 191 mg/dL — ABNORMAL HIGH (ref <150)

## 2021-01-03 LAB — CBC WITH DIFFERENTIAL/PLATELET
Absolute Monocytes: 540 cells/uL (ref 200–950)
Basophils Absolute: 80 cells/uL (ref 0–200)
Basophils Relative: 1.1 %
Eosinophils Absolute: 190 cells/uL (ref 15–500)
Eosinophils Relative: 2.6 %
HCT: 45.2 % — ABNORMAL HIGH (ref 35.0–45.0)
Hemoglobin: 15 g/dL (ref 11.7–15.5)
Lymphs Abs: 2270 cells/uL (ref 850–3900)
MCH: 30.9 pg (ref 27.0–33.0)
MCHC: 33.2 g/dL (ref 32.0–36.0)
MCV: 93.2 fL (ref 80.0–100.0)
MPV: 12.7 fL — ABNORMAL HIGH (ref 7.5–12.5)
Monocytes Relative: 7.4 %
Neutro Abs: 4219 cells/uL (ref 1500–7800)
Neutrophils Relative %: 57.8 %
Platelets: 370 10*3/uL (ref 140–400)
RBC: 4.85 10*6/uL (ref 3.80–5.10)
RDW: 13.3 % (ref 11.0–15.0)
Total Lymphocyte: 31.1 %
WBC: 7.3 10*3/uL (ref 3.8–10.8)

## 2021-01-03 LAB — COMPLETE METABOLIC PANEL WITH GFR
AG Ratio: 1.7 (calc) (ref 1.0–2.5)
ALT: 18 U/L (ref 6–29)
AST: 15 U/L (ref 10–35)
Albumin: 4.3 g/dL (ref 3.6–5.1)
Alkaline phosphatase (APISO): 93 U/L (ref 37–153)
BUN: 19 mg/dL (ref 7–25)
CO2: 26 mmol/L (ref 20–32)
Calcium: 9.5 mg/dL (ref 8.6–10.4)
Chloride: 107 mmol/L (ref 98–110)
Creat: 0.86 mg/dL (ref 0.50–1.05)
Globulin: 2.5 g/dL (calc) (ref 1.9–3.7)
Glucose, Bld: 84 mg/dL (ref 65–99)
Potassium: 3.8 mmol/L (ref 3.5–5.3)
Sodium: 142 mmol/L (ref 135–146)
Total Bilirubin: 0.5 mg/dL (ref 0.2–1.2)
Total Protein: 6.8 g/dL (ref 6.1–8.1)
eGFR: 77 mL/min/{1.73_m2} (ref >=60)

## 2021-01-03 LAB — URINALYSIS, ROUTINE W REFLEX MICROSCOPIC
Bilirubin Urine: NEGATIVE
Glucose, UA: NEGATIVE
Hgb urine dipstick: NEGATIVE
Ketones, ur: NEGATIVE
Leukocytes,Ua: NEGATIVE
Nitrite: NEGATIVE
Protein, ur: NEGATIVE
Specific Gravity, Urine: 1.004 (ref 1.001–1.035)
pH: 7 (ref 5.0–8.0)

## 2021-01-03 LAB — INSULIN, RANDOM: Insulin: 52.7 u[IU]/mL — ABNORMAL HIGH

## 2021-01-03 LAB — MICROALBUMIN / CREATININE URINE RATIO
Creatinine, Urine: 10 mg/dL — ABNORMAL LOW (ref 20–275)
Microalb, Ur: <0.2 mg/dL

## 2021-01-03 LAB — VITAMIN D 25 HYDROXY (VIT D DEFICIENCY, FRACTURES): Vit D, 25-Hydroxy: 71 ng/mL (ref 30–100)

## 2021-01-03 LAB — TSH: TSH: 1.31 mIU/L (ref 0.40–4.50)

## 2021-01-03 LAB — IRON, TOTAL/TOTAL IRON BINDING CAP
%SAT: 23 % (calc) (ref 16–45)
Iron: 76 ug/dL (ref 45–160)
TIBC: 332 mcg/dL (calc) (ref 250–450)

## 2021-01-03 LAB — HEMOGLOBIN A1C
Hgb A1c MFr Bld: 6.1 % of total Hgb — ABNORMAL HIGH (ref <5.7)
Mean Plasma Glucose: 128 mg/dL
eAG (mmol/L): 7.1 mmol/L

## 2021-01-03 LAB — VITAMIN B12: Vitamin B-12: 383 pg/mL (ref 200–1100)

## 2021-01-03 LAB — MAGNESIUM: Magnesium: 2.3 mg/dL (ref 1.5–2.5)

## 2021-01-05 NOTE — Progress Notes (Signed)
============================================================ - Test results slightly outside the reference range are not unusual. If there is anything important, I will review this with you,  otherwise it is considered normal test values.  If you have further questions,  please do not hesitate to contact me at the office or via My Chart.  ============================================================ ============================================================  -  Iron levels Normal   ============================================================ ============================================================  -   -  Vitamin B12 =      Very Low  (Ideal or Goal Vit B12 is between 450 - 1,100)   Low Vit B12 may be associated with Anemia , Fatigue,   Peripheral Neuropathy, Dementia, "Brain Fog", & Depression  - Recommend take a sub-lingual form of Vitamin B12 tablet   1,000 to 5,000 mcg tab that you dissolve under your tongue /Daily   - Can get Baron Sane - best price at [employer] or on [employer]  ============================================================ ============================================================  -  Total Chol = 161      & LDL Chol= 85-  Excellent   - Very low risk for Heart Attack  / Stroke ============================================================ ============================================================  -  But Triglycerides ( 191 ) or fats in blood are too high  (goal is less than 150)    - Recommend avoid fried & greasy foods,  sweets / candy,   - Avoid white rice  (brown or wild rice or Quinoa is OK),   - Avoid white potatoes  (sweet potatoes are OK)   - Avoid anything made from white flour  - bagels, doughnuts, rolls, buns, biscuits, white and   wheat breads, pizza crust and traditional  pasta made of white flour & egg white  - (vegetarian pasta or spinach or wheat pasta is OK).    - Multi-grain bread is OK - like multi-grain flat bread  or  sandwich thins.   - Avoid alcohol in excess.   - Exercise is also important.  ============================================================ ============================================================  -  A1c = 6.1%  -  Blood sugar and A1c are elevated in the borderline and  early or pre-diabetes range which has the same   300% increased risk for heart attack, stroke, cancer and   alzheimer- type vascular dementia as full blown diabetes.   But the good news is that diet, exercise with  weight loss can cure the early diabetes at this point. ============================================================ ============================================================  -  Insulin is very high at 52.7   shows insulin resistance   - a sign of early diabetes and associated with a 300 % greater risk for   heart attacks, strokes, cancer & Alzheimer type vascular dementia   - All this can be cured  and prevented with losing weight   - get Dr Fara Olden Fuhrman's book 'the End of Diabetes" and                                                                                       "the End of Dieting"   - and add many years of good health to your life. ============================================================ ============================================================  -  Vitamin D = 71 - Excellent  ============================================================ ============================================================  -  All Else - CBC - Kidneys - Electrolytes -  Liver - Magnesium & Thyroid    - all  Normal / OK ============================================================ ============================================================

## 2021-01-06 ENCOUNTER — Encounter: Payer: Self-pay | Admitting: Internal Medicine

## 2021-01-27 DIAGNOSIS — M9901 Segmental and somatic dysfunction of cervical region: Secondary | ICD-10-CM | POA: Diagnosis not present

## 2021-02-13 DIAGNOSIS — Z01419 Encounter for gynecological examination (general) (routine) without abnormal findings: Secondary | ICD-10-CM | POA: Diagnosis not present

## 2021-02-13 DIAGNOSIS — Z1231 Encounter for screening mammogram for malignant neoplasm of breast: Secondary | ICD-10-CM | POA: Diagnosis not present

## 2021-02-13 DIAGNOSIS — Z124 Encounter for screening for malignant neoplasm of cervix: Secondary | ICD-10-CM | POA: Diagnosis not present

## 2021-02-13 DIAGNOSIS — Z6825 Body mass index (BMI) 25.0-25.9, adult: Secondary | ICD-10-CM | POA: Diagnosis not present

## 2021-02-14 ENCOUNTER — Other Ambulatory Visit: Payer: Self-pay | Admitting: Obstetrics & Gynecology

## 2021-02-14 DIAGNOSIS — R928 Other abnormal and inconclusive findings on diagnostic imaging of breast: Secondary | ICD-10-CM

## 2021-02-16 ENCOUNTER — Other Ambulatory Visit: Payer: Self-pay | Admitting: Adult Health

## 2021-02-17 LAB — HM MAMMOGRAPHY

## 2021-03-04 ENCOUNTER — Ambulatory Visit
Admission: RE | Admit: 2021-03-04 | Discharge: 2021-03-04 | Disposition: A | Discharge status: Home / Self Care | Payer: BC Managed Care – PPO | Source: Ambulatory Visit | Attending: Obstetrics & Gynecology | Admitting: Obstetrics & Gynecology

## 2021-03-04 DIAGNOSIS — R928 Other abnormal and inconclusive findings on diagnostic imaging of breast: Secondary | ICD-10-CM

## 2021-03-04 DIAGNOSIS — R922 Inconclusive mammogram: Secondary | ICD-10-CM | POA: Diagnosis not present

## 2021-03-04 LAB — HM MAMMOGRAPHY

## 2021-03-05 ENCOUNTER — Other Ambulatory Visit: Payer: Self-pay | Admitting: Nurse Practitioner

## 2021-03-05 ENCOUNTER — Other Ambulatory Visit: Payer: Self-pay | Admitting: Physician Assistant

## 2021-03-05 DIAGNOSIS — Z79899 Other long term (current) drug therapy: Secondary | ICD-10-CM

## 2021-03-12 ENCOUNTER — Encounter: Payer: Self-pay | Admitting: Cardiovascular Disease

## 2021-03-12 ENCOUNTER — Other Ambulatory Visit: Payer: Self-pay

## 2021-03-12 ENCOUNTER — Ambulatory Visit: Payer: BC Managed Care – PPO | Admitting: Cardiovascular Disease

## 2021-03-12 VITALS — BP 110/58 | HR 60 | Ht 61.0 in | Wt 140.2 lb

## 2021-03-12 DIAGNOSIS — E782 Mixed hyperlipidemia: Secondary | ICD-10-CM | POA: Diagnosis not present

## 2021-03-12 DIAGNOSIS — I2510 Atherosclerotic heart disease of native coronary artery without angina pectoris: Secondary | ICD-10-CM | POA: Insufficient documentation

## 2021-03-12 DIAGNOSIS — I5022 Chronic systolic (congestive) heart failure: Secondary | ICD-10-CM | POA: Diagnosis not present

## 2021-03-12 MED ORDER — POTASSIUM CHLORIDE ER 10 MEQ PO TBCR: 10.0000 meq | EXTENDED_RELEASE_TABLET | Freq: Two times a day (BID) | ORAL | 3 refills | Status: DC

## 2021-03-12 MED ORDER — ROSUVASTATIN CALCIUM 10 MG PO TABS: 10.0000 mg | ORAL_TABLET | Freq: Every day | ORAL | 3 refills | Status: DC

## 2021-03-12 NOTE — Progress Notes (Signed)
Gail Bailey Date of Birth  02/20/1960       [facility]    [employer] [address], [place], [place]  [address]   [place], [place]  [address] [phone]     [phone]   Fax  [phone]    Fax [phone]  Problem List: 1. HTN  History of Present Illness:  Gail Bailey is a 61 yo who I have known for years.  She has been under lots of stress recently.  She has been very fatigued.   She is sleeping well.  She's been on Verapamil  for years for headaches. She recently had lisinopril added to her medical regimen. The dose was increased recently up to 40 mg a day.  She may be eating a bit more salty foods recently.  She has cut out her soft drinks.    April 08, 2012: Gail Bailey is doing OK but is fatigued after starting the HCTZ and potassium.   July 05, 2012:  Gail Bailey is doing well.  We stopped the HCTZ and K due to fatigue - her fatigue is much better.  Her BP at home has been well controlled.  She continues to watch her salt and is exercising daily.    Dec. 2, 2014:  Gail Bailey presents today for worsening CP.  She is not walking as much as she used to .  Typical BP is 027-253 systolic .  She is on prednisone for sinus infection.  She is not sleeping well. This past Sunday, she had some numbness in her left hand.    February 08, 2015:  Gail Bailey is seen back  For follow-up visit. She is re-married. She had a TIA last fall ( vs. Migraine variant )  [name] and had to have it reattached ( Oct. 5, 2016)  ( she fell / slipped on wet floor)   BP has been elevated  - has been started on some new meds.  No CP   Is in rehab for her detached hamstring . Is gradually getting getter  Has gained some weight .   May 09, 2015: Doing well Exercising regularly . Still getting PT for her torn hamstring . Walks 10,000 steps a day  BP is elevated at home. Very normal here.   Oct. 6, 2017:  Has been having headaches Has  TIA symptoms at time - may be a complex  Seeing a neurologist.   Carotids are normal  Very busy at work ,   Exercises every day .   March 12, 2021: Gail Bailey is doing well.  I last saw her in person 6 years ago.  We did a telemedicine visit in 2020.  She has a history of hypertension and has a history of CHF.  Cardiac MRI showed a normal ejection fraction however.  She has had intermittent chest pain over the years.  She had an abnormal Myoview study with an anterior defect.  Heart catheterization performed approximately 25 years ago revealed smooth and normal coronary arteries.  It was thought that the Myoview abnormality was likely breast shadow.  Coronary CT angiogram in April, 2022 revealed:  RCA :  mild prox disease LM:  normal LAD:  prox- mid, mild - moderate cad ,  mild- mod calcified plaque in D1 LCx: :  small , non dom. Mild disease   Coronary calcium score of 132. This was 93rd percentile for age and sex matched control.  Lipids from  Dec. 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  She has had surgery.  The margins were negative and she did well with surgery.       Current Outpatient Medications on File Prior to Visit  Medication Sig Dispense Refill   acyclovir ointment (ZOVIRAX) 5 % Apply 1 application topically every 3 (three) hours. 16 g 1   Adapalene-Benzoyl Peroxide (EPIDUO) 0.1-2.5 % gel Apply 1 application topically as needed (acne). 45 g 0   albuterol (VENTOLIN HFA) 108 (90 Base) MCG/ACT inhaler INHALE 2 PUFFS INTO LUNGS EVERY 4 HOURS AS NEEDED FOR WHEEZING OR SHORTNESS OF BREATH 18 each 1   amLODipine (NORVASC) 5 MG tablet TAKE 1 TABLET BY MOUTH EVERY DAY FOR BLOOD PRESSURE 90 tablet 3   aspirin EC 81 MG tablet Take 1 tablet (81 mg total) by mouth daily.     atorvastatin (LIPITOR) 40 MG tablet Take 1 tablet (40 mg total) by mouth daily. 90 tablet 3   Cholecalciferol (VITAMIN  D PO) Take 5,000 Units by mouth daily.      escitalopram (LEXAPRO) 10 MG tablet TAKE 1 TABLET DAILY FOR MOOD & ANXIETY 90 tablet 3   esomeprazole (NEXIUM) 40 MG capsule Take  1 capsule  Daily  to Prevent Heartburn & Indigestion 90 capsule 3   estradiol (ESTRACE) 0.5 MG tablet Take 0.5 mg by mouth daily.     fluticasone (FLONASE) 50 MCG/ACT nasal spray SPRAY 2 SPRAYS INTO EACH NOSTRIL EVERY DAY 48 mL 1   furosemide (LASIX) 40 MG tablet TAKE 1 TABLET EVERY DAY FOR BLOOD PRESSURE AND FLUID 90 tablet 3   ibuprofen (ADVIL) 800 MG tablet Take  1 tablet  3 x /day  with as needed for Pain & Inflammation 270 tablet 3   medroxyPROGESTERone (PROVERA) 2.5 MG tablet Take 2.5 mg by mouth daily.     montelukast (SINGULAIR) 10 MG tablet TAKE 1 TABLET BY MOUTH DAILY FOR ALLERGIES 90 tablet 3   olmesartan (BENICAR) 20 MG tablet Take 1 tablet (20 mg total) by mouth daily. Please keep upcoming appointment in March 2023 for future refills. Thank you 90 tablet 0   Omega-3 Fatty Acids (FISH OIL PO) Take 1 capsule by mouth daily.     pseudoephedrine (SUDAFED) 120 MG 12 hr tablet Take  1 tablet  2 x /day (every 12 hours)  for Sinus & Chest Congestion 60 tablet 3   topiramate (TOPAMAX) 50 MG tablet TAKE 1/2-1 TABLET TWICE DAILY AT DINNER TIME & BEDTIME FOR DIETING & WEIGHT LOSS 180 tablet 1   valACYclovir (VALTREX) 500 MG tablet Take  1 tablet  2 x /day as needed for Viral Suppression 180 tablet 2   No current facility-administered medications on file prior to visit.  HCTZ 12.5 mg a day.  Allergies  Allergen Reactions   Metoprolol     Not allergy, but intolerance - had bradycardia with HR in the 40s even with low dose   Oxycodone Itching    Itching: Trunk, palms of hands, legs, back      Past Medical History:  Diagnosis Date   Allergy    Anxiety    Asthma    Cardiomyopathy ([place])    a. Echo 8/16:  EF 40-45%, apical and ant-septal HK, Gr 2 DD   Diverticula, colon    Family history of adverse reaction to  anesthesia  sisiter had PONV   Headache    rare migraines   History of cardiovascular stress test    Myoview 8/16:  EF 48%, anterior and apical defect c/w breast atten, no ischemia; Low Risk // Nuclear stress test 9/18: Low risk stress nuclear study with fixed septal defect consistent with LBBB vs prior infarct; no ischemia; EF 53 with septal akinesis.   Hyperlipidemia    Hypertension    Irregular periods/menstrual cycles    LBBB (left bundle branch block)    Melanoma (HCC)    right arm   Seasonal allergies    TIA (transient ischemic attack)    Torn meniscus     Past Surgical History:  Procedure Laterality Date   ABLATION     BREAST REDUCTION SURGERY     CARDIAC CATHETERIZATION     CESAREAN SECTION     x2   KNEE SURGERY     partial knee replacement right   MELANOMA EXCISION WITH SENTINEL LYMPH NODE BIOPSY Right 09/15/2017   Procedure: BIOPSY RIGHT SHOULDER MOLE AND WIDE LOCAL EXCISION OF RIGHT ARM MELANOMA, ADVANCEMENT FLAP CLOSURE, SENTINEL LYMPH NODE BIOPSY  RIGHT ARM MELANOMA;  Surgeon: Stark Klein, MD;  Location: [facility];  Service: General;  Laterality: Right;   REDUCTION MAMMAPLASTY Bilateral    REPAIR QUADRICEPS / HAMSTRING MUSCLE     REPLACEMENT TOTAL KNEE Left 09/20/2018   Dr. Okey Dupre at [facility]     tubes in ears  10/12    Social History   Tobacco Use  Smoking Status Never  Smokeless Tobacco Never    Social History   Substance and Sexual Activity  Alcohol Use Yes   Comment: 1-2 glasses wine nightly    Family History  Problem Relation Age of Onset   Diabetes Mother    Heart disease Mother    Hypertension Mother    Heart attack Mother 75   Other Father        MVA   Asthma Sister    Asthma Son    Cancer Maternal Grandmother        Uterine   Breast cancer Maternal Grandmother    Heart attack Paternal Grandmother    Colon cancer Neg Hx    Colon polyps Neg Hx    Esophageal cancer Neg Hx    Stomach cancer Neg Hx     Rectal cancer Neg Hx     Reviw of Systems:  Reviewed in the HPI.  All other systems are negative.    ECG:  Assessment / Plan:   1. HTN -her blood pressure is well controlled.  2.  Hyperlipidemia: Last LDL is 85.  While this is typically a fairly good level she does have mild coronary artery disease and a strong family history of premature coronary artery disease.    We will discontinue the atorvastatin and start rosuvastatin 10 mg a day.  We will check lipids, ALT, BMP in 3 months . Her triglyceride levels are mildly elevated.  I encouraged her to work on reduction of carbohydrates and to increase her exercise.  3.  CAD :   Cor CT angio Revealed mild coronary artery disease.  She has no symptoms.   Mertie Moores, MD  03/12/2021 8:53 AM    [address],  [place], [place]  [address] Pager [phone] Phone: [phone]; Fax: [phone]

## 2021-03-12 NOTE — Patient Instructions (Signed)
Medication Instructions:  ?Your physician has recommended you make the following change in your medication:  ?STOP: atorvastatin (Lipitor) ? ?START: rosuvastatin (Crestor) 10 mg by mouth once a day ? ?START: Potassium 10 mEq by mouth twice daily ?*If you need a refill on your cardiac medications before your next appointment, please call your pharmacy* ? ? ?Lab Work: ?IN 3 MONTHS: FLP, ALT, BMP ? ?If you have labs (blood work) drawn today and your tests are completely normal, you will receive your results only by: ?MyChart Message (if you have MyChart) OR ?A paper copy in the mail ?If you have any lab test that is abnormal or we need to change your treatment, we will call you to review the results. ? ? ?Testing/Procedures: ?NONE ? ? ?Follow-Up: ?At [facility], you and your health needs are our priority.  As part of our continuing mission to provide you with exceptional heart care, we have created designated Provider Care Teams.  These Care Teams include your primary Cardiologist (physician) and Advanced Practice Providers (APPs -  Physician Assistants and Nurse Practitioners) who all work together to provide you with the care you need, when you need it. ? ? ?Your next appointment:   ?1 year(s) ? ?The format for your next appointment:   ?In Person ? ?Provider:   ?Mertie Moores, MD   ? ? ?

## 2021-03-17 ENCOUNTER — Encounter: Payer: Self-pay | Admitting: Internal Medicine

## 2021-03-26 DIAGNOSIS — M9901 Segmental and somatic dysfunction of cervical region: Secondary | ICD-10-CM | POA: Diagnosis not present

## 2021-04-07 DIAGNOSIS — M9901 Segmental and somatic dysfunction of cervical region: Secondary | ICD-10-CM | POA: Diagnosis not present

## 2021-04-08 DIAGNOSIS — L570 Actinic keratosis: Secondary | ICD-10-CM | POA: Diagnosis not present

## 2021-04-08 DIAGNOSIS — L820 Inflamed seborrheic keratosis: Secondary | ICD-10-CM | POA: Diagnosis not present

## 2021-04-21 ENCOUNTER — Other Ambulatory Visit: Payer: Self-pay

## 2021-04-21 DIAGNOSIS — Z1211 Encounter for screening for malignant neoplasm of colon: Secondary | ICD-10-CM | POA: Diagnosis not present

## 2021-04-21 DIAGNOSIS — Z1212 Encounter for screening for malignant neoplasm of rectum: Secondary | ICD-10-CM | POA: Diagnosis not present

## 2021-04-21 LAB — POC HEMOCCULT BLD/STL (HOME/3-CARD/SCREEN)
Card #2 Fecal Occult Blod, POC: NEGATIVE
Card #3 Fecal Occult Blood, POC: NEGATIVE
Fecal Occult Blood, POC: NEGATIVE

## 2021-04-29 ENCOUNTER — Other Ambulatory Visit: Payer: Self-pay | Admitting: Physician Assistant

## 2021-04-30 ENCOUNTER — Other Ambulatory Visit: Payer: Self-pay | Admitting: Physician Assistant

## 2021-05-06 DIAGNOSIS — H16143 Punctate keratitis, bilateral: Secondary | ICD-10-CM | POA: Diagnosis not present

## 2021-06-02 ENCOUNTER — Other Ambulatory Visit: Payer: Self-pay | Admitting: Cardiovascular Disease

## 2021-06-17 ENCOUNTER — Other Ambulatory Visit: Payer: BC Managed Care – PPO

## 2021-06-17 DIAGNOSIS — E782 Mixed hyperlipidemia: Secondary | ICD-10-CM | POA: Diagnosis not present

## 2021-06-17 DIAGNOSIS — I2510 Atherosclerotic heart disease of native coronary artery without angina pectoris: Secondary | ICD-10-CM | POA: Diagnosis not present

## 2021-06-17 DIAGNOSIS — M9901 Segmental and somatic dysfunction of cervical region: Secondary | ICD-10-CM | POA: Diagnosis not present

## 2021-06-17 DIAGNOSIS — I5022 Chronic systolic (congestive) heart failure: Secondary | ICD-10-CM | POA: Diagnosis not present

## 2021-06-17 LAB — BASIC METABOLIC PANEL
BUN/Creatinine Ratio: 17 (ref 12–28)
BUN: 14 mg/dL (ref 8–27)
CO2: 21 mmol/L (ref 20–29)
Calcium: 8.9 mg/dL (ref 8.7–10.3)
Chloride: 102 mmol/L (ref 96–106)
Creatinine, Ser: 0.82 mg/dL (ref 0.57–1.00)
Glucose: 113 mg/dL — ABNORMAL HIGH (ref 70–99)
Potassium: 4 mmol/L (ref 3.5–5.2)
Sodium: 139 mmol/L (ref 134–144)
eGFR: 82 mL/min/{1.73_m2} (ref >59)

## 2021-06-17 LAB — LIPID PANEL
Chol/HDL Ratio: 3.5 ratio (ref 0.0–4.4)
Cholesterol, Total: 114 mg/dL (ref 100–199)
HDL: 33 mg/dL — ABNORMAL LOW (ref >39)
LDL Chol Calc (NIH): 54 mg/dL (ref 0–99)
Triglycerides: 156 mg/dL — ABNORMAL HIGH (ref 0–149)
VLDL Cholesterol Cal: 27 mg/dL (ref 5–40)

## 2021-06-17 LAB — ALT: ALT: 17 IU/L (ref 0–32)

## 2021-06-18 ENCOUNTER — Other Ambulatory Visit: Payer: BC Managed Care – PPO

## 2021-06-19 ENCOUNTER — Other Ambulatory Visit: Payer: Self-pay | Admitting: Adult Health

## 2021-07-03 DIAGNOSIS — M9901 Segmental and somatic dysfunction of cervical region: Secondary | ICD-10-CM | POA: Diagnosis not present

## 2021-07-07 ENCOUNTER — Ambulatory Visit: Payer: BC Managed Care – PPO | Admitting: Nurse Practitioner

## 2021-07-16 NOTE — Progress Notes (Addendum)
Assessment and Plan:   Essential hypertension - continue medications, DASH diet, exercise and monitor at home. Call if greater than 130/80.  -     CBC with Differential/Platelet -     CMP/GFR -     TSH  Chronic systolic CHF (congestive heart failure) ([place]) Weights up bu appears euvolemic; continue to monitor daily Continue current medications Continue to limit sodium, fluid Follow up cardiology  Uncomplicated Asthma Continue Singulair and Ventolin as needed Currently well controlled  Hyperlipidemia -continue medications, check lipids, decrease fatty foods, increase activity.  -     Lipid panel  Medication management -     Magnesium  Abnormal Glucose Discussed disease and risks Discussed diet/exercise, weight management  A1C   Mild sleep apnea Has new oral device and reports tolerating well   Melanoma of upper arm Continue q18mfollow up with surgeon and dermatology  Overweight - BMI 26 Long discussion about weight loss, diet, and exercise Recommended diet heavy in fruits and veggies and low in animal meats, cheeses, and dairy products, appropriate calorie intake Patient will continue with lifestyle changes Discussed appropriate weight for height and initial goal (<135lb) Follow up at next visit   Continue diet and meds as discussed. Further disposition pending results of labs. Future Appointments  Date Time Provider DBrocket 01/14/2022 11:00 AM MUnk Pinto MD [facility] None    HPI 61y.o. female  presents for 3 month follow up with hypertension, hyperlipidemia, prediabetes and vitamin D   She is on oral device for mild sleep apnea per Dr. DBrett Fairy Asthma well managed by singulair and rare albuterol. Follows with derm/surgeon due to R upper arm melanoma removed in 09/2017 and has done well, following q330metween derm/surgeon.    She is on lexapro for anxiety/hot flashes. Didn't see benefit with buspar, felt jittery. On HRT via OBGYN.  BMI is Body  mass index is 26.26 kg/m., she has been working on diet and exercise, She is walking 8-10000 steps a day and doing light weights. Wt Readings from Last 3 Encounters:  07/21/21 139 lb (63 kg)  03/12/21 140 lb 3.2 oz (63.6 kg)  01/02/21 140 lb 1 oz (63.5 kg)   Her blood pressure has been controlled at home, today their BP is BP: 110/68  BP Readings from Last 3 Encounters:  07/21/21 110/68  03/12/21 (!) 110/58  01/02/21 117/75  She does workout. She denies chest pain, shortness of breath, dizziness.  She has a history of CHF, last echo 05/2020 stress test 09/2016, EF 50% with grade 2 diastolic dysfunction.  She reports swelling is improved.    She is on cholesterol medication, Rosuvastatin 10 mg QD, fish/krill oil daily and denies myalgias. Her cholesterol is at goal, trigs remain elevated. She reports 1-2 glasses of wine in the evening.The cholesterol last visit was:   Lab Results  Component Value Date   CHOL 114 06/17/2021   HDL 33 (L) 06/17/2021   LDLCALC 54 06/17/2021   TRIG 156 (H) 06/17/2021   CHOLHDL 3.5 06/17/2021   She has been working on diet and exercise for prediabetes, and denies paresthesia of the feet, polydipsia and polyuria. Last A1C in the office was:  Lab Results  Component Value Date   HGBA1C 6.1 (H) 01/02/2021    Last GFR:  Lab Results  Component Value Date   [facility] 04/02/2020   Patient is on Vitamin D supplement.   Lab Results  Component Value Date   VD25OH 71 01/02/2021  Current Medications:   Current Outpatient Medications (Endocrine & Metabolic):    estradiol (ESTRACE) 0.5 MG tablet, Take 0.5 mg by mouth daily.   medroxyPROGESTERone (PROVERA) 2.5 MG tablet, Take 2.5 mg by mouth daily.  Current Outpatient Medications (Cardiovascular):    amLODipine (NORVASC) 5 MG tablet, TAKE 1 TABLET BY MOUTH EVERY DAY FOR BLOOD PRESSURE   furosemide (LASIX) 40 MG tablet, TAKE 1 TABLET EVERY DAY FOR BLOOD PRESSURE AND FLUID   metoprolol succinate  (TOPROL-XL) 25 MG 24 hr tablet, TAKE 1/2 TABLET BY MOUTH EVERY DAY   olmesartan (BENICAR) 20 MG tablet, TAKE 1 TABLET BY MOUTH DAILY. PLEASE KEEP UPCOMING APPOINTMENT IN MARCH 2023 FOR FUTURE REFILLS.   rosuvastatin (CRESTOR) 10 MG tablet, Take 1 tablet (10 mg total) by mouth daily.  Current Outpatient Medications (Respiratory):    albuterol (VENTOLIN HFA) 108 (90 Base) MCG/ACT inhaler, INHALE 2 PUFFS INTO LUNGS EVERY 4 HOURS AS NEEDED FOR WHEEZING OR SHORTNESS OF BREATH   fluticasone (FLONASE) 50 MCG/ACT nasal spray, SPRAY 2 SPRAYS INTO EACH NOSTRIL EVERY DAY   montelukast (SINGULAIR) 10 MG tablet, TAKE 1 TABLET BY MOUTH DAILY FOR ALLERGIES   pseudoephedrine (SUDAFED) 120 MG 12 hr tablet, Take  1 tablet  2 x /day (every 12 hours)  for Sinus & Chest Congestion  Current Outpatient Medications (Analgesics):    aspirin EC 81 MG tablet, Take 1 tablet (81 mg total) by mouth daily.   ibuprofen (ADVIL) 800 MG tablet, Take  1 tablet  3 x /day  with as needed for Pain & Inflammation (Patient not taking: Reported on 07/21/2021)   Current Outpatient Medications (Other):    acyclovir ointment (ZOVIRAX) 5 %, Apply 1 application topically every 3 (three) hours.   Adapalene-Benzoyl Peroxide (EPIDUO) 0.1-2.5 % gel, Apply 1 application topically as needed (acne).   Cholecalciferol (VITAMIN D PO), Take 5,000 Units by mouth daily.    escitalopram (LEXAPRO) 10 MG tablet, TAKE 1 TABLET DAILY FOR MOOD & ANXIETY   esomeprazole (NEXIUM) 40 MG capsule, Take  1 capsule  Daily  to Prevent Heartburn & Indigestion   Omega-3 Fatty Acids (FISH OIL PO), Take 1 capsule by mouth daily.   potassium chloride (KLOR-CON) 10 MEQ tablet, Take 1 tablet (10 mEq total) by mouth 2 (two) times daily.   topiramate (TOPAMAX) 50 MG tablet, TAKE 1/2-1 TABLET TWICE DAILY AT DINNER TIME & BEDTIME FOR DIETING & WEIGHT LOSS   valACYclovir (VALTREX) 500 MG tablet, Take  1 tablet  2 x /day as needed for Viral Suppression  Medical History:  Past  Medical History:  Diagnosis Date   Allergy    Anxiety    Asthma    Cardiomyopathy ([place])    a. Echo 8/16:  EF 40-45%, apical and ant-septal HK, Gr 2 DD   Diverticula, colon    Family history of adverse reaction to anesthesia    sisiter had PONV   Headache    rare migraines   History of cardiovascular stress test    Myoview 8/16:  EF 48%, anterior and apical defect c/w breast atten, no ischemia; Low Risk // Nuclear stress test 9/18: Low risk stress nuclear study with fixed septal defect consistent with LBBB vs prior infarct; no ischemia; EF 53 with septal akinesis.   Hyperlipidemia    Hypertension    Irregular periods/menstrual cycles    LBBB (left bundle branch block)    Melanoma (HCC)    right arm   Seasonal allergies    TIA (transient ischemic attack)  Torn meniscus    Allergies:  Allergies  Allergen Reactions   Metoprolol     Not allergy, but intolerance - had bradycardia with HR in the 40s even with low dose   Oxycodone Itching    Itching: Trunk, palms of hands, legs, back      Review of Systems  Constitutional:  Negative for malaise/fatigue and weight loss.  HENT: Negative.  Negative for hearing loss and tinnitus.   Eyes: Negative.  Negative for blurred vision and double vision.  Respiratory: Negative.  Negative for cough, shortness of breath and wheezing.   Cardiovascular: Negative.  Negative for chest pain, palpitations, orthopnea, claudication and leg swelling.  Gastrointestinal: Negative.  Negative for abdominal pain, blood in stool, constipation, diarrhea, heartburn, melena, nausea and vomiting.  Genitourinary: Negative.   Musculoskeletal:  Positive for joint pain (bil hips intermittently ). Negative for back pain and myalgias.  Skin: Negative.  Negative for rash.  Neurological: Negative.  Negative for dizziness, tingling, sensory change, weakness and headaches.  Endo/Heme/Allergies: Negative.  Negative for polydipsia.  Psychiatric/Behavioral: Negative.    All  other systems reviewed and are negative.    Family history- Review and unchanged Social history- Review and unchanged Physical Exam: BP 110/68   Pulse 68   Temp 97.7 F (36.5 C)   Wt 139 lb (63 kg)   SpO2 96%   BMI 26.26 kg/m  Wt Readings from Last 3 Encounters:  07/21/21 139 lb (63 kg)  03/12/21 140 lb 3.2 oz (63.6 kg)  01/02/21 140 lb 1 oz (63.5 kg)   General Appearance: Well nourished, in no apparent distress. Eyes: PERRLA, EOMs, conjunctiva no swelling or erythema Sinuses: No Frontal/maxillary tenderness ENT/Mouth: Ext aud canals clear, TMs without erythema, bulging. No erythema, swelling, or exudate on post pharynx.  Tonsils not swollen or erythematous. Hearing normal.  Neck: Supple, thyroid normal.  Respiratory: Respiratory effort normal, BS equal bilaterally without rales, rhonchi, wheezing or stridor.  Cardio: RRR with no MRGs. Brisk peripheral pulses without edema.  Abdomen: Soft, + BS.  Non tender, no guarding, rebound, hernias, masses. Lymphatics: Non tender without lymphadenopathy.  Musculoskeletal: Full ROM 5/5 strength, normal gait. Well healed surgical scars bil knees Skin: Warm, dry without rashes, lesions, ecchymosis.  Neuro: Cranial nerves intact. Normal muscle tone, no cerebellar symptoms. Sensation intact.  Psych: Awake and oriented X 3, normal affect, Insight and Judgment appropriate.    [facility]  07/21/2021

## 2021-07-21 ENCOUNTER — Ambulatory Visit: Payer: BC Managed Care – PPO | Admitting: Nurse Practitioner

## 2021-07-21 ENCOUNTER — Encounter: Payer: Self-pay | Admitting: Nurse Practitioner

## 2021-07-21 VITALS — BP 110/68 | HR 68 | Temp 97.7°F | Wt 139.0 lb

## 2021-07-21 DIAGNOSIS — E782 Mixed hyperlipidemia: Secondary | ICD-10-CM

## 2021-07-21 DIAGNOSIS — I5022 Chronic systolic (congestive) heart failure: Secondary | ICD-10-CM | POA: Diagnosis not present

## 2021-07-21 DIAGNOSIS — E559 Vitamin D deficiency, unspecified: Secondary | ICD-10-CM

## 2021-07-21 DIAGNOSIS — Z79899 Other long term (current) drug therapy: Secondary | ICD-10-CM

## 2021-07-21 DIAGNOSIS — E663 Overweight: Secondary | ICD-10-CM

## 2021-07-21 DIAGNOSIS — J45909 Unspecified asthma, uncomplicated: Secondary | ICD-10-CM

## 2021-07-21 DIAGNOSIS — C4362 Malignant melanoma of left upper limb, including shoulder: Secondary | ICD-10-CM

## 2021-07-21 DIAGNOSIS — G4730 Sleep apnea, unspecified: Secondary | ICD-10-CM

## 2021-07-21 DIAGNOSIS — I10 Essential (primary) hypertension: Secondary | ICD-10-CM

## 2021-07-21 DIAGNOSIS — R7309 Other abnormal glucose: Secondary | ICD-10-CM | POA: Diagnosis not present

## 2021-07-22 LAB — CBC WITH DIFFERENTIAL/PLATELET
Absolute Monocytes: 451 cells/uL (ref 200–950)
Basophils Absolute: 49 cells/uL (ref 0–200)
Basophils Relative: 0.8 %
Eosinophils Absolute: 128 cells/uL (ref 15–500)
Eosinophils Relative: 2.1 %
HCT: 44.7 % (ref 35.0–45.0)
Hemoglobin: 15.2 g/dL (ref 11.7–15.5)
Lymphs Abs: 1446 cells/uL (ref 850–3900)
MCH: 30.4 pg (ref 27.0–33.0)
MCHC: 34 g/dL (ref 32.0–36.0)
MCV: 89.4 fL (ref 80.0–100.0)
MPV: 13.5 fL — ABNORMAL HIGH (ref 7.5–12.5)
Monocytes Relative: 7.4 %
Neutro Abs: 4026 cells/uL (ref 1500–7800)
Neutrophils Relative %: 66 %
Platelets: 320 10*3/uL (ref 140–400)
RBC: 5 10*6/uL (ref 3.80–5.10)
RDW: 12.8 % (ref 11.0–15.0)
Total Lymphocyte: 23.7 %
WBC: 6.1 10*3/uL (ref 3.8–10.8)

## 2021-07-22 LAB — COMPLETE METABOLIC PANEL WITH GFR
AG Ratio: 2 (calc) (ref 1.0–2.5)
ALT: 18 U/L (ref 6–29)
AST: 16 U/L (ref 10–35)
Albumin: 4.3 g/dL (ref 3.6–5.1)
Alkaline phosphatase (APISO): 85 U/L (ref 37–153)
BUN: 17 mg/dL (ref 7–25)
CO2: 24 mmol/L (ref 20–32)
Calcium: 8.9 mg/dL (ref 8.6–10.4)
Chloride: 107 mmol/L (ref 98–110)
Creat: 0.8 mg/dL (ref 0.50–1.05)
Globulin: 2.2 g/dL (calc) (ref 1.9–3.7)
Glucose, Bld: 119 mg/dL — ABNORMAL HIGH (ref 65–99)
Potassium: 4.2 mmol/L (ref 3.5–5.3)
Sodium: 141 mmol/L (ref 135–146)
Total Bilirubin: 0.4 mg/dL (ref 0.2–1.2)
Total Protein: 6.5 g/dL (ref 6.1–8.1)
eGFR: 84 mL/min/{1.73_m2} (ref >=60)

## 2021-07-22 LAB — TSH: TSH: 1.8 mIU/L (ref 0.40–4.50)

## 2021-07-22 LAB — HEMOGLOBIN A1C
Hgb A1c MFr Bld: 5.7 % of total Hgb — ABNORMAL HIGH (ref <5.7)
Mean Plasma Glucose: 117 mg/dL
eAG (mmol/L): 6.5 mmol/L

## 2021-07-22 LAB — LIPID PANEL
Cholesterol: 169 mg/dL (ref <200)
HDL: 36 mg/dL — ABNORMAL LOW (ref >=50)
LDL Cholesterol (Calc): 95 mg/dL (calc)
Non-HDL Cholesterol (Calc): 133 mg/dL (calc) — ABNORMAL HIGH (ref <130)
Total CHOL/HDL Ratio: 4.7 (calc) (ref <5.0)
Triglycerides: 265 mg/dL — ABNORMAL HIGH (ref <150)

## 2021-08-11 DIAGNOSIS — M9901 Segmental and somatic dysfunction of cervical region: Secondary | ICD-10-CM | POA: Diagnosis not present

## 2021-08-27 ENCOUNTER — Encounter: Payer: Self-pay | Admitting: Nurse Practitioner

## 2021-08-27 ENCOUNTER — Ambulatory Visit: Payer: BC Managed Care – PPO | Admitting: Nurse Practitioner

## 2021-08-27 VITALS — BP 118/68 | HR 60 | Temp 98.2°F | Resp 17 | Ht 61.0 in | Wt 140.8 lb

## 2021-08-27 DIAGNOSIS — Z79899 Other long term (current) drug therapy: Secondary | ICD-10-CM

## 2021-08-27 DIAGNOSIS — M545 Low back pain, unspecified: Secondary | ICD-10-CM | POA: Diagnosis not present

## 2021-08-27 DIAGNOSIS — Z79890 Hormone replacement therapy (postmenopausal): Secondary | ICD-10-CM | POA: Diagnosis not present

## 2021-08-27 NOTE — Patient Instructions (Signed)
Acute Back Pain, Adult Acute back pain is sudden and usually short-lived. It is often caused by an injury to the muscles and tissues in the back. The injury may result from: A muscle, tendon, or ligament getting overstretched or torn. Ligaments are tissues that connect bones to each other. Lifting something improperly can cause a back strain. Wear and tear (degeneration) of the spinal disks. Spinal disks are circular tissue that provide cushioning between the bones of the spine (vertebrae). Twisting motions, such as while playing sports or doing yard work. A hit to the back. Arthritis. You may have a physical exam, lab tests, and imaging tests to find the cause of your pain. Acute back pain usually goes away with rest and home care. Follow these instructions at home: Managing pain, stiffness, and swelling Take over-the-counter and prescription medicines only as told by your health care provider. Treatment may include medicines for pain and inflammation that are taken by mouth or applied to the skin, or muscle relaxants. Your health care provider may recommend applying ice during the first 24-48 hours after your pain starts. To do this: Put ice in a plastic bag. Place a towel between your skin and the bag. Leave the ice on for 20 minutes, 2-3 times a day. Remove the ice if your skin turns bright red. This is very important. If you cannot feel pain, heat, or cold, you have a greater risk of damage to the area. If directed, apply heat to the affected area as often as told by your health care provider. Use the heat source that your health care provider recommends, such as a moist heat pack or a heating pad. Place a towel between your skin and the heat source. Leave the heat on for 20-30 minutes. Remove the heat if your skin turns bright red. This is especially important if you are unable to feel pain, heat, or cold. You have a greater risk of getting burned. Activity  Do not stay in bed. Staying in  bed for more than 1-2 days can delay your recovery. Sit up and stand up straight. Avoid leaning forward when you sit or hunching over when you stand. If you work at a desk, sit close to it so you do not need to lean over. Keep your chin tucked in. Keep your neck drawn back, and keep your elbows bent at a 90-degree angle (right angle). Sit high and close to the steering wheel when you drive. Add lower back (lumbar) support to your car seat, if needed. Take short walks on even surfaces as soon as you are able. Try to increase the length of time you walk each day. Do not sit, drive, or stand in one place for more than 30 minutes at a time. Sitting or standing for long periods of time can put stress on your back. Do not drive or use heavy machinery while taking prescription pain medicine. Use proper lifting techniques. When you bend and lift, use positions that put less stress on your back: Bend your knees. Keep the load close to your body. Avoid twisting. Exercise regularly as told by your health care provider. Exercising helps your back heal faster and helps prevent back injuries by keeping muscles strong and flexible. Work with a physical therapist to make a safe exercise program, as recommended by your health care provider. Do any exercises as told by your physical therapist. Lifestyle Maintain a healthy weight. Extra weight puts stress on your back and makes it difficult to have good   posture. Avoid activities or situations that make you feel anxious or stressed. Stress and anxiety increase muscle tension and can make back pain worse. Learn ways to manage anxiety and stress, such as through exercise. General instructions Sleep on a firm mattress in a comfortable position. Try lying on your side with your knees slightly bent. If you lie on your back, put a pillow under your knees. Keep your head and neck in a straight line with your spine (neutral position) when using electronic equipment like  smartphones or pads. To do this: Raise your smartphone or pad to look at it instead of bending your head or neck to look down. Put the smartphone or pad at the level of your face while looking at the screen. Follow your treatment plan as told by your health care provider. This may include: Cognitive or behavioral therapy. Acupuncture or massage therapy. Meditation or yoga. Contact a health care provider if: You have pain that is not relieved with rest or medicine. You have increasing pain going down into your legs or buttocks. Your pain does not improve after 2 weeks. You have pain at night. You lose weight without trying. You have a fever or chills. You develop nausea or vomiting. You develop abdominal pain. Get help right away if: You develop new bowel or bladder control problems. You have unusual weakness or numbness in your arms or legs. You feel faint. These symptoms may represent a serious problem that is an emergency. Do not wait to see if the symptoms will go away. Get medical help right away. Call your local emergency services (911 in the [place] Do not drive yourself to the hospital. Summary Acute back pain is sudden and usually short-lived. Use proper lifting techniques. When you bend and lift, use positions that put less stress on your back. Take over-the-counter and prescription medicines only as told by your health care provider, and apply heat or ice as told. This information is not intended to replace advice given to you by your health care provider. Make sure you discuss any questions you have with your health care provider. Document Revised: 03/22/2020 Document Reviewed: 03/22/2020 Elsevier Patient Education  2023 Elsevier Inc.  

## 2021-08-27 NOTE — Progress Notes (Unsigned)
Assessment and Plan:  Gail Bailey was seen today for an episodic visit.  Diagnoses and all order for this visit:  1. Acute left-sided low back pain without sciatica Discussed possible relation to HRT placement. Will obtain UA w/cx and advise/treat for any + infection. Continue to stay well hydrated. Muscle relaxer discussed pt declines at this time. Continue to monitor. Worsening symptoms including fever, chills, N/V, uncontrolled pain, blood in urine, report to ER.  - Urinalysis w microscopic + reflex cultur  2. Hormone replacement therapy Reach out to HRT provider to dicuss possible SE, removal if s/s fail to improve.   3. Medication management All medications discussed and reviewed in full. All questions and concerns regarding medications addressed.    Orders Placed This Encounter  Procedures   Urinalysis w microscopic + reflex cultur   REFLEXIVE URINE CULTURE   Notify office for further evaluation and treatment, questions or concerns if s/s fail to improve. The risks and benefits of my recommendations, as well as other treatment options were discussed with the patient today. Questions were answered.  Further disposition pending results of labs. Discussed med's effects and SE's.    Over 20 minutes of exam, counseling, chart review, and critical decision making was performed.   Future Appointments  Date Time Provider [place]  01/14/2022 11:00 AM Unk Pinto, MD [facility] None    ------------------------------------------------------------------------------------------------------------------   HPI BP 118/68   Pulse 60   Temp 98.2 F (36.8 C)   Resp 17   Ht '5\' 1"'$  (1.549 m)   Wt 140 lb 12.8 oz (63.9 kg)   SpO2 96%   BMI 26.60 kg/m   61 y.o.female presents for evaluation of right sided back pain.  States 3 days ago she rode for 6 hours on her motorcycle.  She feels as though this may have contributed to her back pain however, she is unsure if it is  the start of a UTI.  Has pain when lying on left side.  She is trying to stay well hydrated.  Denies dysuria, blood in urine, N/V, fever, chills, fatigue.  Recently 3 mo ago had placement of bio-identical testosterone hormone pellet replacement in that area.  Reports having SE in the past and is wondering if the pain is related.  Past Medical History:  Diagnosis Date   Allergy    Anxiety    Asthma    Cardiomyopathy ([place])    a. Echo 8/16:  EF 40-45%, apical and ant-septal HK, Gr 2 DD   Diverticula, colon    Family history of adverse reaction to anesthesia    sisiter had PONV   Headache    rare migraines   History of cardiovascular stress test    Myoview 8/16:  EF 48%, anterior and apical defect c/w breast atten, no ischemia; Low Risk // Nuclear stress test 9/18: Low risk stress nuclear study with fixed septal defect consistent with LBBB vs prior infarct; no ischemia; EF 53 with septal akinesis.   Hyperlipidemia    Hypertension    Irregular periods/menstrual cycles    LBBB (left bundle branch block)    Melanoma ([facility])    right arm   Seasonal allergies    TIA (transient ischemic attack)    Torn meniscus      Allergies  Allergen Reactions   Metoprolol     Not allergy, but intolerance - had bradycardia with HR in the 40s even with low dose   Oxycodone Itching    Itching: Trunk, palms of hands,  legs, back      Current Outpatient Medications on File Prior to Visit  Medication Sig   acyclovir ointment (ZOVIRAX) 5 % Apply 1 application topically every 3 (three) hours.   Adapalene-Benzoyl Peroxide (EPIDUO) 0.1-2.5 % gel Apply 1 application topically as needed (acne).   albuterol (VENTOLIN HFA) 108 (90 Base) MCG/ACT inhaler INHALE 2 PUFFS INTO LUNGS EVERY 4 HOURS AS NEEDED FOR WHEEZING OR SHORTNESS OF BREATH   amLODipine (NORVASC) 5 MG tablet TAKE 1 TABLET BY MOUTH EVERY DAY FOR BLOOD PRESSURE   aspirin EC 81 MG tablet Take 1 tablet (81 mg total) by mouth daily.   Cholecalciferol  (VITAMIN D PO) Take 5,000 Units by mouth daily.    escitalopram (LEXAPRO) 10 MG tablet TAKE 1 TABLET DAILY FOR MOOD & ANXIETY   esomeprazole (NEXIUM) 40 MG capsule Take  1 capsule  Daily  to Prevent Heartburn & Indigestion   estradiol (ESTRACE) 0.5 MG tablet Take 0.5 mg by mouth daily.   fluticasone (FLONASE) 50 MCG/ACT nasal spray SPRAY 2 SPRAYS INTO EACH NOSTRIL EVERY DAY   furosemide (LASIX) 40 MG tablet TAKE 1 TABLET EVERY DAY FOR BLOOD PRESSURE AND FLUID   medroxyPROGESTERone (PROVERA) 2.5 MG tablet Take 2.5 mg by mouth daily.   metoprolol succinate (TOPROL-XL) 25 MG 24 hr tablet TAKE 1/2 TABLET BY MOUTH EVERY DAY   montelukast (SINGULAIR) 10 MG tablet TAKE 1 TABLET BY MOUTH DAILY FOR ALLERGIES   olmesartan (BENICAR) 20 MG tablet TAKE 1 TABLET BY MOUTH DAILY. PLEASE KEEP UPCOMING APPOINTMENT IN MARCH 2023 FOR FUTURE REFILLS.   Omega-3 Fatty Acids (FISH OIL PO) Take 1 capsule by mouth daily.   potassium chloride (KLOR-CON) 10 MEQ tablet Take 1 tablet (10 mEq total) by mouth 2 (two) times daily.   pseudoephedrine (SUDAFED) 120 MG 12 hr tablet Take  1 tablet  2 x /day (every 12 hours)  for Sinus & Chest Congestion   rosuvastatin (CRESTOR) 10 MG tablet Take 1 tablet (10 mg total) by mouth daily.   topiramate (TOPAMAX) 50 MG tablet TAKE 1/2-1 TABLET TWICE DAILY AT DINNER TIME & BEDTIME FOR DIETING & WEIGHT LOSS   valACYclovir (VALTREX) 500 MG tablet Take  1 tablet  2 x /day as needed for Viral Suppression   No current facility-administered medications on file prior to visit.    ROS: all negative except what is noted in the HPI.   Physical Exam:  BP 118/68   Pulse 60   Temp 98.2 F (36.8 C)   Resp 17   Ht '5\' 1"'$  (1.549 m)   Wt 140 lb 12.8 oz (63.9 kg)   SpO2 96%   BMI 26.60 kg/m   General Appearance: NAD.  Awake, conversant and cooperative. Eyes: PERRLA, EOMs intact.  Sclera white.  Conjunctiva without erythema. Sinuses: No frontal/maxillary tenderness.  No nasal discharge. Nares  patent.  ENT/Mouth: Ext aud canals clear.  Bilateral TMs w/DOL and without erythema or bulging. Hearing intact.  Posterior pharynx without swelling or exudate.  Tonsils without swelling or erythema.  Neck: Supple.  No masses, nodules or thyromegaly. Respiratory: Effort is regular with non-labored breathing. Breath sounds are equal bilaterally without rales, rhonchi, wheezing or stridor.  Cardio: RRR with no MRGs. Brisk peripheral pulses without edema.  Abdomen: Active BS in all four quadrants.  Soft and non-tender without guarding, rebound tenderness, hernias or masses. Lymphatics: Non tender without lymphadenopathy.  Musculoskeletal: Left lower back with tenderness to palpation. Full ROM, 5/5 strength, normal ambulation.  Skin: Appropriate  color for ethnicity. Warm without rashes, lesions, ecchymosis, ulcers.  Neuro: CN II-XII grossly normal. Normal muscle tone without cerebellar symptoms and intact sensation.   Psych: AO X 3,  appropriate mood and affect, insight and judgment.     Darrol Jump, NP 4:28 PM [facility]

## 2021-08-28 ENCOUNTER — Encounter: Payer: Self-pay | Admitting: Nurse Practitioner

## 2021-08-28 ENCOUNTER — Other Ambulatory Visit: Payer: Self-pay | Admitting: Nurse Practitioner

## 2021-08-28 LAB — URINALYSIS W MICROSCOPIC + REFLEX CULTURE
Bilirubin Urine: NEGATIVE
Glucose, UA: NEGATIVE
Hgb urine dipstick: NEGATIVE
Hyaline Cast: NONE SEEN /LPF
Ketones, ur: NEGATIVE
Leukocyte Esterase: NEGATIVE
Nitrites, Initial: NEGATIVE
Protein, ur: NEGATIVE
RBC / HPF: NONE SEEN /HPF (ref 0–2)
Specific Gravity, Urine: 1.015 (ref 1.001–1.035)
WBC, UA: NONE SEEN /HPF (ref 0–5)
pH: 6.5 (ref 5.0–8.0)

## 2021-08-28 LAB — NO CULTURE INDICATED

## 2021-08-28 MED ORDER — NITROFURANTOIN MONOHYD MACRO 100 MG PO CAPS
100.0000 mg | ORAL_CAPSULE | Freq: Two times a day (BID) | ORAL | 0 refills | Status: AC
Start: 2021-08-28 — End: 2021-09-02

## 2021-08-31 ENCOUNTER — Other Ambulatory Visit: Payer: Self-pay | Admitting: Nurse Practitioner

## 2021-08-31 DIAGNOSIS — M545 Low back pain, unspecified: Secondary | ICD-10-CM

## 2021-08-31 DIAGNOSIS — F411 Generalized anxiety disorder: Secondary | ICD-10-CM

## 2021-08-31 DIAGNOSIS — Z79890 Hormone replacement therapy (postmenopausal): Secondary | ICD-10-CM

## 2021-08-31 DIAGNOSIS — R8271 Bacteriuria: Secondary | ICD-10-CM

## 2021-09-01 ENCOUNTER — Ambulatory Visit
Admission: RE | Admit: 2021-09-01 | Discharge: 2021-09-01 | Disposition: A | Discharge status: Home / Self Care | Payer: BC Managed Care – PPO | Source: Ambulatory Visit | Attending: Nurse Practitioner | Admitting: Nurse Practitioner

## 2021-09-01 DIAGNOSIS — R8271 Bacteriuria: Secondary | ICD-10-CM

## 2021-09-01 DIAGNOSIS — R109 Unspecified abdominal pain: Secondary | ICD-10-CM | POA: Diagnosis not present

## 2021-09-01 DIAGNOSIS — Z79890 Hormone replacement therapy (postmenopausal): Secondary | ICD-10-CM

## 2021-09-01 DIAGNOSIS — M9901 Segmental and somatic dysfunction of cervical region: Secondary | ICD-10-CM | POA: Diagnosis not present

## 2021-09-01 DIAGNOSIS — M545 Low back pain, unspecified: Secondary | ICD-10-CM

## 2021-09-01 MED ORDER — CYCLOBENZAPRINE HCL 10 MG PO TABS: ORAL_TABLET | ORAL | 0 refills | Status: DC

## 2021-09-02 DIAGNOSIS — L570 Actinic keratosis: Secondary | ICD-10-CM | POA: Diagnosis not present

## 2021-09-02 DIAGNOSIS — L821 Other seborrheic keratosis: Secondary | ICD-10-CM | POA: Diagnosis not present

## 2021-09-02 DIAGNOSIS — D229 Melanocytic nevi, unspecified: Secondary | ICD-10-CM | POA: Diagnosis not present

## 2021-09-08 DIAGNOSIS — M9901 Segmental and somatic dysfunction of cervical region: Secondary | ICD-10-CM | POA: Diagnosis not present

## 2021-09-09 ENCOUNTER — Other Ambulatory Visit: Payer: Self-pay | Admitting: Internal Medicine

## 2021-09-09 ENCOUNTER — Encounter: Payer: Self-pay | Admitting: Internal Medicine

## 2021-09-09 DIAGNOSIS — Z79899 Other long term (current) drug therapy: Secondary | ICD-10-CM

## 2021-09-09 MED ORDER — TOPIRAMATE 50 MG PO TABS: ORAL_TABLET | ORAL | 3 refills | Status: DC

## 2021-10-06 ENCOUNTER — Encounter: Payer: Self-pay | Admitting: Internal Medicine

## 2021-10-07 ENCOUNTER — Other Ambulatory Visit: Payer: Self-pay

## 2021-10-07 ENCOUNTER — Encounter: Payer: Self-pay | Admitting: Gastroenterology

## 2021-10-07 ENCOUNTER — Encounter: Payer: Self-pay | Admitting: Nurse Practitioner

## 2021-10-07 DIAGNOSIS — Z1211 Encounter for screening for malignant neoplasm of colon: Secondary | ICD-10-CM

## 2021-10-07 LAB — POC HEMOCCULT BLD/STL (HOME/3-CARD/SCREEN)
Card #2 Fecal Occult Blod, POC: NEGATIVE
Card #3 Fecal Occult Blood, POC: NEGATIVE
Fecal Occult Blood, POC: NEGATIVE

## 2021-10-10 ENCOUNTER — Other Ambulatory Visit: Payer: Self-pay | Admitting: Nurse Practitioner

## 2021-10-10 DIAGNOSIS — R5383 Other fatigue: Secondary | ICD-10-CM

## 2021-10-10 DIAGNOSIS — R197 Diarrhea, unspecified: Secondary | ICD-10-CM

## 2021-10-10 DIAGNOSIS — M545 Low back pain, unspecified: Secondary | ICD-10-CM

## 2021-10-10 DIAGNOSIS — K2100 Gastro-esophageal reflux disease with esophagitis, without bleeding: Secondary | ICD-10-CM

## 2021-11-10 ENCOUNTER — Other Ambulatory Visit: Payer: BC Managed Care – PPO

## 2021-11-10 ENCOUNTER — Ambulatory Visit: Payer: BC Managed Care – PPO | Admitting: Gastroenterology

## 2021-11-10 ENCOUNTER — Encounter: Payer: Self-pay | Admitting: Gastroenterology

## 2021-11-10 VITALS — BP 100/80 | HR 62 | Ht 60.0 in | Wt 132.1 lb

## 2021-11-10 DIAGNOSIS — R197 Diarrhea, unspecified: Secondary | ICD-10-CM | POA: Insufficient documentation

## 2021-11-10 DIAGNOSIS — Z0389 Encounter for observation for other suspected diseases and conditions ruled out: Secondary | ICD-10-CM | POA: Diagnosis not present

## 2021-11-10 DIAGNOSIS — A048 Other specified bacterial intestinal infections: Secondary | ICD-10-CM | POA: Diagnosis not present

## 2021-11-10 DIAGNOSIS — R1084 Generalized abdominal pain: Secondary | ICD-10-CM | POA: Diagnosis not present

## 2021-11-10 MED ORDER — DIPHENOXYLATE-ATROPINE 2.5-0.025 MG PO TABS: 1.0000 | ORAL_TABLET | Freq: Four times a day (QID) | ORAL | 1 refills | Status: DC | PRN

## 2021-11-10 MED ORDER — HYOSCYAMINE SULFATE 0.125 MG SL SUBL
0.1250 mg | SUBLINGUAL_TABLET | Freq: Four times a day (QID) | SUBLINGUAL | 1 refills | Status: DC | PRN
Start: 2021-11-10 — End: 2022-04-30

## 2021-11-10 NOTE — Progress Notes (Signed)
11/10/2021 Gail Bailey [id number] 03/29/1960   HISTORY OF PRESENT ILLNESS:  This is a 61 year old female who is a patient of Dr. Doyne Keel, last seen here in 2019 for colonoscopy at which time she was found to have 1 hyperplastic polyp removed, diverticulosis, and internal hemorrhoids.  She is here today with complaints of diarrhea and abdominal cramping that was rather sudden in onset in mid-August.  She says that she gets severe mid abdominal cramps.  She says that she cannot keep any food in as everything she eats gives her diarrhea.  She has had some nausea and a couple episodes of vomiting.  Does not recall being on any antibiotics, but did take a course of Diflucan for yeast infection.  She does a lot of traveling for work, but not typically out of the country.  She was in [place], but that was mid-September after all of this had already started.  Said that prior to all of this she was doing just fine.  She has been using probiotics, drinking liquid IV to stay hydrated, and using Imodium without any improvement.  She says that all her stools are just like water, gets up a couple times every night to have a BM.  Has not had any normal bowel movements since this began.  She has lost about 12 pounds.  She says that otherwise she is not on any new medications except for Nexium that her PCP placed her on for complaints of indigestion and heartburn.  Referred by Darrol Jump, NP for evaluation of diarrhea, GERD, abdominal pain, etc.  Past Medical History:  Diagnosis Date   Allergy    Anxiety    Asthma    Cardiomyopathy ([place])    a. Echo 8/16:  EF 40-45%, apical and ant-septal HK, Gr 2 DD   Diverticula, colon    Family history of adverse reaction to anesthesia    sisiter had PONV   Headache    rare migraines   History of cardiovascular stress test    Myoview 8/16:  EF 48%, anterior and apical defect c/w breast atten, no ischemia; Low Risk // Nuclear stress test 9/18: Low risk stress  nuclear study with fixed septal defect consistent with LBBB vs prior infarct; no ischemia; EF 53 with septal akinesis.   Hyperlipidemia    Hypertension    Irregular periods/menstrual cycles    LBBB (left bundle branch block)    Melanoma ([facility])    right arm   Seasonal allergies    TIA (transient ischemic attack)    Torn meniscus    Past Surgical History:  Procedure Laterality Date   ABLATION     BREAST REDUCTION SURGERY     CARDIAC CATHETERIZATION     CESAREAN SECTION     x2   KNEE SURGERY     partial knee replacement right   MELANOMA EXCISION WITH SENTINEL LYMPH NODE BIOPSY Right 09/15/2017   Procedure: BIOPSY RIGHT SHOULDER MOLE AND WIDE LOCAL EXCISION OF RIGHT ARM MELANOMA, ADVANCEMENT FLAP CLOSURE, SENTINEL LYMPH NODE BIOPSY  RIGHT ARM MELANOMA;  Surgeon: Stark Klein, MD;  Location: [facility];  Service: General;  Laterality: Right;   REDUCTION MAMMAPLASTY Bilateral    REPAIR QUADRICEPS / HAMSTRING MUSCLE     REPLACEMENT TOTAL KNEE Left 09/20/2018   Dr. Okey Dupre at [place]     tubes in ears  10/12    reports that she has never smoked. She has never used smokeless tobacco. She  reports current alcohol use. She reports that she does not use drugs. family history includes Asthma in her sister and son; Breast cancer in her maternal grandmother; Cancer in her maternal grandmother; Diabetes in her mother; Heart attack in her paternal grandmother; Heart attack (age of onset: 52) in her mother; Heart disease in her mother; Hypertension in her mother; Other in her father. Allergies  Allergen Reactions   Metoprolol     Not allergy, but intolerance - had bradycardia with HR in the 40s even with low dose   Oxycodone Itching    Itching: Trunk, palms of hands, legs, back        Outpatient Encounter Medications as of 11/10/2021  Medication Sig   acyclovir ointment (ZOVIRAX) 5 % Apply 1 application topically every 3 (three) hours.   Adapalene-Benzoyl Peroxide  (EPIDUO) 0.1-2.5 % gel Apply 1 application topically as needed (acne).   albuterol (VENTOLIN HFA) 108 (90 Base) MCG/ACT inhaler INHALE 2 PUFFS INTO LUNGS EVERY 4 HOURS AS NEEDED FOR WHEEZING OR SHORTNESS OF BREATH   amLODipine (NORVASC) 5 MG tablet TAKE 1 TABLET BY MOUTH EVERY DAY FOR BLOOD PRESSURE   aspirin EC 81 MG tablet Take 1 tablet (81 mg total) by mouth daily.   Cholecalciferol (VITAMIN D PO) Take 5,000 Units by mouth daily.    diphenoxylate-atropine (LOMOTIL) 2.5-0.025 MG tablet Take 1 tablet by mouth 4 (four) times daily as needed for diarrhea or loose stools.   escitalopram (LEXAPRO) 10 MG tablet TAKE 1 TABLET DAILY FOR MOOD & ANXIETY   esomeprazole (NEXIUM) 40 MG capsule Take  1 capsule  Daily  to Prevent Heartburn & Indigestion   estradiol (ESTRACE) 0.5 MG tablet Take 0.5 mg by mouth daily.   fluticasone (FLONASE) 50 MCG/ACT nasal spray SPRAY 2 SPRAYS INTO EACH NOSTRIL EVERY DAY   furosemide (LASIX) 40 MG tablet TAKE 1 TABLET EVERY DAY FOR BLOOD PRESSURE AND FLUID   hyoscyamine (LEVSIN SL) 0.125 MG SL tablet Place 1 tablet (0.125 mg total) under the tongue every 6 (six) hours as needed.   medroxyPROGESTERone (PROVERA) 2.5 MG tablet Take 2.5 mg by mouth daily.   metoprolol succinate (TOPROL-XL) 25 MG 24 hr tablet TAKE 1/2 TABLET BY MOUTH EVERY DAY   montelukast (SINGULAIR) 10 MG tablet TAKE 1 TABLET BY MOUTH DAILY FOR ALLERGIES   olmesartan (BENICAR) 20 MG tablet TAKE 1 TABLET BY MOUTH DAILY. PLEASE KEEP UPCOMING APPOINTMENT IN MARCH 2023 FOR FUTURE REFILLS.   Omega-3 Fatty Acids (FISH OIL PO) Take 1 capsule by mouth daily.   potassium chloride (KLOR-CON) 10 MEQ tablet Take 1 tablet (10 mEq total) by mouth 2 (two) times daily.   rosuvastatin (CRESTOR) 10 MG tablet Take 1 tablet (10 mg total) by mouth daily.   topiramate (TOPAMAX) 50 MG tablet Take 1 tablet  2 x /day  at Suppertime & Bedtime  for Dieting & Weight Loss   valACYclovir (VALTREX) 500 MG tablet Take  1 tablet  2 x /day as  needed for Viral Suppression   cyclobenzaprine (FLEXERIL) 10 MG tablet Take 1/2 to 1 tablet 3 x /day as needed for Muscle Spasm (Patient not taking: Reported on 11/10/2021)   pseudoephedrine (SUDAFED) 120 MG 12 hr tablet Take  1 tablet  2 x /day (every 12 hours)  for Sinus & Chest Congestion (Patient not taking: Reported on 11/10/2021)   No facility-administered encounter medications on file as of 11/10/2021.     REVIEW OF SYSTEMS  : All other systems reviewed and negative except  where noted in the History of Present Illness.   PHYSICAL EXAM: BP 100/80   Pulse 62   Ht 5' (1.524 m)   Wt 132 lb 2 oz (59.9 kg)   BMI 25.80 kg/m  General: Well developed white female in no acute distress Head: Normocephalic and atraumatic Eyes:  Sclerae anicteric, conjunctiva pink. Ears: Normal auditory acuity Lungs: Clear throughout to auscultation; no W/R/R. Heart: Regular rate and rhythm; no M/R/G. Abdomen: Soft, non-distended.  BS present.  Mild diffuse TTP. Musculoskeletal: Symmetrical with no gross deformities  Skin: No lesions on visible extremities Extremities: No edema  Neurological: Alert oriented x 4, grossly non-focal Psychological:  Alert and cooperative. Normal mood and affect  ASSESSMENT AND PLAN: *61 year old female with complaints of diarrhea and abdominal cramping since mid August.  Has had some nausea and a couple episodes of vomiting as well.  No relief with Imodium.  Describes watery stools, even nocturnally.  She travels a lot for work, but not out of the country.  She was in [place], [place] in mid September, but that was well before all of this started.  No new medications.  I think we need to rule out infectious source although this would be prolonged, but check for C. difficile and Giardia, etc.  We will give Lomotil and Levsin to try to manage her symptoms for now as well.  Prescriptions sent to pharmacy.  We will consider CT scan versus colonoscopy pending results of the stool  studies.   CC:  Unk Pinto, MD CC:  Darrol Jump, NP

## 2021-11-10 NOTE — Progress Notes (Signed)
Agree with assessment and plan as outlined.  

## 2021-11-10 NOTE — Patient Instructions (Signed)
Your provider has requested that you go to the basement level for lab work before leaving today. Press "B" on the elevator. The lab is located at the first door on the left as you exit the elevator.   We have sent the following medications to your pharmacy for you to pick up at your convenience: Levsin, Lomotil   _______________________________________________________  If you are age 61 or older, your body mass index should be between 23-30. Your Body mass index is 25.8 kg/m. If this is out of the aforementioned range listed, please consider follow up with your Primary Care Provider.  If you are age 1 or younger, your body mass index should be between 19-25. Your Body mass index is 25.8 kg/m. If this is out of the aformentioned range listed, please consider follow up with your Primary Care Provider.   ________________________________________________________  The [facility] providers would like to encourage you to use [facility] to communicate with providers for non-urgent requests or questions.  Due to long hold times on the telephone, sending your provider a message by [facility] may be a faster and more efficient way to get a response.  Please allow 48 business hours for a response.  Please remember that this is for non-urgent requests.  _______________________________________________________  Thank you for choosing me and [facility].  Alonza Bogus PA

## 2021-11-12 DIAGNOSIS — R197 Diarrhea, unspecified: Secondary | ICD-10-CM

## 2021-11-12 DIAGNOSIS — R1084 Generalized abdominal pain: Secondary | ICD-10-CM

## 2021-11-12 LAB — GI PROFILE, STOOL, PCR

## 2021-11-12 NOTE — Telephone Encounter (Signed)
Gail Bailey see the message from the pt. I did make her aware that you have not reviewed the results.

## 2021-11-14 ENCOUNTER — Telehealth: Payer: Self-pay

## 2021-11-14 NOTE — Telephone Encounter (Signed)
Let's go ahead and schedule colonoscopy with [facility].  I am actually thinking about a CT scan as well because of the pain.  Please confirm, abdominal pain comes and goes in cramping type waves, not present all the time persistently?  Let me know and I will decide about CT scan.  I may want CT before colonoscopy if I decide it is necessary.       You routed conversation to Zehr, Laban Emperor, PA-C2 days ago   You2 days ago    Janett Billow see the message from the pt. I did make her aware that you have not reviewed the results.      Note   You  Jyllian Haynie days ago    Janett Billow has not reviewed the results as of yet. As soon as she does we will let you know what the next steps are.      [name]  P [facility] (supporting Loralie Champagne, PA-C)2 days ago    Hi  Based on the negative test results what is our next steps. I am traveling Nov7-12,  [id number] and Nov19-20. If we need to do a colonoscopy, I would like to get it done as quickly as possible. The pain in my back has gotten worse. Thank you

## 2021-11-17 MED ORDER — NA SULFATE-K SULFATE-MG SULF 17.5-3.13-1.6 GM/177ML PO SOLN: 1.0000 | Freq: Once | ORAL | 0 refills | Status: AC

## 2021-11-17 NOTE — Telephone Encounter (Signed)
Patient was scheduled for colon on 11/17 at 8:00 and is needing prep instructions. Please advise.

## 2021-11-17 NOTE — Addendum Note (Signed)
Addended by: Timothy Lasso on: 11/17/2021 04:58 PM   Modules accepted: Orders

## 2021-11-17 NOTE — Telephone Encounter (Signed)
Gail Bailey the pt is calling to set up the Colon with Dr Havery Moros. She did comment about her pain and previous CT scan. See her message.

## 2021-11-18 NOTE — Telephone Encounter (Signed)
The pt has appt with Dr Havery Moros as requested. See My Chart message for further information

## 2021-11-21 ENCOUNTER — Encounter: Payer: Self-pay | Admitting: Internal Medicine

## 2021-11-22 ENCOUNTER — Encounter: Payer: Self-pay | Admitting: Certified Registered Nurse Anesthetist

## 2021-11-24 ENCOUNTER — Encounter: Payer: Self-pay | Admitting: Gastroenterology

## 2021-11-27 ENCOUNTER — Other Ambulatory Visit: Payer: Self-pay | Admitting: Cardiovascular Disease

## 2021-11-27 MED ORDER — OLMESARTAN MEDOXOMIL 20 MG PO TABS: ORAL_TABLET | ORAL | 1 refills | Status: DC

## 2021-11-28 ENCOUNTER — Ambulatory Visit (AMBULATORY_SURGERY_CENTER): Payer: BC Managed Care – PPO | Admitting: Gastroenterology

## 2021-11-28 ENCOUNTER — Encounter: Payer: Self-pay | Admitting: Gastroenterology

## 2021-11-28 VITALS — BP 116/62 | HR 52 | Temp 97.8°F | Resp 11 | Ht 60.0 in | Wt 132.0 lb

## 2021-11-28 DIAGNOSIS — R197 Diarrhea, unspecified: Secondary | ICD-10-CM | POA: Diagnosis not present

## 2021-11-28 DIAGNOSIS — K52832 Lymphocytic colitis: Secondary | ICD-10-CM | POA: Diagnosis not present

## 2021-11-28 DIAGNOSIS — K649 Unspecified hemorrhoids: Secondary | ICD-10-CM

## 2021-11-28 DIAGNOSIS — K5730 Diverticulosis of large intestine without perforation or abscess without bleeding: Secondary | ICD-10-CM | POA: Diagnosis not present

## 2021-11-28 MED ORDER — SODIUM CHLORIDE 0.9 % IV SOLN: 500.0000 mL | Freq: Once | INTRAVENOUS | Status: DC

## 2021-11-28 NOTE — Progress Notes (Signed)
Pt's states no medical or surgical changes since previsit or office visit. 

## 2021-11-28 NOTE — Progress Notes (Signed)
Called to room to assist during endoscopic procedure.  Patient ID and intended procedure confirmed with present staff. Received instructions for my participation in the procedure from the performing physician.  

## 2021-11-28 NOTE — Progress Notes (Signed)
History and Physical Interval Note: Seen on 11/10/21 - persistent loose stools. GI pathogen panel negative. Colonoscopy to further evaluate, last exam in 2019. Otherwise no interval changes since office visit.    11/28/2021 8:03 AM  Gail Bailey  has presented today for endoscopic procedure(s), with the diagnosis of  Encounter Diagnosis  Name Primary?   Diarrhea, unspecified type Yes  .  The various methods of evaluation and treatment have been discussed with the patient and/or family. After consideration of risks, benefits and other options for treatment, the patient has consented to  the endoscopic procedure(s).   The patient's history has been reviewed, patient examined, no change in status, stable for surgery.  I have reviewed the patient's chart and labs.  Questions were answered to the patient's satisfaction.    Jolly Mango, MD [facility]

## 2021-11-28 NOTE — Patient Instructions (Signed)
Handouts on diverticulosis and hemorrhoids given to you today Await pathology results   YOU HAD AN ENDOSCOPIC PROCEDURE TODAY AT [facility]:   Refer to the procedure report that was given to you for any specific questions about what was found during the examination.  If the procedure report does not answer your questions, please call your gastroenterologist to clarify.  If you requested that your care partner not be given the details of your procedure findings, then the procedure report has been included in a sealed envelope for you to review at your convenience later.  YOU SHOULD EXPECT: Some feelings of bloating in the abdomen. Passage of more gas than usual.  Walking can help get rid of the air that was put into your GI tract during the procedure and reduce the bloating. If you had a lower endoscopy (such as a colonoscopy or flexible sigmoidoscopy) you may notice spotting of blood in your stool or on the toilet paper. If you underwent a bowel prep for your procedure, you may not have a normal bowel movement for a few days.  Please Note:  You might notice some irritation and congestion in your nose or some drainage.  This is from the oxygen used during your procedure.  There is no need for concern and it should clear up in a day or so.  SYMPTOMS TO REPORT IMMEDIATELY:  Following lower endoscopy (colonoscopy or flexible sigmoidoscopy):  Excessive amounts of blood in the stool  Significant tenderness or worsening of abdominal pains  Swelling of the abdomen that is new, acute  Fever of 100F or higher  For urgent or emergent issues, a gastroenterologist can be reached at any hour by calling [phone]. Do not use MyChart messaging for urgent concerns.    DIET:  We do recommend a small meal at first, but then you may proceed to your regular diet.  Drink plenty of fluids but you should avoid alcoholic beverages for 24 hours.  ACTIVITY:  You should plan to take it easy for  the rest of today and you should NOT DRIVE or use heavy machinery until tomorrow (because of the sedation medicines used during the test).    FOLLOW UP: Our staff will call the number listed on your records the next business day following your procedure.  We will call around 7:15- 8:00 am to check on you and address any questions or concerns that you may have regarding the information given to you following your procedure. If we do not reach you, we will leave a message.     If any biopsies were taken you will be contacted by phone or by letter within the next 1-3 weeks.  Please call us at [phone] if you have not heard about the biopsies in 3 weeks.    SIGNATURES/CONFIDENTIALITY: You and/or your care partner have signed paperwork which will be entered into your electronic medical record.  These signatures attest to the fact that that the information above on your After Visit Summary has been reviewed and is understood.  Full responsibility of the confidentiality of this discharge information lies with you and/or your care-partner.

## 2021-11-28 NOTE — Progress Notes (Signed)
Report given to PACU, vss 

## 2021-11-28 NOTE — Op Note (Signed)
[facility] Patient Name: Gail Bailey Procedure Date: 11/28/2021 7:48 AM MRN: [id number] Endoscopist: Remo Lipps P. Havery Moros , MD, [id number] Age: 61 Referring MD:  Date of Birth: 1960/08/21 Gender: Female Account #: [id number] Procedure:                Colonoscopy Indications:              Clinically significant diarrhea of unexplained                            origin, negative infectious workup, persistent                            symptoms Medicines:                Monitored Anesthesia Care Procedure:                Pre-Anesthesia Assessment:                           - Prior to the procedure, a History and Physical                            was performed, and patient medications and                            allergies were reviewed. The patient's tolerance of                            previous anesthesia was also reviewed. The risks                            and benefits of the procedure and the sedation                            options and risks were discussed with the patient.                            All questions were answered, and informed consent                            was obtained. Prior Anticoagulants: The patient has                            taken no anticoagulant or antiplatelet agents. ASA                            Grade Assessment: III - A patient with severe                            systemic disease. After reviewing the risks and                            benefits, the patient was deemed in satisfactory  condition to undergo the procedure.                           After obtaining informed consent, the colonoscope                            was passed under direct vision. Throughout the                            procedure, the patient's blood pressure, pulse, and                            oxygen saturations were monitored continuously. The                            Olympus PCF-H190DL (#[id number]) Colonoscope  was                            introduced through the anus and advanced to the the                            terminal ileum, with identification of the                            appendiceal orifice and IC valve. The colonoscopy                            was performed without difficulty. The patient                            tolerated the procedure well. The quality of the                            bowel preparation was good. The terminal ileum,                            ileocecal valve, appendiceal orifice, and rectum                            were photographed. Scope In: 8:16:28 AM Scope Out: 8:35:58 AM Scope Withdrawal Time: 0 hours 14 minutes 55 seconds  Total Procedure Duration: 0 hours 19 minutes 30 seconds  Findings:                 The perianal and digital rectal examinations were                            normal.                           The terminal ileum appeared normal.                           Scattered small-mouthed diverticula were found in  the ascending colon and left colon.                           Internal hemorrhoids were found during                            retroflexion. The hemorrhoids were small.                           The exam was otherwise without abnormality.                           Biopsies for histology were taken with a cold                            forceps from the right colon, left colon and                            transverse colon for evaluation of microscopic                            colitis. Complications:            No immediate complications. Estimated blood loss:                            Minimal. Estimated Blood Loss:     Estimated blood loss was minimal. Impression:               - The examined portion of the ileum was normal.                           - Diverticulosis in the ascending colon and in the                            left colon.                           - Internal hemorrhoids.                            - The examination was otherwise normal.                           - Biopsies were taken with a cold forceps from the                            right colon, left colon and transverse colon for                            evaluation of microscopic colitis. Recommendation:           - Patient has a contact number available for                            emergencies. The signs and symptoms of potential  delayed complications were discussed with the                            patient. Return to normal activities tomorrow.                            Written discharge instructions were provided to the                            patient.                           - Resume previous diet.                           - Continue present medications.                           - Await pathology results with further                            recommendations Remo Lipps P. Jamaia Brum, MD 11/28/2021 8:41:03 AM This report has been signed electronically.

## 2021-12-01 ENCOUNTER — Telehealth: Payer: Self-pay

## 2021-12-01 NOTE — Telephone Encounter (Signed)
  Follow up Call-     11/28/2021    7:21 AM  Call back number  Post procedure Call Back phone  # [phone]  Permission to leave phone message Yes     Patient questions:  Do you have a fever, pain , or abdominal swelling? No. Pain Score  0 *  Have you tolerated food without any problems? Yes.    Have you been able to return to your normal activities? Yes.    Do you have any questions about your discharge instructions: Diet   No. Medications  No. Follow up visit  No.  Do you have questions or concerns about your Care? No.  Actions: * If pain score is 4 or above: No action needed, pain <4.

## 2021-12-07 ENCOUNTER — Encounter: Payer: Self-pay | Admitting: Gastroenterology

## 2021-12-10 ENCOUNTER — Other Ambulatory Visit: Payer: Self-pay

## 2021-12-10 DIAGNOSIS — K52832 Lymphocytic colitis: Secondary | ICD-10-CM

## 2021-12-10 DIAGNOSIS — R1084 Generalized abdominal pain: Secondary | ICD-10-CM

## 2021-12-10 MED ORDER — BUDESONIDE 3 MG PO CPEP: ORAL_CAPSULE | ORAL | 0 refills | Status: AC

## 2021-12-12 ENCOUNTER — Other Ambulatory Visit: Payer: Self-pay | Admitting: Internal Medicine

## 2021-12-12 ENCOUNTER — Other Ambulatory Visit: Payer: BC Managed Care – PPO

## 2021-12-12 DIAGNOSIS — R1084 Generalized abdominal pain: Secondary | ICD-10-CM

## 2021-12-12 DIAGNOSIS — K52832 Lymphocytic colitis: Secondary | ICD-10-CM

## 2021-12-13 LAB — IGA: Immunoglobulin A: 164 mg/dL (ref 47–310)

## 2021-12-13 LAB — TISSUE TRANSGLUTAMINASE ABS,IGG,IGA
(tTG) Ab, IgA: <1 U/mL
(tTG) Ab, IgG: <1 U/mL

## 2021-12-15 ENCOUNTER — Ambulatory Visit (HOSPITAL_COMMUNITY)
Admission: RE | Admit: 2021-12-15 | Discharge: 2021-12-15 | Disposition: A | Discharge status: Home / Self Care | Payer: BC Managed Care – PPO | Source: Ambulatory Visit | Attending: Gastroenterology | Admitting: Gastroenterology

## 2021-12-15 DIAGNOSIS — K52832 Lymphocytic colitis: Secondary | ICD-10-CM | POA: Insufficient documentation

## 2021-12-15 DIAGNOSIS — R1084 Generalized abdominal pain: Secondary | ICD-10-CM | POA: Diagnosis not present

## 2021-12-15 DIAGNOSIS — R109 Unspecified abdominal pain: Secondary | ICD-10-CM | POA: Diagnosis not present

## 2021-12-15 MED ORDER — IOHEXOL 300 MG/ML  SOLN
100.0000 mL | Freq: Once | INTRAMUSCULAR | Status: AC | PRN
  Administered 2021-12-15: 100 mL via INTRAVENOUS

## 2021-12-15 MED ORDER — IOHEXOL 9 MG/ML PO SOLN
ORAL | Status: AC
  Filled 2021-12-15: qty 1000

## 2021-12-15 MED ORDER — IOHEXOL 9 MG/ML PO SOLN
500.0000 mL | ORAL | Status: AC
  Administered 2021-12-15 (×2): 500 mL via ORAL

## 2021-12-19 ENCOUNTER — Telehealth: Payer: Self-pay

## 2021-12-19 NOTE — Patient Outreach (Signed)
  Care Coordination   Initial Visit Note   12/19/2021 Name: Gail Bailey MRN: [id number] DOB: 07/25/1960  Gail Bailey is a 61 y.o. year old female who sees Unk Pinto, MD for primary care. I spoke with  [name] by phone today.  What matters to the patients health and wellness today?  Working with GI to figure out stomach issues    Goals Addressed             This Visit's Progress    COMPLETED: Care Coordination Activities-No follow up required       Care Coordination Interventions: Advised patient to schedule annual exam.  Discussed [facility] services and support. Patient decline          SDOH assessments and interventions completed:  Yes  SDOH Interventions Today    Flowsheet Row Most Recent Value  SDOH Interventions   Food Insecurity Interventions Intervention Not Indicated  Housing Interventions Intervention Not Indicated        Care Coordination Interventions:  Yes, provided   Follow up plan: No further intervention required.   Encounter Outcome:  Pt. Visit Completed   Jone Baseman, RN, MSN [facility] Care Management Coordinator Direct Line [phone]

## 2021-12-19 NOTE — Patient Instructions (Signed)
Visit Information  Thank you for taking time to visit with me today. Please don't hesitate to contact me if I can be of assistance to you.   Following are the goals we discussed today:   Goals Addressed             This Visit's Progress    COMPLETED: Care Coordination Activities-No follow up required       Care Coordination Interventions: Advised patient to schedule annual exam.  Discussed [facility] services and support. Patient decline           If you are experiencing a Mental Health or [place] or need someone to talk to, please call the Suicide and Crisis Lifeline: 988   Patient verbalizes understanding of instructions and care plan provided today and agrees to view in [place]. Active MyChart status and patient understanding of how to access instructions and care plan via MyChart confirmed with patient.     No further follow up required:   Jone Baseman, RN, MSN [place] [facility] Care Management Coordinator Direct Line [phone]

## 2021-12-23 DIAGNOSIS — M9901 Segmental and somatic dysfunction of cervical region: Secondary | ICD-10-CM | POA: Diagnosis not present

## 2022-01-09 ENCOUNTER — Encounter: Payer: BC Managed Care – PPO | Admitting: Internal Medicine

## 2022-01-13 ENCOUNTER — Encounter: Payer: Self-pay | Admitting: Internal Medicine

## 2022-01-13 NOTE — Patient Instructions (Signed)
Due to recent changes in healthcare laws, you may see the results of your imaging and laboratory studies on MyChart before your provider has had a chance to review them.  We understand that in some cases there may be results that are confusing or concerning to you. Not all laboratory results come back in the same time frame and the provider may be waiting for multiple results in order to interpret others.  Please give [place] 48 hours in order for your provider to thoroughly review all the results before contacting the office for clarification of your results.   ++++++++++++++++++++++++++++++  Vit D  & Vit C 1,000 mg   are recommended to help protect  against the Covid-19 and other Corona viruses.    Also it's recommended  to take  Zinc 50 mg  to help  protect against the Covid-19   and best place to get  is also on [email or web address]  and don't pay more than 6-8 cents /pill !  ================================ Coronavirus (COVID-19) Are you at risk?  Are you at risk for the Coronavirus (COVID-19)?  To be considered HIGH RISK for Coronavirus (COVID-19), you have to meet the following criteria:  Traveled to [place], [place], [place], [place] or [place]; or in the [place] to [place], [place], [place]  or [place]; and have fever, cough, and shortness of breath within the last 2 weeks of travel OR Been in close contact with a person diagnosed with COVID-19 within the last 2 weeks and have  fever, cough,and shortness of breath  IF YOU DO NOT MEET THESE CRITERIA, YOU ARE CONSIDERED LOW RISK FOR COVID-19.  What to do if you are HIGH RISK for COVID-19?  If you are having a medical emergency, call 911. Seek medical care right away. Before you go to a doctor's office, urgent care or emergency department,  call ahead and tell them about your recent travel, contact with someone diagnosed with COVID-19   and your symptoms.  You should receive instructions from your physician's office regarding  next steps of care.  When you arrive at healthcare provider, tell the healthcare staff immediately you have returned from  visiting [place], [place], [place], [place] or [place]; or traveled in the [place] to [place], [place],  [place] or [place] in the last two weeks or you have been in close contact with a person diagnosed with  COVID-19 in the last 2 weeks.   Tell the health care staff about your symptoms: fever, cough and shortness of breath. After you have been seen by a medical provider, you will be either: Tested for (COVID-19) and discharged home on quarantine except to seek medical care if  symptoms worsen, and asked to  Stay home and avoid contact with others until you get your results (4-5 days)  Avoid travel on public transportation if possible (such as bus, train, or airplane) or Sent to the Emergency Department by EMS for evaluation, COVID-19 testing  and  possible admission depending on your condition and test results.  What to do if you are LOW RISK for COVID-19?  Reduce your risk of any infection by using the same precautions used for avoiding the common cold or flu:  Wash your hands often with soap and warm water for at least 20 seconds.  If soap and water are not readily available,  use an alcohol-based hand sanitizer with at least 60% alcohol.  If coughing or sneezing, cover your mouth and nose by coughing  or sneezing into the elbow areas of your shirt or coat,  into a tissue or into your sleeve (not your hands). Avoid shaking hands with others and consider head nods or verbal greetings only. Avoid touching your eyes, nose, or mouth with unwashed hands.  Avoid close contact with people who are sick. Avoid places or events with large numbers of people in one location, like concerts or sporting events. Carefully consider travel plans you have or are making. If you are planning any travel outside or inside the [place], visit the CDC's Travelers' Health webpage for  the latest health notices. If you have some symptoms but not all symptoms, continue to monitor at home and seek medical attention  if your symptoms worsen. If you are having a medical emergency, call 911. >>>>>>>>>>>>>>>>>>>>>>> Preventive Care for Adults  A healthy lifestyle and preventive care can promote health and wellness. Preventive health guidelines for women include the following key practices. A routine yearly physical is a good way to check with your health care provider about your health and preventive screening. It is a chance to share any concerns and updates on your health and to receive a thorough exam. Visit your dentist for a routine exam and preventive care every 6 months. Brush your teeth twice a day and floss once a day. Good oral hygiene prevents tooth decay and gum disease. The frequency of eye exams is based on your age, health, family medical history, use of contact lenses, and other factors. Follow your health care provider's recommendations for frequency of eye exams. Eat a healthy diet. Foods like vegetables, fruits, whole grains, low-fat dairy products, and lean protein foods contain the nutrients you need without too many calories. Decrease your intake of foods high in solid fats, added sugars, and salt. Eat the right amount of calories for you. Get information about a proper diet from your health care provider, if necessary. Regular physical exercise is one of the most important things you can do for your health. Most adults should get at least 150 minutes of moderate-intensity exercise (any activity that increases your heart rate and causes you to sweat) each week. In addition, most adults need muscle-strengthening exercises on 2 or more days a week. Maintain a healthy weight. The body mass index (BMI) is a screening tool to identify possible weight problems. It provides an estimate of body fat based on height and weight. Your health care provider can find your BMI and can  help you achieve or maintain a healthy weight. For adults 20 years and older: A BMI below 18.5 is considered underweight. A BMI of 18.5 to 24.9 is normal. A BMI of 25 to 29.9 is considered overweight. A BMI of 30 and above is considered obese. Maintain normal blood lipids and cholesterol levels by exercising and minimizing your intake of saturated fat. Eat a balanced diet with plenty of fruit and vegetables. Blood tests for lipids and cholesterol should begin at age 43 and be repeated every 5 years. If your lipid or cholesterol levels are high, you are over 50, or you are at high risk for heart disease, you may need your cholesterol levels checked more frequently. Ongoing high lipid and cholesterol levels should be treated with medicines if diet and exercise are not working. If you smoke, find out from your health care provider how to quit. If you do not use tobacco, do not start. Lung cancer screening is recommended for adults aged 11-80 years who are at high risk for developing  lung cancer because of a history of smoking. A yearly low-dose CT scan of the lungs is recommended for people who have at least a 30-pack-year history of smoking and are a current smoker or have quit within the past 15 years. A pack year of smoking is smoking an average of 1 pack of cigarettes a day for 1 year (for example: 1 pack a day for 30 years or 2 packs a day for 15 years). Yearly screening should continue until the smoker has stopped smoking for at least 15 years. Yearly screening should be stopped for people who develop a health problem that would prevent them from having lung cancer treatment. High blood pressure causes heart disease and increases the risk of stroke. Your blood pressure should be checked at least every 1 to 2 years. Ongoing high blood pressure should be treated with medicines if weight loss and exercise do not work. If you are 32-3 years old, ask your health care provider if you should take aspirin to  prevent strokes. Diabetes screening involves taking a blood sample to check your fasting blood sugar level. This should be done once every 3 years, after age 32, if you are within normal weight and without risk factors for diabetes. Testing should be considered at a younger age or be carried out more frequently if you are overweight and have at least 1 risk factor for diabetes. Breast cancer screening is essential preventive care for women. You should practice "breast self-awareness." This means understanding the normal appearance and feel of your breasts and may include breast self-examination. Any changes detected, no matter how small, should be reported to a health care provider. Women in their 45s and 30s should have a clinical breast exam (CBE) by a health care provider as part of a regular health exam every 1 to 3 years. After age 83, women should have a CBE every year. Starting at age 92, women should consider having a mammogram (breast X-ray test) every year. Women who have a family history of breast cancer should talk to their health care provider about genetic screening. Women at a high risk of breast cancer should talk to their health care providers about having an MRI and a mammogram every year. Breast cancer gene (BRCA)-related cancer risk assessment is recommended for women who have family members with BRCA-related cancers. BRCA-related cancers include breast, ovarian, tubal, and peritoneal cancers. Having family members with these cancers may be associated with an increased risk for harmful changes (mutations) in the breast cancer genes BRCA1 and BRCA2. Results of the assessment will determine the need for genetic counseling and BRCA1 and BRCA2 testing. Routine pelvic exams to screen for cancer are no longer recommended for nonpregnant women who are considered low risk for cancer of the pelvic organs (ovaries, uterus, and vagina) and who do not have symptoms. Ask your health care provider if a  screening pelvic exam is right for you. If you have had past treatment for cervical cancer or a condition that could lead to cancer, you need Pap tests and screening for cancer for at least 20 years after your treatment. If Pap tests have been discontinued, your risk factors (such as having a new sexual partner) need to be reassessed to determine if screening should be resumed. Some women have medical problems that increase the chance of getting cervical cancer. In these cases, your health care provider may recommend more frequent screening and Pap tests. Colorectal cancer can be detected and often prevented. Most routine colorectal  cancer screening begins at the age of 10 years and continues through age 19 years. However, your health care provider may recommend screening at an earlier age if you have risk factors for colon cancer. On a yearly basis, your health care provider may provide home test kits to check for hidden blood in the stool. Use of a small camera at the end of a tube, to directly examine the colon (sigmoidoscopy or colonoscopy), can detect the earliest forms of colorectal cancer. Talk to your health care provider about this at age 28, when routine screening begins.  Direct exam of the colon should be repeated every 5-10 years through age 71 years, unless early forms of pre-cancerous polyps or small growths are found. Hepatitis C blood testing is recommended for all people born from [birth year] through 1965 and any individual with known risks for hepatitis C.  Osteoporosis is a disease in which the bones lose minerals and strength with aging. This can result in serious bone fractures or breaks. The risk of osteoporosis can be identified using a bone density scan. Women ages 21 years and over and women at risk for fractures or osteoporosis should discuss screening with their health care providers. Ask your health care provider whether you should take a calcium supplement or vitamin D to reduce the rate  of osteoporosis. Menopause can be associated with physical symptoms and risks. Hormone replacement therapy is available to decrease symptoms and risks. You should talk to your health care provider about whether hormone replacement therapy is right for you. Use sunscreen. Apply sunscreen liberally and repeatedly throughout the day. You should seek shade when your shadow is shorter than you. Protect yourself by wearing long sleeves, pants, a wide-brimmed hat, and sunglasses year round, whenever you are outdoors. Once a month, do a whole body skin exam, using a mirror to look at the skin on your back. Tell your health care provider of new moles, moles that have irregular borders, moles that are larger than a pencil eraser, or moles that have changed in shape or color. Stay current with required vaccines (immunizations). Influenza vaccine. All adults should be immunized every year. Tetanus, diphtheria, and acellular pertussis (Td, Tdap) vaccine. Pregnant women should receive 1 dose of Tdap vaccine during each pregnancy. The dose should be obtained regardless of the length of time since the last dose. Immunization is preferred during the 27th-36th week of gestation. An adult who has not previously received Tdap or who does not know her vaccine status should receive 1 dose of Tdap. This initial dose should be followed by tetanus and diphtheria toxoids (Td) booster doses every 10 years. Adults with an unknown or incomplete history of completing a 3-dose immunization series with Td-containing vaccines should begin or complete a primary immunization series including a Tdap dose. Adults should receive a Td booster every 10 years. Varicella vaccine. An adult without evidence of immunity to varicella should receive 2 doses or a second dose if she has previously received 1 dose. Pregnant females who do not have evidence of immunity should receive the first dose after pregnancy. This first dose should be obtained before  leaving the health care facility. The second dose should be obtained 4-8 weeks after the first dose. Human papillomavirus (HPV) vaccine. Females aged 13-26 years who have not received the vaccine previously should obtain the 3-dose series. The vaccine is not recommended for use in pregnant females. However, pregnancy testing is not needed before receiving a dose. If a female is found to  be pregnant after receiving a dose, no treatment is needed. In that case, the remaining doses should be delayed until after the pregnancy. Immunization is recommended for any person with an immunocompromised condition through the age of 55 years if she did not get any or all doses earlier. During the 3-dose series, the second dose should be obtained 4-8 weeks after the first dose. The third dose should be obtained 24 weeks after the first dose and 16 weeks after the second dose. Zoster vaccine. One dose is recommended for adults aged 15 years or older unless certain conditions are present. Measles, mumps, and rubella (MMR) vaccine. Adults born before 20 generally are considered immune to measles and mumps. Adults born in [birth year] or later should have 1 or more doses of MMR vaccine unless there is a contraindication to the vaccine or there is laboratory evidence of immunity to each of the three diseases. A routine second dose of MMR vaccine should be obtained at least 28 days after the first dose for students attending postsecondary schools, health care workers, or international travelers. People who received inactivated measles vaccine or an unknown type of measles vaccine during 1963-1967 should receive 2 doses of MMR vaccine. People who received inactivated mumps vaccine or an unknown type of mumps vaccine before 1979 and are at high risk for mumps infection should consider immunization with 2 doses of MMR vaccine. For females of childbearing age, rubella immunity should be determined. If there is no evidence of immunity, females  who are not pregnant should be vaccinated. If there is no evidence of immunity, females who are pregnant should delay immunization until after pregnancy. Unvaccinated health care workers born before [birth year] who lack laboratory evidence of measles, mumps, or rubella immunity or laboratory confirmation of disease should consider measles and mumps immunization with 2 doses of MMR vaccine or rubella immunization with 1 dose of MMR vaccine. Pneumococcal 13-valent conjugate (PCV13) vaccine. When indicated, a person who is uncertain of her immunization history and has no record of immunization should receive the PCV13 vaccine. An adult aged 4 years or older who has certain medical conditions and has not been previously immunized should receive 1 dose of PCV13 vaccine. This PCV13 should be followed with a dose of pneumococcal polysaccharide (PPSV23) vaccine. The PPSV23 vaccine dose should be obtained at least 1 or more year(s) after the dose of PCV13 vaccine. An adult aged 15 years or older who has certain medical conditions and previously received 1 or more doses of PPSV23 vaccine should receive 1 dose of PCV13. The PCV13 vaccine dose should be obtained 1 or more years after the last PPSV23 vaccine dose.  Pneumococcal polysaccharide (PPSV23) vaccine. When PCV13 is also indicated, PCV13 should be obtained first. All adults aged 9 years and older should be immunized. An adult younger than age 14 years who has certain medical conditions should be immunized. Any person who resides in a nursing home or long-term care facility should be immunized. An adult smoker should be immunized. People with an immunocompromised condition and certain other conditions should receive both PCV13 and PPSV23 vaccines. People with human immunodeficiency virus (HIV) infection should be immunized as soon as possible after diagnosis. Immunization during chemotherapy or radiation therapy should be avoided. Routine use of PPSV23 vaccine is not  recommended for American Indians, [place] Natives, or people younger than 65 years unless there are medical conditions that require PPSV23 vaccine. When indicated, people who have unknown immunization and have no record of immunization should receive  PPSV23 vaccine. One-time revaccination 5 years after the first dose of PPSV23 is recommended for people aged 19-64 years who have chronic kidney failure, nephrotic syndrome, asplenia, or immunocompromised conditions. People who received 1-2 doses of PPSV23 before age 36 years should receive another dose of PPSV23 vaccine at age 52 years or later if at least 5 years have passed since the previous dose. Doses of PPSV23 are not needed for people immunized with PPSV23 at or after age 34 years.  Preventive Services / Frequency  Ages 74 to 1 years Blood pressure check. Lipid and cholesterol check. Lung cancer screening. / Every year if you are aged 53-80 years and have a 30-pack-year history of smoking and currently smoke or have quit within the past 15 years. Yearly screening is stopped once you have quit smoking for at least 15 years or develop a health problem that would prevent you from having lung cancer treatment. Clinical breast exam.** / Every year after age 16 years.  BRCA-related cancer risk assessment.** / For women who have family members with a BRCA-related cancer (breast, ovarian, tubal, or peritoneal cancers). Mammogram.** / Every year beginning at age 26 years and continuing for as long as you are in good health. Consult with your health care provider. Pap test.** / Every 3 years starting at age 19 years through age 52 or 29 years with a history of 3 consecutive normal Pap tests. HPV screening.** / Every 3 years from ages 18 years through ages 4 to 110 years with a history of 3 consecutive normal Pap tests. Fecal occult blood test (FOBT) of stool. / Every year beginning at age 70 years and continuing until age 53 years. You may not need to do this  test if you get a colonoscopy every 10 years. Flexible sigmoidoscopy or colonoscopy.** / Every 5 years for a flexible sigmoidoscopy or every 10 years for a colonoscopy beginning at age 66 years and continuing until age 44 years. Hepatitis C blood test.** / For all people born from [birth year] through 1965 and any individual with known risks for hepatitis C. Skin self-exam. / Monthly. Influenza vaccine. / Every year. Tetanus, diphtheria, and acellular pertussis (Tdap/Td) vaccine.** / Consult your health care provider. Pregnant women should receive 1 dose of Tdap vaccine during each pregnancy. 1 dose of Td every 10 years. Varicella vaccine.** / Consult your health care provider. Pregnant females who do not have evidence of immunity should receive the first dose after pregnancy. Zoster vaccine.** / 1 dose for adults aged 90 years or older. Pneumococcal 13-valent conjugate (PCV13) vaccine.** / Consult your health care provider. Pneumococcal polysaccharide (PPSV23) vaccine.** / 1 to 2 doses if you smoke cigarettes or if you have certain conditions. Meningococcal vaccine.** / Consult your health care provider. Hepatitis A vaccine.** / Consult your health care provider. Hepatitis B vaccine.** / Consult your health care provider. Screening for abdominal aortic aneurysm (AAA)  by ultrasound is recommended for people over 50 who have history of high blood pressure or who are current or former smokers. ++++++++++++++++++ Recommend Adult Low Dose Aspirin or  coated  Aspirin 81 mg daily  To reduce risk of Colon Cancer 40 %,  Skin Cancer 26 % ,  Melanoma 46%  and  Pancreatic cancer 60% +++++++++++++++++++ Vitamin D goal  is between 70-100.  Please make sure that you are taking your Vitamin D as directed.  It is very important as a natural anti-inflammatory  helping hair, skin, and nails, as well as reducing stroke and heart  attack risk.  It helps your bones and helps with mood. It also decreases numerous  cancer risks so please take it as directed.  Low Vit D is associated with a 200-300% higher risk for CANCER  and 200-300% higher risk for HEART   ATTACK  &  STROKE.   .....................................Marland Kitchen It is also associated with higher death rate at younger ages,  autoimmune diseases like Rheumatoid arthritis, Lupus, Multiple Sclerosis.    Also many other serious conditions, like depression, Alzheimer's Dementia, infertility, muscle aches, fatigue, fibromyalgia - just to name a few. ++++++++++++++++++ Recommend the book "The END of DIETING" by Dr Excell Seltzer  & the book "The END of DIABETES " by Dr Excell Seltzer At [email or web address] - get book & Audio CD's    Being diabetic has a  300% increased risk for heart attack, stroke, cancer, and alzheimer- type vascular dementia. It is very important that you work harder with diet by avoiding all foods that are white. Avoid white rice (brown & wild rice is OK), white potatoes (sweetpotatoes in moderation is OK), White bread or wheat bread or anything made out of white flour like bagels, donuts, rolls, buns, biscuits, cakes, pastries, cookies, pizza crust, and pasta (made from white flour & egg whites) - vegetarian pasta or spinach or wheat pasta is OK. Multigrain breads like Arnold's or [employer], or multigrain sandwich thins or flatbreads.  Diet, exercise and weight loss can reverse and cure diabetes in the early stages.  Diet, exercise and weight loss is very important in the control and prevention of complications of diabetes which affects every system in your body, ie. Brain - dementia/stroke, eyes - glaucoma/blindness, heart - heart attack/heart failure, kidneys - dialysis, stomach - gastric paralysis, intestines - malabsorption, nerves - severe painful neuritis, circulation - gangrene & loss of a leg(s), and finally cancer and Alzheimers.    I recommend avoid fried & greasy foods,  sweets/candy, white rice (brown or wild rice or Quinoa is OK), white  potatoes (sweet potatoes are OK) - anything made from white flour - bagels, doughnuts, rolls, buns, biscuits,white and wheat breads, pizza crust and traditional pasta made of white flour & egg white(vegetarian pasta or spinach or wheat pasta is OK).  Multi-grain bread is OK - like multi-grain flat bread or sandwich thins. Avoid alcohol in excess. Exercise is also important.    Eat all the vegetables you want - avoid meat, especially red meat and dairy - especially cheese.  Cheese is the most concentrated form of trans-fats which is the worst thing to clog up our arteries. Veggie cheese is OK which can be found in the fresh produce section at [employer] or [employer] or [employer]  ++++++++++++++++++++++ DASH Eating Plan  DASH stands for "Dietary Approaches to Stop Hypertension."   The DASH eating plan is a healthy eating plan that has been shown to reduce high blood pressure (hypertension). Additional health benefits may include reducing the risk of type 2 diabetes mellitus, heart disease, and stroke. The DASH eating plan may also help with weight loss. WHAT DO I NEED TO KNOW ABOUT THE DASH EATING PLAN? For the DASH eating plan, you will follow these general guidelines: Choose foods with a percent daily value for sodium of less than 5% (as listed on the food label). Use salt-free seasonings or herbs instead of table salt or sea salt. Check with your health care provider or pharmacist before using salt substitutes. Eat lower-sodium products, often labeled as "lower sodium" or "no  salt added." Eat fresh foods. Eat more vegetables, fruits, and low-fat dairy products. Choose whole grains. Look for the word "whole" as the first word in the ingredient list. Choose fish  Limit sweets, desserts, sugars, and sugary drinks. Choose heart-healthy fats. Eat veggie cheese  Eat more home-cooked food and less restaurant, buffet, and fast food. Limit fried foods. Cook foods using methods other than  frying. Limit canned vegetables. If you do use them, rinse them well to decrease the sodium. When eating at a restaurant, ask that your food be prepared with less salt, or no salt if possible.                      WHAT FOODS CAN I EAT? Read Dr Fara Olden Fuhrman's books on The End of Dieting & The End of Diabetes  Grains Whole grain or whole wheat bread. Brown rice. Whole grain or whole wheat pasta. Quinoa, bulgur, and whole grain cereals. Low-sodium cereals. Corn or whole wheat flour tortillas. Whole grain cornbread. Whole grain crackers. Low-sodium crackers.  Vegetables Fresh or frozen vegetables (raw, steamed, roasted, or grilled). Low-sodium or reduced-sodium tomato and vegetable juices. Low-sodium or reduced-sodium tomato sauce and paste. Low-sodium or reduced-sodium canned vegetables.   Fruits All fresh, canned (in natural juice), or frozen fruits.  Protein Products  All fish and seafood.  Dried beans, peas, or lentils. Unsalted nuts and seeds. Unsalted canned beans.  Dairy Low-fat dairy products, such as skim or 1% milk, 2% or reduced-fat cheeses, low-fat ricotta or cottage cheese, or plain low-fat yogurt. Low-sodium or reduced-sodium cheeses.  Fats and Oils Tub margarines without trans fats. Light or reduced-fat mayonnaise and salad dressings (reduced sodium). Avocado. Safflower, olive, or canola oils. Natural peanut or almond butter.  Other Unsalted popcorn and pretzels. The items listed above may not be a complete list of recommended foods or beverages. Contact your dietitian for more options.  ++++++++++++++++++  WHAT FOODS ARE NOT RECOMMENDED? Grains/ White flour or wheat flour White bread. White pasta. White rice. Refined cornbread. Bagels and croissants. Crackers that contain trans fat.  Vegetables  Creamed or fried vegetables. Vegetables in a . Regular canned vegetables. Regular canned tomato sauce and paste. Regular tomato and vegetable juices.  Fruits Dried fruits.  Canned fruit in light or heavy syrup. Fruit juice.  Meat and Other Protein Products Meat in general - RED meat & White meat.  Fatty cuts of meat. Ribs, chicken wings, all processed meats as bacon, sausage, bologna, salami, fatback, hot dogs, bratwurst and packaged luncheon meats.  Dairy Whole or 2% milk, cream, half-and-half, and cream cheese. Whole-fat or sweetened yogurt. Full-fat cheeses or blue cheese. Non-dairy creamers and whipped toppings. Processed cheese, cheese spreads, or cheese curds.  Condiments Onion and garlic salt, seasoned salt, table salt, and sea salt. Canned and packaged gravies. Worcestershire sauce. Tartar sauce. Barbecue sauce. Teriyaki sauce. Soy sauce, including reduced sodium. Steak sauce. Fish sauce. Oyster sauce. Cocktail sauce. Horseradish. Ketchup and mustard. Meat flavorings and tenderizers. Bouillon cubes. Hot sauce. Tabasco sauce. Marinades. Taco seasonings. Relishes.  Fats and Oils Butter, stick margarine, lard, shortening and bacon fat. Coconut, palm kernel, or palm oils. Regular salad dressings.  Pickles and olives. Salted popcorn and pretzels.  The items listed above may not be a complete list of foods and beverages to avoid.

## 2022-01-13 NOTE — Progress Notes (Unsigned)
Annual Screening/Preventative Visit & Comprehensive Evaluation &  Examination   Future Appointments  Date Time Provider Department  01/14/2022                          cpe 11:00 AM Unk Pinto, MD [facility]  02/09/2022  9:20 AM Armbruster, Carlota Raspberry, MD [facility]  01/15/2023                          cpe 11:00 AM Unk Pinto, MD [facility]          This very nice 62 y.o. MWF  presents for a Screening /Preventative Visit & comprehensive evaluation and management of multiple medical co-morbidities.  Patient has been followed for HTN, HLD, Prediabetes  and Vitamin D Deficiency. She also has hx/o mild OSA & was recommended an orsl device. Patient has GERD & is usually controlled with diet & her meds.       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 She reports sx's of cramping & diarrhea have resolved.                            HTN predates since  2014 .  In 2016, she was suspected to have a TIA vs Migraine event . In 2017, Cardiac MRI was Normal per Dr Cathie Olden. Stress Myoview in 2018 was Negative .  Patient's BP has been controlled at home . CAC testing in April 2022 was essentially Negative. Patient denies any cardiac symptoms as chest pain, palpitations, shortness of breath, dizziness or ankle swelling. Today's BP is at goal -   110/70  .          Patient's hyperlipidemia is controlled with diet and gLipitor.  Patient denies myalgias or other medication SE's. Last lipids were at goal with sl elevated Trig's :  Lab Results  Component Value Date   CHOL 169 07/21/2021   HDL 36 (L) 07/21/2021   LDLCALC 95 07/21/2021   TRIG 265 (H) 07/21/2021   CHOLHDL 4.7 07/21/2021         Patient has hx/o prediabetes predating (A1c 6.0%  /2012) and patient denies reactive hypoglycemic symptoms, visual blurring, diabetic polys or paresthesias. Last A1c was near  goal :  Lab Results  Component Value Date    HGBA1C 5.7 (H) 07/21/2021         Finally, patient has history of Vitamin D Deficiency ("48" on tx /2008)  and last Vitamin D was at goal :  Lab Results  Component Value Date   VD25OH 71 01/02/2021      Current Outpatient Medications:     acyclovir ointment (ZOVIRAX) 5 %, Apply 1 application topically every 3 (three) hours., Disp: 16 g, Rfl: 1   Adapalene-Benzoyl Peroxide (EPIDUO) 0.1-2.5 % gel, Apply 1 application topically as needed (acne)., Disp: 45 g, Rfl: 0   albuterol (VENTOLIN HFA) 108 (90 Base) MCG/ACT inhaler, INHALE 2 PUFFS INTO LUNGS EVERY 4 HOURS AS NEEDED FOR WHEEZING OR SHORTNESS OF BREATH, Disp: 6.7 each, Rfl: 1   amLODipine (NORVASC) 5 MG tablet, TAKE 1 TABLET  EVERY DAY FOR BLOOD PRESSURE, Disp: 90 tablet, Rfl: 3   aspirin EC 81 MG tablet, Take 1 tablet (81 mg total) by mouth daily    budesonide (ENTOCORT EC)  3 MG 24 hr capsule, Take 3 capsules (9 mg total) by mouth daily for 42 days, THEN 2 capsules (6 mg total) daily for 14 days, THEN 1 capsule (3 mg total) daily for 14 days., Disp: 168 capsule    VITAMIN D 5,000 Units ,  Take daily. , Disp: , Rfl:    diphenoxylate-atropine (LOMOTIL) 2.5-0.025 MG tablet, Take 1 tablet  (four) times daily as needed for diarrhea or loose stools., Disp: 30 tablet, Rfl: 1   escitalopram (LEXAPRO) 10 MG tablet, TAKE 1 TABLET DAILY FOR MOOD & ANXIETY,   esomeprazole (NEXIUM) 40 MG capsule, TAKE 1 CAPSULE DAILY    estradiol (ESTRACE) 0.5 MG tablet, Take 0.5 mg  daily., Disp: , Rfl:    fluticasone (FLONASE) 50 MCG/ACT nasal spray, SPRAY 2 SPRAYS INTO EACH NOSTRIL EVERY DAY, Disp: 48 mL, Rfl: 1   furosemide (LASIX) 40 MG tablet, TAKE 1 TABLET EVERY DAY FOR BLOOD PRESSURE AND FLUID, Disp: 90 tablet, Rfl: 3   hyoscyamine (LEVSIN SL) 0.125 MG SL tablet, Place 1 tablet (0.125 mg total) under the tongue every 6 (six) hours as needed., Disp: 30 tablet, Rfl: 1   medroxyPROGESTERone (PROVERA) 2.5 MG tablet, Take  daily., Disp: , Rfl:    metoprolol  succinate (TOPROL-XL) 25 MG 24 hr tablet, TAKE 1/2 TABLET EVERY DAY, Disp: 45 tablet, Rfl: 3   montelukast (SINGULAIR) 10 MG tablet, TAKE 1 TABLET  DAILY FOR ALLERGIES, Disp: 90 tablet, Rfl: 3   olmesartan (BENICAR) 20 MG tablet, TAKE 1 TABLET  DAILY., Disp: 90 tablet, Rfl: 1   Omega-3 Fatty Acids (FISH OIL PO), Take 1 capsule  daily., Disp: , Rfl:    potassium chloride (KLOR-CON) 10 MEQ tablet, Take 1 tablet  2 (two) times daily., Disp: 180 tablet, Rfl: 3   rosuvastatin (CRESTOR) 10 MG tablet, Take 1 tablet (10 mg total) by mouth daily., Disp: 90 tablet, Rfl: 3   topiramate (TOPAMAX) 50 MG tablet, Take 1 tablet  2 x /day  at Suppertime & Bedtime  for Dieting & Weight Loss, Disp: 180 tablet, Rfl: 3   valACYclovir (VALTREX) 500 MG tablet, Take  1 tablet  2 x /day as needed for Viral Suppression,   Allergies  Allergen Reactions   Metoprolol     Not allergy, but intolerance - had bradycardia with HR in the 40s even with low dose   Oxycodone Itching    Itching: Trunk, palms of hands, legs, back       Past Medical History:  Diagnosis Date   Allergy    Anxiety    Asthma    Cardiomyopathy ([place])    a. Echo 8/16:  EF 40-45%, apical and ant-septal HK, Gr 2 DD   Diverticula, colon    Family history of adverse reaction to anesthesia    sisiter had PONV   Headache    rare migraines   History of cardiovascular stress test    Myoview 8/16:  EF 48%, anterior and apical defect c/w breast atten, no ischemia; Low Risk // Nuclear stress test 9/18: Low risk stress nuclear study with fixed septal defect consistent with LBBB vs prior infarct; no ischemia; EF 53 with septal akinesis.   Hyperlipidemia    Hypertension    Irregular periods/menstrual cycles    LBBB (left bundle branch block)    Melanoma (HCC)    right arm   Seasonal allergies    TIA (transient ischemic attack)    Torn meniscus      Health Maintenance  Topic Date Due   Zoster Vaccines- Shingrix (1 of 2) Never done   Pneumococcal  Vaccine 36-69 Years old (2 - PCV) 02/11/2005   PAP SMEAR-Modifier  05/19/2019   COVID-19 Vaccine (3 - Pfizer risk series) 10/03/2019   MAMMOGRAM  07/03/2021   TETANUS/TDAP  01/08/2025   COLONOSCOPY (Pts 45-39yr Insurance coverage will need to be confirmed)  07/13/2027   INFLUENZA VACCINE  Completed   Hepatitis C Screening  Completed   HIV Screening  Completed   HPV VACCINES  Aged Out     Immunization History  Administered Date(s) Administered   Influenza Inj Mdck Quad  10/06/2018, 10/06/2018   Influenza Inj Mdck Quad  10/13/2016, 11/05/2017, 12/20/2019   Influenza Split 10/24/2013   Influenza,inj,Quad  12/09/2020   Influenza,inj,quad 12/24/2015   Influenza 09/12/2013, 10/13/2015   PFIZER SARS-COV-2 Vacc 08/08/2019, 09/05/2019   PPD Test 10/13/2016, 11/05/2017, 12/20/2019   Pneumococcal - 23 02/12/2004   Td 10/12/2004   Tdap 01/09/2015    Last Colon - 11/28/2021 - Dr AHavery Moros- Dx - Lymphocytic Colitis - Recc   Last MGM - 03/04/2021 -    Past Surgical History:  Procedure Laterality Date   ABLATION     BREAST REDUCTION SURGERY     CARDIAC CATHETERIZATION     CESAREAN SECTION     x2   KNEE SURGERY     partial knee replacement right   MELANOMA EXCISION WITH SENTINEL LYMPH NODE BIOPSY Right 09/15/2017   Procedure: BIOPSY RIGHT SHOULDER MOLE AND WIDE LOCAL EXCISION OF RIGHT ARM MELANOMA, ADVANCEMENT FLAP CLOSURE, SENTINEL LYMPH NODE BIOPSY  RIGHT ARM MELANOMA;  Surgeon: BStark Klein MD;  [place]  Service: General;  Laterality: Right;   REDUCTION MAMMAPLASTY Bilateral    REPAIR QUADRICEPS / HAMSTRING MUSCLE     REPLACEMENT TOTAL KNEE Left 09/20/2018   [place]    tubes in ears  10/12     Family History  Problem Relation Age of Onset   Diabetes Mother    Heart disease Mother    Hypertension Mother    Heart attack Mother [id number]  Other Father        MVA   Asthma Sister    Asthma Son    Cancer Maternal  Grandmother        Uterine   Breast cancer Maternal Grandmother    Heart attack Paternal Grandmother    Colon cancer Neg Hx    Colon polyps Neg Hx    Esophageal cancer Neg Hx    Stomach cancer Neg Hx    Rectal cancer Neg Hx      Social History   Tobacco Use   Smoking status: Never   Smokeless tobacco: Never  Vaping Use   Vaping Use: Never used  Substance Use Topics   Alcohol use: Yes    Comment: 1-2 glasses wine nightly   Drug use: No      ROS Constitutional: Denies fever, chills, weight loss/gain, headaches, insomnia,  night sweats, and change in appetite. Does c/o fatigue. Eyes: Denies redness, blurred vision, diplopia, discharge, itchy, watery eyes.  ENT: Denies discharge, congestion, post nasal drip, epistaxis, sore throat, earache, hearing loss, dental pain, Tinnitus, Vertigo, Sinus pain, snoring.  Cardio: Denies chest pain, palpitations, irregular heartbeat, syncope, dyspnea, diaphoresis, orthopnea, PND, claudication, edema Respiratory: denies cough, dyspnea, DOE, pleurisy, hoarseness, laryngitis, wheezing.  Gastrointestinal: Denies dysphagia, heartburn, reflux, water brash, pain, cramps, nausea, vomiting, bloating, diarrhea, constipation, hematemesis, melena,  hematochezia, jaundice, hemorrhoids Genitourinary: Denies dysuria, frequency, urgency, nocturia, hesitancy, discharge, hematuria, flank pain Breast: Breast lumps, nipple discharge, bleeding.  Musculoskeletal: Denies arthralgia, myalgia, stiffness, Jt. Swelling, pain, limp, and strain/sprain. Denies falls. Skin: Denies puritis, rash, hives, warts, acne, eczema, changing in skin lesion Neuro: No weakness, tremor, incoordination, spasms, paresthesia, pain Psychiatric: Denies confusion, memory loss, sensory loss. Denies Depression. Endocrine: Denies change in weight, skin, hair change, nocturia, and paresthesia, diabetic polys, visual blurring, hyper / hypo glycemic episodes.  Heme/Lymph: No excessive bleeding,  bruising, enlarged lymph nodes.  Physical Exam  BP 110/70   Pulse 82   Temp 97.9 F (36.6 C)   Resp 17   Ht 5' (1.524 m)   Wt 133 lb 12.8 oz (60.7 kg)   SpO2 98%   BMI 26.13 kg/m   General Appearance: Well nourished, well groomed and in no apparent distress.  Eyes: PERRLA, EOMs, conjunctiva no swelling or erythema, normal fundi and vessels. Sinuses: No frontal/maxillary tenderness ENT/Mouth: EACs patent / TMs  nl. Nares clear without erythema, swelling, mucoid exudates. Oral hygiene is good. No erythema, swelling, or exudate. Tongue normal, non-obstructing. Tonsils not swollen or erythematous. Hearing normal.  Neck: Supple, thyroid not palpable. No bruits, nodes or JVD. Respiratory: Respiratory effort normal.  BS equal and clear bilateral without rales, rhonci, wheezing or stridor. Cardio: Heart sounds are normal with regular rate and rhythm and no murmurs, rubs or gallops. Peripheral pulses are normal and equal bilaterally without edema. No aortic or femoral bruits. Chest: symmetric with normal excursions and percussion. Breasts:  Deferred to recent MGM. Abdomen: Flat, soft with bowel sounds active. Nontender, no guarding, rebound, hernias, masses, or organomegaly.  Lymphatics: Non tender without lymphadenopathy.  Musculoskeletal: Full ROM all peripheral extremities, joint stability, 5/5 strength, and normal gait. Skin: Warm and dry without rashes, lesions, cyanosis, clubbing or  ecchymosis.  Neuro: Cranial nerves intact, reflexes equal bilaterally. Normal muscle tone, no cerebellar symptoms. Sensation intact.  Pysch: Alert and oriented X 3, normal affect, Insight and Judgment appropriate.    Assessment and Plan  1. Annual Preventative Screening Examination   2. Essential hypertension  - EKG 12-Lead - [place], RETROPERITNL ABD,  LTD - Urinalysis, Routine w reflex microscopic - Microalbumin / creatinine urine ratio - CBC with Differential/Platelet - COMPLETE METABOLIC PANEL  WITH GFR - Magnesium - TSH  3. Hyperlipidemia, mixed  - EKG 12-Lead - [place], RETROPERITNL ABD,  LTD - Lipid panel - TSH  4. Abnormal glucose  - EKG 12-Lead - [place], RETROPERITNL ABD,  LTD - Hemoglobin A1c - Insulin, random  5. Vitamin D deficiency  - VITAMIN D 25 Hydroxy   6. B12 deficiency  - Vitamin B12  7. Screening for colorectal cancer  - POC Hemoccult Bld/Stl  8. Screening for heart disease  - EKG 12-Lead  9. FHx: heart disease  - EKG 12-Lead - [place], RETROPERITNL ABD,  LTD  10. Screening for AAA (aortic abdominal aneurysm)  - [place], RETROPERITNL ABD,  LTD  11. Fatigue  - Urinalysis, Routine w reflex microscopic - Microalbumin / creatinine urine ratio - Iron, Total/Total Iron Binding Cap - Vitamin B12 - CBC with Differential/Platelet - TSH  12. Medication management  - CBC with Differential/Platelet - COMPLETE METABOLIC PANEL WITH GFR - Magnesium - Lipid panel - TSH - Hemoglobin A1c - Insulin, random - VITAMIN D 25 Hydroxy            Patient was counseled in prudent diet to achieve/maintain BMI less than 25 for weight  control, BP monitoring, regular exercise and medications. Discussed med's effects and SE's. Screening labs and tests as requested with regular follow-up as recommended. Over 40 minutes of exam, counseling, chart review and high complex critical decision making was performed.   Kirtland Bouchard, MD

## 2022-01-14 ENCOUNTER — Encounter: Payer: Self-pay | Admitting: Internal Medicine

## 2022-01-14 ENCOUNTER — Ambulatory Visit (INDEPENDENT_AMBULATORY_CARE_PROVIDER_SITE_OTHER): Payer: BC Managed Care – PPO | Admitting: Internal Medicine

## 2022-01-14 ENCOUNTER — Encounter: Payer: BC Managed Care – PPO | Admitting: Internal Medicine

## 2022-01-14 VITALS — BP 110/70 | HR 82 | Temp 97.9°F | Resp 17 | Ht 60.0 in | Wt 133.8 lb

## 2022-01-14 DIAGNOSIS — Z13220 Encounter for screening for lipoid disorders: Secondary | ICD-10-CM | POA: Diagnosis not present

## 2022-01-14 DIAGNOSIS — Z131 Encounter for screening for diabetes mellitus: Secondary | ICD-10-CM

## 2022-01-14 DIAGNOSIS — Z1211 Encounter for screening for malignant neoplasm of colon: Secondary | ICD-10-CM

## 2022-01-14 DIAGNOSIS — Z0001 Encounter for general adult medical examination with abnormal findings: Secondary | ICD-10-CM

## 2022-01-14 DIAGNOSIS — Z136 Encounter for screening for cardiovascular disorders: Secondary | ICD-10-CM | POA: Diagnosis not present

## 2022-01-14 DIAGNOSIS — Z0000 Encounter for general adult medical examination without abnormal findings: Secondary | ICD-10-CM | POA: Diagnosis not present

## 2022-01-14 DIAGNOSIS — I10 Essential (primary) hypertension: Secondary | ICD-10-CM | POA: Diagnosis not present

## 2022-01-14 DIAGNOSIS — E559 Vitamin D deficiency, unspecified: Secondary | ICD-10-CM | POA: Diagnosis not present

## 2022-01-14 DIAGNOSIS — E538 Deficiency of other specified B group vitamins: Secondary | ICD-10-CM

## 2022-01-14 DIAGNOSIS — R7309 Other abnormal glucose: Secondary | ICD-10-CM

## 2022-01-14 DIAGNOSIS — I700 Atherosclerosis of aorta: Secondary | ICD-10-CM

## 2022-01-14 DIAGNOSIS — Z8249 Family history of ischemic heart disease and other diseases of the circulatory system: Secondary | ICD-10-CM

## 2022-01-14 DIAGNOSIS — Z1389 Encounter for screening for other disorder: Secondary | ICD-10-CM

## 2022-01-14 DIAGNOSIS — Z130 Encounter for screening for diseases of the blood and blood-forming organs and certain disorders involving the immune mechanism: Secondary | ICD-10-CM | POA: Diagnosis not present

## 2022-01-14 DIAGNOSIS — Z79899 Other long term (current) drug therapy: Secondary | ICD-10-CM | POA: Diagnosis not present

## 2022-01-14 DIAGNOSIS — R5383 Other fatigue: Secondary | ICD-10-CM

## 2022-01-14 DIAGNOSIS — E782 Mixed hyperlipidemia: Secondary | ICD-10-CM

## 2022-01-15 LAB — TSH: TSH: 0.88 mIU/L (ref 0.40–4.50)

## 2022-01-15 LAB — URINALYSIS, ROUTINE W REFLEX MICROSCOPIC
Bilirubin Urine: NEGATIVE
Glucose, UA: NEGATIVE
Hgb urine dipstick: NEGATIVE
Ketones, ur: NEGATIVE
Leukocytes,Ua: NEGATIVE
Nitrite: NEGATIVE
Protein, ur: NEGATIVE
Specific Gravity, Urine: 1.011 (ref 1.001–1.035)
pH: 7 (ref 5.0–8.0)

## 2022-01-15 LAB — COMPLETE METABOLIC PANEL WITH GFR
AG Ratio: 1.9 (calc) (ref 1.0–2.5)
ALT: 17 U/L (ref 6–29)
AST: 16 U/L (ref 10–35)
Albumin: 4.2 g/dL (ref 3.6–5.1)
Alkaline phosphatase (APISO): 67 U/L (ref 37–153)
BUN: 18 mg/dL (ref 7–25)
CO2: 26 mmol/L (ref 20–32)
Calcium: 8.8 mg/dL (ref 8.6–10.4)
Chloride: 104 mmol/L (ref 98–110)
Creat: 0.85 mg/dL (ref 0.50–1.05)
Globulin: 2.2 g/dL (calc) (ref 1.9–3.7)
Glucose, Bld: 93 mg/dL (ref 65–99)
Potassium: 3.9 mmol/L (ref 3.5–5.3)
Sodium: 138 mmol/L (ref 135–146)
Total Bilirubin: 0.3 mg/dL (ref 0.2–1.2)
Total Protein: 6.4 g/dL (ref 6.1–8.1)
eGFR: 78 mL/min/{1.73_m2} (ref >=60)

## 2022-01-15 LAB — IRON, TOTAL/TOTAL IRON BINDING CAP
%SAT: 36 % (calc) (ref 16–45)
Iron: 104 ug/dL (ref 45–160)
TIBC: 292 mcg/dL (calc) (ref 250–450)

## 2022-01-15 LAB — CBC WITH DIFFERENTIAL/PLATELET
Absolute Monocytes: 651 cells/uL (ref 200–950)
Basophils Absolute: 74 cells/uL (ref 0–200)
Basophils Relative: 0.7 %
Eosinophils Absolute: 63 cells/uL (ref 15–500)
Eosinophils Relative: 0.6 %
HCT: 42.8 % (ref 35.0–45.0)
Hemoglobin: 14.5 g/dL (ref 11.7–15.5)
Lymphs Abs: 2090 cells/uL (ref 850–3900)
MCH: 30.8 pg (ref 27.0–33.0)
MCHC: 33.9 g/dL (ref 32.0–36.0)
MCV: 90.9 fL (ref 80.0–100.0)
MPV: 12.9 fL — ABNORMAL HIGH (ref 7.5–12.5)
Monocytes Relative: 6.2 %
Neutro Abs: 7623 cells/uL (ref 1500–7800)
Neutrophils Relative %: 72.6 %
Platelets: 319 10*3/uL (ref 140–400)
RBC: 4.71 10*6/uL (ref 3.80–5.10)
RDW: 12.8 % (ref 11.0–15.0)
Total Lymphocyte: 19.9 %
WBC: 10.5 10*3/uL (ref 3.8–10.8)

## 2022-01-15 LAB — MICROALBUMIN / CREATININE URINE RATIO
Creatinine, Urine: 39 mg/dL (ref 20–275)
Microalb, Ur: <0.2 mg/dL

## 2022-01-15 LAB — LIPID PANEL
Cholesterol: 152 mg/dL (ref <200)
HDL: 48 mg/dL — ABNORMAL LOW (ref >=50)
LDL Cholesterol (Calc): 72 mg/dL (calc)
Non-HDL Cholesterol (Calc): 104 mg/dL (calc) (ref <130)
Total CHOL/HDL Ratio: 3.2 (calc) (ref <5.0)
Triglycerides: 232 mg/dL — ABNORMAL HIGH (ref <150)

## 2022-01-15 LAB — VITAMIN D 25 HYDROXY (VIT D DEFICIENCY, FRACTURES): Vit D, 25-Hydroxy: 74 ng/mL (ref 30–100)

## 2022-01-15 LAB — HEMOGLOBIN A1C
Hgb A1c MFr Bld: 5.8 % of total Hgb — ABNORMAL HIGH (ref <5.7)
Mean Plasma Glucose: 120 mg/dL
eAG (mmol/L): 6.6 mmol/L

## 2022-01-15 LAB — INSULIN, RANDOM: Insulin: 24.1 u[IU]/mL — ABNORMAL HIGH

## 2022-01-15 LAB — MAGNESIUM: Magnesium: 2.2 mg/dL (ref 1.5–2.5)

## 2022-01-15 LAB — VITAMIN B12: Vitamin B-12: 1348 pg/mL — ABNORMAL HIGH (ref 200–1100)

## 2022-01-15 NOTE — Progress Notes (Signed)
<><><><><><><><><><><><><><><><><><><><><><><><><><><><><><><><><> <><><><><><><><><><><><><><><><><><><><><><><><><><><><><><><><><> -   Test results slightly outside the reference range are not unusual. If there is anything important, I will review this with you,  otherwise it is considered normal test values.  If you have further questions,  please do not hesitate to contact me at the office or via My Chart.  <><><><><><><><><><><><><><><><><><><><><><><><><><><><><><><><><> <><><><><><><><><><><><><><><><><><><><><><><><><><><><><><><><><>  -  Vitamin B12 is very high - So it's OK to cut back to 2 x /week  <><><><><><><><><><><><><><><><><><><><><><><><><><><><><><><><><>  -  Total Chol = 152   &   LDL Chol  = 72  - Both  Excellent   - Very low risk for Heart Attack  / Stroke <><><><><><><><><><><><><><><><><><><><><><><><><><><><><><><><><>  -  but Triglycerides (  = 232 ) or fats in blood are too high                 (   Ideal or  Goal is less than 150  !  )    - Recommend avoid fried & greasy foods,  sweets / candy,   - Avoid white rice  (brown or wild rice or Quinoa is OK),   - Avoid white potatoes  (sweet potatoes are OK)   - Avoid anything made from white flour  - bagels, doughnuts, rolls, buns, biscuits, white and   wheat breads, pizza crust and traditional  pasta made of white flour & egg white  - (vegetarian pasta or spinach or wheat pasta is OK).    - Multi-grain bread is OK - like multi-grain flat bread or  sandwich thins.   - Avoid alcohol in excess.   - Exercise is also important. <><><><><><><><><><><><><><><><><><><><><><><><><><><><><><><><><>  - A1c 9 12 week average blood sugar) is still too high     - Avoid Sweets, Candy & White Stuff   - White Rice, White Potatoes, White Flour  - Breads &  Pasta  - also suggest  " NatureBell " [place] cinnamon  9,000 mg on [employer] as                          has shown to increase Insulin sensitivity and                                                             therefore decrease sugars & A1c levels   - Also taking 1 ounce of vinegar in any liquid  Daily helps lower blood sugars ! <><><><><><><><><><><><><><><><><><><><><><><><><><><><><><><><><> <><><><><><><><><><><><><><><><><><><><><><><><><><><><><><><><><>  -  Iron levels - Normal <><><><><><><><><><><><><><><><><><><><><><><><><><><><><><><><><>  -  Vitamin D = 74 - Excellent  - Please keep dose same  <><><><><><><><><><><><><><><><><><><><><><><><><><><><><><><><><>  - All Else - CBC - Kidneys - Electrolytes - Liver - Magnesium & Thyroid    - all  Normal / OK <><><><><><><><><><><><><><><><><><><><><><><><><><><><><><><><><>  -  Keep up the [place] work !  <><><><><><><><><><><><><><><><><><><><><><><><><><><><><><><><><> <><><><><><><><><><><><><><><><><><><><><><><><><><><><><><><><><>

## 2022-01-19 ENCOUNTER — Other Ambulatory Visit: Payer: Self-pay | Admitting: Gastroenterology

## 2022-02-04 DIAGNOSIS — M9901 Segmental and somatic dysfunction of cervical region: Secondary | ICD-10-CM | POA: Diagnosis not present

## 2022-02-09 ENCOUNTER — Encounter: Payer: Self-pay | Admitting: Gastroenterology

## 2022-02-09 ENCOUNTER — Ambulatory Visit: Payer: BC Managed Care – PPO | Admitting: Gastroenterology

## 2022-02-09 VITALS — BP 116/76 | HR 62 | Ht 60.0 in | Wt 136.0 lb

## 2022-02-09 DIAGNOSIS — K52832 Lymphocytic colitis: Secondary | ICD-10-CM

## 2022-02-09 DIAGNOSIS — K219 Gastro-esophageal reflux disease without esophagitis: Secondary | ICD-10-CM | POA: Diagnosis not present

## 2022-02-09 DIAGNOSIS — R1010 Upper abdominal pain, unspecified: Secondary | ICD-10-CM | POA: Diagnosis not present

## 2022-02-09 MED ORDER — ESOMEPRAZOLE MAGNESIUM 40 MG PO CPDR: 40.0000 mg | DELAYED_RELEASE_CAPSULE | ORAL | 3 refills | Status: DC

## 2022-02-09 MED ORDER — FAMOTIDINE 20 MG PO TABS: 20.0000 mg | ORAL_TABLET | Freq: Two times a day (BID) | ORAL | 1 refills | Status: DC

## 2022-02-09 NOTE — Patient Instructions (Signed)
If you are age 62 or older, your body mass index should be between 23-30. Your Body mass index is 26.56 kg/m. If this is out of the aforementioned range listed, please consider follow up with your Primary Care Provider.  If you are age 36 or younger, your body mass index should be between 19-25. Your Body mass index is 26.56 kg/m. If this is out of the aformentioned range listed, please consider follow up with your Primary Care Provider.   ________________________________________________________   Complete course of budesonide.  Avoid NSAIDs (81 mg aspirin is OK).  Use Tylenol as needed  Taper off Nexium. Take every other day for 2 weeks then stop.  We have sent the following medications to your pharmacy for you to pick up at your convenience: Pepcid 20 mg : Take twice daily  Let us know if you reflux is not managed well with this next regime.  Thank you for entrusting me with your care and for choosing [facility], Dr. [place] Cellar

## 2022-02-09 NOTE — Progress Notes (Signed)
HPI :  62 year old female here for follow-up for recently diagnosed lymphocytic colitis.  Recall she was seen in the office back in October by Alonza Bogus.  She had been complaining of persistent loose stools over months associated with urgency, weight loss, incontinence.  GI pathogen panel was negative.  Eventually led to colonoscopy with me in November.  No concerning findings on overt exam however biopsies were positive for lymphocytic colitis.  She was tested for celiac disease with serologies which were negative.  I had recommended a course of budesonide to treat this.  9 mg daily that slow taper to 3 mg daily.  I also recommended that she wean off Nexium.  She had been on baby aspirin as well for history of TIA but continue that.  She states in general she is feeling so much better since the budesonide was started.  This works quite quickly to normalize her bowels.  She states she was previously going "all the time" and had lost upwards of 20 pounds due to symptoms.  With treatment she is having 1 bowel movement per day which is formed, no urgency, no incontinence.  She is regaining weight.  She is currently on the budesonide taper and taking 3 mg daily and due to stop it this week.  She did not realize she was post to taper off the Nexium.  Has been on that for the past few years for history of reflux and heartburn at 40 mg daily.  She also takes Pepcid as needed.  Also at the time of her procedure she was recovering from a knee replacement and had been taking Aleve on a fairly routine basis, she has not taken that more recently.  She inquires about what she can take for pain.  Of note she had been having some occasional pains in her abdomen that were fairly frequent prior to her going on budesonide.  She was having periumbilical to epigastric pain that was previously constant, has since become much less bothersome, perhaps once or twice per week he may have some discomfort there but it is not  related to eating at all, comes and goes and is more mild.  Generally improving.  She had a CT scan of her abdomen pelvis to evaluate this in December which was negative.  Prior workup: Colonoscopy 11/28/21: The perianal and digital rectal examinations were normal.                           The terminal ileum appeared normal.                           Scattered small-mouthed diverticula were found in                            the ascending colon and left colon.                           Internal hemorrhoids were found during                            retroflexion. The hemorrhoids were small.                           The exam was otherwise without abnormality.  Biopsies for histology were taken with a cold                            forceps from the right colon, left colon and                            transverse colon for evaluation of microscopic                            colitis.   Surgical [P], colon nos, random sites - LYMPHOCYTIC COLITIS - SEE NOTE  Labs done to rule out celiac - negative   CT abdomen / pelvis 12/15/21: IMPRESSION: 1. No inflammation of the small bowel or colon. 2. No inflammation or infection in the abdomen pelvis. 3. No abdominal hernia.   Past Medical History:  Diagnosis Date   Allergy    Anxiety    Asthma    Cardiomyopathy ([place])    a. Echo 8/16:  EF 40-45%, apical and ant-septal HK, Gr 2 DD   Diverticula, colon    Family history of adverse reaction to anesthesia    sisiter had PONV   Headache    rare migraines   History of cardiovascular stress test    Myoview 8/16:  EF 48%, anterior and apical defect c/w breast atten, no ischemia; Low Risk // Nuclear stress test 9/18: Low risk stress nuclear study with fixed septal defect consistent with LBBB vs prior infarct; no ischemia; EF 53 with septal akinesis.   Hyperlipidemia    Hypertension    Irregular periods/menstrual cycles    LBBB (left bundle branch block)     Melanoma (HCC)    right arm   Seasonal allergies    TIA (transient ischemic attack)    Torn meniscus      Past Surgical History:  Procedure Laterality Date   ABLATION     BREAST REDUCTION SURGERY     CARDIAC CATHETERIZATION     CESAREAN SECTION     x2   KNEE SURGERY     partial knee replacement right   MELANOMA EXCISION WITH SENTINEL LYMPH NODE BIOPSY Right 09/15/2017   Procedure: BIOPSY RIGHT SHOULDER MOLE AND WIDE LOCAL EXCISION OF RIGHT ARM MELANOMA, ADVANCEMENT FLAP CLOSURE, SENTINEL LYMPH NODE BIOPSY  RIGHT ARM MELANOMA;  Surgeon: Stark Klein, MD;  Location: [facility];  Service: General;  Laterality: Right;   REDUCTION MAMMAPLASTY Bilateral    REPAIR QUADRICEPS / HAMSTRING MUSCLE     REPLACEMENT TOTAL KNEE Left 09/20/2018   Dr. Okey Dupre at [facility]     tubes in ears  10/12   Family History  Problem Relation Age of Onset   Diabetes Mother    Heart disease Mother    Hypertension Mother    Heart attack Mother 59   Other Father        MVA   Asthma Sister    Asthma Son    Cancer Maternal Grandmother        Uterine   Breast cancer Maternal Grandmother    Heart attack Paternal Grandmother    Colon cancer Neg Hx    Colon polyps Neg Hx    Esophageal cancer Neg Hx    Stomach cancer Neg Hx    Rectal cancer Neg Hx    Social History   Tobacco Use   Smoking status: Never  Smokeless tobacco: Never  Vaping Use   Vaping Use: Never used  Substance Use Topics   Alcohol use: Yes    Comment: 1-2 glasses wine nightly   Drug use: No   Current Outpatient Medications  Medication Sig Dispense Refill   acyclovir ointment (ZOVIRAX) 5 % Apply 1 application topically every 3 (three) hours. 16 g 1   Adapalene-Benzoyl Peroxide (EPIDUO) 0.1-2.5 % gel Apply 1 application topically as needed (acne). 45 g 0   amLODipine (NORVASC) 5 MG tablet TAKE 1 TABLET BY MOUTH EVERY DAY FOR BLOOD PRESSURE 90 tablet 3   aspirin EC 81 MG tablet Take 1 tablet (81 mg total)  by mouth daily.     budesonide (ENTOCORT EC) 3 MG 24 hr capsule Take 3 capsules (9 mg total) by mouth daily for 42 days, THEN 2 capsules (6 mg total) daily for 14 days, THEN 1 capsule (3 mg total) daily for 14 days. 168 capsule 0   Cholecalciferol (VITAMIN D PO) Take 5,000 Units by mouth daily.      diphenoxylate-atropine (LOMOTIL) 2.5-0.025 MG tablet Take 1 tablet by mouth 4 (four) times daily as needed for diarrhea or loose stools. 30 tablet 1   escitalopram (LEXAPRO) 10 MG tablet TAKE 1 TABLET DAILY FOR MOOD & ANXIETY 90 tablet 3   esomeprazole (NEXIUM) 40 MG capsule TAKE 1 CAPSULE DAILY TO PREVENT HEARTBURN & INDIGESTION 90 capsule 3   estradiol (ESTRACE) 0.5 MG tablet Take 0.5 mg by mouth daily.     fluticasone (FLONASE) 50 MCG/ACT nasal spray SPRAY 2 SPRAYS INTO EACH NOSTRIL EVERY DAY 48 mL 1   furosemide (LASIX) 40 MG tablet TAKE 1 TABLET EVERY DAY FOR BLOOD PRESSURE AND FLUID 90 tablet 3   hyoscyamine (LEVSIN SL) 0.125 MG SL tablet Place 1 tablet (0.125 mg total) under the tongue every 6 (six) hours as needed. 30 tablet 1   medroxyPROGESTERone (PROVERA) 2.5 MG tablet Take 2.5 mg by mouth daily.     metoprolol succinate (TOPROL-XL) 25 MG 24 hr tablet TAKE 1/2 TABLET BY MOUTH EVERY DAY 45 tablet 3   montelukast (SINGULAIR) 10 MG tablet TAKE 1 TABLET BY MOUTH DAILY FOR ALLERGIES 90 tablet 3   olmesartan (BENICAR) 20 MG tablet TAKE 1 TABLET BY MOUTH DAILY. 90 tablet 1   Omega-3 Fatty Acids (FISH OIL PO) Take 1 capsule by mouth daily.     potassium chloride (KLOR-CON) 10 MEQ tablet Take 1 tablet (10 mEq total) by mouth 2 (two) times daily. 180 tablet 3   rosuvastatin (CRESTOR) 10 MG tablet Take 1 tablet (10 mg total) by mouth daily. 90 tablet 3   topiramate (TOPAMAX) 50 MG tablet Take 1 tablet  2 x /day  at Suppertime & Bedtime  for Dieting & Weight Loss 180 tablet 3   valACYclovir (VALTREX) 500 MG tablet Take  1 tablet  2 x /day as needed for Viral Suppression 180 tablet 2   albuterol  (VENTOLIN HFA) 108 (90 Base) MCG/ACT inhaler INHALE 2 PUFFS INTO LUNGS EVERY 4 HOURS AS NEEDED FOR WHEEZING OR SHORTNESS OF BREATH (Patient not taking: Reported on 02/09/2022) 6.7 each 1   No current facility-administered medications for this visit.   Allergies  Allergen Reactions   Metoprolol     Not allergy, but intolerance - had bradycardia with HR in the 40s even with low dose   Oxycodone Itching    Itching: Trunk, palms of hands, legs, back       Review of Systems: All systems  reviewed and negative except where noted in HPI.   Lab Results  Component Value Date   WBC 10.5 01/14/2022   HGB 14.5 01/14/2022   HCT 42.8 01/14/2022   MCV 90.9 01/14/2022   PLT 319 01/14/2022    Lab Results  Component Value Date   CREATININE 0.85 01/14/2022   BUN 18 01/14/2022   NA 138 01/14/2022   K 3.9 01/14/2022   CL 104 01/14/2022   CO2 26 01/14/2022    Lab Results  Component Value Date   ALT 17 01/14/2022   AST 16 01/14/2022   ALKPHOS 96 06/11/2020   BILITOT 0.3 01/14/2022     Physical Exam: BP 116/76   Pulse 62   Ht 5' (1.524 m)   Wt 136 lb (61.7 kg)   BMI 26.56 kg/m  Constitutional: Pleasant,well-developed, female in no acute distress. Skin: Skin is warm and dry. No rashes noted. Psychiatric: Normal mood and affect. Behavior is normal.   ASSESSMENT: 62 y.o. female here for assessment of the following  1. Lymphocytic colitis   2. Gastroesophageal reflux disease, unspecified whether esophagitis present   3. Pain of upper abdomen    Recently diagnosed lymphocytic colitis causing extreme bowel symptoms as outlined above.  In review of her medications at the time, certainly routine NSAID use such as Aleve could precipitated this.  PPI use with Nexium also could be possible.  She takes a baby aspirin for history of TIA which could also be involved.  Fortunately with budesonide course her symptoms have completely resolved, she is also minimized her NSAID use.  I think it is  okay for her to take baby aspirin in light of her TIA history but would recommend avoiding other NSAIDs at this point in time which we discussed.  She understands this.  Would recommend use of Tylenol as needed for aches and pains.  I would also try her weaning off the Nexium if she can in case that was related to her lymphocytic colitis as well.  She will try managing this with Pepcid 20 mg twice daily and see if this works for her, if not and her reflux symptoms really bother her she can use Nexium as needed.  She will taper off Nexium slowly.  If any recurrent symptoms she should contact me, would start with Imodium as needed initially.  Hopefully with removing routine NSAIDs Nexium from her regimen this does not come back.  She tested negative for celiac disease.  Otherwise CT scan showed no clear cause for her symptoms of mid to upper abdominal pain which have since significantly improved.  Will monitor for now and see how she does over time.  If this persists or does not resolve she can contact me.  Biliary colic seems unlikely given description of her symptoms to date so far.   PLAN: - complete budesonide course - avoid NSAIDs other than baby aspirin (she will continue baby aspirin given history of TIA) - taper off nexium - take every other day for 2 weeks and then stop - manage reflux with pepcid 20 mg BID - call if reflux poorly controlled - use tylenol for joint pains - f/u PRN - if symptoms recur can start with immodium  I spent 30 minutes of time, including in depth chart review, face-to-face time with the patient, and documentation.  Jolly Mango, MD [facility]

## 2022-02-23 DIAGNOSIS — M9901 Segmental and somatic dysfunction of cervical region: Secondary | ICD-10-CM | POA: Diagnosis not present

## 2022-02-26 ENCOUNTER — Encounter: Payer: Self-pay | Admitting: Internal Medicine

## 2022-03-02 ENCOUNTER — Other Ambulatory Visit: Payer: Self-pay | Admitting: Internal Medicine

## 2022-03-02 ENCOUNTER — Encounter: Payer: Self-pay | Admitting: Internal Medicine

## 2022-03-02 DIAGNOSIS — F411 Generalized anxiety disorder: Secondary | ICD-10-CM

## 2022-03-02 MED ORDER — BUSPIRONE HCL 10 MG PO TABS: ORAL_TABLET | ORAL | 0 refills | Status: DC

## 2022-03-02 MED ORDER — ESCITALOPRAM OXALATE 20 MG PO TABS: ORAL_TABLET | ORAL | 1 refills | Status: DC

## 2022-03-10 ENCOUNTER — Other Ambulatory Visit: Payer: Self-pay | Admitting: Cardiovascular Disease

## 2022-03-11 ENCOUNTER — Other Ambulatory Visit: Payer: Self-pay

## 2022-03-11 DIAGNOSIS — I10 Essential (primary) hypertension: Secondary | ICD-10-CM

## 2022-03-11 MED ORDER — AMLODIPINE BESYLATE 5 MG PO TABS: ORAL_TABLET | ORAL | 3 refills | Status: DC

## 2022-03-13 ENCOUNTER — Other Ambulatory Visit: Payer: Self-pay | Admitting: Cardiovascular Disease

## 2022-03-16 DIAGNOSIS — M9901 Segmental and somatic dysfunction of cervical region: Secondary | ICD-10-CM | POA: Diagnosis not present

## 2022-03-24 ENCOUNTER — Other Ambulatory Visit: Payer: Self-pay | Admitting: Internal Medicine

## 2022-03-24 DIAGNOSIS — F411 Generalized anxiety disorder: Secondary | ICD-10-CM

## 2022-03-30 DIAGNOSIS — Z1231 Encounter for screening mammogram for malignant neoplasm of breast: Secondary | ICD-10-CM | POA: Diagnosis not present

## 2022-03-30 DIAGNOSIS — Z6825 Body mass index (BMI) 25.0-25.9, adult: Secondary | ICD-10-CM | POA: Diagnosis not present

## 2022-03-30 DIAGNOSIS — Z01419 Encounter for gynecological examination (general) (routine) without abnormal findings: Secondary | ICD-10-CM | POA: Diagnosis not present

## 2022-03-30 LAB — HM MAMMOGRAPHY

## 2022-04-07 ENCOUNTER — Other Ambulatory Visit: Payer: Self-pay | Admitting: Cardiovascular Disease

## 2022-04-11 ENCOUNTER — Other Ambulatory Visit: Payer: Self-pay | Admitting: Cardiovascular Disease

## 2022-04-13 ENCOUNTER — Encounter: Payer: Self-pay | Admitting: Gastroenterology

## 2022-04-14 ENCOUNTER — Other Ambulatory Visit: Payer: Self-pay

## 2022-04-14 MED ORDER — BUDESONIDE 3 MG PO CPEP: ORAL_CAPSULE | ORAL | 0 refills | Status: AC

## 2022-04-21 ENCOUNTER — Other Ambulatory Visit: Payer: Self-pay | Admitting: Cardiovascular Disease

## 2022-04-30 ENCOUNTER — Ambulatory Visit: Payer: BC Managed Care – PPO | Admitting: Nurse Practitioner

## 2022-04-30 ENCOUNTER — Other Ambulatory Visit: Payer: Self-pay | Admitting: Gastroenterology

## 2022-04-30 ENCOUNTER — Encounter: Payer: Self-pay | Admitting: Nurse Practitioner

## 2022-04-30 VITALS — BP 110/62 | HR 56 | Temp 97.5°F | Ht 60.0 in | Wt 136.8 lb

## 2022-04-30 DIAGNOSIS — E782 Mixed hyperlipidemia: Secondary | ICD-10-CM | POA: Diagnosis not present

## 2022-04-30 DIAGNOSIS — F411 Generalized anxiety disorder: Secondary | ICD-10-CM | POA: Diagnosis not present

## 2022-04-30 DIAGNOSIS — G478 Other sleep disorders: Secondary | ICD-10-CM

## 2022-04-30 DIAGNOSIS — I2510 Atherosclerotic heart disease of native coronary artery without angina pectoris: Secondary | ICD-10-CM

## 2022-04-30 DIAGNOSIS — I10 Essential (primary) hypertension: Secondary | ICD-10-CM

## 2022-04-30 DIAGNOSIS — E559 Vitamin D deficiency, unspecified: Secondary | ICD-10-CM

## 2022-04-30 DIAGNOSIS — N951 Menopausal and female climacteric states: Secondary | ICD-10-CM

## 2022-04-30 DIAGNOSIS — R7309 Other abnormal glucose: Secondary | ICD-10-CM | POA: Diagnosis not present

## 2022-04-30 DIAGNOSIS — E538 Deficiency of other specified B group vitamins: Secondary | ICD-10-CM

## 2022-04-30 DIAGNOSIS — R6882 Decreased libido: Secondary | ICD-10-CM

## 2022-04-30 NOTE — Patient Instructions (Signed)
Flibanserin Tablets What is this medication? FLIBANSERIN (fly BAN ser in) treats hypoactive sexual desire disorder (HSDD). It works by balancing substances in the brain that help regulate mood and increase sex drive. This medicine may be used for other purposes; ask your health care provider or pharmacist if you have questions. COMMON BRAND NAME(S): Addyi What should I tell my care team before I take this medication? They need to know if you have any of these conditions: Dehydration Frequently drink alcohol Heart disease History of depression or other mental health conditions Liver disease Low blood pressure Substance use disorder An unusual or allergic reaction to flibanserin, other medications, foods, dyes, or preservatives Pregnant or trying to get pregnant Breast-feeding How should I use this medication? Take this medication by mouth with water. Take it as directed on the prescription label. This medication should only be taken at bedtime. Do not take this medication with grapefruit juice. If you have 1 or 2 alcohol-containing drinks, wait at least 2 hours before taking this medication at bedtime. Do not take your bedtime dose if you have consumed 3 or more alcohol-containing drinks. After taking this medication at bedtime, do not drink alcohol until the next day. A special MedGuide will be given to you by the pharmacist with each prescription and refill. Be sure to read this information carefully each time. Talk to your care team about the use of this medication in children. It is not approved for use in children. Overdosage: If you think you have taken too much of this medicine contact a poison control center or emergency room at once. NOTE: This medicine is only for you. Do not share this medicine with others. What if I miss a dose? If you miss your dose at bedtime, skip the missed dose and take the next dose at bedtime the next day. Do not take this medication the next morning or  double your next dose. What may interact with this medication? Do not take this medication with any of the following: Adagrasib Aprepitant, fosaprepitant Berotralstat Ceritinib Certain antibiotics, such as chloramphenicol, ciprofloxacin, clarithromycin, erythromycin, telithromycin Certain antivirals for HIV or hepatitis Certain medications for fungal infections, such as fluconazole, ketoconazole, isavuconazonium, itraconazole, posaconazole Conivaptan Crizotinib Cyclosporine Diltiazem Dronedarone Duvelisib Fedratinib Grapefruit juice Idelalisib Imatinib Lefamulin Letermovir Lonafarnib Mifepristone Nefazodone Netupitant Nilotinib Ribociclib Tucatinib Verapamil Voxelotor This medication may also interact with the following: Alcohol Certain medications for anxiety or sleep Certain medications for seizures, such as carbamazepine, phenobarbital, phenytoin Certain medications for stomach problems, such as cimetidine, esomeprazole, dexlansoprazole, lansoprazole, omeprazole, pantoprazole, rabeprazole, ranitidine Digoxin Diphenhydramine Estrogen or progestin hormones Etravirine Fluoxetine Fluvoxamine Ginkgo biloba Opioids for pain or cough Rifabutin Rifampin Rifapentine Sirolimus St. John's wort This list may not describe all possible interactions. Give your health care provider a list of all the medicines, herbs, non-prescription drugs, or dietary supplements you use. Also tell them if you smoke, drink alcohol, or use illegal drugs. Some items may interact with your medicine. What should I watch for while using this medication? Visit your care team for regular checks on your progress. Tell your care team if your symptoms do not start to get better after you have taken this medication for 8 weeks. You may get drowsy or dizzy. Do not drive, use machinery, or do anything that needs mental alertness for at least 6 hours after your dose and until you know how this medication  affects you. Do not stand up or sit up quickly. This reduces the risk of dizzy or fainting  spells. Alcohol can increase dizziness and drowsiness, and can increase the risk of low blood pressure or fainting spells when combined with this medication. Wait at least 2 hours after consuming 1 or 2 standard alcoholic drinks before taking this medication at bedtime. Do not take this medication at bedtime if you have consumed 3 or more standard alcoholic drinks that evening. What side effects may I notice from receiving this medication? Side effects that you should report to your care team as soon as possible: Allergic reactions or angioedema--skin rash, itching or hives, swelling of the face, eyes, lips, tongue, arms, or legs, trouble swallowing or breathing CNS depression--slow or shallow breathing, shortness of breath, feeling faint, dizziness, confusion, trouble staying awake Low blood pressure--dizziness, feeling faint or lightheaded, blurry vision Side effects that usually do not require medical attention (report to your care team if they continue or are bothersome): Dizziness Dry mouth Falling asleep during daily activities Fatigue Nausea Trouble sleeping This list may not describe all possible side effects. Call your doctor for medical advice about side effects. You may report side effects to FDA at 1-800-FDA-1088. Where should I keep my medication? Keep out of the reach of children and pets. Store at [employer] C (77 degrees F). Get rid of any unused medication after the expiration date. To get rid of medications that are no longer needed or have expired: Take the medication to a medication take-back program. Check with your pharmacy or law enforcement to find a location. If you cannot return the medication, check the label or package insert to see if the medication should be thrown out in the garbage or flushed down the toilet. If you are not sure, ask your care team. If it is safe to put it in  the trash, take the medication out of the container. Mix the medication with cat litter, dirt, coffee grounds, or other unwanted substance. Seal the mixture in a bag or container. Put it in the trash. NOTE: This sheet is a summary. It may not cover all possible information. If you have questions about this medicine, talk to your doctor, pharmacist, or health care provider.  2023 Elsevier/Gold Standard (2013-09-08 00:00:00) Fezolinetant Tablets What is this medication? FEZOLINETANT (FEZ oh LIN e tant) reduces the number and severity of hot flashes due to menopause. It works by blocking substances in your body that cause hot flashes and night sweats. This medicine may be used for other purposes; ask your health care provider or pharmacist if you have questions. COMMON BRAND NAME(S): VEOZAH What should I tell my care team before I take this medication? They need to know if you have any of these conditions: Kidney disease Liver disease An unusual or allergic reaction to fezolinetant, other medications, foods, dyes, or preservatives Pregnant or trying to get pregnant Breastfeeding How should I use this medication? Take this medication by mouth with water. Take it as directed on the prescription label at the same time every day. Do not cut, crush, or chew this medication. Swallow the tablets whole. You can take it with or without food. If it upsets your stomach, take it with food. Keep taking it unless your care team tells you to stop. Talk to your care team about the use of this medication in children. Special care may be needed. Overdosage: If you think you have taken too much of this medicine contact a poison control center or emergency room at once. NOTE: This medicine is only for you. Do not share this medicine  this medicine with others. What if I miss a dose? If you miss a dose, take it as soon as you can unless it is more than 12 hours late. If it is more than 12 hours late, skip the missed dose. Take  the next dose at the normal time. What may interact with this medication? Other medications may affect the way this medication works. Talk with your care team about all of the medications you take. They may suggest changes to your treatment plan to lower the risk of side effects and to make sure your medications work as intended. This list may not describe all possible interactions. Give your health care provider a list of all the medicines, herbs, non-prescription drugs, or dietary supplements you use. Also tell them if you smoke, drink alcohol, or use illegal drugs. Some items may interact with your medicine. What should I watch for while using this medication? Visit your care team for regular checks on your progress. Tell your care team if your symptoms do not start to get better or if they get worse. You may need blood work while taking this medication. What side effects may I notice from receiving this medication? Side effects that you should report to your care team as soon as possible: Allergic reactions--skin rash, itching, hives, swelling of the face, lips, tongue, or throat Liver injury--right upper belly pain, loss of appetite, nausea, light-colored stool, dark yellow or brown urine, yellowing skin or eyes, unusual weakness or fatigue Side effects that usually do not require medical attention (report these to your care team if they continue or are bothersome): Back pain Diarrhea Hot flashes Stomach pain Trouble sleeping This list may not describe all possible side effects. Call your doctor for medical advice about side effects. You may report side effects to FDA at 1-800-FDA-1088. Where should I keep my medication? Keep out of the reach of children and pets. Store at room temperature between 20 and 25 degrees C (68 and 77 degrees F). Get rid of any unused medication after the expiration date. To get rid of medications that are no longer needed or have expired: Take the medication to  a medication take-back program. Check with your pharmacy or law enforcement to find a location. If you cannot return the medication, check the label or package insert to see if the medication should be thrown out in the garbage or flushed down the toilet. If you are not sure, ask your care team. If it is safe to put it in the trash, take the medication out of the container. Mix the medication with cat litter, dirt, coffee grounds, or other unwanted substance. Seal the mixture in a bag or container. Put it in the trash. NOTE: This sheet is a summary. It may not cover all possible information. If you have questions about this medicine, talk to your doctor, pharmacist, or health care provider.  2023 Elsevier/Gold Standard (2021-06-05 00:00:00)  

## 2022-04-30 NOTE — Progress Notes (Signed)
Assessment and Plan:  Mardie Kellen was seen today for a general follow up.  Diagnoses and all orders for this visit:  Essential hypertension Controlled - Continue Amlodipine, ASA, Metoprolol, Benicar, Lasix, K+ Discussed DASH (Dietary Approaches to Stop Hypertension) DASH diet is lower in sodium than a typical American diet. Cut back on foods that are high in saturated fat, cholesterol, and trans fats. Eat more whole-grain foods, fish, poultry, and nuts Remain active and exercise as tolerated daily.  Monitor BP at home-Call if greater than 130/80.  Check CMP/CBC  Generalized anxiety disorder Continue Buspar, Lexapro.   Reviewed relaxation techniques.  Sleep hygiene. Recommended Cognitive Behavioral Therapy (CBT). Recommended mindfulness meditation and exercise.   Insight-oriented psychotherapy given for 16 minutes exclusively. Psychoeducation:  encouraged personality growth wand development through coping techniques and problem-solving skills. Limit/Decrease/Monitor drug/alcohol intake.    Hyperlipidemia, mixed/Coronary artery disease involving native coronary artery of native heart without angina pectoris Elevated triglycerides Continue Rosuvastatin, Fish Oil Discussed lifestyle modifications. Recommended diet heavy in fruits and veggies, omega 3's. Decrease consumption of animal meats, cheeses, and dairy products. Remain active and exercise as tolerated. Continue to monitor. Defer lipids check today per patient  Abnormal glucose Education: Reviewed 'ABCs' of diabetes management  Discussed goals to be met and/or maintained include A1C (<7) Blood pressure (<130/80) Cholesterol (LDL <70) Continue Eye Exam yearly  Continue Dental Exam Q6 mo Discussed dietary recommendations Discussed Physical Activity recommendations Check A1C  Vitamin D deficiency Continue supplement for goal of 60-100 Monitor Vitamin D levels  B12 deficiency Continue supplement  Non-restorative  sleep/hot flashes/sweats/low libido Continue Estradial  Secondary to menopausal state Discussed benefit of medication Veozah and Addy. Patient requests to research Continue to monitor  Review of last lab works table - patient wishes to der to [facility].  No orders of the defined types were placed in this encounter.  Notify office for further evaluation and treatment, questions or concerns if s/s fail to improve. The risks and benefits of my recommendations, as well as other treatment options were discussed with the patient today. Questions were answered.  Further disposition pending results of labs. Discussed med's effects and SE's.    Over 20 minutes of exam, counseling, chart review, and critical decision making was performed.   Future Appointments  Date Time Provider Department Center  06/18/2022 11:00 AM Armbruster, Willaim Rayas, MD [facility]  08/18/2022 10:30 AM Lucky Cowboy, MD [facility] None  01/15/2023 11:00 AM Lucky Cowboy, MD [facility] None    ------------------------------------------------------------------------------------------------------------------   HPI BP 110/62   Pulse (!) 56   Temp (!) 97.5 F (36.4 C)   Ht 5' (1.524 m)   Wt 136 lb 12.8 oz (62.1 kg)   SpO2 96%   BMI 26.72 kg/m   62 y.o.female presents for general follow up.  Overall she reports feeling well today.  She continues to take HRT Estradiol daily.  Continues to have insomnia, low libido and hot flashes.  Has take black cohosh in the past without benefit.  She did have have placement of bio-identical testosterone hormone pellet replacement in that area to the lower back several months ago, does not wish to proceed forward.  BP has been well controlled.  She denies any CP, heart palpitations, SOB. BP Readings from Last 3 Encounters:  04/30/22 110/62  02/09/22 116/76  01/14/22 110/70   Continues to have stable mood with low anxiety.  On Lexapro and Buspar with benefit.    She is on  cholesterol medication Rosuvastatin and takes  omega 3 fish ol and denies myalgias. Her cholesterol is not at goal with elevated triglycerides. The cholesterol last visit was:   Lab Results  Component Value Date   CHOL 152 01/14/2022   HDL 48 (L) 01/14/2022   LDLCALC 72 01/14/2022   TRIG 232 (H) 01/14/2022   CHOLHDL 3.2 01/14/2022   She takes daily Vitamin D supplement. Last vitamin D Lab Results  Component Value Date   VD25OH 28 01/14/2022   She takes a daily B12 supplement.  Last level was elevated due to recent injection before lab draw. Lab Results  Component Value Date   VITAMINB12 1,348 (H) 01/14/2022       Past Medical History:  Diagnosis Date   Allergy    Anxiety    Asthma    Cardiomyopathy    a. Echo 8/16:  EF 40-45%, apical and ant-septal HK, Gr 2 DD   Diverticula, colon    Family history of adverse reaction to anesthesia    sisiter had PONV   Headache    rare migraines   History of cardiovascular stress test    Myoview 8/16:  EF 48%, anterior and apical defect c/w breast atten, no ischemia; Low Risk // Nuclear stress test 9/18: Low risk stress nuclear study with fixed septal defect consistent with LBBB vs prior infarct; no ischemia; EF 53 with septal akinesis.   Hyperlipidemia    Hypertension    Irregular periods/menstrual cycles    LBBB (left bundle branch block)    Melanoma    right arm   Seasonal allergies    TIA (transient ischemic attack)    Torn meniscus      Allergies  Allergen Reactions   Metoprolol     Not allergy, but intolerance - had bradycardia with HR in the 40s even with low dose   Oxycodone Itching    Itching: Trunk, palms of hands, legs, back      Current Outpatient Medications on File Prior to Visit  Medication Sig   acyclovir ointment (ZOVIRAX) 5 % Apply 1 application topically every 3 (three) hours.   Adapalene-Benzoyl Peroxide (EPIDUO) 0.1-2.5 % gel Apply 1 application topically as needed (acne).   albuterol (VENTOLIN HFA) 108  (90 Base) MCG/ACT inhaler INHALE 2 PUFFS INTO LUNGS EVERY 4 HOURS AS NEEDED FOR WHEEZING OR SHORTNESS OF BREATH   amLODipine (NORVASC) 5 MG tablet TAKE 1 TABLET BY MOUTH EVERY DAY FOR BLOOD PRESSURE   aspirin EC 81 MG tablet Take 1 tablet (81 mg total) by mouth daily.   budesonide (ENTOCORT EC) 3 MG 24 hr capsule Take 3 capsules (9 mg total) by mouth daily for 42 days, THEN 2 capsules (6 mg total) daily for 14 days, THEN 1 capsule (3 mg total) daily for 14 days.   busPIRone (BUSPAR) 10 MG tablet TAKE 1/2 TO 1 TABLET 3 TIMES A DAY FOR ANXIETY   Cholecalciferol (VITAMIN D PO) Take 5,000 Units by mouth daily.    escitalopram (LEXAPRO) 20 MG tablet Take 1 tablet Daily for Mood & Anxiety   estradiol (ESTRACE) 0.5 MG tablet Take 0.5 mg by mouth daily.   famotidine (PEPCID) 20 MG tablet Take 1 tablet (20 mg total) by mouth 2 (two) times daily.   fluticasone (FLONASE) 50 MCG/ACT nasal spray SPRAY 2 SPRAYS INTO EACH NOSTRIL EVERY DAY   furosemide (LASIX) 40 MG tablet TAKE 1 TABLET EVERY DAY FOR BLOOD PRESSURE AND FLUID   hydrocortisone 2.5 % cream APPLY DAILY TO SKIN TO AFFECTED AREA TWICE A  DAY FOR 2 WEEKS   medroxyPROGESTERone (PROVERA) 2.5 MG tablet Take 2.5 mg by mouth daily.   metoprolol succinate (TOPROL-XL) 25 MG 24 hr tablet TAKE 1/2 TABLET BY MOUTH EVERY DAY   montelukast (SINGULAIR) 10 MG tablet TAKE 1 TABLET BY MOUTH DAILY FOR ALLERGIES   olmesartan (BENICAR) 20 MG tablet TAKE 1 TABLET BY MOUTH DAILY.   Omega-3 Fatty Acids (FISH OIL PO) Take 1 capsule by mouth daily.   potassium chloride (KLOR-CON) 10 MEQ tablet Take 1 tablet (10 mEq total) by mouth 2 (two) times daily.   rosuvastatin (CRESTOR) 10 MG tablet Take 1 tablet (10 mg total) by mouth daily.   topiramate (TOPAMAX) 50 MG tablet Take 1 tablet  2 x /day  at Suppertime & Bedtime  for Dieting & Weight Loss   valACYclovir (VALTREX) 500 MG tablet Take  1 tablet  2 x /day as needed for Viral Suppression   diphenoxylate-atropine (LOMOTIL)  2.5-0.025 MG tablet Take 1 tablet by mouth 4 (four) times daily as needed for diarrhea or loose stools. (Patient not taking: Reported on 04/30/2022)   esomeprazole (NEXIUM) 40 MG capsule Take 1 capsule (40 mg total) by mouth every other day. Taper off after 2 weeks of every other day. Start Pepcid 20 mg twice a day (Patient not taking: Reported on 04/30/2022)   hyoscyamine (LEVSIN SL) 0.125 MG SL tablet Place 1 tablet (0.125 mg total) under the tongue every 6 (six) hours as needed. (Patient not taking: Reported on 04/30/2022)   No current facility-administered medications on file prior to visit.    ROS: all negative except what is noted in the HPI.   Physical Exam:  BP 110/62   Pulse (!) 56   Temp (!) 97.5 F (36.4 C)   Ht 5' (1.524 m)   Wt 136 lb 12.8 oz (62.1 kg)   SpO2 96%   BMI 26.72 kg/m   General Appearance: NAD.  Awake, conversant and cooperative. Eyes: PERRLA, EOMs intact.  Sclera white.  Conjunctiva without erythema. Sinuses: No frontal/maxillary tenderness.  No nasal discharge. Nares patent.  ENT/Mouth: Ext aud canals clear.  Bilateral TMs w/DOL and without erythema or bulging. Hearing intact.  Posterior pharynx without swelling or exudate.  Tonsils without swelling or erythema.  Neck: Supple.  No masses, nodules or thyromegaly. Respiratory: Effort is regular with non-labored breathing. Breath sounds are equal bilaterally without rales, rhonchi, wheezing or stridor.  Cardio: RRR with no MRGs. Brisk peripheral pulses without edema.  Abdomen: Active BS in all four quadrants.  Soft and non-tender without guarding, rebound tenderness, hernias or masses. Lymphatics: Non tender without lymphadenopathy.  Musculoskeletal:Full ROM, 5/5 strength, normal ambulation.  Skin: Appropriate color for ethnicity. Warm without rashes, lesions, ecchymosis, ulcers.  Neuro: CN II-XII grossly normal. Normal muscle tone without cerebellar symptoms and intact sensation.   Psych: AO X 3,  appropriate  mood and affect, insight and judgment.     Adela Glimpse, NP 11:02 AM [facility]

## 2022-05-17 ENCOUNTER — Other Ambulatory Visit: Payer: Self-pay | Admitting: Cardiovascular Disease

## 2022-05-19 NOTE — Telephone Encounter (Signed)
Patient's 3rd attempt to schedule appt with Dr. Elease Hashimoto, please advise. Thank you!

## 2022-05-27 ENCOUNTER — Other Ambulatory Visit: Payer: Self-pay | Admitting: Cardiovascular Disease

## 2022-06-06 ENCOUNTER — Other Ambulatory Visit: Payer: Self-pay | Admitting: Cardiovascular Disease

## 2022-06-17 ENCOUNTER — Encounter: Payer: Self-pay | Admitting: Internal Medicine

## 2022-06-17 ENCOUNTER — Other Ambulatory Visit: Payer: Self-pay | Admitting: Cardiovascular Disease

## 2022-06-18 ENCOUNTER — Encounter: Payer: Self-pay | Admitting: Gastroenterology

## 2022-06-18 ENCOUNTER — Ambulatory Visit: Payer: BC Managed Care – PPO | Admitting: Gastroenterology

## 2022-06-18 VITALS — BP 100/60 | HR 64 | Ht 60.0 in | Wt 136.2 lb

## 2022-06-18 DIAGNOSIS — M549 Dorsalgia, unspecified: Secondary | ICD-10-CM

## 2022-06-18 DIAGNOSIS — K219 Gastro-esophageal reflux disease without esophagitis: Secondary | ICD-10-CM

## 2022-06-18 DIAGNOSIS — K52832 Lymphocytic colitis: Secondary | ICD-10-CM | POA: Diagnosis not present

## 2022-06-18 DIAGNOSIS — G8929 Other chronic pain: Secondary | ICD-10-CM | POA: Diagnosis not present

## 2022-06-18 MED ORDER — METOPROLOL SUCCINATE ER 25 MG PO TB24: 12.5000 mg | ORAL_TABLET | Freq: Every day | ORAL | 0 refills | Status: AC

## 2022-06-18 MED ORDER — POTASSIUM CHLORIDE ER 10 MEQ PO TBCR: 10.0000 meq | EXTENDED_RELEASE_TABLET | Freq: Two times a day (BID) | ORAL | 0 refills | Status: DC

## 2022-06-18 MED ORDER — LOPERAMIDE HCL 2 MG PO TABS: 2.0000 mg | ORAL_TABLET | Freq: Every day | ORAL | 0 refills | Status: DC

## 2022-06-18 NOTE — Progress Notes (Signed)
HPI :  62 year old female here for follow-up for  lymphocytic colitis.   Recall she was seen in October 2023.  She had been persistent loose stools over months associated with urgency, weight loss, incontinence.  GI pathogen panel was negative.  Eventually led to colonoscopy with me in November 23.  No concerning findings on overt exam however biopsies were positive for lymphocytic colitis.  She was tested for celiac disease with serologies which were negative.   She was treated initially with a course of budesonide.  I had also recommended she come off Nexium as this potentially could have caused it, as all well as avoiding all NSAIDs other than her aspirin.  She has been on baby aspirin for history of TIA in the past.  I saw her at the end of January and she felt much better following budesonide and her symptoms had normalized.  Her bowels became regular.  She had not realize she was supposed to taper off Nexium and she had done that after that visit.  We elected to monitor off Nexium and see if this recurred again.  Unfortunately in April she had recurrence of symptoms with loose stools with urgency which was really impacting her quality of life.  She was having multiple bowel movements per day.  Previously on budesonide she had only 1 bowel movement per day that was formed.  We called in a course of budesonide again for her which she started and took.  She states her symptoms completely resolved on the budesonide again.  She again has been off of Nexium/PPIs for reflux, using Pepcid which is working okay.  Avoiding all other NSAIDs.  She has not really tried routine use of Pepto-Bismol or Imodium.  She was concerned about developing constipation on that regimen.  She has been on a probiotic and inquires if she should continue that.  She otherwise has been having some back pain in her left mid back.  This is been ongoing for several months now.  She had a CT scan in December which did not show  anything concerning.  She had an ultrasound 2015 which showed her gallbladder is normal.  She is frustrated about persistent pain in her back.  Seems to be the most of the time.   Prior workup: Colonoscopy 11/28/21: The perianal and digital rectal examinations were normal.                           The terminal ileum appeared normal.                           Scattered small-mouthed diverticula were found in                            the ascending colon and left colon.                           Internal hemorrhoids were found during                            retroflexion. The hemorrhoids were small.                           The exam was otherwise without abnormality.  Biopsies for histology were taken with a cold                            forceps from the right colon, left colon and                            transverse colon for evaluation of microscopic                            colitis.     Surgical [P], colon nos, random sites - LYMPHOCYTIC COLITIS - SEE NOTE   Labs done to rule out celiac - negative     CT abdomen / pelvis 12/15/21: IMPRESSION: 1. No inflammation of the small bowel or colon. 2. No inflammation or infection in the abdomen pelvis. 3. No abdominal hernia.    Past Medical History:  Diagnosis Date   Allergy    Anxiety    Asthma    CAD (coronary artery disease)    Cardiomyopathy (HCC)    a. Echo 8/16:  EF 40-45%, apical and ant-septal HK, Gr 2 DD   Diverticula, colon    Family history of adverse reaction to anesthesia    sisiter had PONV   GAD (generalized anxiety disorder)    Headache    rare migraines   History of cardiovascular stress test    Myoview 8/16:  EF 48%, anterior and apical defect c/w breast atten, no ischemia; Low Risk // Nuclear stress test 9/18: Low risk stress nuclear study with fixed septal defect consistent with LBBB vs prior infarct; no ischemia; EF 53 with septal akinesis.   Hyperlipidemia     Hypertension    Irregular periods/menstrual cycles    LBBB (left bundle branch block)    Melanoma (HCC)    right arm   Seasonal allergies    TIA (transient ischemic attack)    Torn meniscus    Vitamin B12 deficiency    Vitamin D deficiency      Past Surgical History:  Procedure Laterality Date   ABLATION     BREAST REDUCTION SURGERY     CARDIAC CATHETERIZATION     CESAREAN SECTION     x2   KNEE SURGERY     partial knee replacement right   MELANOMA EXCISION WITH SENTINEL LYMPH NODE BIOPSY Right 09/15/2017   Procedure: BIOPSY RIGHT SHOULDER MOLE AND WIDE LOCAL EXCISION OF RIGHT ARM MELANOMA, ADVANCEMENT FLAP CLOSURE, SENTINEL LYMPH NODE BIOPSY  RIGHT ARM MELANOMA;  Surgeon: Almond Lint, MD;  Location: [facility];  Service: General;  Laterality: Right;   REDUCTION MAMMAPLASTY Bilateral    REPAIR QUADRICEPS / HAMSTRING MUSCLE     REPLACEMENT TOTAL KNEE Left 09/20/2018   Dr[facility] Simeon [facility] at [facility]   TONSILLECTOMY     tubes in ears  10/12   Family History  Problem Relation Age of Onset   Diabetes Mother    Heart disease Mother    Hypertension Mother    Heart attack Mother 31   Other Father        MVA   Asthma Sister    Asthma Son    Cancer Maternal Grandmother        Uterine   Breast cancer Maternal Grandmother    Heart attack Paternal Grandmother    Colon cancer Neg Hx    Colon polyps Neg Hx  Esophageal cancer Neg Hx    Stomach cancer Neg Hx    Rectal cancer Neg Hx    Social History   Tobacco Use   Smoking status: Never   Smokeless tobacco: Never  Vaping Use   Vaping Use: Never used  Substance Use Topics   Alcohol use: Yes    Comment: 1-2 glasses wine nightly   Drug use: No   Current Outpatient Medications  Medication Sig Dispense Refill   acyclovir ointment (ZOVIRAX) 5 % Apply 1 application topically every 3 (three) hours. 16 g 1   Adapalene-Benzoyl Peroxide (EPIDUO) 0.1-2.5 % gel Apply 1 application topically as needed (acne). 45 g 0    albuterol (VENTOLIN HFA) 108 (90 Base) MCG/ACT inhaler INHALE 2 PUFFS INTO LUNGS EVERY 4 HOURS AS NEEDED FOR WHEEZING OR SHORTNESS OF BREATH     amLODipine (NORVASC) 5 MG tablet TAKE 1 TABLET BY MOUTH EVERY DAY FOR BLOOD PRESSURE 90 tablet 3   aspirin EC 81 MG tablet Take 1 tablet (81 mg total) by mouth daily.     budesonide (ENTOCORT EC) 3 MG 24 hr capsule Take 3 capsules (9 mg total) by mouth daily for 42 days, THEN 2 capsules (6 mg total) daily for 14 days, THEN 1 capsule (3 mg total) daily for 14 days. 168 capsule 0   Cholecalciferol (VITAMIN D PO) Take 5,000 Units by mouth daily.      escitalopram (LEXAPRO) 20 MG tablet Take 1 tablet Daily for Mood & Anxiety 90 tablet 1   esomeprazole (NEXIUM) 40 MG capsule Take 1 capsule (40 mg total) by mouth every other day. Taper off after 2 weeks of every other day. Start Pepcid 20 mg twice a day 90 capsule 3   estradiol (ESTRACE) 0.5 MG tablet Take 0.5 mg by mouth daily.     famotidine (PEPCID) 20 MG tablet Take 1 tablet (20 mg total) by mouth 2 (two) times daily. 180 tablet 1   fluticasone (FLONASE) 50 MCG/ACT nasal spray SPRAY 2 SPRAYS INTO EACH NOSTRIL EVERY DAY 48 mL 1   furosemide (LASIX) 40 MG tablet TAKE 1 TABLET EVERY DAY FOR BLOOD PRESSURE AND FLUID 90 tablet 3   hydrocortisone 2.5 % cream APPLY DAILY TO SKIN TO AFFECTED AREA TWICE A DAY FOR 2 WEEKS     medroxyPROGESTERone (PROVERA) 2.5 MG tablet Take 2.5 mg by mouth daily.     metoprolol succinate (TOPROL-XL) 25 MG 24 hr tablet Take 0.5 tablets (12.5 mg total) by mouth daily. 8 tablet 0   olmesartan (BENICAR) 20 MG tablet TAKE 1 TABLET BY MOUTH DAILY. 90 tablet 1   Omega-3 Fatty Acids (FISH OIL PO) Take 1 capsule by mouth daily.     potassium chloride (KLOR-CON) 10 MEQ tablet Take 1 tablet (10 mEq total) by mouth 2 (two) times daily. 30 tablet 0   rosuvastatin (CRESTOR) 10 MG tablet TAKE 1 TABLET BY MOUTH EVERY DAY 30 tablet 0   topiramate (TOPAMAX) 50 MG tablet Take 1 tablet  2 x /day  at  Suppertime & Bedtime  for Dieting & Weight Loss 180 tablet 3   valACYclovir (VALTREX) 500 MG tablet Take  1 tablet  2 x /day as needed for Viral Suppression 180 tablet 2   No current facility-administered medications for this visit.   Allergies  Allergen Reactions   Metoprolol     Not allergy, but intolerance - had bradycardia with HR in the 40s even with low dose   Oxycodone Itching    Itching:  Trunk, palms of hands, legs, back       Review of Systems: All systems reviewed and negative except where noted in HPI.   Lab Results  Component Value Date   WBC 10.5 01/14/2022   HGB 14.5 01/14/2022   HCT 42.8 01/14/2022   MCV 90.9 01/14/2022   PLT 319 01/14/2022    Lab Results  Component Value Date   CREATININE 0.85 01/14/2022   BUN 18 01/14/2022   NA 138 01/14/2022   K 3.9 01/14/2022   CL 104 01/14/2022   CO2 26 01/14/2022    Lab Results  Component Value Date   ALT 17 01/14/2022   AST 16 01/14/2022   ALKPHOS 96 06/11/2020   BILITOT 0.3 01/14/2022     Physical Exam: BP 100/60 (BP Location: Left Arm, Patient Position: Sitting, Cuff Size: Normal)   Pulse 64   Ht 5' (1.524 m)   Wt 136 lb 4 oz (61.8 kg)   BMI 26.61 kg/m  Constitutional: Pleasant,well-developed, female in no acute distress. Neurological: Alert and oriented to person place and time. Psychiatric: Normal mood and affect. Behavior is normal.   ASSESSMENT: 62 y.o. female here for assessment of the following  1. Lymphocytic colitis   2. Gastroesophageal reflux disease, unspecified whether esophagitis present   3. Chronic left-sided back pain, unspecified back location    As above, history of lymphocytic colitis diagnosed in 2023, responded quite well to budesonide.  Had thought perhaps Nexium was the trigger for this and we stopped it however she unfortunately developed recurrent symptoms in April.  Very responsive to budesonide again and symptoms have resolved, now here to discuss long-term plan.  She  is avoiding NSAIDs otherwise.  I do not see any other medications on her med list that could do this other than baby aspirin and she is to take that for TIA.  We discussed options for therapy of her lymphocytic colitis moving forward to include scheduled Imodium/Lomotil, Pepto-Bismol, Colestid/cholestyramine, chronic low-dose budesonide, versus biologic therapy.  She has not tried scheduled Imodium yet and her symptoms are rather mild right now.  Recommend Imodium once daily and titrate up or down as needed.  If this does not help would transition her to Colestid/cholestyramine a few times daily and see if that works.  If she feels that we will need to discuss if she wants to go on low-dose budesonide versus consideration of biologic therapy, hopefully that is not needed.  She will see how things go and keep you posted pending her course.  Otherwise, her current symptoms of pain primarily focused in her left mid back.  She does not have any typical biliary colic, I think gastric or gallbladder pathology is less likely.  I will refer her to sports medicine for chronic left back pain for their evaluation.  She agrees.   PLAN: - immodium daily and titrate up as needed - if that does not work will try colestid / cholestyramine - discussed other options including budesonide low dose or biologic, hopefully not needed - avoiding PPI and NSAIDs but unfortunately has not helped, continuing aspirin - stop probiotics - refer to sports medicine for chronic back pain - seems unlikely biliary colic or upper tract, has localized to mid left back, not prandial  Harlin Rain, MD [facility]

## 2022-06-18 NOTE — Patient Instructions (Signed)
Please purchase the following medications over the counter and take as directed:  Imodium: Take daily and titrate as needed  We are referring you to [facility].  They will contact you directly to schedule an appointment.  It may take a week or more before you hear from them.  Please feel free to contact us if you have not heard from them within 2 weeks and we will follow up on the referral.   Thank you for entrusting me with your care and for choosing [facility], Dr. Ileene Patrick    If your blood pressure at your visit was 140/90 or greater, please contact your primary care physician to follow up on this. ______________________________________________________  If you are age 59 or older, your body mass index should be between 23-30. Your Body mass index is 26.61 kg/m. If this is out of the aforementioned range listed, please consider follow up with your Primary Care Provider.  If you are age 40 or younger, your body mass index should be between 19-25. Your Body mass index is 26.61 kg/m. If this is out of the aformentioned range listed, please consider follow up with your Primary Care Provider.  ________________________________________________________  The [facility] providers would like to encourage you to use [facility] to communicate with providers for non-urgent requests or questions.  Due to long hold times on the telephone, sending your provider a message by [facility] may be a faster and more efficient way to get a response.  Please allow 48 business hours for a response.  Please remember that this is for non-urgent requests.  _______________________________________________________  Due to recent changes in healthcare laws, you may see the results of your imaging and laboratory studies on MyChart before your provider has had a chance to review them.  We understand that in some cases there may be results that are confusing or concerning to you. Not all laboratory  results come back in the same time frame and the provider may be waiting for multiple results in order to interpret others.  Please give [place] 48 hours in order for your provider to thoroughly review all the results before contacting the office for clarification of your results.

## 2022-06-25 DIAGNOSIS — M9901 Segmental and somatic dysfunction of cervical region: Secondary | ICD-10-CM | POA: Diagnosis not present

## 2022-06-27 ENCOUNTER — Other Ambulatory Visit: Payer: Self-pay | Admitting: Gastroenterology

## 2022-06-27 ENCOUNTER — Other Ambulatory Visit: Payer: Self-pay | Admitting: Cardiovascular Disease

## 2022-06-27 DIAGNOSIS — J45909 Unspecified asthma, uncomplicated: Secondary | ICD-10-CM | POA: Diagnosis not present

## 2022-06-27 DIAGNOSIS — R051 Acute cough: Secondary | ICD-10-CM | POA: Diagnosis not present

## 2022-06-29 ENCOUNTER — Telehealth: Payer: Self-pay | Admitting: Nurse Practitioner

## 2022-06-29 ENCOUNTER — Other Ambulatory Visit: Payer: Self-pay | Admitting: Nurse Practitioner

## 2022-06-29 NOTE — Telephone Encounter (Signed)
Pt. Wants a portable nebulizer. She said her insurance covers anything. If this does happen, could you send to [facility] off [address].

## 2022-07-02 ENCOUNTER — Ambulatory Visit: Payer: BC Managed Care – PPO | Admitting: Nurse Practitioner

## 2022-07-02 ENCOUNTER — Encounter: Payer: Self-pay | Admitting: Nurse Practitioner

## 2022-07-02 VITALS — BP 128/78 | HR 58 | Temp 97.4°F | Ht 60.0 in | Wt 136.6 lb

## 2022-07-02 DIAGNOSIS — R051 Acute cough: Secondary | ICD-10-CM | POA: Diagnosis not present

## 2022-07-02 DIAGNOSIS — R062 Wheezing: Secondary | ICD-10-CM

## 2022-07-02 DIAGNOSIS — J069 Acute upper respiratory infection, unspecified: Secondary | ICD-10-CM | POA: Diagnosis not present

## 2022-07-02 DIAGNOSIS — J45909 Unspecified asthma, uncomplicated: Secondary | ICD-10-CM | POA: Diagnosis not present

## 2022-07-02 MED ORDER — IPRATROPIUM-ALBUTEROL 0.5-2.5 (3) MG/3ML IN SOLN: 3.0000 mL | RESPIRATORY_TRACT | 2 refills | Status: DC | PRN

## 2022-07-02 MED ORDER — PREDNISONE 10 MG PO TABS
ORAL_TABLET | ORAL | 0 refills | Status: DC
Start: 2022-07-02 — End: 2022-07-06

## 2022-07-02 MED ORDER — AZITHROMYCIN 250 MG PO TABS
ORAL_TABLET | ORAL | 1 refills | Status: DC
Start: 2022-07-02 — End: 2022-07-06

## 2022-07-02 MED ORDER — IPRATROPIUM-ALBUTEROL 0.5-2.5 (3) MG/3ML IN SOLN: 3.0000 mL | Freq: Once | RESPIRATORY_TRACT | Status: DC

## 2022-07-02 MED ORDER — TRELEGY ELLIPTA 200-62.5-25 MCG/ACT IN AEPB
1.0000 | INHALATION_SPRAY | Freq: Every day | RESPIRATORY_TRACT | 2 refills | Status: DC
Start: 2022-07-02 — End: 2022-09-04

## 2022-07-02 MED ORDER — MONTELUKAST SODIUM 10 MG PO TABS: ORAL_TABLET | ORAL | 3 refills | Status: DC

## 2022-07-02 MED ORDER — IPRATROPIUM-ALBUTEROL 0.5-2.5 (3) MG/3ML IN SOLN
3.0000 mL | Freq: Once | RESPIRATORY_TRACT | Status: DC
Start: 2022-07-02 — End: 2022-08-17

## 2022-07-02 NOTE — Progress Notes (Signed)
Assessment and Plan:  Gail Bailey was seen today for a episodic visit .  Diagnoses and all order for this visit:  Uncomplicated asthma, unspecified asthma severity, unspecified whether persistent Restart Singular Continue Albuterol Restart Trelegy Duoneb administered - patient tolerated well New home nebulizer ordered Report to ER for any increase in difficulty breathing.   - ipratropium-albuterol (DUONEB) 0.5-2.5 (3) MG/3ML nebulizer solution 3 mL - For home use only DME Nebulizer machine - ipratropium-albuterol (DUONEB) 0.5-2.5 (3) MG/3ML SOLN; Take 3 mLs by nebulization every 4 (four) hours as needed.  Dispense: 90 mL; Refill: 2 - montelukast (SINGULAIR) 10 MG tablet; Take 1 tablet daily for Allergies & Asthma  Dispense: 90 tablet; Refill: 3 - Fluticasone-Umeclidin-Vilant (TRELEGY ELLIPTA) 200-62.5-25 MCG/ACT AEPB; Inhale 1 Inhalation into the lungs daily.  Dispense: 28 each; Refill: 2  Upper respiratory tract infection, unspecified type Start tmt with Z-Pak Continue steroid Stay well hydrated to keep mucus thin and productive.  - azithromycin (ZITHROMAX) 250 MG tablet; Take 2 tablets on  Day 1,  followed by 1 tablet  daily for 4 more days    for Sinusitis  /Bronchitis  Dispense: 6 each; Refill: 1  Acute cough  - ipratropium-albuterol (DUONEB) 0.5-2.5 (3) MG/3ML nebulizer solution 3 mL - ipratropium-albuterol (DUONEB) 0.5-2.5 (3) MG/3ML SOLN; Take 3 mLs by nebulization every 4 (four) hours as needed.  Dispense: 90 mL; Refill: 2  Wheezing  - ipratropium-albuterol (DUONEB) 0.5-2.5 (3) MG/3ML nebulizer solution 3 mL - ipratropium-albuterol (DUONEB) 0.5-2.5 (3) MG/3ML SOLN; Take 3 mLs by nebulization every 4 (four) hours as needed.  Dispense: 90 mL; Refill: 2 - predniSONE (DELTASONE) 10 MG tablet; 1 tab 3 x day for 2 days, then 1 tab 2 x day for 2 days, then 1 tab 1 x day for 3 days  Dispense: 13 tablet; Refill: 0  Report to ER or call 911 for any increase in difficulty  breathing.  Notify office for further evaluation and treatment, questions or concerns if s/s fail to improve. The risks and benefits of my recommendations, as well as other treatment options were discussed with the patient today. Questions were answered.  Further disposition pending results of labs. Discussed med's effects and SE's.    Over 20 minutes of exam, counseling, chart review, and critical decision making was performed.   Future Appointments  Date Time Provider Department Center  08/04/2022  2:15 PM Judi Saa, DO [facility] None  08/18/2022 10:30 AM Lucky Cowboy, MD [facility] None  01/15/2023 11:00 AM Lucky Cowboy, MD [facility] None    ------------------------------------------------------------------------------------------------------------------   HPI BP 128/78   Pulse (!) 58   Temp (!) 97.4 F (36.3 C)   Ht 5' (1.524 m)   Wt 136 lb 9.6 oz (62 kg)   SpO2 99%   BMI 26.68 kg/m    Patient complains of symptoms of a URI, possible sinusitis. Symptoms include congestion, cough described as productive, nasal congestion, shortness of breath, wheezing, and chest tightness . Onset of symptoms was 8 days ago, and has been unchanged since that time. Treatment to date: antibiotics, antihistamines, cough suppressants, decongestants, and nasal steroids.  She was seen at [facility] 6 days ago.    Past Medical History:  Diagnosis Date   Allergy    Anxiety    Asthma    CAD (coronary artery disease)    Cardiomyopathy (HCC)    a. Echo 8/16:  EF 40-45%, apical and ant-septal HK, Gr 2 DD   Diverticula, colon    Family history of  adverse reaction to anesthesia    sisiter had PONV   GAD (generalized anxiety disorder)    Headache    rare migraines   History of cardiovascular stress test    Myoview 8/16:  EF 48%, anterior and apical defect c/w breast atten, no ischemia; Low Risk // Nuclear stress test 9/18: Low risk stress nuclear study with fixed septal defect consistent with LBBB  vs prior infarct; no ischemia; EF 53 with septal akinesis.   Hyperlipidemia    Hypertension    Irregular periods/menstrual cycles    LBBB (left bundle branch block)    Melanoma (HCC)    right arm   Seasonal allergies    TIA (transient ischemic attack)    Torn meniscus    Vitamin B12 deficiency    Vitamin D deficiency      Allergies  Allergen Reactions   Metoprolol     Not allergy, but intolerance - had bradycardia with HR in the 40s even with low dose   Oxycodone Itching    Itching: Trunk, palms of hands, legs, back      Current Outpatient Medications on File Prior to Visit  Medication Sig   acyclovir ointment (ZOVIRAX) 5 % Apply 1 application topically every 3 (three) hours.   Adapalene-Benzoyl Peroxide (EPIDUO) 0.1-2.5 % gel Apply 1 application topically as needed (acne).   albuterol (VENTOLIN HFA) 108 (90 Base) MCG/ACT inhaler INHALE 2 PUFFS INTO LUNGS EVERY 4 HOURS AS NEEDED FOR WHEEZING OR SHORTNESS OF BREATH   amLODipine (NORVASC) 5 MG tablet TAKE 1 TABLET BY MOUTH EVERY DAY FOR BLOOD PRESSURE   aspirin EC 81 MG tablet Take 1 tablet (81 mg total) by mouth daily.   Cholecalciferol (VITAMIN D PO) Take 5,000 Units by mouth daily.    escitalopram (LEXAPRO) 20 MG tablet Take 1 tablet Daily for Mood & Anxiety   esomeprazole (NEXIUM) 40 MG capsule Take 1 capsule (40 mg total) by mouth every other day. Taper off after 2 weeks of every other day. Start Pepcid 20 mg twice a day   estradiol (ESTRACE) 0.5 MG tablet Take 0.5 mg by mouth daily.   famotidine (PEPCID) 20 MG tablet TAKE 1 TABLET BY MOUTH TWICE A DAY   fluticasone (FLONASE) 50 MCG/ACT nasal spray SPRAY 2 SPRAYS INTO EACH NOSTRIL EVERY DAY   furosemide (LASIX) 40 MG tablet TAKE 1 TABLET EVERY DAY FOR BLOOD PRESSURE AND FLUID   hydrocortisone 2.5 % cream APPLY DAILY TO SKIN TO AFFECTED AREA TWICE A DAY FOR 2 WEEKS   loperamide (IMODIUM A-D) 2 MG tablet Take 1 tablet (2 mg total) by mouth daily. Titrate as needed    medroxyPROGESTERone (PROVERA) 2.5 MG tablet Take 2.5 mg by mouth daily.   metoprolol succinate (TOPROL-XL) 25 MG 24 hr tablet Take 0.5 tablets (12.5 mg total) by mouth daily.   olmesartan (BENICAR) 20 MG tablet TAKE 1 TABLET BY MOUTH DAILY.   Omega-3 Fatty Acids (FISH OIL PO) Take 1 capsule by mouth daily.   potassium chloride (KLOR-CON) 10 MEQ tablet Take 1 tablet (10 mEq total) by mouth 2 (two) times daily.   rosuvastatin (CRESTOR) 10 MG tablet TAKE 1 TABLET BY MOUTH EVERY DAY   topiramate (TOPAMAX) 50 MG tablet Take 1 tablet  2 x /day  at Suppertime & Bedtime  for Dieting & Weight Loss   valACYclovir (VALTREX) 500 MG tablet Take  1 tablet  2 x /day as needed for Viral Suppression   No current facility-administered medications on file prior to  visit.    ROS: all negative except what is noted in the HPI.   Physical Exam:  BP 128/78   Pulse (!) 58   Temp (!) 97.4 F (36.3 C)   Ht 5' (1.524 m)   Wt 136 lb 9.6 oz (62 kg)   SpO2 99%   BMI 26.68 kg/m   General Appearance: NAD.  Awake, conversant and cooperative. Eyes: PERRLA, EOMs intact.  Sclera white.  Conjunctiva without erythema. Sinuses: Frontal/maxillary tenderness.  No nasal discharge. Nares patent.  ENT/Mouth: Ext aud canals clear.  Bilateral TMs w/DOL and without erythema or bulging. Hearing intact.  Posterior pharynx without swelling or exudate.  Tonsils without swelling or erythema.  Neck: Supple.  No masses, nodules or thyromegaly. Respiratory: Effort is regular with non-labored breathing. Breath sounds are equal bilaterally with scattered wheezing through all lung fields during inspiration and expiration.   Cardio: RRR with no MRGs. Brisk peripheral pulses without edema.  Abdomen: Active BS in all four quadrants.  Soft and non-tender without guarding, rebound tenderness, hernias or masses. Lymphatics: Non tender without lymphadenopathy.  Musculoskeletal: Full ROM, 5/5 strength, normal ambulation.  No clubbing or  cyanosis. Skin: Appropriate color for ethnicity. Warm without rashes, lesions, ecchymosis, ulcers.  Neuro: CN II-XII grossly normal. Normal muscle tone without cerebellar symptoms and intact sensation.   Psych: AO X 3,  appropriate mood and affect, insight and judgment.     Adela Glimpse, NP 11:25 AM [facility]

## 2022-07-02 NOTE — Patient Instructions (Signed)
Asthma Action Plan, Adult An asthma action plan helps you understand how to manage your asthma and what to do when you have an asthma attack. The action plan is a color-coded plan that lists the symptoms that indicate whether or not your condition is under control and what actions to take. If you have symptoms in the green zone, you are doing well. If you have symptoms in the yellow zone, you are having problems. If you have symptoms in the red zone, you need medical care right away. Follow the plan that you and your health care provider develop. Review your plan with your health care provider at each visit. What triggers your asthma? Knowing the things that can trigger an asthma attack or make your asthma symptoms worse is very important. Talk to your health care provider about your asthma triggers and how to avoid them. Record your known asthma triggers here: _______________ What is your personal best peak flow reading? If you use a peak flow meter, determine your personal best reading. Record it here: _______________ Green zone This zone means that your asthma is under control. You may not have any symptoms while you are in the green zone. This means that you: Have no coughing or wheezing, even while you are working or playing. Sleep through the night. Are breathing well. Have a peak flow reading that is above __________ (80% of your personal best or greater). If you are in the green zone, continue to manage your asthma as directed. Take these medicines every day: Controller medicine and dosage: _______________ Controller medicine and dosage: _______________ Controller medicine and dosage: _______________ Controller medicine and dosage: _______________ Before exercise, use this reliever or rescue medicine: _______________ Call your health care provider if you are using a reliever or rescue medicine more than 2-3 times a week. Yellow zone Symptoms in this zone mean that your condition may  be getting worse. You may have symptoms that interfere with exercise, are noticeably worse after exposure to triggers, or are worse at the first sign of a cold (upper respiratory infection). These may include: Waking from sleep. Coughing, especially at night or first thing in the morning. Mild wheezing. Chest tightness. A peak flow reading that is __________ to __________ (50-79% of your personal best). If you have any of these symptoms: Add the following medicine to the ones that you use daily: Reliever or rescue medicine and dosage: _______________ Additional medicine and dosage: _______________ Call your health care provider if: You remain in the yellow zone for __________ hours. You are using a reliever or rescue medicine more than 2-3 times a week. Red zone Symptoms in this zone mean that you should get medical help right away. You will likely feel distressed and have symptoms at rest that restrict your activity. You are in the red zone if: You are breathing hard and quickly. Your nose opens wide, your ribs show, and your neck muscles become visible when you breathe in. Your lips, fingers, or toes are a bluish color. You have trouble speaking in full sentences. Your peak flow reading is less than __________ (less than 50% of your personal best). Your symptoms do not improve within 15-20 minutes after you use your reliever or rescue medicine (bronchodilator). If you have any of these symptoms: These symptoms represent a serious problem that is an emergency. Do not wait to see if the symptoms will go away. Get medical help right away. Call your local emergency services (911 in the [place] Do not drive yourself   to the hospital. Use your reliever or rescue medicine. Start a nebulizer treatment or take 2-4 puffs from a metered-dose inhaler with a spacer. Repeat this action every 15-20 minutes until help arrives. Where to find more information You can find more information about asthma  from: Centers for Disease Control and Prevention: www.cdc.gov American Lung Association: www.lung.org This information is not intended to replace advice given to you by your health care provider. Make sure you discuss any questions you have with your health care provider. Document Revised: 02/22/2020 Document Reviewed: 02/22/2020 Elsevier Patient Education  2024 [employer]  

## 2022-07-06 ENCOUNTER — Ambulatory Visit
Admission: RE | Admit: 2022-07-06 | Discharge: 2022-07-06 | Disposition: A | Discharge status: Home / Self Care | Payer: BC Managed Care – PPO | Source: Ambulatory Visit | Attending: Nurse Practitioner | Admitting: Nurse Practitioner

## 2022-07-06 ENCOUNTER — Encounter: Payer: Self-pay | Admitting: Nurse Practitioner

## 2022-07-06 ENCOUNTER — Ambulatory Visit (INDEPENDENT_AMBULATORY_CARE_PROVIDER_SITE_OTHER): Payer: BC Managed Care – PPO | Admitting: Nurse Practitioner

## 2022-07-06 VITALS — BP 110/70 | HR 68 | Temp 97.7°F | Ht 60.0 in | Wt 136.0 lb

## 2022-07-06 DIAGNOSIS — J4521 Mild intermittent asthma with (acute) exacerbation: Secondary | ICD-10-CM

## 2022-07-06 DIAGNOSIS — R051 Acute cough: Secondary | ICD-10-CM

## 2022-07-06 DIAGNOSIS — R0789 Other chest pain: Secondary | ICD-10-CM

## 2022-07-06 DIAGNOSIS — R0602 Shortness of breath: Secondary | ICD-10-CM

## 2022-07-06 DIAGNOSIS — R062 Wheezing: Secondary | ICD-10-CM

## 2022-07-06 DIAGNOSIS — R059 Cough, unspecified: Secondary | ICD-10-CM | POA: Diagnosis not present

## 2022-07-06 DIAGNOSIS — J069 Acute upper respiratory infection, unspecified: Secondary | ICD-10-CM

## 2022-07-06 DIAGNOSIS — J45909 Unspecified asthma, uncomplicated: Secondary | ICD-10-CM | POA: Diagnosis not present

## 2022-07-06 DIAGNOSIS — Z8249 Family history of ischemic heart disease and other diseases of the circulatory system: Secondary | ICD-10-CM

## 2022-07-06 MED ORDER — DEXAMETHASONE 4 MG PO TABS
ORAL_TABLET | ORAL | 0 refills | Status: DC
Start: 2022-07-06 — End: 2022-08-17

## 2022-07-06 MED ORDER — DOXYCYCLINE HYCLATE 100 MG PO TABS
100.0000 mg | ORAL_TABLET | Freq: Two times a day (BID) | ORAL | 0 refills | Status: AC
Start: 2022-07-06 — End: 2022-07-13

## 2022-07-06 NOTE — Patient Instructions (Signed)
Pulmonary Embolism  A pulmonary embolism (PE) is a sudden blockage or decrease of blood flow in one or both lungs that happens when a clot travels into the arteries of the lung (pulmonary arteries). Most blockages come from a blood clot that forms in the vein of a leg or arm (deep vein thrombosis, DVT) and travels to the lungs. A clot is blood that has thickened into a gel or solid. PE is a dangerous and life-threatening condition that needs to be treated right away. What are the causes? This condition is usually caused by a blood clot that forms in a vein and moves to the lungs. In rare cases, it may be caused by air, fat, part of a tumor, or other tissue that moves through the veins and into the lungs. What increases the risk? The following factors may make you more likely to develop this condition: Experiencing a traumatic injury, such as breaking a hip or leg. Having: A spinal cord injury. Major surgery, especially hip or knee replacement, or surgery on parts of the nervous system or on the abdomen. A stroke. A blood-clotting disease. Long-term (chronic) lung or heart disease. Cancer, especially if you are being treated with chemotherapy. A central venous catheter. Taking medicines that contain estrogen. These include birth control pills and hormone replacement therapy. Being: Pregnant. In the period of time after your baby is delivered (postpartum). Older than age 60. Overweight. A smoker, especially if you have other risks. Not very active (sedentary), not being able to move at all, or spending long periods sitting, such as travel over 6 hours. You are also at a greater risk if you have a leg in a cast or splint. What are the signs or symptoms? Symptoms of this condition usually start suddenly and include: Shortness of breath during activity or at rest. Coughing, coughing up blood, or coughing up bloody mucus. Chest pain, back pain, or shoulder blade pain that gets worse with deep  breaths. Rapid or irregular heartbeat. Feeling light-headed or dizzy, or fainting. Feeling anxious. Pain and swelling in a leg. This is a symptom of DVT, which can lead to PE. How is this diagnosed? This condition may be diagnosed based on your medical history, a physical exam, and tests. Tests may include: Blood tests. An ECG (electrocardiogram) of the heart. A CT pulmonary angiogram. This test checks blood flow in and around your lungs. A ventilation-perfusion scan, also called a lung VQ scan. This test measures air flow and blood flow to the lungs. An ultrasound to check for a DVT. How is this treated? Treatment for this condition depends on many factors, such as the cause of your PE, your risk for bleeding or developing more clots, and other medical conditions you may have. Treatment aims to stop blood clots from forming or growing larger. In some cases, treatment may be aimed at breaking apart or removing the blood clot. Treatment may include: Medicines, such as: Blood thinning medicines, also called anticoagulants, to stop clots from forming and growing. Medicines that break apart clots (fibrinolytics). Procedures, such as: Using a flexible tube to remove a blood clot (embolectomy) or to deliver medicine to destroy it (catheter-directed thrombolysis). Surgery to remove the clot (surgical embolectomy). This is rare. You may need a combination of immediate, long-term, and extended treatments. Your treatment may continue for several months (maintenance therapy) or longer depending on your medical conditions. You and your health care provider will work together to choose the treatment program that is best for you.   Follow these instructions at home: Medicines Take over-the-counter and prescription medicines only as told by your health care provider. If you are taking blood thinners: Talk with your health care provider before you take any medicines that contain aspirin or NSAIDs, such as  ibuprofen. These medicines increase your risk for dangerous bleeding. Take your medicine exactly as told, at the same time every day. Avoid activities that could cause injury or bruising, and follow instructions about how to prevent falls. Wear a medical alert bracelet or carry a card that lists what medicines you take. Understand what foods and drugs interact with any medicines that you are taking. General instructions Ask your health care provider when you may return to your normal activities. Avoid sitting or lying for a long time without moving. Maintain a healthy weight. Ask your health care provider what weight is healthy for you. Do not use any products that contain nicotine or tobacco. These products include cigarettes, chewing tobacco, and vaping devices, such as e-cigarettes. If you need help quitting, ask your health care provider. Talk with your health care provider about any travel plans. It is important to make sure that you are still able to take your medicine while traveling. Keep all follow-up visits. This is important. Where to find more information American Lung Association: www.lung.org Centers for Disease Control and Prevention: www.cdc.gov Contact a health care provider if: You missed a dose of your blood thinner medicine. You have a fever. Get help right away if: You have: New or increased pain, swelling, warmth, or redness in an arm or leg. Shortness of breath that gets worse during activity or at rest. Worsening chest pain. A rapid or irregular heartbeat. A severe headache. Vision changes. A serious fall or accident, or you hit your head. Blood in your vomit, stool, or urine. A cut that will not stop bleeding. You cough up blood. You feel light-headed or dizzy, and that feeling does not go away. You cannot move your arms or legs. You are confused or have memory loss. These symptoms may represent a serious problem that is an emergency. Do not wait to see if the  symptoms will go away. Get medical help right away. Call your local emergency services (911 in the [place] Do not drive yourself to the hospital. Summary A pulmonary embolism (PE) is a serious and potentially life-threatening condition. It happens when a blood clot from one part of the body travels to the arteries of the lung, causing a sudden blockage or decrease of blood flow to the lungs. This may result in shortness of breath, chest pain, dizziness, and fainting. Treatments for this condition usually include medicines to thin your blood (anticoagulants) or medicines to break apart blood clots. If you are given blood thinners, take your medicine exactly as told by your health care provider, at the same time every day. This is important. Understand what foods and drugs interact with any medicines that you are taking. If you have signs of PE or DVT, call your local emergency services (911 in the [place] This information is not intended to replace advice given to you by your health care provider. Make sure you discuss any questions you have with your health care provider. Document Revised: 12/01/2019 Document Reviewed: 12/01/2019 Elsevier Patient Education  2024 [employer].  

## 2022-07-06 NOTE — Progress Notes (Addendum)
Assessment and Plan:  Gail Bailey was seen today for a episodic visit .  Diagnoses and all order for this visit:  Mild intermittent asthma. Will order DME in home nebulizer with duo-neb solution. Continue Singular Continue Albuterol Continue Trelegy Home nebulizer PRN Report to ER for any increase in difficulty breathing.   Upper respiratory tract infection, unspecified type Finished Z-Pak without effectiveness. Will treat with Doxycycline and assess with CXR for any underlying etiology. Start Dexamethasone   - dexamethasone (DECADRON) 4 MG tablet; Take 1 tab 3 x /day for 2 days, then 2 x /day for 2  Days,  then 1 tab daily  Dispense: 13 tablet; Refill: 0 - doxycycline (VIBRA-TABS) 100 MG tablet; Take 1 tablet (100 mg total) by mouth 2 (two) times daily for 7 days.  Dispense: 14 tablet; Refill: 0 - DG Chest 2 View; Future  Acute cough Stay well hydrated to keep mucus thin and productive.  - DG Chest 2 View; Future  4. Wheezing  - dexamethasone (DECADRON) 4 MG tablet; Take 1 tab 3 x /day for 2 days, then 2 x /day for 2  Days,  then 1 tab daily  Dispense: 13 tablet; Refill: 0 - DG Chest 2 View; Future  5. Shortness of breath  - dexamethasone (DECADRON) 4 MG tablet; Take 1 tab 3 x /day for 2 days, then 2 x /day for 2  Days,  then 1 tab daily  Dispense: 13 tablet; Refill: 0 - DG Chest 2 View; Future  6. Chest tightness/Family Hx of Blood Clots  CT Scan to Rule Out PE  - dexamethasone (DECADRON) 4 MG tablet; Take 1 tab 3 x /day for 2 days, then 2 x /day for 2  Days,  then 1 tab daily  Dispense: 13 tablet; Refill: 0 - DG Chest 2 View; Future - CT Angio Chest PE  Call 911 and report to ER for any increase in difficulty breathing.   Notify office for further evaluation and treatment, questions or concerns if any reported s/s fail to improve.   The patient was advised to call back or seek an in-person evaluation if any symptoms worsen or if the condition fails to improve as  anticipated.   Further disposition pending results of labs. Discussed med's effects and SE's.    I discussed the assessment and treatment plan with the patient. The patient was provided an opportunity to ask questions and all were answered. The patient agreed with the plan and demonstrated an understanding of the instructions.  Discussed med's effects and SE's. Screening labs and tests as requested with regular follow-up as recommended.  I provided 25 minutes of face-to-face time during this encounter including counseling, chart review, and critical decision making was preformed.  Today's Plan of Care is based on a patient-centered health care approach known as shared decision making - the decisions, tests and treatments allow for patient preferences and values to be balanced with clinical evidence.     Future Appointments  Date Time Provider Department Center  08/04/2022  2:15 PM Judi Saa, DO [facility] None  08/18/2022 10:30 AM Lucky Cowboy, MD [facility] None  01/15/2023 11:00 AM Lucky Cowboy, MD [facility] None    ------------------------------------------------------------------------------------------------------------------   HPI BP 110/70   Pulse 68   Temp 97.7 F (36.5 C)   Ht 5' (1.524 m)   Wt 136 lb (61.7 kg)   SpO2 99%   BMI 26.56 kg/m    Patient continues to complain of symptoms of a URI, possible  sinusitis. Symptoms include congestion, cough described as productive, nasal congestion, shortness of breath, wheezing, and chest tightness . Onset of symptoms was 8 days ago, and has been unchanged since that time. Treatment to date: antibiotics, antihistamines, cough suppressants, decongestants, and nasal steroids.  She was seen at UC 6 days ago, then at our office x 3 days ago without improvement.  She continues to have chest tightness and shortness of breath.   Past Medical History:  Diagnosis Date   Allergy    Anxiety    Asthma    CAD (coronary artery  disease)    Cardiomyopathy (HCC)    a. Echo 8/16:  EF 40-45%, apical and ant-septal HK, Gr 2 DD   Diverticula, colon    Family history of adverse reaction to anesthesia    sisiter had PONV   GAD (generalized anxiety disorder)    Headache    rare migraines   History of cardiovascular stress test    Myoview 8/16:  EF 48%, anterior and apical defect c/w breast atten, no ischemia; Low Risk // Nuclear stress test 9/18: Low risk stress nuclear study with fixed septal defect consistent with LBBB vs prior infarct; no ischemia; EF 53 with septal akinesis.   Hyperlipidemia    Hypertension    Irregular periods/menstrual cycles    LBBB (left bundle branch block)    Melanoma (HCC)    right arm   Seasonal allergies    TIA (transient ischemic attack)    Torn meniscus    Vitamin B12 deficiency    Vitamin D deficiency      Allergies  Allergen Reactions   Metoprolol     Not allergy, but intolerance - had bradycardia with HR in the 40s even with low dose   Oxycodone Itching    Itching: Trunk, palms of hands, legs, back      Current Outpatient Medications on File Prior to Visit  Medication Sig   acyclovir ointment (ZOVIRAX) 5 % Apply 1 application topically every 3 (three) hours.   Adapalene-Benzoyl Peroxide (EPIDUO) 0.1-2.5 % gel Apply 1 application topically as needed (acne).   albuterol (VENTOLIN HFA) 108 (90 Base) MCG/ACT inhaler INHALE 2 PUFFS INTO LUNGS EVERY 4 HOURS AS NEEDED FOR WHEEZING OR SHORTNESS OF BREATH   amLODipine (NORVASC) 5 MG tablet TAKE 1 TABLET BY MOUTH EVERY DAY FOR BLOOD PRESSURE   aspirin EC 81 MG tablet Take 1 tablet (81 mg total) by mouth daily.   Cholecalciferol (VITAMIN D PO) Take 5,000 Units by mouth daily.    escitalopram (LEXAPRO) 20 MG tablet Take 1 tablet Daily for Mood & Anxiety   esomeprazole (NEXIUM) 40 MG capsule Take 1 capsule (40 mg total) by mouth every other day. Taper off after 2 weeks of every other day. Start Pepcid 20 mg twice a day   estradiol  (ESTRACE) 0.5 MG tablet Take 0.5 mg by mouth daily.   famotidine (PEPCID) 20 MG tablet TAKE 1 TABLET BY MOUTH TWICE A DAY   fluticasone (FLONASE) 50 MCG/ACT nasal spray SPRAY 2 SPRAYS INTO EACH NOSTRIL EVERY DAY   Fluticasone-Umeclidin-Vilant (TRELEGY ELLIPTA) 200-62.5-25 MCG/ACT AEPB Inhale 1 Inhalation into the lungs daily.   furosemide (LASIX) 40 MG tablet TAKE 1 TABLET EVERY DAY FOR BLOOD PRESSURE AND FLUID   hydrocortisone 2.5 % cream APPLY DAILY TO SKIN TO AFFECTED AREA TWICE A DAY FOR 2 WEEKS   ipratropium-albuterol (DUONEB) 0.5-2.5 (3) MG/3ML SOLN Take 3 mLs by nebulization every 4 (four) hours as needed.   loperamide (IMODIUM  A-D) 2 MG tablet Take 1 tablet (2 mg total) by mouth daily. Titrate as needed   medroxyPROGESTERone (PROVERA) 2.5 MG tablet Take 2.5 mg by mouth daily.   metoprolol succinate (TOPROL-XL) 25 MG 24 hr tablet Take 0.5 tablets (12.5 mg total) by mouth daily.   montelukast (SINGULAIR) 10 MG tablet Take 1 tablet daily for Allergies & Asthma   olmesartan (BENICAR) 20 MG tablet TAKE 1 TABLET BY MOUTH DAILY.   Omega-3 Fatty Acids (FISH OIL PO) Take 1 capsule by mouth daily.   potassium chloride (KLOR-CON) 10 MEQ tablet Take 1 tablet (10 mEq total) by mouth 2 (two) times daily.   rosuvastatin (CRESTOR) 10 MG tablet TAKE 1 TABLET BY MOUTH EVERY DAY   topiramate (TOPAMAX) 50 MG tablet Take 1 tablet  2 x /day  at Suppertime & Bedtime  for Dieting & Weight Loss   valACYclovir (VALTREX) 500 MG tablet Take  1 tablet  2 x /day as needed for Viral Suppression   azithromycin (ZITHROMAX) 250 MG tablet Take 2 tablets on  Day 1,  followed by 1 tablet  daily for 4 more days    for Sinusitis  /Bronchitis   predniSONE (DELTASONE) 10 MG tablet 1 tab 3 x day for 2 days, then 1 tab 2 x day for 2 days, then 1 tab 1 x day for 3 days   Current Facility-Administered Medications on File Prior to Visit  Medication   ipratropium-albuterol (DUONEB) 0.5-2.5 (3) MG/3ML nebulizer solution 3 mL     ROS: all negative except what is noted in the HPI.   Physical Exam:  BP 110/70   Pulse 68   Temp 97.7 F (36.5 C)   Ht 5' (1.524 m)   Wt 136 lb (61.7 kg)   SpO2 99%   BMI 26.56 kg/m   General Appearance: NAD.  Awake, conversant and cooperative. Eyes: PERRLA, EOMs intact.  Sclera white.  Conjunctiva without erythema. Sinuses: Frontal/maxillary tenderness.  No nasal discharge. Nares patent.  ENT/Mouth: Ext aud canals clear.  Bilateral TMs w/DOL and without erythema or bulging. Hearing intact.  Posterior pharynx without swelling or exudate.  Tonsils without swelling or erythema.  Neck: Supple.  No masses, nodules or thyromegaly. Respiratory: Effort is regular with non-labored breathing. Breath sounds are equal bilaterally with scattered wheezing through all lung fields during inspiration and expiration.   Cardio: RRR with no MRGs. Brisk peripheral pulses without edema.  Abdomen: Active BS in all four quadrants.  Soft and non-tender without guarding, rebound tenderness, hernias or masses. Lymphatics: Non tender without lymphadenopathy.  Musculoskeletal: Full ROM, 5/5 strength, normal ambulation.  No clubbing or cyanosis. Skin: Appropriate color for ethnicity. Warm without rashes, lesions, ecchymosis, ulcers.  Neuro: CN II-XII grossly normal. Normal muscle tone without cerebellar symptoms and intact sensation.   Psych: AO X 3,  appropriate mood and affect, insight and judgment.     Adela Glimpse, NP 3:19 PM [facility]

## 2022-07-07 ENCOUNTER — Encounter: Payer: Self-pay | Admitting: Nurse Practitioner

## 2022-07-07 ENCOUNTER — Other Ambulatory Visit: Payer: Self-pay

## 2022-07-07 ENCOUNTER — Other Ambulatory Visit: Payer: Self-pay | Admitting: Nurse Practitioner

## 2022-07-07 ENCOUNTER — Encounter (HOSPITAL_BASED_OUTPATIENT_CLINIC_OR_DEPARTMENT_OTHER): Payer: Self-pay

## 2022-07-07 ENCOUNTER — Emergency Department (HOSPITAL_BASED_OUTPATIENT_CLINIC_OR_DEPARTMENT_OTHER): Payer: BC Managed Care – PPO

## 2022-07-07 ENCOUNTER — Emergency Department (HOSPITAL_BASED_OUTPATIENT_CLINIC_OR_DEPARTMENT_OTHER)
Admission: EM | Admit: 2022-07-07 | Discharge: 2022-07-07 | Disposition: A | Discharge status: Home / Self Care | Payer: BC Managed Care – PPO | Attending: Emergency Medicine | Admitting: Emergency Medicine

## 2022-07-07 ENCOUNTER — Other Ambulatory Visit: Payer: BC Managed Care – PPO

## 2022-07-07 ENCOUNTER — Emergency Department (HOSPITAL_BASED_OUTPATIENT_CLINIC_OR_DEPARTMENT_OTHER): Payer: BC Managed Care – PPO | Admitting: Radiology

## 2022-07-07 DIAGNOSIS — Z7982 Long term (current) use of aspirin: Secondary | ICD-10-CM | POA: Insufficient documentation

## 2022-07-07 DIAGNOSIS — I10 Essential (primary) hypertension: Secondary | ICD-10-CM | POA: Insufficient documentation

## 2022-07-07 DIAGNOSIS — Z7951 Long term (current) use of inhaled steroids: Secondary | ICD-10-CM | POA: Diagnosis not present

## 2022-07-07 DIAGNOSIS — D72829 Elevated white blood cell count, unspecified: Secondary | ICD-10-CM | POA: Insufficient documentation

## 2022-07-07 DIAGNOSIS — Z20822 Contact with and (suspected) exposure to covid-19: Secondary | ICD-10-CM | POA: Insufficient documentation

## 2022-07-07 DIAGNOSIS — R079 Chest pain, unspecified: Secondary | ICD-10-CM | POA: Diagnosis not present

## 2022-07-07 DIAGNOSIS — Z79899 Other long term (current) drug therapy: Secondary | ICD-10-CM | POA: Diagnosis not present

## 2022-07-07 DIAGNOSIS — R0602 Shortness of breath: Secondary | ICD-10-CM | POA: Diagnosis not present

## 2022-07-07 DIAGNOSIS — I2510 Atherosclerotic heart disease of native coronary artery without angina pectoris: Secondary | ICD-10-CM | POA: Diagnosis not present

## 2022-07-07 DIAGNOSIS — J45909 Unspecified asthma, uncomplicated: Secondary | ICD-10-CM | POA: Diagnosis not present

## 2022-07-07 LAB — CBC
HCT: 45.8 % (ref 36.0–46.0)
Hemoglobin: 14.9 g/dL (ref 12.0–15.0)
MCH: 30.5 pg (ref 26.0–34.0)
MCHC: 32.5 g/dL (ref 30.0–36.0)
MCV: 93.9 fL (ref 80.0–100.0)
Platelets: 421 10*3/uL — ABNORMAL HIGH (ref 150–400)
RBC: 4.88 MIL/uL (ref 3.87–5.11)
RDW: 13.1 % (ref 11.5–15.5)
WBC: 20.1 10*3/uL — ABNORMAL HIGH (ref 4.0–10.5)
nRBC: 0 % (ref 0.0–0.2)

## 2022-07-07 LAB — BASIC METABOLIC PANEL
Anion gap: 10 (ref 5–15)
BUN: 18 mg/dL (ref 8–23)
CO2: 24 mmol/L (ref 22–32)
Calcium: 9.3 mg/dL (ref 8.9–10.3)
Chloride: 104 mmol/L (ref 98–111)
Creatinine, Ser: 0.78 mg/dL (ref 0.44–1.00)
GFR, Estimated: >60 mL/min (ref >60)
Glucose, Bld: 239 mg/dL — ABNORMAL HIGH (ref 70–99)
Potassium: 4 mmol/L (ref 3.5–5.1)
Sodium: 138 mmol/L (ref 135–145)

## 2022-07-07 LAB — RESP PANEL BY RT-PCR (RSV, FLU A&B, COVID)  RVPGX2
Influenza A by PCR: NEGATIVE
Influenza B by PCR: NEGATIVE
Resp Syncytial Virus by PCR: NEGATIVE
SARS Coronavirus 2 by RT PCR: NEGATIVE

## 2022-07-07 LAB — TROPONIN I (HIGH SENSITIVITY)
Troponin I (High Sensitivity): 6 ng/L (ref <18)
Troponin I (High Sensitivity): 5 ng/L (ref <18)

## 2022-07-07 MED ORDER — IPRATROPIUM-ALBUTEROL 0.5-2.5 (3) MG/3ML IN SOLN
3.0000 mL | Freq: Once | RESPIRATORY_TRACT | Status: AC
  Administered 2022-07-07: 3 mL via RESPIRATORY_TRACT
  Filled 2022-07-07: qty 3

## 2022-07-07 MED ORDER — IOHEXOL 350 MG/ML SOLN
100.0000 mL | Freq: Once | INTRAVENOUS | Status: AC | PRN
  Administered 2022-07-07: 75 mL via INTRAVENOUS

## 2022-07-07 NOTE — ED Notes (Signed)
Patient verbalizes understanding of discharge instructions. Opportunity for questioning and answers were provided. Patient discharged from ED.  °

## 2022-07-07 NOTE — Discharge Instructions (Addendum)
Your workup today was all reassuring.  No blood clot or other concerning findings on the CT scan.  No evidence of pneumonia.  Continue taking the steroids, antibiotics you are prescribed.  Continue using your breathing treatment.  For any concerning symptoms return to the emergency room.

## 2022-07-07 NOTE — ED Triage Notes (Signed)
Patient here POV from Home.  Notes CP and SOB recently. No Known Fevers.   Endorses recent Difficulty with Asthma. Has been utilizing Nebulizer treatments without much relief.   NAD Noted During Triage. A&Ox4. GCS 15. Ambulatory.

## 2022-07-07 NOTE — ED Provider Notes (Signed)
[place] EMERGENCY DEPARTMENT AT [facility] Provider Note   CSN: [id number] Arrival date & time: 07/07/22  1307     History  Chief Complaint  Patient presents with   Shortness of Breath    Gail Bailey is a 62 y.o. female.  62 year old female with past medical history significant for asthma presents today for concern of shortness of breath which has been ongoing now for about a week and a half along with bandlike pain around her torso.  Denies history of PE, DVT.  Endorses cough which is nonproductive which is typical for her asthma exacerbation.  Denies any wheezing.  She states she does not typically get wheezing with her asthma exacerbation.  Has been using DuoNebs, inhaler without any improvement.  Saw her PCP yesterday who ordered chest x-ray, PE study.  She has had the x-ray but has not had the PE study.  The history is provided by the patient. No language interpreter was used.       Home Medications Prior to Admission medications   Medication Sig Start Date End Date Taking? Authorizing Provider  acyclovir ointment (ZOVIRAX) 5 % Apply 1 application topically every 3 (three) hours. 02/13/20   Elder Negus, NP  Adapalene-Benzoyl Peroxide (EPIDUO) 0.1-2.5 % gel Apply 1 application topically as needed (acne). 08/13/17   Lucky Cowboy, MD  albuterol (VENTOLIN HFA) 108 (90 Base) MCG/ACT inhaler INHALE 2 PUFFS INTO LUNGS EVERY 4 HOURS AS NEEDED FOR WHEEZING OR SHORTNESS OF BREATH    [provider]  amLODipine (NORVASC) 5 MG tablet TAKE 1 TABLET BY MOUTH EVERY DAY FOR BLOOD PRESSURE 03/11/22   Adela Glimpse, NP  aspirin EC 81 MG tablet Take 1 tablet (81 mg total) by mouth daily. 08/22/14   Tereso Newcomer T, PA-C  Cholecalciferol (VITAMIN D PO) Take 5,000 Units by mouth daily.     [provider]  dexamethasone (DECADRON) 4 MG tablet Take 1 tab 3 x /day for 2 days, then 2 x /day for 2  Days,  then 1 tab daily 07/06/22   Adela Glimpse, NP  doxycycline  (VIBRA-TABS) 100 MG tablet Take 1 tablet (100 mg total) by mouth 2 (two) times daily for 7 days. 07/06/22 07/13/22  Adela Glimpse, NP  escitalopram (LEXAPRO) 20 MG tablet Take 1 tablet Daily for Mood & Anxiety 03/02/22   Lucky Cowboy, MD  esomeprazole (NEXIUM) 40 MG capsule Take 1 capsule (40 mg total) by mouth every other day. Taper off after 2 weeks of every other day. Start Pepcid 20 mg twice a day 02/09/22   Armbruster, Willaim Rayas, MD  estradiol (ESTRACE) 0.5 MG tablet Take 0.5 mg by mouth daily.    [provider]  famotidine (PEPCID) 20 MG tablet TAKE 1 TABLET BY MOUTH TWICE A DAY 06/29/22   Armbruster, Willaim Rayas, MD  fluticasone [facility]) 50 MCG/ACT nasal spray SPRAY 2 SPRAYS INTO EACH NOSTRIL EVERY DAY 09/26/18   Lucky Cowboy, MD  Fluticasone-Umeclidin-Vilant (TRELEGY ELLIPTA) 200-62.5-25 MCG/ACT AEPB Inhale 1 Inhalation into the lungs daily. 07/02/22   Adela Glimpse, NP  furosemide (LASIX) 40 MG tablet TAKE 1 TABLET EVERY DAY FOR BLOOD PRESSURE AND FLUID 12/12/21   Raynelle Dick, NP  hydrocortisone 2.5 % cream APPLY DAILY TO SKIN TO AFFECTED AREA TWICE A DAY FOR 2 WEEKS    [provider]  ipratropium-albuterol (DUONEB) 0.5-2.5 (3) MG/3ML SOLN Take 3 mLs by nebulization every 4 (four) hours as needed. 07/02/22   Adela Glimpse, NP  loperamide (IMODIUM A-D) 2  MG tablet Take 1 tablet (2 mg total) by mouth daily. Titrate as needed 06/18/22   Armbruster, Willaim Rayas, MD  medroxyPROGESTERone (PROVERA) 2.5 MG tablet Take 2.5 mg by mouth daily.    [provider]  metoprolol succinate (TOPROL-XL) 25 MG 24 hr tablet Take 0.5 tablets (12.5 mg total) by mouth daily. 06/18/22   Nahser, Deloris Ping, MD  montelukast (SINGULAIR) 10 MG tablet Take 1 tablet daily for Allergies & Asthma 07/02/22   Adela Glimpse, NP  olmesartan (BENICAR) 20 MG tablet TAKE 1 TABLET BY MOUTH DAILY. 11/27/21   Nahser, Deloris Ping, MD  Omega-3 Fatty Acids (FISH OIL PO) Take 1 capsule by mouth daily.    [provider]  potassium chloride (KLOR-CON) 10 MEQ tablet Take 1 tablet (10 mEq total) by mouth 2 (two) times daily. 06/18/22   Nahser, Deloris Ping, MD  rosuvastatin (CRESTOR) 10 MG tablet TAKE 1 TABLET BY MOUTH EVERY DAY 06/29/22   Nahser, Deloris Ping, MD  topiramate (TOPAMAX) 50 MG tablet Take 1 tablet  2 x /day  at Suppertime & Bedtime  for Dieting & Weight Loss 09/09/21   Lucky Cowboy, MD  valACYclovir (VALTREX) 500 MG tablet Take  1 tablet  2 x /day as needed for Viral Suppression 08/01/20   Lucky Cowboy, MD      Allergies    Metoprolol and Oxycodone    Review of Systems   Review of Systems  Constitutional:  Negative for fever.  Respiratory:  Positive for cough and shortness of breath. Negative for wheezing.   Cardiovascular:  Positive for chest pain. Negative for palpitations.  Gastrointestinal:  Negative for abdominal pain and nausea.  All other systems reviewed and are negative.   Physical Exam Updated Vital Signs BP 131/83 (BP Location: Right Arm)   Pulse 60   Temp 97.9 F (36.6 C) (Oral)   Resp 18   Ht 5' (1.524 m)   Wt 61.2 kg   SpO2 100%   BMI 26.37 kg/m  Physical Exam Vitals and nursing note reviewed.  Constitutional:      General: She is not in acute distress.    Appearance: Normal appearance. She is not ill-appearing.  HENT:     Head: Normocephalic and atraumatic.     Nose: Nose normal.  Eyes:     General: No scleral icterus.    Extraocular Movements: Extraocular movements intact.     Conjunctiva/sclera: Conjunctivae normal.  Cardiovascular:     Rate and Rhythm: Normal rate and regular rhythm.     Heart sounds: Normal heart sounds.  Pulmonary:     Effort: Pulmonary effort is normal. No respiratory distress.     Breath sounds: Normal breath sounds. No wheezing or rales.  Abdominal:     General: There is no distension.     Tenderness: There is no abdominal tenderness.  Musculoskeletal:        General: Normal range of motion.     Cervical back: Normal  range of motion.     Right lower leg: No edema.     Left lower leg: No edema.  Skin:    General: Skin is warm and dry.  Neurological:     General: No focal deficit present.     Mental Status: She is alert. Mental status is at baseline.     ED Results / Procedures / Treatments   Labs (all labs ordered are listed, but only abnormal results are displayed) Labs Reviewed  CBC - Abnormal; Notable for the  following components:      Result Value   WBC 20.1 (*)    Platelets 421 (*)    All other components within normal limits  BASIC METABOLIC PANEL  TROPONIN I (HIGH SENSITIVITY)    EKG None  Radiology DG Chest 2 View  Result Date: 07/07/2022 CLINICAL DATA:  Cough.  Shortness of breath.  Chest tightness. EXAM: CHEST - 2 VIEW COMPARISON:  04/02/2020 FINDINGS: Right axillary node dissection. Midline trachea. Normal heart size and mediastinal contours. No pleural effusion or pneumothorax. Clear lungs. IMPRESSION: No acute cardiopulmonary disease. Electronically Signed   By: Jeronimo Greaves M.D.   On: 07/07/2022 13:34    Procedures Procedures    Medications Ordered in ED Medications - No data to display  ED Course/ Medical Decision Making/ A&P                             Medical Decision Making Amount and/or Complexity of Data Reviewed Labs: ordered. Radiology: ordered.  Risk Prescription drug management.   Medical Decision Making / ED Course   This patient presents to the ED for concern of shortness of breath, chest pain, this involves an extensive number of treatment options, and is a complaint that carries with it a high risk of complications and morbidity.  The differential diagnosis includes asthma exacerbation, pneumonia, ACS, PE, viral URI  MDM: 62 year old female presents today with for evaluation of shortness of breath along with chest pain that has been ongoing for about a week and a half.  Has history of asthma.  Will evaluate with blood work, she had a chest x-ray  yesterday if so we will defer 1 today.  Will obtain PE study.  She is hemodynamically stable.  CT chest PE study negative for pulmonary embolus or other acute findings.  Respiratory panel negative.  CBC does show leukocytosis but likely reactive to recently prescribed dexamethasone that she is taking.  BMP shows elevated glucose at 239 otherwise no acute findings.  Chest x-ray was obtained yesterday which also shows no acute findings.  Findings discussed with patient.  She is stable for discharge.  She will follow-up with PCP.  She will continue recently prescribed doxycycline and dexamethasone.  Likely component of viral URI and asthma exacerbation.   Lab Tests: -I ordered, reviewed, and interpreted labs.   The pertinent results include:   Labs Reviewed  CBC - Abnormal; Notable for the following components:      Result Value   WBC 20.1 (*)    Platelets 421 (*)    All other components within normal limits  BASIC METABOLIC PANEL  TROPONIN I (HIGH SENSITIVITY)  TROPONIN I (HIGH SENSITIVITY)      EKG  EKG Interpretation  Date/Time:    Ventricular Rate:    PR Interval:    QRS Duration:   QT Interval:    QTC Calculation:   R Axis:     Text Interpretation:           Imaging Studies ordered: I ordered imaging studies including CT chest PE study I independently visualized and interpreted imaging. I agree with the radiologist interpretation   Medicines ordered and prescription drug management: No orders of the defined types were placed in this encounter.   -I have reviewed the patients home medicines and have made adjustments as needed   Cardiac Monitoring: The patient was maintained on a cardiac monitor.  I personally viewed and interpreted the cardiac monitored which  showed an underlying rhythm of: Normal sinus rhythm   Reevaluation: After the interventions noted above, I reevaluated the patient and found that they have :stayed the same  Co morbidities that complicate  the patient evaluation  Past Medical History:  Diagnosis Date   Allergy    Anxiety    Asthma    CAD (coronary artery disease)    Cardiomyopathy (HCC)    a. Echo 8/16:  EF 40-45%, apical and ant-septal HK, Gr 2 DD   Diverticula, colon    Family history of adverse reaction to anesthesia    sisiter had PONV   GAD (generalized anxiety disorder)    Headache    rare migraines   History of cardiovascular stress test    Myoview 8/16:  EF 48%, anterior and apical defect c/w breast atten, no ischemia; Low Risk // Nuclear stress test 9/18: Low risk stress nuclear study with fixed septal defect consistent with LBBB vs prior infarct; no ischemia; EF 53 with septal akinesis.   Hyperlipidemia    Hypertension    Irregular periods/menstrual cycles    LBBB (left bundle branch block)    Melanoma (HCC)    right arm   Seasonal allergies    TIA (transient ischemic attack)    Torn meniscus    Vitamin B12 deficiency    Vitamin D deficiency       Dispostion: Patient appropriate for discharge.  Discharged in stable condition.  Final Clinical Impression(s) / ED Diagnoses Final diagnoses:  Shortness of breath    Rx / DC Orders ED Discharge Orders     None         Marita Kansas, PA-C 07/07/22 1745    Derwood Kaplan, MD 07/09/22 [phone]

## 2022-07-15 DIAGNOSIS — J45909 Unspecified asthma, uncomplicated: Secondary | ICD-10-CM | POA: Diagnosis not present

## 2022-07-19 ENCOUNTER — Other Ambulatory Visit: Payer: Self-pay | Admitting: Cardiovascular Disease

## 2022-07-22 ENCOUNTER — Other Ambulatory Visit: Payer: Self-pay | Admitting: Cardiovascular Disease

## 2022-07-23 NOTE — Addendum Note (Signed)
Addended by: Adela Glimpse on: 07/23/2022 09:32 AM   Modules accepted: Orders

## 2022-07-26 ENCOUNTER — Other Ambulatory Visit: Payer: Self-pay | Admitting: Cardiovascular Disease

## 2022-07-27 NOTE — Progress Notes (Signed)
[facility] [address] [place] [address] Phone: [phone] Subjective:   INadine Counts, am serving as a scribe for Dr. Antoine Primas.  I'm seeing this patient by the request  of:  Lucky Cowboy, MD  CC: Left-sided back pain  [id number]  Gail Bailey is a 62 y.o. female coming in with complaint of L side back pain. Aug last year. Ain is the same. Feels like a deep pain, Like Tyson punched her in the back. Has had various test. Ice can numb it sometimes.    Reviewing patient's chart patient in June was in the emergency department and did have a significant elevation in white blood cells.  Reviewed patient CT abdomen pelvis in December 2023.  Found to have degenerative disc disease at L5-S1 with calcific changes noted of the posterior lateral ligamentous area.  Past Medical History:  Diagnosis Date   Allergy    Anxiety    Asthma    CAD (coronary artery disease)    Cardiomyopathy (HCC)    a. Echo 8/16:  EF 40-45%, apical and ant-septal HK, Gr 2 DD   Diverticula, colon    Family history of adverse reaction to anesthesia    sisiter had PONV   GAD (generalized anxiety disorder)    Headache    rare migraines   History of cardiovascular stress test    Myoview 8/16:  EF 48%, anterior and apical defect c/w breast atten, no ischemia; Low Risk // Nuclear stress test 9/18: Low risk stress nuclear study with fixed septal defect consistent with LBBB vs prior infarct; no ischemia; EF 53 with septal akinesis.   Hyperlipidemia    Hypertension    Irregular periods/menstrual cycles    LBBB (left bundle branch block)    Melanoma (HCC)    right arm   Seasonal allergies    TIA (transient ischemic attack)    Torn meniscus    Vitamin B12 deficiency    Vitamin D deficiency    Past Surgical History:  Procedure Laterality Date   ABLATION     BREAST REDUCTION SURGERY     CARDIAC CATHETERIZATION     CESAREAN SECTION     x2   KNEE SURGERY      partial knee replacement right   MELANOMA EXCISION WITH SENTINEL LYMPH NODE BIOPSY Right 09/15/2017   Procedure: BIOPSY RIGHT SHOULDER MOLE AND WIDE LOCAL EXCISION OF RIGHT ARM MELANOMA, ADVANCEMENT FLAP CLOSURE, SENTINEL LYMPH NODE BIOPSY  RIGHT ARM MELANOMA;  Surgeon: Almond Lint, MD;  Location: [facility];  Service: General;  Laterality: Right;   REDUCTION MAMMAPLASTY Bilateral    REPAIR QUADRICEPS / HAMSTRING MUSCLE     REPLACEMENT TOTAL KNEE Left 09/20/2018   Dr[facility] Simeon Craft at [facility]   TONSILLECTOMY     tubes in ears  10/12   Social History   Socioeconomic History   Marital status: Married    Spouse name: Not on file   Number of children: 2   Years of education: Not on file   Highest education level: Not on file  Occupational History   Occupation: Dentist: [employer]  Tobacco Use   Smoking status: Never   Smokeless tobacco: Never  Vaping Use   Vaping status: Never Used  Substance and Sexual Activity   Alcohol use: Yes    Comment: 1-2 glasses wine nightly   Drug use: No   Sexual activity: Not on file  Other Topics Concern  Not on file  Social History Narrative   Lives with husband and son   Social Determinants of Health   Financial Resource Strain: Not on file  Food Insecurity: No Food Insecurity (12/19/2021)   Hunger Vital Sign    Worried About Running Out of Food in the Last Year: Never true    Ran Out of Food in the Last Year: Never true  Transportation Needs: Not on file  Physical Activity: Not on file  Stress: Not on file  Social Connections: Not on file   Allergies  Allergen Reactions   Metoprolol     Not allergy, but intolerance - had bradycardia with HR in the 40s even with low dose   Oxycodone Itching    Itching: Trunk, palms of hands, legs, back     Family History  Problem Relation Age of Onset   Diabetes Mother    Heart disease Mother    Hypertension Mother    Heart attack Mother 68   Other Father         MVA   Asthma Sister    Asthma Son    Cancer Maternal Grandmother        Uterine   Breast cancer Maternal Grandmother    Heart attack Paternal Grandmother    Colon cancer Neg Hx    Colon polyps Neg Hx    Esophageal cancer Neg Hx    Stomach cancer Neg Hx    Rectal cancer Neg Hx     Current Outpatient Medications (Endocrine & Metabolic):    dexamethasone (DECADRON) 4 MG tablet, Take 1 tab 3 x /day for 2 days, then 2 x /day for 2  Days,  then 1 tab daily   estradiol (ESTRACE) 0.5 MG tablet, Take 0.5 mg by mouth daily.   medroxyPROGESTERone (PROVERA) 2.5 MG tablet, Take 2.5 mg by mouth daily.   Current Outpatient Medications (Cardiovascular):    amLODipine (NORVASC) 5 MG tablet, TAKE 1 TABLET BY MOUTH EVERY DAY FOR BLOOD PRESSURE   furosemide (LASIX) 40 MG tablet, TAKE 1 TABLET EVERY DAY FOR BLOOD PRESSURE AND FLUID   metoprolol succinate (TOPROL-XL) 25 MG 24 hr tablet, Take 0.5 tablets (12.5 mg total) by mouth daily.   olmesartan (BENICAR) 20 MG tablet, Take 1 tablet (20 mg total) by mouth daily. Please contact the office for additional refills (first attempt)   rosuvastatin (CRESTOR) 10 MG tablet, Take 1 tablet (10 mg total) by mouth daily. Please call office to schedule an appt for further refills. Thank you   Current Outpatient Medications (Respiratory):    albuterol (VENTOLIN HFA) 108 (90 Base) MCG/ACT inhaler, INHALE 2 PUFFS INTO LUNGS EVERY 4 HOURS AS NEEDED FOR WHEEZING OR SHORTNESS OF BREATH   fluticasone (FLONASE) 50 MCG/ACT nasal spray, SPRAY 2 SPRAYS INTO EACH NOSTRIL EVERY DAY   Fluticasone-Umeclidin-Vilant (TRELEGY ELLIPTA) 200-62.5-25 MCG/ACT AEPB, Inhale 1 Inhalation into the lungs daily.   ipratropium-albuterol (DUONEB) 0.5-2.5 (3) MG/3ML SOLN, Take 3 mLs by nebulization every 4 (four) hours as needed.   montelukast (SINGULAIR) 10 MG tablet, Take 1 tablet daily for Allergies & Asthma  Current Facility-Administered Medications (Respiratory):    ipratropium-albuterol  (DUONEB) 0.5-2.5 (3) MG/3ML nebulizer solution 3 mL  Current Outpatient Medications (Analgesics):    aspirin EC 81 MG tablet, Take 1 tablet (81 mg total) by mouth daily.     Current Outpatient Medications (Other):    acyclovir ointment (ZOVIRAX) 5 %, Apply 1 application topically every 3 (three) hours.   Adapalene-Benzoyl Peroxide (EPIDUO) 0.1-2.5 %  gel, Apply 1 application topically as needed (acne).   Cholecalciferol (VITAMIN D PO), Take 5,000 Units by mouth daily.    escitalopram (LEXAPRO) 20 MG tablet, Take 1 tablet Daily for Mood & Anxiety   esomeprazole (NEXIUM) 40 MG capsule, Take 1 capsule (40 mg total) by mouth every other day. Taper off after 2 weeks of every other day. Start Pepcid 20 mg twice a day   famotidine (PEPCID) 20 MG tablet, TAKE 1 TABLET BY MOUTH TWICE A DAY   hydrocortisone 2.5 % cream, APPLY DAILY TO SKIN TO AFFECTED AREA TWICE A DAY FOR 2 WEEKS   loperamide (IMODIUM A-D) 2 MG tablet, Take 1 tablet (2 mg total) by mouth daily. Titrate as needed   Omega-3 Fatty Acids (FISH OIL PO), Take 1 capsule by mouth daily.   potassium chloride (KLOR-CON) 10 MEQ tablet, Take 1 tablet (10 mEq total) by mouth 2 (two) times daily. Please call office to schedule an appt for further refills. Thank you   topiramate (TOPAMAX) 50 MG tablet, Take 1 tablet  2 x /day  at Suppertime & Bedtime  for Dieting & Weight Loss   valACYclovir (VALTREX) 500 MG tablet, Take  1 tablet  2 x /day as needed for Viral Suppression    Reviewed prior external information including notes and imaging from  primary care provider As well as notes that were available from care everywhere and other healthcare systems.  Past medical history, social, surgical and family history all reviewed in electronic medical record.  No pertanent information unless stated regarding to the chief complaint.   Review of Systems:  No headache, visual changes, nausea, vomiting, diarrhea, constipation, dizziness, abdominal pain,  skin rash, fevers, chills, night sweats, weight loss, swollen lymph nodes,  joint swelling, chest pain, shortness of breath, mood changes. POSITIVE muscle aches, body aches  Objective  Blood pressure 110/74, pulse 76, height 5' (1.524 m), weight 139 lb (63 kg), SpO2 98%.   General: No apparent distress alert and oriented x3 mood and affect normal, dressed appropriately.  HEENT: Pupils equal, extraocular movements intact  Respiratory: Patient's speak in full sentences and does not appear short of breath  Cardiovascular: No lower extremity edema, non tender, no erythema  Low back exam does have actually some mild crepitus noted and tenderness to palpation in the paraspinal musculature of the lumbar spine.  Patient does have some audible popping with flexion and extension in the area.  Patient is tender to palpation only on the left side.  Mild midline tenderness also noted.  No true radicular symptoms.  Worsening pain with extension.  Does have a mild shuffling gait secondary to pain.  Patient wants to avoid full extension of the back    Impression and Recommendations:    The above documentation has been reviewed and is accurate and complete Judi Saa, DO

## 2022-07-30 ENCOUNTER — Other Ambulatory Visit: Payer: Self-pay | Admitting: Cardiovascular Disease

## 2022-08-04 ENCOUNTER — Ambulatory Visit (INDEPENDENT_AMBULATORY_CARE_PROVIDER_SITE_OTHER): Payer: BC Managed Care – PPO

## 2022-08-04 ENCOUNTER — Ambulatory Visit: Payer: BC Managed Care – PPO | Admitting: Family Medicine

## 2022-08-04 VITALS — BP 110/74 | HR 76 | Ht 60.0 in | Wt 139.0 lb

## 2022-08-04 DIAGNOSIS — M545 Low back pain, unspecified: Secondary | ICD-10-CM | POA: Insufficient documentation

## 2022-08-04 DIAGNOSIS — G8929 Other chronic pain: Secondary | ICD-10-CM | POA: Diagnosis not present

## 2022-08-04 DIAGNOSIS — M47816 Spondylosis without myelopathy or radiculopathy, lumbar region: Secondary | ICD-10-CM | POA: Diagnosis not present

## 2022-08-04 DIAGNOSIS — M549 Dorsalgia, unspecified: Secondary | ICD-10-CM

## 2022-08-04 NOTE — Assessment & Plan Note (Signed)
Patient does have low back pain that is quite severe overall.  On CT scan there does appear to be calcific changes noted within the spinal canal.  Seems to be mostly in the L5-S1 area but some in the L4-L5 area as well.  This could be causing some potential nerve impingement.  Patient's pain does seem to be out of proportion.  Has failed multiple other workups including gastroenterology, gynecology, CT scans, physical therapy.  I given some exercises but I feel that at this point advanced imaging with x-ray and MRI would be beneficial.  Patient has had this pain now for nearly 1 year.  Depending on points is following patient could be a candidate for epidurals or nerve root injections.  Patient will follow-up after imaging to discuss further.  Failed multiple different medications including anti-inflammatories and needs to hold off secondary to her stomach issues now.

## 2022-08-04 NOTE — Patient Instructions (Addendum)
Xray today [facility] [phone] Call Today  When we receive your results we will contact you.  

## 2022-08-13 ENCOUNTER — Other Ambulatory Visit: Payer: Self-pay | Admitting: Cardiovascular Disease

## 2022-08-17 ENCOUNTER — Encounter: Payer: Self-pay | Admitting: Internal Medicine

## 2022-08-17 NOTE — Progress Notes (Unsigned)
Future Appointments  Date Time Provider Department  08/18/2022 10:30 AM Lucky Cowboy, MD [facility]  09/04/2022  3:35 PM Sharlene Dory, PA-C [facility]  01/15/2023 11:00 AM Lucky Cowboy, MD [facility]      History of Present Illness:       This very nice 62 y.o. MWF presents for 6 month follow up with HTN, HLD, Pre-Diabetes and Vitamin D Deficiency. In Sept 2019 , she had a melanoma excised from her Rt arm.       Patient is treated for HTN (2014) & BP has been controlled at home. Today's BP is at goal -                                 .  In 2017, she had a Normal Cardiac MRI scan  and in 2018 she had a neg Cardiac Myoview. Patient has had no complaints of any cardiac type chest pain, palpitations, dyspnea Pollyann Kennedy /PND, dizziness, claudication or dependent edema.        Hyperlipidemia is controlled with diet & meds. Patient denies myalgias or other med SE's. Last Lipids were at goal except elevated Trig's:  Lab Results  Component Value Date   CHOL 152 01/14/2022   HDL 48 (L) 01/14/2022   LDLCALC 72 01/14/2022   TRIG 232 (H) 01/14/2022   CHOLHDL 3.2 01/14/2022     Also, the patient has history of prediabetes (A1c 6.0% /2012) and has had no symptoms of reactive hypoglycemia, diabetic polys, paresthesias or visual blurring.  Last A1c was not at goal:  Lab Results  Component Value Date   HGBA1C 5.8 (H) 01/14/2022                                                         Further, the patient also has history of Vitamin D Deficiency ("48" on tx /2008)  and supplements Vitamin D without any suspected side-effects. Last vitamin D was at goal :   Lab Results  Component Value Date   VD25OH 74 01/14/2022        Current Outpatient Medications  Medication Instructions   acyclovir ointment (ZOVIRAX) 5 % 1 application , Topical, Every  3 hours   Adapalene-Benzoyl Peroxide (EPIDUO) 0.1-2.5 % gel 1 application , Topical, As needed   albuterol (VENTOLIN HFA) 108 (90  Base) MCG/ACT inhaler INHALE 2 PUFFS INTO LUNGS EVERY 4 HOURS AS NEEDED FOR WHEEZING OR SHORTNESS OF BREATH   amLODipine (NORVASC) 5 MG tablet TAKE 1 TABLET BY MOUTH EVERY DAY FOR BLOOD PRESSURE   aspirin EC 81 mg, Oral, Daily   Cholecalciferol (VITAMIN D PO) 5,000 Units, Oral, Daily   escitalopram (LEXAPRO) 20 MG tablet Take 1 tablet Daily for Mood & Anxiety   esomeprazole (NEXIUM) 40 mg, Oral, Every other day, Taper off after 2 weeks of every other day. Start Pepcid 20 mg twice a day   estradiol (ESTRACE) 0.5 mg, Oral, Daily   famotidine (PEPCID) 20 mg, Oral, 2 times daily   fluticasone (FLONASE) 50 MCG/ACT nasal spray SPRAY 2 SPRAYS INTO EACH NOSTRIL EVERY DAY   Fluticasone-Umeclidin-Vilant (TRELEGY ELLIPTA) 200-62.5-25 MCG/ACT AEPB 1 Inhalation, Inhalation, Daily   furosemide (LASIX) 40 MG tablet TAKE 1 TABLET EVERY DAY FOR BLOOD PRESSURE AND FLUID  hydrocortisone 2.5 % cream APPLY DAILY TO SKIN TO AFFECTED AREA TWICE A DAY FOR 2 WEEKS   ipratropium-albuterol (DUONEB) 0.5-2.5 (3) MG/3ML SOLN 3 mLs, Nebulization, Every 4 hours PRN   loperamide (IMODIUM A-D) 2 mg, Oral, Daily, Titrate as needed   medroxyPROGESTERone (PROVERA) 2.5 mg, Oral, Daily   metoprolol succinate (TOPROL-XL) 12.5 mg, Oral, Daily   montelukast (SINGULAIR) 10 MG tablet Take 1 tablet daily for Allergies & Asthma   olmesartan (BENICAR) 20 mg, Oral, Daily, Please contact the office for additional refills (first attempt)   Omega-3 Fatty Acids (FISH OIL PO) 1 capsule, Oral, Daily   potassium chloride (KLOR-CON) 10 MEQ tablet 10 mEq, Oral, 2 times daily   rosuvastatin (CRESTOR) 10 mg, Oral, Daily   topiramate (TOPAMAX) 50 MG tablet Take 1 tablet  2 x /day  at Suppertime & Bedtime  for Dieting & Weight Loss   valACYclovir (VALTREX) 500 MG tablet Take  1 tablet  2 x /day as needed for Viral Suppression     Allergies  Allergen Reactions   Oxycodone Itching: Trunk, palms of hands, legs, back     PMHx:   Past Medical  History:  Diagnosis Date   Allergy    Anxiety    Asthma    Cardiomyopathy (HCC)    a. Echo 8/16:  EF 40-45%, apical and ant-septal HK, Gr 2 DD   Diverticula, colon    Family history of adverse reaction to anesthesia    sisiter had PONV   Headache    rare migraines   History of cardiovascular stress test    Myoview 8/16:  EF 48%, anterior and apical defect c/w breast atten, no ischemia; Low Risk // Nuclear stress test 9/18: Low risk stress nuclear study with fixed septal defect consistent with LBBB vs prior infarct; no ischemia; EF 53 with septal akinesis.   Hyperlipidemia    Hypertension    Irregular periods/menstrual cycles    LBBB (left bundle branch block)    Melanoma (HCC)    right arm   Seasonal allergies    TIA (transient ischemic attack)    Torn meniscus      Immunization History  Administered Date(s) Administered   Influenza Inj Mdck Quad Pf 10/06/2018, 10/06/2018   Influenza Inj Mdck Quad With Preservative 10/13/2016, 11/05/2017, 12/20/2019   Influenza Split 10/24/2013   Influenza,inj,quad, With Preservative 12/24/2015   Influenza-Unspecified 09/12/2013, 10/13/2015   PFIZER(Purple Top)SARS-COV-2 Vaccination 08/08/2019, 09/05/2019   PPD Test 06/13/2013, 09/13/2015, 10/13/2016, 11/05/2017, 12/20/2019   Pneumococcal Polysaccharide-23 02/12/2004   Td 10/12/2004   Tdap 01/09/2015     Past Surgical History:  Procedure Laterality Date   ABLATION     BREAST REDUCTION SURGERY     CARDIAC CATHETERIZATION     CESAREAN SECTION     x2   KNEE SURGERY     partial knee replacement right   MELANOMA EXCISION WITH SENTINEL LYMPH NODE BIOPSY Right 09/15/2017   Procedure: BIOPSY RIGHT SHOULDER MOLE AND WIDE LOCAL EXCISION OF RIGHT ARM MELANOMA, ADVANCEMENT FLAP CLOSURE, SENTINEL LYMPH NODE BIOPSY  RIGHT ARM MELANOMA;  Surgeon: Almond Lint, MD;  Location: [facility];  Service: General;  Laterality: Right;   REDUCTION MAMMAPLASTY Bilateral    REPAIR QUADRICEPS /  HAMSTRING MUSCLE     REPLACEMENT TOTAL KNEE Left 09/20/2018   Dr. Simeon Craft at [facility]   TONSILLECTOMY     tubes in ears  10/12    FHx:    Reviewed / unchanged  SHx:  Reviewed / unchanged   Systems Review:  Constitutional: Denies fever, chills, wt changes, headaches, insomnia, fatigue, night sweats, change in appetite. Eyes: Denies redness, blurred vision, diplopia, discharge, itchy, watery eyes.  ENT: Denies discharge, congestion, post nasal drip, epistaxis, sore throat, earache, hearing loss, dental pain, tinnitus, vertigo, sinus pain, snoring.  CV: Denies chest pain, palpitations, irregular heartbeat, syncope, dyspnea, diaphoresis, orthopnea, PND, claudication or edema. Respiratory: denies cough, dyspnea, DOE, pleurisy, hoarseness, laryngitis, wheezing.  Gastrointestinal: Denies dysphagia, odynophagia, heartburn, reflux, water brash, abdominal pain or cramps, nausea, vomiting, bloating, diarrhea, constipation, hematemesis, melena, hematochezia  or hemorrhoids. Genitourinary: Denies dysuria, frequency, urgency, nocturia, hesitancy, discharge, hematuria or flank pain. Musculoskeletal: Denies arthralgias, myalgias, stiffness, jt. swelling, pain, limping or strain/sprain.  Skin: Denies pruritus, rash, hives, warts, acne, eczema or change in skin lesion(s). Neuro: No weakness, tremor, incoordination, spasms, paresthesia or pain. Psychiatric: Denies confusion, memory loss or sensory loss. Endo: Denies change in weight, skin or hair change.  Heme/Lymph: No excessive bleeding, bruising or enlarged lymph nodes.  Physical Exam  There were no vitals taken for this visit.  Appears  well nourished, well groomed  and in no distress.  Eyes: PERRLA, EOMs, conjunctiva no swelling or erythema. Sinuses: No frontal/maxillary tenderness ENT/Mouth: EAC's clear, TM's nl w/o erythema, bulging. Nares clear w/o erythema, swelling, exudates. Oropharynx clear without erythema or exudates. Oral hygiene is  good. Tongue normal, non obstructing. Hearing intact.  Neck: Supple. Thyroid not palpable. Car 2+/2+ without bruits, nodes or JVD. Chest: Respirations nl with BS clear & equal w/o rales, rhonchi, wheezing or stridor.  Cor: Heart sounds normal w/ regular rate and rhythm without sig. murmurs, gallops, clicks or rubs. Peripheral pulses normal and equal  without edema.  Abdomen: Soft & bowel sounds normal. Non-tender w/o guarding, rebound, hernias, masses or organomegaly.  Lymphatics: Unremarkable.  Musculoskeletal: Full ROM all peripheral extremities, joint stability, 5/5 strength and normal gait.  Skin: Warm, dry without exposed rashes, lesions or ecchymosis apparent.  Neuro: Cranial nerves intact, reflexes equal bilaterally. Sensory-motor testing grossly intact. Tendon reflexes grossly intact.  Pysch: Alert & oriented x 3.  Insight and judgement nl & appropriate. No ideations.  Assessment and Plan:   1. Essential hypertension  - Continue medication, monitor blood pressure at home.  - Continue DASH diet.  Reminder to go to the ER if any CP,  SOB, nausea, dizziness, severe HA, changes vision/speech.   - CBC with Differential/Platelet - COMPLETE METABOLIC PANEL WITH GFR - Magnesium - TSH   2. Hyperlipidemia, mixed  - Continue diet/meds, exercise,& lifestyle modifications.  - Continue monitor periodic cholesterol/liver & renal functions    - Lipid panel - TSH   3. Abnormal glucose  - Continue diet, exercise  - Lifestyle modifications.  - Monitor appropriate labs   - Hemoglobin A1c - Insulin, random   4. Vitamin D deficiency  - Continue supplementation.   - VITAMIN D 25 Hydroxy   5. Medication management  - CBC with Differential/Platelet - COMPLETE METABOLIC PANEL WITH GFR - Magnesium - Lipid panel - TSH - Hemoglobin A1c - Insulin, random - VITAMIN D 25 Hydroxy          Discussed  regular exercise, BP monitoring, weight control to achieve/maintain BMI less  than 25 and discussed med and SE's. Recommended labs to assess and monitor clinical status with further disposition pending results of labs.  I discussed the assessment and treatment plan with the patient. The patient was provided an opportunity to ask questions and all  were answered. The patient agreed with the plan and demonstrated an understanding of the instructions.  I provided over 30 minutes of exam, counseling, chart review and  complex critical decision making.        The patient was advised to call back or seek an in-person evaluation if the symptoms worsen or if the condition fails to improve as anticipated.   Marinus Maw, MD

## 2022-08-17 NOTE — Patient Instructions (Signed)

## 2022-08-18 ENCOUNTER — Encounter: Payer: Self-pay | Admitting: Internal Medicine

## 2022-08-18 ENCOUNTER — Ambulatory Visit: Admission: RE | Admit: 2022-08-18 | Payer: BC Managed Care – PPO | Source: Ambulatory Visit

## 2022-08-18 ENCOUNTER — Ambulatory Visit (INDEPENDENT_AMBULATORY_CARE_PROVIDER_SITE_OTHER): Payer: BC Managed Care – PPO | Admitting: Internal Medicine

## 2022-08-18 VITALS — BP 124/70 | HR 100 | Temp 97.9°F | Resp 16 | Ht 60.0 in | Wt 140.4 lb

## 2022-08-18 DIAGNOSIS — E559 Vitamin D deficiency, unspecified: Secondary | ICD-10-CM | POA: Diagnosis not present

## 2022-08-18 DIAGNOSIS — R7309 Other abnormal glucose: Secondary | ICD-10-CM

## 2022-08-18 DIAGNOSIS — I10 Essential (primary) hypertension: Secondary | ICD-10-CM

## 2022-08-18 DIAGNOSIS — E782 Mixed hyperlipidemia: Secondary | ICD-10-CM | POA: Diagnosis not present

## 2022-08-18 DIAGNOSIS — Z79899 Other long term (current) drug therapy: Secondary | ICD-10-CM

## 2022-08-19 ENCOUNTER — Ambulatory Visit
Admission: RE | Admit: 2022-08-19 | Discharge: 2022-08-19 | Disposition: A | Discharge status: Home / Self Care | Payer: BC Managed Care – PPO | Source: Ambulatory Visit | Attending: Family Medicine | Admitting: Family Medicine

## 2022-08-19 DIAGNOSIS — M25552 Pain in left hip: Secondary | ICD-10-CM | POA: Diagnosis not present

## 2022-08-19 DIAGNOSIS — M48062 Spinal stenosis, lumbar region with neurogenic claudication: Secondary | ICD-10-CM | POA: Diagnosis not present

## 2022-08-19 DIAGNOSIS — M549 Dorsalgia, unspecified: Secondary | ICD-10-CM

## 2022-08-19 DIAGNOSIS — M545 Low back pain, unspecified: Secondary | ICD-10-CM | POA: Diagnosis not present

## 2022-08-19 DIAGNOSIS — M79605 Pain in left leg: Secondary | ICD-10-CM | POA: Diagnosis not present

## 2022-08-19 MED ORDER — DEXAMETHASONE 4 MG PO TABS: ORAL_TABLET | ORAL | 1 refills | Status: DC

## 2022-08-19 NOTE — Progress Notes (Signed)
^<^<^<^<^<^<^<^<^<^<^<^<^<^<^<^<^<^<^<^<^<^<^<^<^<^<^<^<^<^<^<^<^<^<^<^<^ ^>^>^>^>^>^>^>^>^>^>^>>^>^>^>^>^>^>^>^>^>^>^>^>^>^>^>^>^>^>^>^>^>^>^>^>^>  -  Test results slightly outside the reference range are not unusual. If there is anything important, I will review this with you,  otherwise it is considered normal test values.  If you have further questions,  please do not hesitate to contact me at the office or via My Chart.   ^<^<^<^<^<^<^<^<^<^<^<^<^<^<^<^<^<^<^<^<^<^<^<^<^<^<^<^<^<^<^<^<^<^<^<^<^ ^>^>^>^>^>^>^>^>^>^>^>^>^>^>^>^>^>^>^>^>^>^>^>^>^>^>^>^>^>^>^>^>^>^>^>^>^  -  WBC is elevated -  Likely result of recent Steroid use  ^<^<^<^<^<^<^<^<^<^<^<^<^<^<^<^<^<^<^<^<^<^<^<^<^<^<^<^<^<^<^<^<^<^<^<^<^ ^>^>^>^>^>^>^>^>^>^>^>^>^>^>^>^>^>^>^>^>^>^>^>^>^>^>^>^>^>^>^>^>^>^>^>^>^  -  Random glucose = 167 - elevated ( Normal is less than 100) &  - A1c = 6.$5 - higher in prediabetic range and when it reaches 6.5% or higher,   then considered a "full fledged " Diabetic - So very important to work on a better diet   Also suggest taking Cinnamon which is known to   increase Insulin sensitivity &   therefore decrease Insulin resistance of Diabetes & PreDiabetes   - Recommend "naturebell"  on [employer] Cinnamon                             Dose is 2 capsules = 9,000 mg                              240 capsules  for $16.95   is about $4 / month  !                             If  take 1 capsule Daily, that's an 8 month supply   - Also recommend take Vinegar - 1 ounce in water or other drink 2 x /day                                                                   is also known to lower blood sugar & A1c  !   - Also , Steroids will raise blood sugar,   so please be frugal & judicious with the Dexamethasone    ^<^<^<^<^<^<^<^<^<^<^<^<^<^<^<^<^<^<^<^<^<^<^<^<^<^<^<^<^<^<^<^<^<^<^<^<^ ^>^>^>^>^>^>^>^>^>^>^>^>^>^>^>^>^>^>^>^>^>^>^>^>^>^>^>^>^>^>^>^>^>^>^>^>^  -  Vitamin D = 95       -    Excellent - Please keep dosage same   ^<^<^<^<^<^<^<^<^<^<^<^<^<^<^<^<^<^<^<^<^<^<^<^<^<^<^<^<^<^<^<^<^<^<^<^<^ ^>^>^>^>^>^>^>^>^>^>^>^>^>^>^>^>^>^>^>^>^>^>^>^>^>^>^>^>^>^>^>^>^>^>^>^>^  -  All Else -  Kidneys - Electrolytes - Liver - Magnesium & Thyroid    - all  Normal / OK  ^<^<^<^<^<^<^<^<^<^<^<^<^<^<^<^<^<^<^<^<^<^<^<^<^<^<^<^<^<^<^<^<^<^<^<^<^ ^>^>^>^>^>^>^>^>^>^>^>^>^>^>^>^>^>^>^>^>^>^>^>^>^>^>^>^>^>^>^>^>^>^>^>^>^

## 2022-08-20 ENCOUNTER — Other Ambulatory Visit: Payer: Self-pay | Admitting: Cardiovascular Disease

## 2022-08-25 ENCOUNTER — Encounter: Payer: Self-pay | Admitting: Family Medicine

## 2022-08-25 DIAGNOSIS — G8929 Other chronic pain: Secondary | ICD-10-CM

## 2022-08-30 ENCOUNTER — Other Ambulatory Visit: Payer: Self-pay | Admitting: Internal Medicine

## 2022-08-30 DIAGNOSIS — F411 Generalized anxiety disorder: Secondary | ICD-10-CM

## 2022-08-31 NOTE — Telephone Encounter (Signed)
ordered

## 2022-09-01 ENCOUNTER — Other Ambulatory Visit: Payer: Self-pay | Admitting: Internal Medicine

## 2022-09-01 DIAGNOSIS — F411 Generalized anxiety disorder: Secondary | ICD-10-CM

## 2022-09-02 NOTE — Telephone Encounter (Signed)
Patient notified and given [facility] contact number to schedule.

## 2022-09-04 ENCOUNTER — Other Ambulatory Visit: Payer: Self-pay | Admitting: Nurse Practitioner

## 2022-09-04 ENCOUNTER — Encounter: Payer: Self-pay | Admitting: Physician Assistant

## 2022-09-04 ENCOUNTER — Ambulatory Visit: Payer: BC Managed Care – PPO | Attending: Physician Assistant | Admitting: Physician Assistant

## 2022-09-04 VITALS — BP 143/60 | HR 78 | Ht 60.0 in | Wt 140.6 lb

## 2022-09-04 DIAGNOSIS — E785 Hyperlipidemia, unspecified: Secondary | ICD-10-CM | POA: Diagnosis not present

## 2022-09-04 DIAGNOSIS — I2510 Atherosclerotic heart disease of native coronary artery without angina pectoris: Secondary | ICD-10-CM | POA: Diagnosis not present

## 2022-09-04 DIAGNOSIS — I10 Essential (primary) hypertension: Secondary | ICD-10-CM | POA: Diagnosis not present

## 2022-09-04 NOTE — Progress Notes (Signed)
Cardiology Office Note:  .   Date:  09/04/2022  ID:  Gail Bailey, DOB 10-22-1960, MRN [id number] PCP: Gail Cowboy, MD  [facility] Providers Cardiologist:  Kristeen Miss, MD {  History of Present Illness: .   Gail Bailey is a 62 y.o. female who has been followed for years by Dr. Elease Bailey.  She has a past medical history of anxiety, asthma, cardiomyopathy, headaches, hyperlipidemia, hypertension, and TIA here for follow-up appointment.  History includes TIA follow-up 2016.  She has been seen regular for follow-up about every 6 months.  Carotids were checked at that time which were normal.  Continue to exercise every day.  Also is seeing a neurologist.  She had a cardiac MRI which showed normal ejection fraction however she has had intermittent chest pain for years.  Has had an abnormal Myoview study with anterior defect.  Heart catheterization performed approximately 25 years ago revealed smooth and normal coronary arteries.  It was thought that the Myoview abnormality was likely a breast shadow.  Coronary CT angiogram April 2022 showed  mild proximal disease of the RCA, LM normal, LAD with proximal mid mild to moderate CAD, mild to moderate calcified plaque in D1, and LCx was nondominant with mild disease.   Coronary calcium score 132 which is 93rd percentile for age and sex matched controls.  Lipids were done in December 2022 which showed LDL 85, trig 190.  Melanoma right she had surgery margins were negative.  Did well with surgery.  June of this year she was in the ED for shortness of breath.  She denies history of PE/DVT.  CT scan was ordered without evidence of PE.  She had been using DuoNebs and inhalers without any improvement.  Saw primary care the day prior.  Chest x-ray.  No acute findings on chest x-ray.  Thought to be a component of viral URI and asthma exacerbation.  Today, she tells me she is back on her singular and feeling better.  She went through 2 rounds of  prednisone and 2 rounds of antibiotic.  Recent lipid panel with elevated triglycerides likely due to prednisone.  She has been told by her PCP to take daily apple cider vinegar as well as cinnamon to try to get her A1c and triglycerides down.  Will plan to repeat labs in 3 months.  No chest pains and no palpitation.  Shortness of breath has improved with recent medication changes.  She is having some back pain which is likely why her blood pressure is elevated today.  MRI recently which showed DDD, narrowing of L4-L5 and she will get an epidural on Monday.  We have not made any medication changes today.  Reports no chest pain, pressure, or tightness. No edema, orthopnea, PND. Reports no palpitations.   ROS: Pertinent ROS in HPI  Studies Reviewed: .       Echocardiogram 05/20/2020 IMPRESSIONS     1. Left ventricular ejection fraction, by estimation, is 50 to 55%. The  left ventricle has low normal function. The left ventricle has no regional  wall motion abnormalities. Left ventricular diastolic parameters are  consistent with Grade I diastolic  dysfunction (impaired relaxation). The average left ventricular global  longitudinal strain is -17.5 %. The global longitudinal strain is normal.   2. Right ventricular systolic function is normal. The right ventricular  size is normal.   3. Left atrial size was moderately dilated.   4. The mitral valve is normal in structure. No evidence of mitral valve  regurgitation. No evidence of mitral stenosis.   5. The aortic valve is normal in structure. Aortic valve regurgitation is  not visualized. No aortic stenosis is present.   6. The inferior vena cava is normal in size with greater than 50%  respiratory variability, suggesting right atrial pressure of 3 mmHg.   Comparison(s): No significant change from prior study. Prior images  reviewed side by side.   FINDINGS   Left Ventricle: Left ventricular ejection fraction, by estimation, is 50  to 55%.  The left ventricle has low normal function. The left ventricle has  no regional wall motion abnormalities. The average left ventricular global  longitudinal strain is -17.5  %. The global longitudinal strain is normal. The left ventricular internal  cavity size was normal in size. There is no left ventricular hypertrophy.  Abnormal (paradoxical) septal motion, consistent with left bundle branch  block. Left ventricular  diastolic parameters are consistent with Grade I diastolic dysfunction  (impaired relaxation).   Right Ventricle: The right ventricular size is normal. No increase in  right ventricular wall thickness. Right ventricular systolic function is  normal.   Left Atrium: Left atrial size was moderately dilated.   Right Atrium: Right atrial size was normal in size.   Pericardium: There is no evidence of pericardial effusion.   Mitral Valve: The mitral valve is normal in structure. No evidence of  mitral valve regurgitation. No evidence of mitral valve stenosis.   Tricuspid Valve: The tricuspid valve is normal in structure. Tricuspid  valve regurgitation is not demonstrated. No evidence of tricuspid  stenosis.   Aortic Valve: The aortic valve is normal in structure. Aortic valve  regurgitation is not visualized. No aortic stenosis is present.   Pulmonic Valve: The pulmonic valve was normal in structure. Pulmonic valve  regurgitation is not visualized. No evidence of pulmonic stenosis.   Aorta: The aortic root is normal in size and structure.   Venous: The inferior vena cava is normal in size with greater than 50%  respiratory variability, suggesting right atrial pressure of 3 mmHg.   IAS/Shunts: No atrial level shunt detected by color flow Doppler       Physical Exam:   VS:  BP (!) 143/60   Pulse 78   Ht 5' (1.524 m)   Wt 140 lb 9.6 oz (63.8 kg)   SpO2 97%   BMI 27.46 kg/m    Wt Readings from Last 3 Encounters:  09/04/22 140 lb 9.6 oz (63.8 kg)  08/18/22 140  lb 6.4 oz (63.7 kg)  08/04/22 139 lb (63 kg)    GEN: Well nourished, well developed in no acute distress NECK: No JVD; No carotid bruits CARDIAC: RRR, no murmurs, rubs, gallops RESPIRATORY:  Clear to auscultation without rales, wheezing or rhonchi  ABDOMEN: Soft, non-tender, non-distended EXTREMITIES:  No edema; No deformity   ASSESSMENT AND PLAN: .   1.  Hypertension -Slightly elevated today but patient is in pain due to degenerative disc disease, due for an injection on Monday -Has been well-controlled the last 3 times -Would continue current medication regimen including amlodipine 5 mg daily, aspirin 81 mg daily, Lasix 40 mg daily, metoprolol succinate 12.5 mg daily, Benicar 20 mg daily, Crestor 10 mg daily  2.  Hyperlipidemia -Elevated triglycerides, 323 likely due to 2 rounds of steroids -LDL is 84, HDL is 33, total cholesterol 156 -Plan for repeat lipid panel and LFTs in 3 months with PCP  3.  CAD -No chest pain or change in shortness  of breath -Continue current medications -Continue heart healthy, low-sodium diet -Continue daily activity (he usually gets between 8 and 10K steps a day)      Dispo: She can follow-up in 1 year with Dr. Elease Bailey   Signed, Sharlene Dory, PA-C

## 2022-09-04 NOTE — Discharge Instructions (Signed)

## 2022-09-04 NOTE — Patient Instructions (Addendum)
Medication Instructions:  Your physician recommends that you continue on your current medications as directed. Please refer to the Current Medication list given to you today. *If you need a refill on your cardiac medications before your next appointment, please call your pharmacy*   Lab Work: Complete labs in 3 months with your primary care provider If you have labs (blood work) drawn today and your tests are completely normal, you will receive your results only by: MyChart Message (if you have MyChart) OR A paper copy in the mail If you have any lab test that is abnormal or we need to change your treatment, we will call you to review the results.   Testing/Procedures: None ordered   Follow-Up: At [facility], you and your health needs are our priority.  As part of our continuing mission to provide you with exceptional heart care, we have created designated Provider Care Teams.  These Care Teams include your primary Cardiologist (physician) and Advanced Practice Providers (APPs -  Physician Assistants and Nurse Practitioners) who all work together to provide you with the care you need, when you need it.  We recommend signing up for the patient portal called "MyChart".  Sign up information is provided on this After Visit Summary.  MyChart is used to connect with patients for Virtual Visits (Telemedicine).  Patients are able to view lab/test results, encounter notes, upcoming appointments, etc.  Non-urgent messages can be sent to your provider as well.   To learn more about what you can do with MyChart, go to [email or web address].    Your next appointment:   12 month(s)  Provider:   Kristeen Miss, MD     Other Instructions  Check your blood pressure daily for 2 weeks, then contact the office with your readings.  Make sure to check 2 hours after your medications.   AVOID these things for 30 minutes before checking your blood pressure: No Drinking caffeine. No Drinking  alcohol. No Eating. No Smoking. No Exercising.  Five minutes before checking your blood pressure: Pee. Sit in a dining chair. Avoid sitting in a soft couch or armchair. Be quiet. Do not talk.

## 2022-09-06 ENCOUNTER — Other Ambulatory Visit: Payer: Self-pay | Admitting: Cardiovascular Disease

## 2022-09-07 ENCOUNTER — Ambulatory Visit
Admission: RE | Admit: 2022-09-07 | Discharge: 2022-09-07 | Disposition: A | Discharge status: Home / Self Care | Payer: BC Managed Care – PPO | Source: Ambulatory Visit | Attending: Family Medicine | Admitting: Family Medicine

## 2022-09-07 DIAGNOSIS — M47817 Spondylosis without myelopathy or radiculopathy, lumbosacral region: Secondary | ICD-10-CM | POA: Diagnosis not present

## 2022-09-07 DIAGNOSIS — G8929 Other chronic pain: Secondary | ICD-10-CM

## 2022-09-07 MED ORDER — IOPAMIDOL (ISOVUE-M 200) INJECTION 41%
1.0000 mL | Freq: Once | INTRAMUSCULAR | Status: AC
  Administered 2022-09-07: 1 mL via EPIDURAL

## 2022-09-07 MED ORDER — METHYLPREDNISOLONE ACETATE 40 MG/ML INJ SUSP (RADIOLOG
80.0000 mg | Freq: Once | INTRAMUSCULAR | Status: AC
  Administered 2022-09-07: 80 mg via EPIDURAL

## 2022-09-11 ENCOUNTER — Other Ambulatory Visit: Payer: Self-pay | Admitting: Internal Medicine

## 2022-09-11 DIAGNOSIS — Z79899 Other long term (current) drug therapy: Secondary | ICD-10-CM

## 2022-09-14 ENCOUNTER — Other Ambulatory Visit: Payer: Self-pay | Admitting: Cardiovascular Disease

## 2022-10-11 ENCOUNTER — Other Ambulatory Visit: Payer: Self-pay | Admitting: Cardiovascular Disease

## 2022-10-12 DIAGNOSIS — M9901 Segmental and somatic dysfunction of cervical region: Secondary | ICD-10-CM | POA: Diagnosis not present

## 2022-10-27 ENCOUNTER — Ambulatory Visit: Payer: BC Managed Care – PPO | Admitting: Nurse Practitioner

## 2022-11-18 ENCOUNTER — Encounter: Payer: Self-pay | Admitting: Gastroenterology

## 2022-11-25 ENCOUNTER — Ambulatory Visit: Payer: BC Managed Care – PPO | Admitting: Nurse Practitioner

## 2022-11-30 ENCOUNTER — Ambulatory Visit (INDEPENDENT_AMBULATORY_CARE_PROVIDER_SITE_OTHER): Payer: BC Managed Care – PPO | Admitting: Nurse Practitioner

## 2022-11-30 ENCOUNTER — Encounter: Payer: Self-pay | Admitting: Nurse Practitioner

## 2022-11-30 VITALS — BP 110/80 | HR 56 | Temp 97.6°F | Ht 60.0 in | Wt 137.8 lb

## 2022-11-30 DIAGNOSIS — G478 Other sleep disorders: Secondary | ICD-10-CM

## 2022-11-30 DIAGNOSIS — N951 Menopausal and female climacteric states: Secondary | ICD-10-CM

## 2022-11-30 DIAGNOSIS — I10 Essential (primary) hypertension: Secondary | ICD-10-CM

## 2022-11-30 DIAGNOSIS — L2481 Irritant contact dermatitis due to metals: Secondary | ICD-10-CM

## 2022-11-30 DIAGNOSIS — F411 Generalized anxiety disorder: Secondary | ICD-10-CM | POA: Diagnosis not present

## 2022-11-30 DIAGNOSIS — Z23 Encounter for immunization: Secondary | ICD-10-CM | POA: Diagnosis not present

## 2022-11-30 DIAGNOSIS — R7309 Other abnormal glucose: Secondary | ICD-10-CM

## 2022-11-30 DIAGNOSIS — E559 Vitamin D deficiency, unspecified: Secondary | ICD-10-CM

## 2022-11-30 DIAGNOSIS — E782 Mixed hyperlipidemia: Secondary | ICD-10-CM | POA: Diagnosis not present

## 2022-11-30 DIAGNOSIS — Z79899 Other long term (current) drug therapy: Secondary | ICD-10-CM

## 2022-11-30 DIAGNOSIS — E538 Deficiency of other specified B group vitamins: Secondary | ICD-10-CM

## 2022-11-30 MED ORDER — TRIAMCINOLONE ACETONIDE 0.025 % EX OINT: 1.0000 | TOPICAL_OINTMENT | Freq: Two times a day (BID) | CUTANEOUS | 0 refills | Status: AC

## 2022-11-30 NOTE — Progress Notes (Signed)
Assessment and Plan:  Gail Bailey was seen today for a general follow up.  Diagnoses and all orders for this visit:  Essential hypertension Controlled - Continue Amlodipine, ASA, Metoprolol, Benicar, Lasix, K+ Discussed DASH (Dietary Approaches to Stop Hypertension) DASH diet is lower in sodium than a typical American diet. Cut back on foods that are high in saturated fat, cholesterol, and trans fats. Eat more whole-grain foods, fish, poultry, and nuts Remain active and exercise as tolerated daily.  Monitor BP at home-Call if greater than 130/80.  Check CMP/CBC  Generalized anxiety disorder Continue Buspar, Lexapro.   Reviewed relaxation techniques.  Sleep hygiene. Recommended Cognitive Behavioral Therapy (CBT). Recommended mindfulness meditation and exercise.   Insight-oriented psychotherapy given for 16 minutes exclusively. Psychoeducation:  encouraged personality growth wand development through coping techniques and problem-solving skills. Limit/Decrease/Monitor drug/alcohol intake.    Hyperlipidemia, mixed/Coronary artery disease involving native coronary artery of native heart without angina pectoris Elevated triglycerides Continue Rosuvastatin, Fish Oil Discussed lifestyle modifications. Recommended diet heavy in fruits and veggies, omega 3's. Decrease consumption of animal meats, cheeses, and dairy products. Remain active and exercise as tolerated. Continue to monitor. Defer lipids check today per patient  Abnormal glucose Education: Reviewed 'ABCs' of diabetes management  Discussed goals to be met and/or maintained include A1C (<7) Blood pressure (<130/80) Cholesterol (LDL <70) Continue Eye Exam yearly  Continue Dental Exam Q6 mo Discussed dietary recommendations Discussed Physical Activity recommendations Check A1C  Vitamin D deficiency Continue supplement for goal of 60-100 Monitor Vitamin D levels  B12 deficiency Continue supplement  Non-restorative  sleep/hot flashes/sweats/low libido Continue Estradial  Secondary to menopausal state Discussed benefit of medication Veozah and Addy. Patient requests to research Continue to monitor  Orders Placed This Encounter  Procedures   Fluzone Trivalent Flu Vaccine (Muli dose preparattion)   CBC with Differential/Platelet   COMPLETE METABOLIC PANEL WITH GFR   Lipid panel   Hemoglobin A1c    Notify office for further evaluation and treatment, questions or concerns if any reported s/s fail to improve.   The patient was advised to call back or seek an in-person evaluation if any symptoms worsen or if the condition fails to improve as anticipated.   Further disposition pending results of labs. Discussed med's effects and SE's.    I discussed the assessment and treatment plan with the patient. The patient was provided an opportunity to ask questions and all were answered. The patient agreed with the plan and demonstrated an understanding of the instructions.  Discussed med's effects and SE's. Screening labs and tests as requested with regular follow-up as recommended.  I provided 30 minutes of face-to-face time during this encounter including counseling, chart review, and critical decision making was preformed.  Today's Plan of Care is based on a patient-centered health care approach known as shared decision making - the decisions, tests and treatments allow for patient preferences and values to be balanced with clinical evidence.     Future Appointments  Date Time Provider Department Center  03/02/2023  2:00 PM Lucky Cowboy, MD [facility] None    ------------------------------------------------------------------------------------------------------------------   HPI BP 110/80   Pulse (!) 56   Temp 97.6 F (36.4 C)   Ht 5' (1.524 m)   Wt 137 lb 12.8 oz (62.5 kg)   SpO2 99%   BMI 26.91 kg/m   62 y.o.female presents for general follow up.  Overall she reports feeling well  today.  She has noticed a pruritic rash on the left ring finger where she  wears her wedding rings.  Occurred over the last few days.  Thought to be triggered by lotion she used while traveling.  She has tried OTC Neosporin without relief.    She continues to take HRT Estradiol daily.  Continues to have insomnia, low libido and hot flashes.  Has take black cohosh in the past without benefit.  She did have have placement of bio-identical testosterone hormone pellet replacement in that area to the lower back several months ago, does not wish to proceed forward.  BP has been well controlled.  She denies any CP, heart palpitations, SOB. BP Readings from Last 3 Encounters:  11/30/22 110/80  09/07/22 102/65  09/04/22 (!) 143/60   Continues to have stable mood with low anxiety.  On Lexapro and Buspar with benefit.    She is on cholesterol medication Rosuvastatin and takes omega 3 fish ol and denies myalgias. Her cholesterol is not at goal with elevated triglycerides. The cholesterol last visit was:   Lab Results  Component Value Date   CHOL 156 08/18/2022   HDL 33 (L) 08/18/2022   LDLCALC 84 08/18/2022   TRIG 323 (H) 08/18/2022   CHOLHDL 4.7 08/18/2022   She takes daily Vitamin D supplement. Last vitamin D Lab Results  Component Value Date   VD25OH 95 08/18/2022   She takes a daily B12 supplement.  Last level was elevated due to recent injection before lab draw. Lab Results  Component Value Date   VITAMINB12 1,348 (H) 01/14/2022       Past Medical History:  Diagnosis Date   Allergy    Anxiety    Asthma    CAD (coronary artery disease)    Cardiomyopathy (HCC)    a. Echo 8/16:  EF 40-45%, apical and ant-septal HK, Gr 2 DD   Diverticula, colon    Family history of adverse reaction to anesthesia    sisiter had PONV   GAD (generalized anxiety disorder)    Headache    rare migraines   History of cardiovascular stress test    Myoview 8/16:  EF 48%, anterior and apical defect c/w  breast atten, no ischemia; Low Risk // Nuclear stress test 9/18: Low risk stress nuclear study with fixed septal defect consistent with LBBB vs prior infarct; no ischemia; EF 53 with septal akinesis.   Hyperlipidemia    Hypertension    Irregular periods/menstrual cycles    LBBB (left bundle branch block)    Melanoma (HCC)    right arm   Seasonal allergies    TIA (transient ischemic attack)    Torn meniscus    Vitamin B12 deficiency    Vitamin D deficiency      Allergies  Allergen Reactions   Metoprolol     Not allergy, but intolerance - had bradycardia with HR in the 40s even with low dose   Oxycodone Itching    Itching: Trunk, palms of hands, legs, back      Current Outpatient Medications on File Prior to Visit  Medication Sig   acyclovir ointment (ZOVIRAX) 5 % Apply 1 application topically every 3 (three) hours.   Adapalene-Benzoyl Peroxide (EPIDUO) 0.1-2.5 % gel Apply 1 application topically as needed (acne).   albuterol (VENTOLIN HFA) 108 (90 Base) MCG/ACT inhaler INHALE 2 PUFFS INTO LUNGS EVERY 4 HOURS AS NEEDED FOR WHEEZING OR SHORTNESS OF BREATH   amLODipine (NORVASC) 5 MG tablet TAKE 1 TABLET BY MOUTH EVERY DAY FOR BLOOD PRESSURE   aspirin EC 81 MG tablet Take 1 tablet (  81 mg total) by mouth daily.   Cholecalciferol (VITAMIN D PO) Take 5,000 Units by mouth daily.    dexamethasone (DECADRON) 4 MG tablet Take 1 tab 3 x day - 3 days, then 2 x day - 3 days, then 1 tab daily   escitalopram (LEXAPRO) 20 MG tablet TAKE 1 TABLET DAILY FOR MOOD & ANXIETY   esomeprazole (NEXIUM) 40 MG capsule Take 1 capsule (40 mg total) by mouth every other day. Taper off after 2 weeks of every other day. Start Pepcid 20 mg twice a day   estradiol (ESTRACE) 0.5 MG tablet Take 0.5 mg by mouth daily.   famotidine (PEPCID) 20 MG tablet TAKE 1 TABLET BY MOUTH TWICE A DAY   fluticasone (FLONASE) 50 MCG/ACT nasal spray Place 2 sprays into both nostrils as needed for allergies or rhinitis.    Fluticasone-Umeclidin-Vilant (TRELEGY ELLIPTA) 200-62.5-25 MCG/ACT AEPB Inhale 1 Inhalation into the lungs as needed (asthma flareup).   furosemide (LASIX) 40 MG tablet TAKE 1 TABLET EVERY DAY FOR BLOOD PRESSURE AND FLUID   hydrocortisone 2.5 % cream APPLY DAILY TO SKIN TO AFFECTED AREA TWICE A DAY FOR 2 WEEKS   ipratropium-albuterol (DUONEB) 0.5-2.5 (3) MG/3ML SOLN Take 3 mLs by nebulization every 4 (four) hours as needed.   loperamide (IMODIUM A-D) 2 MG tablet Take 1 tablet (2 mg total) by mouth daily. Titrate as needed   medroxyPROGESTERone (PROVERA) 2.5 MG tablet Take 2.5 mg by mouth daily.   metoprolol succinate (TOPROL-XL) 25 MG 24 hr tablet Take 0.5 tablets (12.5 mg total) by mouth daily.   montelukast (SINGULAIR) 10 MG tablet Take 1 tablet daily for Allergies & Asthma   olmesartan (BENICAR) 20 MG tablet TAKE 1 TABLET DAILY. PLEASE CONTACT THE OFFICE FOR ADDITIONAL REFILLS (FIRST ATTEMPT)   Omega-3 Fatty Acids (FISH OIL PO) Take 1 capsule by mouth daily.   potassium chloride (KLOR-CON) 10 MEQ tablet TAKE 1 TABLET BY MOUTH 2 TIMES DAILY.   rosuvastatin (CRESTOR) 10 MG tablet TAKE 1 TABLET BY MOUTH EVERY DAY   topiramate (TOPAMAX) 50 MG tablet TAKE 1 TABLET 2 TIMES DAILY AT SUPPERTIME & BEDTIME FOR DIETING & WEIGHT LOSS   valACYclovir (VALTREX) 500 MG tablet Take  1 tablet  2 x /day as needed for Viral Suppression   No current facility-administered medications on file prior to visit.    ROS: all negative except what is noted in the HPI.   Physical Exam:  BP 110/80   Pulse (!) 56   Temp 97.6 F (36.4 C)   Ht 5' (1.524 m)   Wt 137 lb 12.8 oz (62.5 kg)   SpO2 99%   BMI 26.91 kg/m   General Appearance: NAD.  Awake, conversant and cooperative. Eyes: PERRLA, EOMs intact.  Sclera white.  Conjunctiva without erythema. Sinuses: No frontal/maxillary tenderness.  No nasal discharge. Nares patent.  ENT/Mouth: Ext aud canals clear.  Bilateral TMs w/DOL and without erythema or bulging.  Hearing intact.  Posterior pharynx without swelling or exudate.  Tonsils without swelling or erythema.  Neck: Supple.  No masses, nodules or thyromegaly. Respiratory: Effort is regular with non-labored breathing. Breath sounds are equal bilaterally without rales, rhonchi, wheezing or stridor.  Cardio: RRR with no MRGs. Brisk peripheral pulses without edema.  Abdomen: Active BS in all four quadrants.  Soft and non-tender without guarding, rebound tenderness, hernias or masses. Lymphatics: Non tender without lymphadenopathy.  Musculoskeletal:Full ROM, 5/5 strength, normal ambulation.  Skin: Appropriate color for ethnicity. Warm without rashes, lesions, ecchymosis, ulcers.  Neuro: CN II-XII grossly normal. Normal muscle tone without cerebellar symptoms and intact sensation.   Psych: AO X 3,  appropriate mood and affect, insight and judgment.     Adela Glimpse, NP 1:06 PM [facility]

## 2022-11-30 NOTE — Patient Instructions (Signed)
 Contact Dermatitis Dermatitis is when your skin becomes red, sore, and swollen.  Contact dermatitis happens when your body reacts to something that touches the skin. There are 2 types: Irritant contact dermatitis. This is when something bothers your skin, like soap. Allergic contact dermatitis. This is when your skin touches something you are allergic to, like poison ivy. What are the causes? Irritant contact dermatitis may be caused by: Makeup. Soaps. Detergents. Bleaches. Acids. Metals, like nickel. Allergic contact dermatitis may be caused by: Plants. Chemicals. Jewelry. Latex. Medicines. Preservatives. These are things added to products to help them last longer. There may be some in your clothes. What increases the risk? Having a job where you have to be near things that bother your skin. Having asthma or eczema. What are the signs or symptoms?  Dry or flaky skin. Redness. Cracks. Itching. Moderate symptoms of this condition include: Pain or a burning feeling. Blisters. Blood or clear fluid coming from cracks in your skin. Swelling. This may be on your eyelids, mouth, or genitals. How is this treated? Your doctor will find out what is making your skin react. Then, you can protect your skin. You may need to use: Steroid creams, ointments, or medicines. Antibiotics or other ointments, if you have a skin infection. Lotion or medicines to help with itching. A bandage. Follow these instructions at home: Skin care Put moisturizer on your skin when it needs it. Put cool, wet cloths on your skin (cool compresses). Put a baking soda paste on your skin. Stir water into baking soda until it looks like a paste. [place] not scratch your skin. Try not to have things rub up against your skin. Avoid tight clothing. Avoid using soaps, perfumes, and dyes. Check your skin every day for signs of infection. Check for: More redness, swelling, or pain. More fluid or blood. Warmth. Pus or  a bad smell. Medicines Take or apply over-the-counter and prescription medicines only as told by your doctor. If you were prescribed antibiotics, take or apply them as told by your doctor. [place] not stop using them even if you start to feel better. Bathing Take a bath with: Epsom salts. Baking soda. Colloidal oatmeal. Bathe less often. Bathe in warm water. Try not to use hot water. Bandage care If you were given a bandage, change it as told by your doctor. Wash your hands with soap and water for at least 20 seconds before and after you change your bandage. If you cannot use soap and water, use hand sanitizer. General instructions Avoid the things that caused your reaction. If you don't know what caused it, keep a journal. Write down: What you eat. What skin products you use. What you drink. What you wear. Contact a doctor if: You [place] not get better with treatment. You get worse. You have signs of infection. You have a fever. You have new symptoms. Your bone or joint near the area hurts after the skin has healed. Get help right away if: You [place] red streaks coming from the area. The area turns darker. You have trouble breathing. This information is not intended to replace advice given to you by your health care provider. Make sure you discuss any questions you have with your health care provider. Document Revised: 07/04/2021 Document Reviewed: 07/04/2021 Elsevier Patient Education  2024 [employer].

## 2022-12-01 LAB — LIPID PANEL
Cholesterol: 133 mg/dL (ref <200)
HDL: 37 mg/dL — ABNORMAL LOW (ref >=50)
LDL Cholesterol (Calc): 64 mg/dL
Non-HDL Cholesterol (Calc): 96 mg/dL (ref <130)
Total CHOL/HDL Ratio: 3.6 (calc) (ref <5.0)
Triglycerides: 276 mg/dL — ABNORMAL HIGH (ref <150)

## 2022-12-01 LAB — CBC WITH DIFFERENTIAL/PLATELET
Absolute Lymphocytes: 2048 {cells}/uL (ref 850–3900)
Absolute Monocytes: 496 {cells}/uL (ref 200–950)
Basophils Absolute: 56 {cells}/uL (ref 0–200)
Basophils Relative: 0.7 %
Eosinophils Absolute: 120 {cells}/uL (ref 15–500)
Eosinophils Relative: 1.5 %
HCT: 45 % (ref 35.0–45.0)
Hemoglobin: 14.8 g/dL (ref 11.7–15.5)
MCH: 30.5 pg (ref 27.0–33.0)
MCHC: 32.9 g/dL (ref 32.0–36.0)
MCV: 92.6 fL (ref 80.0–100.0)
MPV: 12.5 fL (ref 7.5–12.5)
Monocytes Relative: 6.2 %
Neutro Abs: 5280 {cells}/uL (ref 1500–7800)
Neutrophils Relative %: 66 %
Platelets: 325 10*3/uL (ref 140–400)
RBC: 4.86 10*6/uL (ref 3.80–5.10)
RDW: 12.6 % (ref 11.0–15.0)
Total Lymphocyte: 25.6 %
WBC: 8 10*3/uL (ref 3.8–10.8)

## 2022-12-01 LAB — COMPLETE METABOLIC PANEL WITH GFR
AG Ratio: 1.9 (calc) (ref 1.0–2.5)
ALT: 16 U/L (ref 6–29)
AST: 16 U/L (ref 10–35)
Albumin: 4.3 g/dL (ref 3.6–5.1)
Alkaline phosphatase (APISO): 73 U/L (ref 37–153)
BUN: 13 mg/dL (ref 7–25)
CO2: 26 mmol/L (ref 20–32)
Calcium: 9.1 mg/dL (ref 8.6–10.4)
Chloride: 104 mmol/L (ref 98–110)
Creat: 0.79 mg/dL (ref 0.50–1.05)
Globulin: 2.3 g/dL (ref 1.9–3.7)
Glucose, Bld: 108 mg/dL — ABNORMAL HIGH (ref 65–99)
Potassium: 3.9 mmol/L (ref 3.5–5.3)
Sodium: 139 mmol/L (ref 135–146)
Total Bilirubin: 0.4 mg/dL (ref 0.2–1.2)
Total Protein: 6.6 g/dL (ref 6.1–8.1)
eGFR: 85 mL/min/{1.73_m2} (ref >=60)

## 2022-12-01 LAB — HEMOGLOBIN A1C
Hgb A1c MFr Bld: 6.1 %{Hb} — ABNORMAL HIGH (ref <5.7)
Mean Plasma Glucose: 128 mg/dL
eAG (mmol/L): 7.1 mmol/L

## 2022-12-14 DIAGNOSIS — M9901 Segmental and somatic dysfunction of cervical region: Secondary | ICD-10-CM | POA: Diagnosis not present

## 2023-01-15 ENCOUNTER — Encounter: Payer: BC Managed Care – PPO | Admitting: Internal Medicine

## 2023-01-31 ENCOUNTER — Other Ambulatory Visit: Payer: Self-pay | Admitting: Gastroenterology

## 2023-02-27 ENCOUNTER — Other Ambulatory Visit: Payer: Self-pay | Admitting: Cardiovascular Disease

## 2023-03-01 DIAGNOSIS — M9901 Segmental and somatic dysfunction of cervical region: Secondary | ICD-10-CM | POA: Diagnosis not present

## 2023-03-02 ENCOUNTER — Encounter: Payer: BC Managed Care – PPO | Admitting: Internal Medicine

## 2023-03-04 ENCOUNTER — Other Ambulatory Visit: Payer: Self-pay | Admitting: Cardiovascular Disease

## 2023-03-04 ENCOUNTER — Other Ambulatory Visit: Payer: Self-pay

## 2023-03-04 DIAGNOSIS — F411 Generalized anxiety disorder: Secondary | ICD-10-CM

## 2023-03-04 MED ORDER — ESCITALOPRAM OXALATE 20 MG PO TABS: ORAL_TABLET | ORAL | 0 refills | Status: DC

## 2023-04-07 DIAGNOSIS — Z1231 Encounter for screening mammogram for malignant neoplasm of breast: Secondary | ICD-10-CM | POA: Diagnosis not present

## 2023-04-07 DIAGNOSIS — Z01419 Encounter for gynecological examination (general) (routine) without abnormal findings: Secondary | ICD-10-CM | POA: Diagnosis not present

## 2023-04-12 ENCOUNTER — Other Ambulatory Visit: Payer: Self-pay | Admitting: Obstetrics & Gynecology

## 2023-04-12 DIAGNOSIS — R928 Other abnormal and inconclusive findings on diagnostic imaging of breast: Secondary | ICD-10-CM

## 2023-04-19 ENCOUNTER — Ambulatory Visit
Admission: RE | Admit: 2023-04-19 | Discharge: 2023-04-19 | Disposition: A | Discharge status: Home / Self Care | Source: Ambulatory Visit | Attending: Obstetrics & Gynecology | Admitting: Obstetrics & Gynecology

## 2023-04-19 ENCOUNTER — Other Ambulatory Visit: Payer: Self-pay | Admitting: Obstetrics & Gynecology

## 2023-04-19 DIAGNOSIS — R928 Other abnormal and inconclusive findings on diagnostic imaging of breast: Secondary | ICD-10-CM

## 2023-04-19 DIAGNOSIS — N6320 Unspecified lump in the left breast, unspecified quadrant: Secondary | ICD-10-CM

## 2023-04-19 DIAGNOSIS — N6323 Unspecified lump in the left breast, lower outer quadrant: Secondary | ICD-10-CM | POA: Diagnosis not present

## 2023-04-19 DIAGNOSIS — N6002 Solitary cyst of left breast: Secondary | ICD-10-CM | POA: Diagnosis not present

## 2023-04-20 ENCOUNTER — Other Ambulatory Visit: Payer: Self-pay | Admitting: Obstetrics & Gynecology

## 2023-04-20 DIAGNOSIS — R928 Other abnormal and inconclusive findings on diagnostic imaging of breast: Secondary | ICD-10-CM

## 2023-04-27 ENCOUNTER — Other Ambulatory Visit: Payer: Self-pay | Admitting: Obstetrics & Gynecology

## 2023-04-27 ENCOUNTER — Ambulatory Visit
Admission: RE | Admit: 2023-04-27 | Discharge: 2023-04-27 | Disposition: A | Discharge status: Home / Self Care | Source: Ambulatory Visit | Attending: Obstetrics & Gynecology | Admitting: Obstetrics & Gynecology

## 2023-04-27 DIAGNOSIS — R928 Other abnormal and inconclusive findings on diagnostic imaging of breast: Secondary | ICD-10-CM

## 2023-04-27 DIAGNOSIS — N6002 Solitary cyst of left breast: Secondary | ICD-10-CM | POA: Diagnosis not present

## 2023-05-03 ENCOUNTER — Telehealth: Payer: Self-pay | Admitting: *Deleted

## 2023-05-03 ENCOUNTER — Encounter: Payer: Self-pay | Admitting: Physician Assistant

## 2023-05-03 ENCOUNTER — Ambulatory Visit: Payer: BC Managed Care – PPO | Admitting: Physician Assistant

## 2023-05-03 VITALS — BP 136/88 | HR 65 | Temp 97.7°F | Ht 60.25 in | Wt 140.2 lb

## 2023-05-03 DIAGNOSIS — R519 Headache, unspecified: Secondary | ICD-10-CM

## 2023-05-03 DIAGNOSIS — I10 Essential (primary) hypertension: Secondary | ICD-10-CM

## 2023-05-03 DIAGNOSIS — Z0000 Encounter for general adult medical examination without abnormal findings: Secondary | ICD-10-CM | POA: Diagnosis not present

## 2023-05-03 DIAGNOSIS — E782 Mixed hyperlipidemia: Secondary | ICD-10-CM | POA: Diagnosis not present

## 2023-05-03 DIAGNOSIS — E559 Vitamin D deficiency, unspecified: Secondary | ICD-10-CM

## 2023-05-03 DIAGNOSIS — E663 Overweight: Secondary | ICD-10-CM | POA: Diagnosis not present

## 2023-05-03 DIAGNOSIS — G8929 Other chronic pain: Secondary | ICD-10-CM

## 2023-05-03 DIAGNOSIS — Z79890 Hormone replacement therapy (postmenopausal): Secondary | ICD-10-CM

## 2023-05-03 DIAGNOSIS — K52832 Lymphocytic colitis: Secondary | ICD-10-CM

## 2023-05-03 DIAGNOSIS — I5022 Chronic systolic (congestive) heart failure: Secondary | ICD-10-CM

## 2023-05-03 DIAGNOSIS — E88819 Insulin resistance, unspecified: Secondary | ICD-10-CM

## 2023-05-03 DIAGNOSIS — J4520 Mild intermittent asthma, uncomplicated: Secondary | ICD-10-CM

## 2023-05-03 DIAGNOSIS — K219 Gastro-esophageal reflux disease without esophagitis: Secondary | ICD-10-CM

## 2023-05-03 DIAGNOSIS — F411 Generalized anxiety disorder: Secondary | ICD-10-CM

## 2023-05-03 LAB — LIPID PANEL
Cholesterol: 129 mg/dL (ref 0–200)
HDL: 34.5 mg/dL — ABNORMAL LOW (ref >39.00)
LDL Cholesterol: 51 mg/dL (ref 0–99)
NonHDL: 94.29
Total CHOL/HDL Ratio: 4
Triglycerides: 214 mg/dL — ABNORMAL HIGH (ref 0.0–149.0)
VLDL: 42.8 mg/dL — ABNORMAL HIGH (ref 0.0–40.0)

## 2023-05-03 LAB — VITAMIN D 25 HYDROXY (VIT D DEFICIENCY, FRACTURES): VITD: >120 ng/mL

## 2023-05-03 LAB — CBC WITH DIFFERENTIAL/PLATELET
Basophils Absolute: 0.1 10*3/uL (ref 0.0–0.1)
Basophils Relative: 1.1 % (ref 0.0–3.0)
Eosinophils Absolute: 0.2 10*3/uL (ref 0.0–0.7)
Eosinophils Relative: 2.4 % (ref 0.0–5.0)
HCT: 43.8 % (ref 36.0–46.0)
Hemoglobin: 14.4 g/dL (ref 12.0–15.0)
Lymphocytes Relative: 22.2 % (ref 12.0–46.0)
Lymphs Abs: 1.8 10*3/uL (ref 0.7–4.0)
MCHC: 33 g/dL (ref 30.0–36.0)
MCV: 91.4 fl (ref 78.0–100.0)
Monocytes Absolute: 0.7 10*3/uL (ref 0.1–1.0)
Monocytes Relative: 8 % (ref 3.0–12.0)
Neutro Abs: 5.4 10*3/uL (ref 1.4–7.7)
Neutrophils Relative %: 66.3 % (ref 43.0–77.0)
Platelets: 311 10*3/uL (ref 150.0–400.0)
RBC: 4.79 Mil/uL (ref 3.87–5.11)
RDW: 13.6 % (ref 11.5–15.5)
WBC: 8.2 10*3/uL (ref 4.0–10.5)

## 2023-05-03 LAB — COMPREHENSIVE METABOLIC PANEL WITH GFR
ALT: 16 U/L (ref 0–35)
AST: 18 U/L (ref 0–37)
Albumin: 4.2 g/dL (ref 3.5–5.2)
Alkaline Phosphatase: 61 U/L (ref 39–117)
BUN: 15 mg/dL (ref 6–23)
CO2: 26 meq/L (ref 19–32)
Calcium: 9 mg/dL (ref 8.4–10.5)
Chloride: 103 meq/L (ref 96–112)
Creatinine, Ser: 0.88 mg/dL (ref 0.40–1.20)
GFR: 70.48 mL/min (ref >60.00)
Glucose, Bld: 100 mg/dL — ABNORMAL HIGH (ref 70–99)
Potassium: 3.8 meq/L (ref 3.5–5.1)
Sodium: 137 meq/L (ref 135–145)
Total Bilirubin: 0.4 mg/dL (ref 0.2–1.2)
Total Protein: 6.6 g/dL (ref 6.0–8.3)

## 2023-05-03 LAB — HEMOGLOBIN A1C: Hgb A1c MFr Bld: 6 % (ref 4.6–6.5)

## 2023-05-03 MED ORDER — PANTOPRAZOLE SODIUM 40 MG PO TBEC: 40.0000 mg | DELAYED_RELEASE_TABLET | Freq: Every day | ORAL | 0 refills | Status: DC

## 2023-05-03 NOTE — Progress Notes (Signed)
 Subjective:    Gail Bailey is a 63 y.o. female and is here to establish care and for a comprehensive physical exam.  HPI  Health Maintenance Due  Topic Date Due   Pneumococcal Vaccine 59-42 Years old (2 of 2 - PCV) 02/11/2005   Cervical Cancer Screening (HPV/Pap Cotest)  05/19/2019   Relevant medical history: Complex migraine / TIA Pt reports an episode of difficulty speaking in 2016. She attributes this episode to stress and activity with positional changes while dealing with wedding preparations at the time.  She states this episode was classified as a TIA when hospitalized.  She was established with Dr. Cassondra Cliff at the time and states it was believed this episode may have been a complex migraine, given her hx of migraines.  She did not follow up with neurology following this episode for further evaluation.   Lymphocytic Colitis Pt has a hx of lymphocytic colitis.  She was initially prescribed Budesonide , which helped improve her symptoms at the time. Afterwards, her symptoms returned with frequent episodes of diarrhea.  Pt had colonoscopy in 2023 and reports also having an endoscopy - repeat 10 years.   Chronic Issues: GERD She is currently on Pepcid  20 mg BID and Imodium  A-D 2 mg daily to manage her GERD and heartburn.  She endorses heartburn, stating her pepcid  has not helped much.  She is receptive to trying another medication to manage her heartburn.   HTN; CHF Pt is on Amlodipine  5 mg once daily, metoprolol  12.5 mg xl daily and Olmesartan  20 mg once daily  Good compliance and tolerance to current antihypertensive regimen.  Takes Lasix  40 mg daily Last echocardiogram May 2022 States her blood pressures have been well controlled.  No acute concerns reported today.   HLD Pt is on Crestor  10 mg once daily; good compliance and tolerance. She avoids high sodium foods and sugary beverages.   Moods / Anxiety Pt is on Lexapro  20 mg once daily with good tolerance.  She  started on Lexapro  after her mother passed in 2018.  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  Was previously on an anti-depressant after her best friend passed, but discontinued due to it being "too sedating".  Overall her moods are currently stable with no acute concerns.   Menopause Pt is on estradiol  once daily with good tolerance.  Her menopausal symptoms have improved, currently only experiences night sweats.  She is established with OB/Gyn.   Health Maintenance: Immunizations -- See above.  Colonoscopy -- UTD, last done 11/28/2021. Diverticulosis, internal hemorrhoids, otherwise normal. Biopsy done for evaluation of microscopic colitis. Repeat 10 years.  Mammogram -- Last done 04/27/2023. Results were abnormal, mass on left breast.  PAP -- Per EHR, last done 05/18/2016.  Bone Density -- N/a Diet -- Overall well balanced diet. Tends to eat fish, chicken, vegetables. Does not drink juices of sodas.  Exercise -- 4-5K steps daily. States this was previously around 10K steps daily.   Sleep habits -- No concerns today. Had sleep study done, no OSA.  Mood -- Stable.    Weight history: Wt Readings from Last 10 Encounters:  05/03/23 140 lb 4 oz (63.6 kg)  11/30/22 137 lb 12.8 oz (62.5 kg)  09/04/22 140 lb 9.6 oz (63.8 kg)  08/18/22 140 lb 6.4 oz (63.7 kg)  08/04/22 139 lb (63 kg)  07/07/22 135 lb (61.2 kg)  07/06/22 136 lb (61.7 kg)  07/02/22 136 lb 9.6 oz (62 kg)  06/18/22  136 lb 4 oz (61.8 kg)  04/30/22 136 lb 12.8 oz (62.1 kg)   Body mass index is 27.16 kg/m. No LMP recorded. Patient has had an ablation.  Alcohol use:  reports current alcohol use of about 3.0 - 4.0 standard drinks of alcohol per week.  Tobacco use:  Tobacco Use: Low Risk  (05/03/2023)   Patient History    Smoking Tobacco Use: Never    Smokeless Tobacco Use: Never    Passive Exposure: Not on file   Eligible for lung cancer screening? no     05/03/2023    8:08 AM   Depression screen PHQ 2/9  Decreased Interest 0  Down, Depressed, Hopeless 0  PHQ - 2 Score 0     Other providers/specialists: Patient Care Team: Alexander Iba, [place] as PCP - General (Physician Assistant) Nahser, Lela Purple, MD as PCP - Cardiology (Cardiology) Claudette Cue, MD (Inactive) as Consulting Physician (Gastroenterology)    PMHx, SurgHx, SocialHx, Medications, and Allergies were reviewed in the Visit Navigator and updated as appropriate.   Past Medical History:  Diagnosis Date   Allergy    Anxiety    Asthma    CAD (coronary artery disease)    Cardiomyopathy (HCC)    a. Echo 8/16:  EF 40-45%, apical and ant-septal HK, Gr 2 DD   Diverticula, colon    Family history of adverse reaction to anesthesia    sisiter had PONV   GAD (generalized anxiety disorder)    Headache    rare migraines   History of cardiovascular stress test    Myoview  8/16:  EF 48%, anterior and apical defect c/w breast atten, no ischemia; Low Risk // Nuclear stress test 9/18: Low risk stress nuclear study with fixed septal defect consistent with LBBB vs prior infarct; no ischemia; EF 53 with septal akinesis.   Hyperlipidemia    Hypertension    Irregular periods/menstrual cycles    LBBB (left bundle branch block)    Melanoma (HCC)    right arm   Seasonal allergies    TIA (transient ischemic attack)    Torn meniscus    Vitamin B12 deficiency    Vitamin D  deficiency      Past Surgical History:  Procedure Laterality Date   ABLATION     BREAST REDUCTION SURGERY     CARDIAC CATHETERIZATION     CESAREAN SECTION     x2   KNEE SURGERY     partial knee replacement right   MELANOMA EXCISION WITH SENTINEL LYMPH NODE BIOPSY Right 09/15/2017   Procedure: BIOPSY RIGHT SHOULDER MOLE AND WIDE LOCAL EXCISION OF RIGHT ARM MELANOMA, ADVANCEMENT FLAP CLOSURE, SENTINEL LYMPH NODE BIOPSY  RIGHT ARM MELANOMA;  Surgeon: Lockie Rima, MD;  Location: [facility];  Service: General;  Laterality:  Right;   REDUCTION MAMMAPLASTY Bilateral    REPAIR QUADRICEPS / HAMSTRING MUSCLE     REPLACEMENT TOTAL KNEE Left 09/20/2018   Dr[facility] at [facility]   TONSILLECTOMY     tubes in ears  10/12     Family History  Problem Relation Age of Onset   Diabetes Mother    Heart disease Mother    Hypertension Mother    Heart attack Mother 66   Other Father        MVA   Asthma Sister    Diabetes Sister    Cancer Maternal Grandmother        Uterine   Breast cancer Maternal Grandmother    Heart attack Paternal  Grandmother    Asthma Son    Colon cancer Neg Hx    Colon polyps Neg Hx    Esophageal cancer Neg Hx    Stomach cancer Neg Hx    Rectal cancer Neg Hx     Social History   Tobacco Use   Smoking status: Never   Smokeless tobacco: Never  Vaping Use   Vaping status: Never Used  Substance Use Topics   Alcohol use: Yes    Alcohol/week: 3.0 - 4.0 standard drinks of alcohol    Types: 3 - 4 Glasses of wine per week   Drug use: No    Review of Systems:   Review of Systems  Constitutional:  Negative for chills, fever, malaise/fatigue and weight loss.  HENT:  Negative for hearing loss, sinus pain and sore throat.   Respiratory:  Negative for cough and hemoptysis.   Cardiovascular:  Negative for chest pain, palpitations, leg swelling and PND.  Gastrointestinal:  Negative for abdominal pain, constipation, diarrhea, heartburn, nausea and vomiting.  Genitourinary:  Negative for dysuria, frequency and urgency.  Musculoskeletal:  Negative for back pain, myalgias and neck pain.  Skin:  Negative for itching and rash.  Neurological:  Negative for dizziness, tingling, seizures and headaches.  Endo/Heme/Allergies:  Negative for polydipsia.  Psychiatric/Behavioral:  Negative for depression. The patient is not nervous/anxious.       Objective:   BP 136/88 (BP Location: Left Arm, Patient Position: Sitting, Cuff Size: Normal)   Pulse 65   Temp 97.7 F (36.5 C) (Temporal)   Ht 5' 0.25"  (1.53 m)   Wt 140 lb 4 oz (63.6 kg)   SpO2 98%   BMI 27.16 kg/m  Body mass index is 27.16 kg/m.   General Appearance:    Alert, cooperative, no distress, appears stated age  Head:    Normocephalic, without obvious abnormality, atraumatic  Eyes:    PERRL, conjunctiva/corneas clear, EOM's intact, fundi    benign, both eyes  Ears:    Normal TM's and external ear canals, both ears  Nose:   Nares normal, septum midline, mucosa normal, no drainage    or sinus tenderness  Throat:   Lips, mucosa, and tongue normal; teeth and gums normal  Neck:   Supple, symmetrical, trachea midline, no adenopathy;    thyroid :  no enlargement/tenderness/nodules; no carotid   bruit or JVD  Back:     Symmetric, no curvature, ROM normal, no CVA tenderness  Lungs:     Clear to auscultation bilaterally, respirations unlabored  Chest Wall:    No tenderness or deformity   Heart:    Regular rate and rhythm, S1 and S2 normal, no murmur, rub or gallop  Breast Exam:    Deferred  Abdomen:     Soft, non-tender, bowel sounds active all four quadrants,    no masses, no organomegaly  Genitalia:    Deferred  Extremities:   Extremities normal, atraumatic, no cyanosis or edema  Pulses:   2+ and symmetric all extremities  Skin:   Skin color, texture, turgor normal, no rashes or lesions  Lymph nodes:   Cervical, supraclavicular, and axillary nodes normal  Neurologic:   CNII-XII intact, normal strength, sensation and reflexes    throughout    Assessment/Plan:   Routine physical examination Today patient counseled on age appropriate routine health concerns for screening and prevention, each reviewed and up to date or declined. Immunizations reviewed and up to date or declined. Labs ordered and reviewed. Risk factors for depression  reviewed and negative. Hearing function and visual acuity are intact. ADLs screened and addressed as needed. Functional ability and level of safety reviewed and appropriate. Education, counseling and  referrals performed based on assessed risks today. Patient provided with a copy of personalized plan for preventive services.  Primary hypertension Normotensive Continue Amlodipine  5 mg once daily, 12.5 mg metoprolol  xl daily and Olmesartan  20 mg Follow up in 3 month(s), sooner if concerns  Insulin  resistance Update Hemoglobin A1c and provide recommendations   Overweight Continue efforts at healthy lifestyle  Chronic systolic congestive heart failure, NYHA class 2 (HCC) Well controlled per patient Most recent cardiology note reviewed Volume status seems well controlled Continue management per cardiology  Chronic nonintractable headache, unspecified headache type Overall stable with topamax  25 mg twice daily   Lymphocytic colitis Most recent gastroenterology note reviewed Continue management per gastroenterology   Mild intermittent asthma, unspecified whether complicated Well controlled with as needed inhalers Denies any current concerns  Generalized anxiety disorder Well controlled Continue Lexapro  20 mg daily  Hormone replacement therapy (HRT) Reviewed Management per gynecology   Hyperlipidemia Update lipid panel Statin managed by cardiology  Vitamin D  deficiency Update vitamin D  and provide recommendations   Gastroesophageal reflux disease, unspecified whether esophagitis present Uncontrolled Stop Pepcid   Start protonix  40 mg daily  Follow up in 3 months to review chronic medical issues   I, Timoteo Force, acting as a Neurosurgeon for [employer], [place]., have documented all relevant documentation on the behalf of Alexander Iba, [place], as directed by  Alexander Iba, PA while in the presence of Alexander Iba, [place].  I, Darl Edu, have reviewed all documentation for this visit. The documentation on 05/03/23 for the exam, diagnosis, procedures, and orders are all accurate and complete.  Alexander Iba, PA-C [facility]

## 2023-05-03 NOTE — Telephone Encounter (Signed)
 CRITICAL VALUE STICKER  CRITICAL VALUE: Vit D >120  RECEIVER (on-site recipient of call): Jerilyn Monte, LPN  DATE & TIME NOTIFIED: 05/03/2023 at 3:00 PM  MESSENGER (representative from lab): [facility]  MD NOTIFIED: Alexander Iba, [place]  TIME OF NOTIFICATION: 3:01 PM  RESPONSE:

## 2023-05-03 NOTE — Telephone Encounter (Signed)
 See result note.

## 2023-05-03 NOTE — Patient Instructions (Addendum)
 It was great to see you!  Stop Pepcid  and start Pantoprazole   Please go to the lab for blood work.   Our office will call you with your results unless you have chosen to receive results via MyChart.  If your blood work is normal we will follow-up each year for physicals and as scheduled for chronic medical problems.  If anything is abnormal we will treat accordingly and get you in for a follow-up.  Take care,  Layci Stenglein

## 2023-05-05 ENCOUNTER — Other Ambulatory Visit: Payer: Self-pay | Admitting: *Deleted

## 2023-05-05 ENCOUNTER — Encounter: Payer: Self-pay | Admitting: *Deleted

## 2023-05-05 DIAGNOSIS — R7989 Other specified abnormal findings of blood chemistry: Secondary | ICD-10-CM

## 2023-05-05 NOTE — Telephone Encounter (Signed)
 Noted.

## 2023-05-24 DIAGNOSIS — M9901 Segmental and somatic dysfunction of cervical region: Secondary | ICD-10-CM | POA: Diagnosis not present

## 2023-06-04 ENCOUNTER — Ambulatory Visit: Admitting: Physician Assistant

## 2023-06-04 ENCOUNTER — Ambulatory Visit (INDEPENDENT_AMBULATORY_CARE_PROVIDER_SITE_OTHER)

## 2023-06-04 ENCOUNTER — Encounter: Payer: Self-pay | Admitting: Physician Assistant

## 2023-06-04 VITALS — BP 110/80 | HR 71 | Temp 98.2°F | Ht 60.25 in | Wt 139.0 lb

## 2023-06-04 DIAGNOSIS — S92902A Unspecified fracture of left foot, initial encounter for closed fracture: Secondary | ICD-10-CM | POA: Diagnosis not present

## 2023-06-04 DIAGNOSIS — M79672 Pain in left foot: Secondary | ICD-10-CM

## 2023-06-04 DIAGNOSIS — M2012 Hallux valgus (acquired), left foot: Secondary | ICD-10-CM | POA: Diagnosis not present

## 2023-06-04 DIAGNOSIS — M19072 Primary osteoarthritis, left ankle and foot: Secondary | ICD-10-CM | POA: Diagnosis not present

## 2023-06-04 NOTE — Patient Instructions (Signed)
 It was great to see you!  Continue efforts at supportive foot wear  We will get you to a specialist based on results  I will be in touch with xray results  Take care,  Eriq Hufford PA-C

## 2023-06-04 NOTE — Progress Notes (Signed)
 Gail Bailey is a 63 y.o. female here for a new problem.  History of Present Illness:   Chief Complaint  Patient presents with   Foot Pain    Knot on top of left foot; been x91mo; do not recall injury; was walking so much early May and its been flaring up much more since then; constant aching pain; taking tylenol     Knot on foot: Pt complains of a knot on top her left foot; onset was about 2 months ago.  Her pain radiates downward towards her arch.  Described her pain as "walking on nails".  She also endorses occasional ankle pain and swelling.  She has tried managing her pain with Tylenol  and using Salonpas pain patches.  She is uncertain of exact injury, but notes increased walking in early May.  She recently was walking 15-18K steps a day for a full week while at a work conference.  While at the conference she was initially wearing Clarks, but had to change to tennis shoes due to the severe pain.  She notes she has not worn tennis shoes in her 35+ years of going to conferences.  No known injury.  Her house shoes have good support.  Does not tend to wear orthotics or insoles.   Past Medical History:  Diagnosis Date   Allergy    Anxiety    Asthma    CAD (coronary artery disease)    Cardiomyopathy (HCC)    a. Echo 8/16:  EF 40-45%, apical and ant-septal HK, Gr 2 DD   Diverticula, colon    Family history of adverse reaction to anesthesia    sisiter had PONV   GAD (generalized anxiety disorder)    Headache    rare migraines   History of cardiovascular stress test    Myoview  8/16:  EF 48%, anterior and apical defect c/w breast atten, no ischemia; Low Risk // Nuclear stress test 9/18: Low risk stress nuclear study with fixed septal defect consistent with LBBB vs prior infarct; no ischemia; EF 53 with septal akinesis.   Hyperlipidemia    Hypertension    Irregular periods/menstrual cycles    LBBB (left bundle branch block)    Melanoma (HCC)    right arm   Seasonal allergies     TIA (transient ischemic attack)    Torn meniscus    Vitamin B12 deficiency    Vitamin D  deficiency      Social History   Tobacco Use   Smoking status: Never   Smokeless tobacco: Never  Vaping Use   Vaping status: Never Used  Substance Use Topics   Alcohol use: Yes    Alcohol/week: 3.0 - 4.0 standard drinks of alcohol    Types: 3 - 4 Glasses of wine per week   Drug use: No    Past Surgical History:  Procedure Laterality Date   ABLATION     BREAST REDUCTION SURGERY     CARDIAC CATHETERIZATION     CESAREAN SECTION     x2   KNEE SURGERY     partial knee replacement right   MELANOMA EXCISION WITH SENTINEL LYMPH NODE BIOPSY Right 09/15/2017   Procedure: BIOPSY RIGHT SHOULDER MOLE AND WIDE LOCAL EXCISION OF RIGHT ARM MELANOMA, ADVANCEMENT FLAP CLOSURE, SENTINEL LYMPH NODE BIOPSY  RIGHT ARM MELANOMA;  Surgeon: Lockie Rima, MD;  Location: [facility];  Service: General;  Laterality: Right;   REDUCTION MAMMAPLASTY Bilateral    REPAIR QUADRICEPS / HAMSTRING MUSCLE     REPLACEMENT TOTAL  KNEE Left 09/20/2018   Dr. Hillary [facility]ing at [facility]   TONSILLECTOMY     tubes in ears  10/12    Family History  Problem Relation Age of Onset   Diabetes Mother    Heart disease Mother    Hypertension Mother    Heart attack Mother 17   Other Father        MVA   Asthma Sister    Diabetes Sister    Cancer Maternal Grandmother        Uterine   Breast cancer Maternal Grandmother    Heart attack Paternal Grandmother    Asthma Son    Colon cancer Neg Hx    Colon polyps Neg Hx    Esophageal cancer Neg Hx    Stomach cancer Neg Hx    Rectal cancer Neg Hx     Allergies  Allergen Reactions   Metoprolol      Not allergy, but intolerance - had bradycardia with HR in the 40s even with low dose   Oxycodone  Itching    Itching: Trunk, palms of hands, legs, back      Current Medications:   Current Outpatient Medications:    acyclovir  ointment (ZOVIRAX ) 5 %, Apply 1 application topically  every 3 (three) hours., Disp: 16 g, Rfl: 1   Adapalene -Benzoyl Peroxide  (EPIDUO ) 0.1-2.5 % gel, Apply 1 application topically as needed (acne)., Disp: 45 g, Rfl: 0   albuterol  (VENTOLIN  HFA) 108 (90 Base) MCG/ACT inhaler, INHALE 2 PUFFS INTO LUNGS EVERY 4 HOURS AS NEEDED FOR WHEEZING OR SHORTNESS OF BREATH, Disp: , Rfl:    amLODipine  (NORVASC ) 5 MG tablet, TAKE 1 TABLET BY MOUTH EVERY DAY FOR BLOOD PRESSURE, Disp: 90 tablet, Rfl: 3   aspirin  EC 81 MG tablet, Take 1 tablet (81 mg total) by mouth daily., Disp: , Rfl:    Cholecalciferol (VITAMIN D  PO), Take 5,000 Units by mouth daily. , Disp: , Rfl:    escitalopram  (LEXAPRO ) 20 MG tablet, Take 1 tablet Daily for Mood & Anxiety, Disp: 90 tablet, Rfl: 0   estradiol  (ESTRACE ) 0.5 MG tablet, Take 0.5 mg by mouth daily., Disp: , Rfl:    fluticasone  (FLONASE ) 50 MCG/ACT nasal spray, Place 2 sprays into both nostrils as needed for allergies or rhinitis., Disp: , Rfl:    Fluticasone -Umeclidin-Vilant (TRELEGY ELLIPTA ) 200-62.5-25 MCG/ACT AEPB, Inhale 1 Inhalation into the lungs as needed (asthma flareup)., Disp: , Rfl:    furosemide  (LASIX ) 40 MG tablet, TAKE 1 TABLET EVERY DAY FOR BLOOD PRESSURE AND FLUID, Disp: 90 tablet, Rfl: 3   hydrocortisone 2.5 % cream, APPLY DAILY TO SKIN TO AFFECTED AREA TWICE A DAY FOR 2 WEEKS, Disp: , Rfl:    ipratropium-albuterol  (DUONEB) 0.5-2.5 (3) MG/3ML SOLN, Take 3 mLs by nebulization every 4 (four) hours as needed., Disp: 90 mL, Rfl: 2   loperamide  (IMODIUM  A-D) 2 MG tablet, Take 1 tablet (2 mg total) by mouth daily. Titrate as needed, Disp: 30 tablet, Rfl: 0   medroxyPROGESTERone  (PROVERA ) 2.5 MG tablet, Take 2.5 mg by mouth daily., Disp: , Rfl:    metoprolol  succinate (TOPROL -XL) 25 MG 24 hr tablet, Take 0.5 tablets (12.5 mg total) by mouth daily., Disp: 8 tablet, Rfl: 0   montelukast  (SINGULAIR ) 10 MG tablet, Take 1 tablet daily for Allergies & Asthma, Disp: 90 tablet, Rfl: 3   olmesartan  (BENICAR ) 20 MG tablet, Take 1  tablet (20 mg total) by mouth daily., Disp: 90 tablet, Rfl: 1   Omega-3 Fatty Acids (FISH OIL PO), Take 1  capsule by mouth daily., Disp: , Rfl:    pantoprazole  (PROTONIX ) 40 MG tablet, Take 1 tablet (40 mg total) by mouth daily., Disp: 90 tablet, Rfl: 0   potassium chloride  (KLOR-CON ) 10 MEQ tablet, TAKE 1 TABLET BY MOUTH 2 TIMES DAILY., Disp: 60 tablet, Rfl: 11   rosuvastatin  (CRESTOR ) 10 MG tablet, TAKE 1 TABLET BY MOUTH EVERY DAY, Disp: 30 tablet, Rfl: 11   topiramate  (TOPAMAX ) 50 MG tablet, TAKE 1 TABLET 2 TIMES DAILY AT SUPPERTIME & BEDTIME FOR DIETING & WEIGHT LOSS, Disp: 180 tablet, Rfl: 3   triamcinolone  (KENALOG ) 0.025 % ointment, Apply 1 Application topically 2 (two) times daily., Disp: 30 g, Rfl: 0   valACYclovir  (VALTREX ) 500 MG tablet, Take  1 tablet  2 x /day as needed for Viral Suppression, Disp: 180 tablet, Rfl: 2   Review of Systems:   Negative unless otherwise specified per HPI.  Vitals:   Vitals:   06/04/23 1105  BP: 110/80  Pulse: 71  Temp: 98.2 F (36.8 C)  TempSrc: Temporal  SpO2: 98%  Weight: 139 lb (63 kg)  Height: 5' 0.25" (1.53 m)     Body mass index is 26.92 kg/m.  Physical Exam:   Physical Exam Constitutional:      Appearance: Normal appearance. She is well-developed.  HENT:     Head: Normocephalic and atraumatic.  Eyes:     General: Lids are normal.     Extraocular Movements: Extraocular movements intact.     Conjunctiva/sclera: Conjunctivae normal.  Pulmonary:     Effort: Pulmonary effort is normal.  Musculoskeletal:        General: Normal range of motion.     Cervical back: Normal range of motion and neck supple.     Comments: Foot & Ankle: Overall foot and ankle are well aligned, no significant deformity No significant TTP over the base of the 5th metatarsal, or posterior aspect of medial or lateral malleolus Tenderness to palpation to navicular bone with mild swelling No significant pain or discomfort with isolated passive forefoot  abduction. Inversion, eversion, dorsiflexion and plantar flexion strength 5+/5.   Skin:    General: Skin is warm and dry.  Neurological:     Mental Status: She is alert and oriented to person, place, and time.  Psychiatric:        Attention and Perception: Attention and perception normal.        Mood and Affect: Mood normal.        Behavior: Behavior normal.        Thought Content: Thought content normal.        Judgment: Judgment normal.     Assessment and Plan:   1. Left foot pain (Primary) - DG Foot Complete Left; Future  No red flags or indication for immobilization at this time Consider RICE therapy  Unclear etiology Due to chronicity of symptom(s) and no MOI will order imaging Based on results, will likely send to Sports Medicine at [facility] for consideration of orthotics or other intervention  I, Bernita Bristle, acting as a Neurosurgeon for Alexander Iba, [place]., have documented all relevant documentation on the behalf of Alexander Iba, [place], as directed by   while in the presence of Alexander Iba, [place].  I, Alexander Iba, [place], have reviewed all documentation for this visit. The documentation on 06/04/23 for the exam, diagnosis, procedures, and orders are all accurate and complete.  Alexander Iba, PA-C

## 2023-06-07 ENCOUNTER — Ambulatory Visit: Payer: Self-pay | Admitting: Physician Assistant

## 2023-06-07 DIAGNOSIS — M79672 Pain in left foot: Secondary | ICD-10-CM

## 2023-06-08 ENCOUNTER — Other Ambulatory Visit: Payer: Self-pay | Admitting: Physician Assistant

## 2023-06-08 DIAGNOSIS — M79672 Pain in left foot: Secondary | ICD-10-CM

## 2023-06-09 ENCOUNTER — Ambulatory Visit: Admitting: Physician Assistant

## 2023-06-14 ENCOUNTER — Other Ambulatory Visit: Payer: Self-pay

## 2023-06-14 ENCOUNTER — Ambulatory Visit: Admitting: Family Medicine

## 2023-06-14 VITALS — BP 108/70 | HR 73 | Ht 60.25 in | Wt 138.0 lb

## 2023-06-14 DIAGNOSIS — M79672 Pain in left foot: Secondary | ICD-10-CM

## 2023-06-14 NOTE — Patient Instructions (Addendum)
 Thank you for coming in today.   Wear a CAM Conservation officer, nature  Use a knee scooter if needed  Arch Strap  Please use Voltaren gel (Generic Diclofenac Gel) up to 4x daily for pain as needed.  This is available over-the-counter as both the name brand Voltaren gel and the generic diclofenac gel.   You received an injection today. Seek immediate medical attention if the joint becomes red, extremely painful, or is oozing fluid.

## 2023-06-14 NOTE — Progress Notes (Signed)
 Joanna Muck, PhD, LAT, ATC acting as a scribe for Garlan Juniper, MD.  Gail Bailey is a 63 y.o. female who presents to [facility] at [facility] today for L foot pain. Pt was previously seen by Dr. Felipe Horton on 08/04/22 for back pain.  Today, pt c/o L foot pain ongoing for about 2.5 months. No injury. She works as a Theatre stage manager and was at [employer] in [place] and was doing a lot of walking. She notes a "knot" on the dorsum of her L foot. Pt locates pain to mid- arch of her L foot. W/ increased walking pain will go into L ankle.   Aggravates: walking Treatments tried: Tylenol , pain patches, only wearing sneakers  Pertinent review of systems: No fevers or chills  Relevant historical information: Hypertension.   Exam:  BP 108/70   Pulse 73   Ht 5' 0.25" (1.53 m)   Wt 138 lb (62.6 kg)   SpO2 98%   BMI 26.73 kg/m  General: Well Developed, well nourished, and in no acute distress.   MSK: Left foot normal appearing Tender palpation medial dorsal midfoot at tarsometatarsal joint. Normal foot and ankle motion.  Intact strength.    Lab and Radiology Results  Procedure: Real-time Ultrasound Guided Injection of left midfoot medial tarsometatarsal joint Device: Philips Affiniti 50G/GE Logiq Images permanently stored and available for review in PACS Verbal informed consent obtained.  Discussed risks and benefits of procedure. Warned about infection, bleeding, hyperglycemia damage to structures among others. Patient expresses understanding and agreement Time-out conducted.   Noted no overlying erythema, induration, or other signs of local infection.   Skin prepped in a sterile fashion.   Local anesthesia: Topical Ethyl chloride.   With sterile technique and under real time ultrasound guidance: 40 mg of Kenalog  and 1 mL of Marcaine  injected into left midfoot. Fluid seen entering the left midfoot.   Completed without difficulty   Pain immediately resolved  suggesting accurate placement of the medication.   Advised to call if fevers/chills, erythema, induration, drainage, or persistent bleeding.   Images permanently stored and available for review in the ultrasound unit.  Impression: Technically successful ultrasound guided injection.      EXAM: LEFT FOOT - COMPLETE 3+ VIEW   COMPARISON:  Left foot radiographs 02/13/2015   FINDINGS: Borderline mild hallux valgus with great toe metatarsophalangeal angle measuring 15 degrees. This is unchanged from prior. Worsened now moderate lateral great toe metatarsophalangeal joint space narrowing and peripheral spurring.   There is new irregularity of the mid to distal great toe metatarsal shaft which may be secondary to interval postsurgical change, especially given numerous punctate densities that may represent postsurgical change in the surrounding soft tissues. This also could represent interval but chronic healed fracture.   Worsened moderate dorsal likely second tarsometatarsal degenerative osteophytosis. Moderate to severe second tarsometatarsal joint space narrowing, subchondral sclerosis and subchondral cystic change on frontal view, worsened from prior.   Small plantar calcaneal heel spur.   IMPRESSION: 1. Borderline mild hallux valgus with moderate great toe metatarsophalangeal joint osteoarthritis, worsened from prior. 2. New irregularity of the mid to distal great toe metatarsal shaft cortex, possible interval postsurgical change versus interval healed fracture. 3. 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     Electronically Signed   By: Bertina Broccoli M.D.   On: 06/04/2023 17:13   I, Garlan Juniper, personally (independently) visualized and performed the interpretation of the images attached in this note.  Assessment and Plan: 63 y.o. female with left midfoot pain.  Patient rapidly increased her activity level and she does have  arthritic changes seen on the foot x-ray at the midfoot.  Plan for injection into the swollen painful joint.  Plan also for arch strap and arch support.  Consider CAM Walker boot or knee scooter if needed for the upcoming conference that she is attending and running.   PDMP not reviewed this encounter. Orders Placed This Encounter  Procedures   US  LIMITED JOINT SPACE STRUCTURES LOW LEFT(NO LINKED CHARGES)    Reason for Exam (SYMPTOM  OR DIAGNOSIS REQUIRED):   left foot pain    Preferred imaging location?:   [facility]   No orders of the defined types were placed in this encounter.    Discussed warning signs or symptoms. Please see discharge instructions. Patient expresses understanding.   The above documentation has been reviewed and is accurate and complete Garlan Juniper, M.D.

## 2023-06-28 ENCOUNTER — Other Ambulatory Visit: Payer: Self-pay | Admitting: Physician Assistant

## 2023-06-28 ENCOUNTER — Other Ambulatory Visit: Payer: Self-pay | Admitting: Gastroenterology

## 2023-07-01 ENCOUNTER — Other Ambulatory Visit: Payer: Self-pay | Admitting: *Deleted

## 2023-07-01 ENCOUNTER — Encounter: Payer: Self-pay | Admitting: Physician Assistant

## 2023-07-01 DIAGNOSIS — F411 Generalized anxiety disorder: Secondary | ICD-10-CM

## 2023-07-01 DIAGNOSIS — J45909 Unspecified asthma, uncomplicated: Secondary | ICD-10-CM

## 2023-07-01 DIAGNOSIS — I10 Essential (primary) hypertension: Secondary | ICD-10-CM

## 2023-07-01 MED ORDER — ESCITALOPRAM OXALATE 20 MG PO TABS: ORAL_TABLET | ORAL | 1 refills | Status: DC

## 2023-07-13 DIAGNOSIS — M9901 Segmental and somatic dysfunction of cervical region: Secondary | ICD-10-CM | POA: Diagnosis not present

## 2023-07-14 DIAGNOSIS — M9901 Segmental and somatic dysfunction of cervical region: Secondary | ICD-10-CM | POA: Diagnosis not present

## 2023-07-15 ENCOUNTER — Ambulatory Visit: Admitting: Family Medicine

## 2023-07-15 ENCOUNTER — Ambulatory Visit: Payer: Self-pay

## 2023-07-15 ENCOUNTER — Encounter: Payer: Self-pay | Admitting: Family Medicine

## 2023-07-15 VITALS — BP 160/110 | HR 82 | Temp 98.0°F | Ht 60.0 in | Wt 139.4 lb

## 2023-07-15 DIAGNOSIS — M542 Cervicalgia: Secondary | ICD-10-CM | POA: Diagnosis not present

## 2023-07-15 DIAGNOSIS — R0789 Other chest pain: Secondary | ICD-10-CM | POA: Diagnosis not present

## 2023-07-15 MED ORDER — PREDNISONE 50 MG PO TABS
ORAL_TABLET | ORAL | 0 refills | Status: DC
Start: 2023-07-15 — End: 2023-08-12

## 2023-07-15 MED ORDER — CYCLOBENZAPRINE HCL 10 MG PO TABS: 10.0000 mg | ORAL_TABLET | Freq: Three times a day (TID) | ORAL | 0 refills | Status: DC | PRN

## 2023-07-15 MED ORDER — METHYLPREDNISOLONE ACETATE 40 MG/ML IJ SUSP
40.0000 mg | Freq: Once | INTRAMUSCULAR | Status: AC
  Administered 2023-07-15: 40 mg via INTRAMUSCULAR

## 2023-07-15 NOTE — Progress Notes (Signed)
 Gail Bailey is a 63 y.o. female who presents today for an office visit.  Assessment/Plan:  New/Acute Problems: Neck Pain  Exam today consistent with cervical strain.  She does have known history of degenerative disc disease as well which is likely contributing.  She is having some paresthesias on the left likely due to mild cervical radiculopathy however her exam is otherwise reassuring without any significant neurologic findings.  She cannot take NSAIDs due to previous gastric history.  Will start prednisone  and give 40 mg Depo-Medrol  today.  Also start Flexeril .  She will let us  know if not improving in the next few days and would consider imaging versus having her follow-up with sports medicine at that time.  Left Arm paresthesias Likely secondary to cervical radiculopathy as above.  Overall reassuring exam today.  EKG was performed which is similar in appearance to her previous EKGs.  Should improve with above treatment for her neck pain though she will let us  know if she has any worsening symptoms.  Headache No red flags.  Reassuring neurologic exam today.  Likely cervicogenic related to above.  Given her reassuring exam and likely explanation for headache do not think we need to obtain any imaging or further workup at this time.  She will let us  know if not improving.  We discussed reasons to return to care   Overall reassuring exam without any significant neurologic findings.  Symptoms likely seem to be more muscular in etiology.  She is having slight paresthesias on the left  Chronic Problems Addressed Today: No problem-specific Assessment & Plan notes found for this encounter.     Subjective:   HPI:  See Assessment / plan for status of chronic conditions.  Patient was initially scheduled for neck pain and back pain however on the way to the office this morning started experiencing dizziness.  Her back pain and neck pain started a few days ago while moving [place]. She saw the chiropractor after this and they worked on her neck and back with significant improvement. She is still having some posterior head pain. She is getting some tingling in her left arm as well. She has tried tylenol  which has not helped. She has to avoid NSAIDs due due to past GI issues.        Objective:  Physical Exam: BP (!) 160/110   Pulse 82   Temp 98 F (36.7 C) (Temporal)   Ht 5' (1.524 m)   Wt 139 lb 6.4 oz (63.2 kg)   SpO2 98%   BMI 27.22 kg/m   Gen: No acute distress, resting comfortably CV: Regular rate and rhythm with no murmurs appreciated Pulm: Normal work of breathing, clear to auscultation bilaterally with no crackles, wheezes, or rhonchi MUSCULOSKELETAL: - Back: No deformities.  Nontender to palpation - Neck: No deformities.  Tenderness palpation along bilateral cervical paraspinal muscle groups.  Pain with rotation left greater than right over - Arms: No deformities.  Strength 5 out of 5 throughout.  Sensation light touch intact throughout.  Neurovascularly intact distally.  Spurling deferred. - Legs: No deformities.  Strength 5 out of 5 throughout.  Sensation light touch intact throughout. Neuro:  cranial nerves II through XII intact.  Finger-nose-finger testing intact bilaterally. Psych: Normal affect and thought content  EKG: Normal sinus rhythm.  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  No signs of acute ischemia.  Time Spent: 30 minutes of total time was spent on the date of the  encounter performing the following actions: chart review prior to seeing the patient, obtaining history, performing a medically necessary exam, counseling on the treatment plan, placing orders, and documenting in our EHR.        Worth HERO. Kennyth, MD 07/15/2023 10:42 AM

## 2023-07-15 NOTE — Telephone Encounter (Signed)
 FYI Only or Action Required?: FYI only for provider.  Patient was last seen in primary care on 06/04/2023 by Job Lukes, PA. Called Nurse Triage reporting Back Pain. Symptoms began several days ago. Interventions attempted: OTC medications: tylenol . Symptoms are: unchanged.  Triage Disposition: See Physician Within 24 Hours  Patient/caregiver understands and will follow disposition?: Yes, will follow disposition  Copied from CRM [id number]. Topic: Clinical - Red Word Triage >> Jul 15, 2023  8:35 AM Gail Bailey wrote: Red Word that prompted transfer to Nurse Triage: Pt states she is on blood pressure medication and bp is typically very controlled however, it has been high since she injured her back picking up something heavy last weekend at the beach.  Blood pressure reading have been: 155/? (This morning) 168/90 (yesterday)  States last night her left arm was numb and is still tingling today and she has a severe headache as well. Reason for Disposition  [1] Numbness in an arm or hand (i.e., loss of sensation) AND [2] upper back pain  Answer Assessment - Initial Assessment Questions 1. ONSET: When did the pain begin?      About a week ago 2. LOCATION: Where does it hurt? (upper, mid or lower back)     Lower, and neck 3. SEVERITY: How bad is the pain?  (e.g., Scale 1-10; mild, moderate, or severe)   - MILD (1-3): Doesn't interfere with normal activities.    - MODERATE (4-7): Interferes with normal activities or awakens from sleep.    - SEVERE (8-10): Excruciating pain, unable to do any normal activities.      7 4. PATTERN: Is the pain constant? (e.g., yes, no; constant, intermittent)      constant 5. RADIATION: Does the pain shoot into your legs or somewhere else?     Radiates to head and neck 6. CAUSE:  What do you think is causing the back pain?      Lifting heavy object last week prior to this starting 7. BACK OVERUSE:  Any recent lifting of heavy objects, strenuous  work or exercise?     Moving furniture 8. MEDICINES: What have you taken so far for the pain? (e.g., nothing, acetaminophen , NSAIDS)     Tylenol , does not help 9. NEUROLOGIC SYMPTOMS: Do you have any weakness, numbness, or problems with bowel/bladder control?     States her hand is tingling today, states that her arm was tingling yesterday, pt states she has a hx of tingling of her extremities-this feels similarly 10. OTHER SYMPTOMS: Do you have any other symptoms? (e.g., fever, abdomen pain, burning with urination, blood in urine)       High blood pressure 145+/88+, HA  Denies CP, SOB.  Protocols used: Back Pain-A-AH

## 2023-07-15 NOTE — Patient Instructions (Addendum)
 It was very nice to see you today!  I think you headache and high blood pressure are coming from the pain in your neck.  Please start the prednisone  and Flexeril .  Will give you a cortisone shot today as well.  Let us  know if you have any worsening symptoms or symptoms do not improve in the next several days  Return if symptoms worsen or fail to improve.   Take care, Dr Kennyth  PLEASE NOTE:  If you had any lab tests, please let us  know if you have not heard back within a few days. You may see your results on mychart before we have a chance to review them but we will give you a call once they are reviewed by us .   If we ordered any referrals today, please let us  know if you have not heard from their office within the next week.   If you had any urgent prescriptions sent in today, please check with the pharmacy within an hour of our visit to make sure the prescription was transmitted appropriately.   Please try these tips to maintain a healthy lifestyle:  Eat at least 3 REAL meals and 1-2 snacks per day.  Aim for no more than 5 hours between eating.  If you eat breakfast, please do so within one hour of getting up.   Each meal should contain half fruits/vegetables, one quarter protein, and one quarter carbs (no bigger than a computer mouse)  Cut down on sweet beverages. This includes juice, soda, and sweet tea.   Drink at least 1 glass of water with each meal and aim for at least 8 glasses per day  Exercise at least 150 minutes every week.

## 2023-07-19 MED ORDER — MONTELUKAST SODIUM 10 MG PO TABS: ORAL_TABLET | ORAL | 3 refills | Status: AC

## 2023-07-19 MED ORDER — AMLODIPINE BESYLATE 5 MG PO TABS: ORAL_TABLET | ORAL | 3 refills | Status: AC

## 2023-07-28 DIAGNOSIS — M9901 Segmental and somatic dysfunction of cervical region: Secondary | ICD-10-CM | POA: Diagnosis not present

## 2023-08-11 DIAGNOSIS — L821 Other seborrheic keratosis: Secondary | ICD-10-CM | POA: Insufficient documentation

## 2023-08-12 ENCOUNTER — Ambulatory Visit: Admitting: Physician Assistant

## 2023-08-12 ENCOUNTER — Telehealth: Payer: Self-pay | Admitting: Physician Assistant

## 2023-08-12 ENCOUNTER — Encounter: Payer: Self-pay | Admitting: Physician Assistant

## 2023-08-12 ENCOUNTER — Ambulatory Visit

## 2023-08-12 VITALS — BP 126/80 | HR 70 | Temp 97.9°F | Ht 60.0 in | Wt 137.2 lb

## 2023-08-12 DIAGNOSIS — M79672 Pain in left foot: Secondary | ICD-10-CM

## 2023-08-12 DIAGNOSIS — I10 Essential (primary) hypertension: Secondary | ICD-10-CM | POA: Diagnosis not present

## 2023-08-12 DIAGNOSIS — Z23 Encounter for immunization: Secondary | ICD-10-CM

## 2023-08-12 DIAGNOSIS — M19072 Primary osteoarthritis, left ankle and foot: Secondary | ICD-10-CM | POA: Diagnosis not present

## 2023-08-12 DIAGNOSIS — F411 Generalized anxiety disorder: Secondary | ICD-10-CM

## 2023-08-12 DIAGNOSIS — R7989 Other specified abnormal findings of blood chemistry: Secondary | ICD-10-CM | POA: Diagnosis not present

## 2023-08-12 LAB — VITAMIN D 25 HYDROXY (VIT D DEFICIENCY, FRACTURES): VITD: >120 ng/mL

## 2023-08-12 NOTE — Patient Instructions (Addendum)
 It was great to see you!  We will get updated vitamin D  and left foot xray today  Follow-up after May 03, 2024 for your annual Comprehensive Physical Exam (CPE) preventive care annual visit -- sooner if concerns!

## 2023-08-12 NOTE — Progress Notes (Signed)
 Gail Bailey is a 63 y.o. female here for a follow up of a pre-existing problem.  History of Present Illness:   Chief Complaint  Patient presents with   Medical Management of Chronic Issues    Here for 3 month f/u Hypertension, GERD,  Pt has been checking blood pressure at home, averaging 128/78, past few days has been 145/85.    HPI  Discussed the use of AI scribe software for clinical note transcription with the patient, who gave verbal consent to proceed.  History of Present Illness Gail Bailey is a 63 year old female with hypertension who presents for follow-up on her blood pressure management and foot pain.  Hypertension and antihypertensive therapy - Blood pressure improved since resuming amlodipine  after missing a refill - Recent blood pressure readings around 120 mmHg, but 140-150/80 mmHg over the past few days - No current side effects from amlodipine  after initial adjustment period - No leg swelling from amlodipine   Gastroesophageal reflux symptoms - Heartburn significantly improved  Anxiety  - Currently taking Lexapro  for mental health - Mood and mental health stable  Foot pain and injury - Foot pain evaluated by a physician; received injection with initial improvement - Re-injury occurred 4-5 weeks ago after dropping a heavy object on foot, resulting in bruising and pain, especially after prolonged walking - Uses Salonpas patches for pain relief - No use of immobilization such as wraps or bandages - Foot pain impacts footwear choices during work-related travel and conferences  Laboratory and imaging findings - Previous vitamin D  level elevated (>120), discontinued vitamin D  supplementation - A1c at 6 - Liver and kidney function tests within normal limits - Weight loss of approximately 1 pound since last visit  Breast and gynecologic health - Recent mammogram showed a spot, resolved on further examination - Up to date with Pap smear - Recent OB GYN visit  completed     Past Medical History:  Diagnosis Date   Allergy    Anxiety    Asthma    CAD (coronary artery disease)    Cardiomyopathy (HCC)    a. Echo 8/16:  EF 40-45%, apical and ant-septal HK, Gr 2 DD   Diverticula, colon    Family history of adverse reaction to anesthesia    sisiter had PONV   GAD (generalized anxiety disorder)    Headache    rare migraines   History of cardiovascular stress test    Myoview  8/16:  EF 48%, anterior and apical defect c/w breast atten, no ischemia; Low Risk // Nuclear stress test 9/18: Low risk stress nuclear study with fixed septal defect consistent with LBBB vs prior infarct; no ischemia; EF 53 with septal akinesis.   Hyperlipidemia    Hypertension    Irregular periods/menstrual cycles    LBBB (left bundle branch block)    Melanoma (HCC)    right arm   Seasonal allergies    TIA (transient ischemic attack)    Torn meniscus    Vitamin B12 deficiency    Vitamin D  deficiency      Social History   Tobacco Use   Smoking status: Never   Smokeless tobacco: Never  Vaping Use   Vaping status: Never Used  Substance Use Topics   Alcohol use: Yes    Alcohol/week: 3.0 - 4.0 standard drinks of alcohol    Types: 3 - 4 Glasses of wine per week   Drug use: No    Past Surgical History:  Procedure Laterality Date   ABLATION  BREAST REDUCTION SURGERY     CARDIAC CATHETERIZATION     CESAREAN SECTION     x2   KNEE SURGERY     partial knee replacement right   MELANOMA EXCISION WITH SENTINEL LYMPH NODE BIOPSY Right 09/15/2017   Procedure: BIOPSY RIGHT SHOULDER MOLE AND WIDE LOCAL EXCISION OF RIGHT ARM MELANOMA, ADVANCEMENT FLAP CLOSURE, SENTINEL LYMPH NODE BIOPSY  RIGHT ARM MELANOMA;  Surgeon: Aron Shoulders, MD;  Location:  SURGERY CENTER;  Service: General;  Laterality: Right;   REDUCTION MAMMAPLASTY Bilateral    REPAIR QUADRICEPS / HAMSTRING MUSCLE     REPLACEMENT TOTAL KNEE Left 09/20/2018   Dr. Viann at [facility]   TONSILLECTOMY      tubes in ears  10/12    Family History  Problem Relation Age of Onset   Diabetes Mother    Heart disease Mother    Hypertension Mother    Heart attack Mother 89   Other Father        MVA   Asthma Sister    Diabetes Sister    Cancer Maternal Grandmother        Uterine   Breast cancer Maternal Grandmother    Heart attack Paternal Grandmother    Asthma Son    Colon cancer Neg Hx    Colon polyps Neg Hx    Esophageal cancer Neg Hx    Stomach cancer Neg Hx    Rectal cancer Neg Hx     Allergies  Allergen Reactions   Metoprolol      Not allergy, but intolerance - had bradycardia with HR in the 40s even with low dose   Oxycodone  Itching    Itching: Trunk, palms of hands, legs, back      Current Medications:   Current Outpatient Medications:    acyclovir  ointment (ZOVIRAX ) 5 %, Apply 1 application topically every 3 (three) hours., Disp: 16 g, Rfl: 1   Adapalene -Benzoyl Peroxide  (EPIDUO ) 0.1-2.5 % gel, Apply 1 application topically as needed (acne)., Disp: 45 g, Rfl: 0   albuterol  (VENTOLIN  HFA) 108 (90 Base) MCG/ACT inhaler, INHALE 2 PUFFS INTO LUNGS EVERY 4 HOURS AS NEEDED FOR WHEEZING OR SHORTNESS OF BREATH, Disp: , Rfl:    amLODipine  (NORVASC ) 5 MG tablet, TAKE 1 TABLET BY MOUTH EVERY DAY FOR BLOOD PRESSURE, Disp: 90 tablet, Rfl: 3   aspirin  EC 81 MG tablet, Take 1 tablet (81 mg total) by mouth daily., Disp: , Rfl:    Cholecalciferol (VITAMIN D  PO), Take 5,000 Units by mouth daily. , Disp: , Rfl:    cyclobenzaprine  (FLEXERIL ) 10 MG tablet, Take 1 tablet (10 mg total) by mouth 3 (three) times daily as needed for muscle spasms., Disp: 30 tablet, Rfl: 0   escitalopram  (LEXAPRO ) 20 MG tablet, Take 1 tablet Daily for Mood & Anxiety, Disp: 90 tablet, Rfl: 1   estradiol  (ESTRACE ) 0.5 MG tablet, Take 0.5 mg by mouth daily., Disp: , Rfl:    fluticasone  (FLONASE ) 50 MCG/ACT nasal spray, Place 2 sprays into both nostrils as needed for allergies or rhinitis., Disp: , Rfl:     Fluticasone -Umeclidin-Vilant (TRELEGY ELLIPTA ) 200-62.5-25 MCG/ACT AEPB, Inhale 1 Inhalation into the lungs as needed (asthma flareup)., Disp: , Rfl:    furosemide  (LASIX ) 40 MG tablet, TAKE 1 TABLET EVERY DAY FOR BLOOD PRESSURE AND FLUID, Disp: 90 tablet, Rfl: 3   hydrocortisone 2.5 % cream, APPLY DAILY TO SKIN TO AFFECTED AREA TWICE A DAY FOR 2 WEEKS, Disp: , Rfl:    ipratropium-albuterol  (DUONEB) 0.5-2.5 (3) MG/3ML  SOLN, Take 3 mLs by nebulization every 4 (four) hours as needed., Disp: 90 mL, Rfl: 2   loperamide  (IMODIUM  A-D) 2 MG tablet, Take 1 tablet (2 mg total) by mouth daily. Titrate as needed, Disp: 30 tablet, Rfl: 0   medroxyPROGESTERone  (PROVERA ) 2.5 MG tablet, Take 2.5 mg by mouth daily., Disp: , Rfl:    metoprolol  succinate (TOPROL -XL) 25 MG 24 hr tablet, Take 0.5 tablets (12.5 mg total) by mouth daily., Disp: 8 tablet, Rfl: 0   montelukast  (SINGULAIR ) 10 MG tablet, Take 1 tablet daily for Allergies & Asthma, Disp: 90 tablet, Rfl: 3   olmesartan  (BENICAR ) 20 MG tablet, Take 1 tablet (20 mg total) by mouth daily., Disp: 90 tablet, Rfl: 1   Omega-3 Fatty Acids (FISH OIL PO), Take 1 capsule by mouth daily., Disp: , Rfl:    pantoprazole  (PROTONIX ) 40 MG tablet, TAKE 1 TABLET BY MOUTH EVERY DAY, Disp: 90 tablet, Rfl: 1   potassium chloride  (KLOR-CON ) 10 MEQ tablet, TAKE 1 TABLET BY MOUTH 2 TIMES DAILY., Disp: 60 tablet, Rfl: 11   rosuvastatin  (CRESTOR ) 10 MG tablet, TAKE 1 TABLET BY MOUTH EVERY DAY, Disp: 30 tablet, Rfl: 11   topiramate  (TOPAMAX ) 50 MG tablet, TAKE 1 TABLET 2 TIMES DAILY AT SUPPERTIME & BEDTIME FOR DIETING & WEIGHT LOSS, Disp: 180 tablet, Rfl: 3   triamcinolone  (KENALOG ) 0.025 % ointment, Apply 1 Application topically 2 (two) times daily., Disp: 30 g, Rfl: 0   valACYclovir  (VALTREX ) 500 MG tablet, Take  1 tablet  2 x /day as needed for Viral Suppression (Patient taking differently: as needed. Take  1 tablet  2 x /day as needed for Viral Suppression), Disp: 180 tablet, Rfl: 2    Review of Systems:   Negative unless otherwise specified per HPI.  Vitals:   Vitals:   08/12/23 0820  BP: 126/80  Pulse: 70  Temp: 97.9 F (36.6 C)  TempSrc: Temporal  SpO2: 98%  Weight: 137 lb 4 oz (62.3 kg)  Height: 5' (1.524 m)     Body mass index is 26.8 kg/m.  Physical Exam:   Physical Exam Vitals and nursing note reviewed.  Constitutional:      General: She is not in acute distress.    Appearance: She is well-developed. She is not ill-appearing or toxic-appearing.  Cardiovascular:     Rate and Rhythm: Normal rate and regular rhythm.     Pulses: Normal pulses.     Heart sounds: Normal heart sounds, S1 normal and S2 normal.  Pulmonary:     Effort: Pulmonary effort is normal.     Breath sounds: Normal breath sounds.  Feet:     Comments: Left foot with mild ecchymosis and tenderness to palpation to dorsal aspect  Skin:    General: Skin is warm and dry.  Neurological:     Mental Status: She is alert.     GCS: GCS eye subscore is 4. GCS verbal subscore is 5. GCS motor subscore is 6.  Psychiatric:        Speech: Speech normal.        Behavior: Behavior normal. Behavior is cooperative.     Assessment and Plan:    Assessment & Plan Left foot pain and injury Persistent pain and bruising suggest possible fracture or inflammation. - Order X-ray of the left foot to assess for fracture or other injury.  Hypertension Blood pressure readings variable, controlled with amlodipine . - Continue current antihypertensive regimen.  Gastroesophageal reflux disease Heartburn symptoms improved. Continue protonix  40  mg daily  Depression, stable on therapy Depression well-managed with escitalopram .  Vitamin D  toxicity, resolved Resolved after discontinuation of supplementation. - Order vitamin D  level to ensure resolution of toxicity.     Lucie Buttner, PA-C

## 2023-08-12 NOTE — Telephone Encounter (Signed)
 CRITICAL VALUE STICKER  CRITICAL VALUE: Vitamin D  greater than 120  RECEIVER (on-site recipient of call): Waddell Salles  DATE & TIME NOTIFIED: 08/12/2023 4:16pm  MESSENGER (representative from lab): Carmina ([facility])  MD NOTIFIED: Lucie Buttner   TIME OF NOTIFICATION: 4:27pm  RESPONSE: Noted. Will address tomorrow.

## 2023-08-13 ENCOUNTER — Ambulatory Visit: Payer: Self-pay | Admitting: Physician Assistant

## 2023-08-18 ENCOUNTER — Encounter: Payer: Self-pay | Admitting: Physician Assistant

## 2023-08-25 DIAGNOSIS — M9901 Segmental and somatic dysfunction of cervical region: Secondary | ICD-10-CM | POA: Diagnosis not present

## 2023-09-15 ENCOUNTER — Other Ambulatory Visit: Payer: Self-pay | Admitting: Physician Assistant

## 2023-09-15 NOTE — Telephone Encounter (Unsigned)
 Copied from CRM [id number]. Topic: Clinical - Medication Refill >> Sep 15, 2023  3:38 PM Macario HERO wrote: Medication: acyclovir  ointment (ZOVIRAX ) 5 % [[id number]]  Has the patient contacted their pharmacy? Yes (Agent: If no, request that the patient contact the pharmacy for the refill. If patient does not wish to contact the pharmacy document the reason why and proceed with request.) (Agent: If yes, when and what did the pharmacy advise?)  This is the patient's preferred pharmacy:  [id number] - [place], [place] - [address] [place] [address] Phone: [phone] Fax: [phone]  Is this the correct pharmacy for this prescription? Yes If no, delete pharmacy and type the correct one.   Has the prescription been filled recently? No  Is the patient out of the medication? Yes  Has the patient been seen for an appointment in the last year OR does the patient have an upcoming appointment? Yes  Can we respond through MyChart? No  Agent: Please be advised that Rx refills may take up to 3 business days. We ask that you follow-up with your pharmacy.

## 2023-09-16 MED ORDER — ACYCLOVIR 5 % EX OINT: 1.0000 | TOPICAL_OINTMENT | CUTANEOUS | 1 refills | Status: DC

## 2023-09-16 NOTE — Telephone Encounter (Signed)
 Filled by historical provider... ok to refill

## 2023-09-17 ENCOUNTER — Telehealth: Payer: Self-pay

## 2023-09-17 ENCOUNTER — Other Ambulatory Visit (HOSPITAL_COMMUNITY): Payer: Self-pay

## 2023-09-17 DIAGNOSIS — B009 Herpesviral infection, unspecified: Secondary | ICD-10-CM

## 2023-09-17 MED ORDER — VALACYCLOVIR HCL 500 MG PO TABS: ORAL_TABLET | ORAL | 1 refills | Status: DC

## 2023-09-17 NOTE — Telephone Encounter (Signed)
 Pharmacy Patient Advocate Encounter  Received notification from [facility] that Prior Authorization for Acyclovir  5% ointment has been DENIED.  Full denial letter will be uploaded to the media tab. See denial reason below.     PA #/Case ID/Reference #: [id number]

## 2023-09-17 NOTE — Telephone Encounter (Signed)
 Pharmacy Patient Advocate Encounter   Received notification from Onbase that prior authorization for Acyclovir  5% ointment is required/requested.   Insurance verification completed.   The patient is insured through [employer] .   Per test claim: PA required; PA submitted to above mentioned insurance via Latent Key/confirmation #/[id number] Status is pending

## 2023-09-17 NOTE — Telephone Encounter (Signed)
 Spoke to pt told her Acyclovir  ointment was not covered by insurance. Pt said she needs oral medication that is in her chart. Do you mean the Valtrex ? Pt said yes. Told her I will send Rx to the pharmacy. Pt verbalized understanding.

## 2023-09-17 NOTE — Addendum Note (Signed)
 Addended by: THURMON ARLAND PARAS on: 09/17/2023 04:15 PM   Modules accepted: Orders

## 2023-09-17 NOTE — Telephone Encounter (Signed)
Gail Bailey, please see message and advise. 

## 2023-09-30 ENCOUNTER — Telehealth: Payer: Self-pay | Admitting: Cardiovascular Disease

## 2023-09-30 MED ORDER — POTASSIUM CHLORIDE ER 10 MEQ PO TBCR: 10.0000 meq | EXTENDED_RELEASE_TABLET | Freq: Two times a day (BID) | ORAL | 0 refills | Status: AC

## 2023-09-30 NOTE — Telephone Encounter (Signed)
 Pt's medication was sent to pt's pharmacy as requested. Confirmation received.

## 2023-09-30 NOTE — Telephone Encounter (Signed)
*  STAT* If patient is at the pharmacy, call can be transferred to refill team.   1. Which medications need to be refilled? (please list name of each medication and dose if known)   potassium chloride  (KLOR-CON ) 10 MEQ tablet   2. Would you like to learn more about the convenience, safety, & potential cost savings by using the [facility]?   3. Are you open to using the [facility] (Type [facility]. ).  4. Which pharmacy/location (including street and city if local pharmacy) is medication to be sent to?  [facility] - [place], [place] - [address]   5. Do they need a 30 day or 90 day supply?   90 day

## 2023-10-05 ENCOUNTER — Other Ambulatory Visit: Payer: Self-pay

## 2023-10-05 MED ORDER — OLMESARTAN MEDOXOMIL 20 MG PO TABS: 20.0000 mg | ORAL_TABLET | Freq: Every day | ORAL | 0 refills | Status: DC

## 2023-10-07 ENCOUNTER — Other Ambulatory Visit: Payer: Self-pay

## 2023-10-11 ENCOUNTER — Telehealth: Payer: Self-pay | Admitting: Cardiovascular Disease

## 2023-10-11 NOTE — Telephone Encounter (Signed)
 Spoke with Josette, PhT with [facility] and verified that the potassium rx is or 10 mEq BID.

## 2023-10-11 NOTE — Telephone Encounter (Signed)
 Pt c/o medication issue:  1. Name of Medication:   potassium chloride  (KLOR-CON ) 10 MEQ tablet   2. How are you currently taking this medication (dosage and times per day)?   3. Are you having a reaction (difficulty breathing--STAT)?   4. What is your medication issue?   Caller Oscar) stated they will need a call back with instructions for this medication.  Caller stated they will need the instructions within a business day or prescription will be canceled.  Caller provided reference# [id number].

## 2023-10-17 ENCOUNTER — Encounter: Payer: Self-pay | Admitting: Physician Assistant

## 2023-10-17 DIAGNOSIS — M79672 Pain in left foot: Secondary | ICD-10-CM

## 2023-10-18 NOTE — Addendum Note (Signed)
 Addended by: THURMON ARLAND PARAS on: 10/18/2023 12:16 PM   Modules accepted: Orders

## 2023-10-20 ENCOUNTER — Ambulatory Visit
Admission: RE | Admit: 2023-10-20 | Discharge: 2023-10-20 | Disposition: A | Discharge status: Home / Self Care | Source: Ambulatory Visit | Attending: Obstetrics & Gynecology | Admitting: Obstetrics & Gynecology

## 2023-10-20 DIAGNOSIS — R928 Other abnormal and inconclusive findings on diagnostic imaging of breast: Secondary | ICD-10-CM

## 2023-10-20 DIAGNOSIS — N6320 Unspecified lump in the left breast, unspecified quadrant: Secondary | ICD-10-CM

## 2023-10-20 DIAGNOSIS — N6002 Solitary cyst of left breast: Secondary | ICD-10-CM | POA: Diagnosis not present

## 2023-10-28 ENCOUNTER — Ambulatory Visit: Admitting: Family Medicine

## 2023-10-28 ENCOUNTER — Other Ambulatory Visit: Payer: Self-pay

## 2023-10-28 VITALS — BP 120/70 | HR 73 | Ht 60.0 in | Wt 144.8 lb

## 2023-10-28 DIAGNOSIS — M79672 Pain in left foot: Secondary | ICD-10-CM

## 2023-10-28 NOTE — Progress Notes (Signed)
   I, Claretha Schimke am a scribe for Dr. Artist Lloyd, MD.  Gail Bailey is a 63 y.o. female who presents to [facility] at [facility] today for L foot/ankle pain ongoing since July. Pt notes she suffered a fall about three weeks ago in [place] at a meeting. Twiested left ankle and went down on left elbow and left hip.  Pt locates pain to knot in the arch area and the pain goes across the foot horizontally.   Had an injection in her foot some time ago. Drop a heavy object on her foot. Went to the doctor and they stated that their was a fracture there that was healing.   3 1/2 weeks ago sprained her ankle. On shoes she can wear is her [employer] shoes. Aching in arch of the foot.   Swelling: yes Treatments tried: ice, tylenol , salon pause patches  Dx imaging: 08/12/23 L foot XR  06/04/23 L foot XR  Pertinent review of systems: No fevers or chills  Relevant historical information: Hypertension   Exam:  BP 120/70   Pulse 73   Ht 5' (1.524 m)   Wt 144 lb 12.8 oz (65.7 kg)   SpO2 97%   BMI 28.28 kg/m  General: Well Developed, well nourished, and in no acute distress.   MSK: Left foot and ankle some hypopigmentation medial midfoot.  Tender palpation dorsal medial midfoot and at lateral ankle.  Normal foot and ankle motion.        Assessment and Plan: 63 y.o. female with left foot and ankle pain predominantly at medial midfoot and lateral ankle.  Not improving with typical conservative management.  Plan for MRI of the ankle including the midfoot.  Recheck after MRI.   PDMP not reviewed this encounter. Orders Placed This Encounter  Procedures   MR ANKLE LEFT WO CONTRAST    Include midfoot    Standing Status:   Future    Expiration Date:   10/27/2024    What is the patient's sedation requirement?:   No Sedation    Does the patient have a pacemaker or implanted devices?:   No    Preferred imaging location?:   [facility]   No orders of the defined  types were placed in this encounter.    Discussed warning signs or symptoms. Please see discharge instructions. Patient expresses understanding.   The above documentation has been reviewed and is accurate and complete Artist Lloyd, M.D.

## 2023-10-28 NOTE — Patient Instructions (Signed)
 Thank you for coming in today.   You should hear from MRI scheduling within 1 week. If you do not hear please let me know.    Check back after we get the MRI results back

## 2023-10-29 ENCOUNTER — Other Ambulatory Visit: Payer: Self-pay | Admitting: Gastroenterology

## 2023-10-29 ENCOUNTER — Other Ambulatory Visit: Payer: Self-pay | Admitting: Nurse Practitioner

## 2023-10-29 DIAGNOSIS — Z79899 Other long term (current) drug therapy: Secondary | ICD-10-CM

## 2023-11-05 ENCOUNTER — Other Ambulatory Visit: Payer: Self-pay | Admitting: Physician Assistant

## 2023-11-05 DIAGNOSIS — Z79899 Other long term (current) drug therapy: Secondary | ICD-10-CM

## 2023-11-05 NOTE — Telephone Encounter (Signed)
 Copied from CRM [id number]. Topic: Clinical - Medication Refill >> Nov 05, 2023 11:38 AM Nessti S wrote: Medication: topiramate  (TOPAMAX ) 50 MG tablet, rosuvastatin  (CRESTOR ) 10 MG tablet and potassium chloride  (KLOR-CON ) 10 MEQ tablet  Has the patient contacted their pharmacy? Yes (Agent: If no, request that the patient contact the pharmacy for the refill. If patient does not wish to contact the pharmacy document the reason why and proceed with request.) (Agent: If yes, when and what did the pharmacy advise?)  This is the patient's preferred pharmacy:  [id number] [place], [place] - [address] [place] [address] Phone: [phone] Fax: [phone]  Is this the correct pharmacy for this prescription? Yes If no, delete pharmacy and type the correct one.   Has the prescription been filled recently? No  Is the patient out of the medication? Yes  Has the patient been seen for an appointment in the last year OR does the patient have an upcoming appointment? Yes  Can we respond through MyChart? Yes  Agent: Please be advised that Rx refills may take up to 3 business days. We ask that you follow-up with your pharmacy.

## 2023-11-05 NOTE — Telephone Encounter (Signed)
 Pt requesting refills for Topamax  50 mg. Please advise if okay to fill?

## 2023-11-08 MED ORDER — TOPIRAMATE 50 MG PO TABS: 50.0000 mg | ORAL_TABLET | Freq: Two times a day (BID) | ORAL | 0 refills | Status: DC

## 2023-11-08 NOTE — Addendum Note (Signed)
 Addended by: THURMON ARLAND PARAS on: 11/08/2023 04:13 PM   Modules accepted: Orders

## 2023-11-08 NOTE — Telephone Encounter (Signed)
 Please see pts response

## 2023-11-08 NOTE — Telephone Encounter (Signed)
 Rx for Topamax  50 mg twice a day for Migraines [facility]

## 2023-11-09 ENCOUNTER — Other Ambulatory Visit: Payer: Self-pay | Admitting: Physician Assistant

## 2023-11-09 ENCOUNTER — Telehealth: Payer: Self-pay | Admitting: Physician Assistant

## 2023-11-09 ENCOUNTER — Ambulatory Visit
Admission: RE | Admit: 2023-11-09 | Discharge: 2023-11-09 | Disposition: A | Discharge status: Home / Self Care | Source: Ambulatory Visit | Attending: Family Medicine | Admitting: Family Medicine

## 2023-11-09 ENCOUNTER — Other Ambulatory Visit

## 2023-11-09 DIAGNOSIS — M19072 Primary osteoarthritis, left ankle and foot: Secondary | ICD-10-CM | POA: Diagnosis not present

## 2023-11-09 DIAGNOSIS — M79672 Pain in left foot: Secondary | ICD-10-CM

## 2023-11-09 DIAGNOSIS — M25572 Pain in left ankle and joints of left foot: Secondary | ICD-10-CM | POA: Diagnosis not present

## 2023-11-09 NOTE — Telephone Encounter (Signed)
 Copied from CRM #[id number]. Topic: Clinical - Medication Question >> Nov 09, 2023 12:53 PM Alfonso ORN wrote: Reason for CRM: Pt would like callback [phone] ([phone]) for clarification on what medications she should currently be taking. Specifically which calcium  rx is to be filled. Please advise

## 2023-11-09 NOTE — Telephone Encounter (Unsigned)
 Copied from CRM [id number]. Topic: Clinical - Medication Refill >> Nov 09, 2023 12:50 PM Alfonso ORN wrote: Medication:  furosemide  (LASIX ) 40 MG tablet   rosuvastatin  (CRESTOR ) 10 MG tablet Calcium      Has the patient contacted their pharmacy? No   This is the patient's preferred pharmacy:  [id number] [place], [place] - [address] [place] [address] Phone: [phone] Fax: [phone]  Is this the correct pharmacy for this prescription? Yes If no, delete pharmacy and type the correct one.   Has the prescription been filled recently? Yes  Is the patient out of the medication? Yes  Has the patient been seen for an appointment in the last year OR does the patient have an upcoming appointment? Yes  Can we respond through MyChart? Yes

## 2023-11-10 ENCOUNTER — Other Ambulatory Visit: Payer: Self-pay | Admitting: *Deleted

## 2023-11-10 MED ORDER — FUROSEMIDE 40 MG PO TABS: ORAL_TABLET | ORAL | 3 refills | Status: DC

## 2023-11-10 MED ORDER — ROSUVASTATIN CALCIUM 10 MG PO TABS: 10.0000 mg | ORAL_TABLET | Freq: Every day | ORAL | 11 refills | Status: DC

## 2023-11-10 NOTE — Telephone Encounter (Signed)
 Spoke with patient, patient has question about Rx Olmesartan , advise to call her cardiology for advise of Rx  Verbalized understanding

## 2023-11-11 ENCOUNTER — Telehealth: Payer: Self-pay

## 2023-11-11 ENCOUNTER — Other Ambulatory Visit (HOSPITAL_COMMUNITY): Payer: Self-pay

## 2023-11-11 ENCOUNTER — Other Ambulatory Visit: Payer: Self-pay

## 2023-11-11 MED ORDER — OLMESARTAN MEDOXOMIL 20 MG PO TABS
20.0000 mg | ORAL_TABLET | Freq: Every day | ORAL | 0 refills | Status: AC
  Filled 2023-11-11: qty 30, 30d supply, fill #0

## 2023-11-11 NOTE — Telephone Encounter (Signed)
*  STAT* If patient is at the pharmacy, call can be transferred to refill team.   1. Which medications need to be refilled? (please list name of each medication and dose if known)    olmesartan  (BENICAR ) 20 MG tablet     2. Would you like to learn more about the convenience, safety, & potential cost savings by using the [facility]?   3. Are you open to using the [facility] (Type [facility]  ).   4. Which pharmacy/location (including street and city if local pharmacy) is medication to be sent to? [id number] [place], [place] - [address]    5. Do they need a 30 day or 90 day supply? 90 day

## 2023-11-15 ENCOUNTER — Ambulatory Visit: Payer: Self-pay | Admitting: Family Medicine

## 2023-11-15 NOTE — Progress Notes (Signed)
 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  Recommend return to clinic to go over the results of full detail and discuss treatment plan and options from here.

## 2023-11-18 ENCOUNTER — Other Ambulatory Visit: Payer: Self-pay

## 2023-11-23 ENCOUNTER — Other Ambulatory Visit (HOSPITAL_COMMUNITY): Payer: Self-pay

## 2023-11-25 ENCOUNTER — Ambulatory Visit

## 2023-11-25 ENCOUNTER — Telehealth (HOSPITAL_COMMUNITY): Payer: Self-pay | Admitting: *Deleted

## 2023-11-25 VITALS — BP 134/68 | HR 75 | Ht 60.0 in | Wt 142.0 lb

## 2023-11-25 DIAGNOSIS — I10 Essential (primary) hypertension: Secondary | ICD-10-CM | POA: Diagnosis not present

## 2023-11-25 DIAGNOSIS — E782 Mixed hyperlipidemia: Secondary | ICD-10-CM | POA: Diagnosis not present

## 2023-11-25 DIAGNOSIS — Z8673 Personal history of transient ischemic attack (TIA), and cerebral infarction without residual deficits: Secondary | ICD-10-CM

## 2023-11-25 DIAGNOSIS — I2510 Atherosclerotic heart disease of native coronary artery without angina pectoris: Secondary | ICD-10-CM

## 2023-11-25 DIAGNOSIS — Z79899 Other long term (current) drug therapy: Secondary | ICD-10-CM

## 2023-11-25 MED ORDER — FUROSEMIDE 40 MG PO TABS: 40.0000 mg | ORAL_TABLET | Freq: Every day | ORAL | 1 refills | Status: DC

## 2023-11-25 NOTE — Telephone Encounter (Signed)
 Left detailed instructions for MPI study on home vm per DPR.

## 2023-11-25 NOTE — Patient Instructions (Addendum)
 Medication Instructions:  Your physician has recommended you make the following change in your medication:  INCREASE Lasxi to 40 mg TWICE daily  *If you need a refill on your cardiac medications before your next appointment, please call your pharmacy*  Lab Work: Today: A1C, Lipid panel and BNP  Your physician recommends that you return for lab work in: 1 week for a BMP & Magnsium  If you have any lab test that is abnormal or we need to change your treatment, we will call you to review the results.  Testing/Procedures: Your physician has requested that you have an echocardiogram. Echocardiography is a painless test that uses sound waves to create images of your heart. It provides your doctor with information about the size and shape of your heart and how well your heart's chambers and valves are working. This procedure takes approximately one hour. There are no restrictions for this procedure. Please do NOT wear cologne, perfume, aftershave, or lotions (deodorant is allowed). Please arrive 15 minutes prior to your appointment time.  Please note: We ask at that you not bring children with you during ultrasound (echo/ vascular) testing. Due to room size and safety concerns, children are not allowed in the ultrasound rooms during exams. Our front office staff cannot provide observation of children in our lobby area while testing is being conducted. An adult accompanying a patient to their appointment will only be allowed in the ultrasound room at the discretion of the ultrasound technician under special circumstances. We apologize for any inconvenience.   Your physician has requested that you have en exercise stress myoview . For further information please visit [email or web address] Please follow instruction sheet, as given.   Follow-Up: At [facility], you and your health needs are our priority.  As part of our continuing mission to provide you with exceptional heart care, our  providers are all part of one team.  This team includes your primary Cardiologist (physician) and Advanced Practice Providers or APPs (Physician Assistants and Nurse Practitioners) who all work together to provide you with the care you need, when you need it.  Your next appointment:   2 month(s)  Provider:   Dr. Azobou    Thank you for choosing [facility]!!   [phone]   Other Instructions

## 2023-11-25 NOTE — Progress Notes (Signed)
    Cardiology Office Note Date:  11/25/2023  ID:  Jaylianna Tatlock, DOB Dec 28, 1960, MRN [id number] PCP:  Job Lukes, PA  Cardiologist:   Joelle VEAR Ren Donley, MD  Chief Complaint  Patient presents with   Follow-up      Problems Non-obstructive CAD Coronary CTA 4/22- 132 (93rd percentile), non-obstructive CAD TTE 5/22: 50-55%, GIDD, mod LA (from 40-45%, apical/ant-septal HK-likely LBBB 08/2014) TIA HTN/HLD M: AE5, ASA81, FE40, XL12.5, Olmesartan  20, RN10 LDL 51 4/25, TG 214  Visits  11/25: HA1C, LP, BNP, 2D Echo, lasix  to 40 BID, BMP/Mag in 2 weeks, ETT Myoview     History of Present Illness: Gail Bailey is a 63 y.o. female who presents for follow up.   She is reporting worsening dyspnea over the last couple of months.  She has noticed that when she climbs a flight of stairs or carry heavy boxes, she feels very short of breath.  She also reports some associated chest heaviness.  She also is also reports orthopnea, but denies any PND.  She has also noticed worsening lower extremity edema over the last couple of weeks that is worse at the end of the day.  It used to be controlled on her Lasix  40 daily, but she feels like diuretic is not working as well.  She remains very active walking 10-15,000 steps at work.  ROS: Please see the history of present illness. All other systems are reviewed and negative.    PHYSICAL EXAM: VS:  BP 134/68 (BP Location: Left Arm, Patient Position: Sitting, Cuff Size: Normal)   Pulse 75   Ht 5' (1.524 m)   Wt 142 lb (64.4 kg)   SpO2 99%   BMI 27.73 kg/m  , BMI Body mass index is 27.73 kg/m. GEN: Well nourished, well developed, in no acute distress HEENT: normal Neck: no JVD, carotid bruits, or masses Cardiac: RRR; no murmurs, rubs, or gallops,no edema  Respiratory:  CTAB bilaterally, normal work of breathing GI: soft, nontender, nondistended, + BS Extremities: No LE edema Skin: warm and dry, no rash Neuro:  Strength and sensation are  intact  Recent Labs: Reviewed  Studies: Reviewed  ASSESSMENT AND PLAN: Gail Bailey is a 63 y.o. female who presents for follow up. - Regarding her worsening dyspnea and orthopnea, it sounds like clinical CHF.  Will increase her Lasix  to 40 twice daily and repeat BMP and mag in a week.  For further workup, we will obtain BNP, 2D echo as well as exercise SPECT test. - We will check lipid panel and A1c as well. - We will see her back in 2 months.   Signed, Joelle VEAR Ren Donley, MD  11/25/2023 10:56 AM    [facility]

## 2023-11-26 ENCOUNTER — Ambulatory Visit: Payer: Self-pay

## 2023-11-26 DIAGNOSIS — I2510 Atherosclerotic heart disease of native coronary artery without angina pectoris: Secondary | ICD-10-CM

## 2023-11-26 LAB — HEMOGLOBIN A1C
Est. average glucose Bld gHb Est-mCnc: 126 mg/dL
Hgb A1c MFr Bld: 6 % — ABNORMAL HIGH (ref 4.8–5.6)

## 2023-11-26 LAB — LIPID PANEL
Chol/HDL Ratio: 4.4 ratio (ref 0.0–4.4)
Cholesterol, Total: 154 mg/dL (ref 100–199)
HDL: 35 mg/dL — ABNORMAL LOW (ref >39)
LDL Chol Calc (NIH): 75 mg/dL (ref 0–99)
Triglycerides: 266 mg/dL — ABNORMAL HIGH (ref 0–149)
VLDL Cholesterol Cal: 44 mg/dL — ABNORMAL HIGH (ref 5–40)

## 2023-11-26 MED ORDER — ROSUVASTATIN CALCIUM 20 MG PO TABS: 20.0000 mg | ORAL_TABLET | Freq: Every day | ORAL | 3 refills | Status: AC

## 2023-11-29 ENCOUNTER — Other Ambulatory Visit: Payer: Self-pay

## 2023-11-29 DIAGNOSIS — I2510 Atherosclerotic heart disease of native coronary artery without angina pectoris: Secondary | ICD-10-CM

## 2023-12-01 NOTE — Addendum Note (Signed)
 Addended by: GRETEL MAEOLA CROME on: 12/01/2023 02:12 PM   Modules accepted: Orders

## 2023-12-01 NOTE — Addendum Note (Signed)
 Addended by: REN DONLEY PRADER on: 12/01/2023 02:17 PM   Modules accepted: Orders

## 2023-12-02 ENCOUNTER — Other Ambulatory Visit: Payer: Self-pay

## 2023-12-02 ENCOUNTER — Ambulatory Visit (HOSPITAL_COMMUNITY)
Admission: RE | Admit: 2023-12-02 | Discharge: 2023-12-02 | Disposition: A | Discharge status: Home / Self Care | Source: Ambulatory Visit | Attending: Student in an Organized Health Care Education/Training Program | Admitting: Student in an Organized Health Care Education/Training Program

## 2023-12-02 DIAGNOSIS — I2510 Atherosclerotic heart disease of native coronary artery without angina pectoris: Secondary | ICD-10-CM | POA: Diagnosis not present

## 2023-12-02 DIAGNOSIS — Z79899 Other long term (current) drug therapy: Secondary | ICD-10-CM | POA: Diagnosis not present

## 2023-12-02 LAB — MYOCARDIAL PERFUSION IMAGING
Angina Index: 0
Duke Treadmill Score: 9
Estimated workload: 10.1
Exercise duration (min): 8 min
Exercise duration (sec): 30 s
LV dias vol: 84 mL (ref 46–106)
LV sys vol: 36 mL (ref 3.8–5.2)
MPHR: 158 {beats}/min
Nuc Stress EF: 69 %
Peak HR: 155 {beats}/min
Percent HR: 98 %
RPE: 18
Rest HR: 61 {beats}/min
Rest Nuclear Isotope Dose: 11 mCi
SDS: 1
SRS: 3
SSS: 2
ST Depression (mm): 0 mm
Stress Nuclear Isotope Dose: 31.8 mCi
TID: 0.87

## 2023-12-02 MED ORDER — TECHNETIUM TC 99M TETROFOSMIN IV KIT
11.0000 | PACK | Freq: Once | INTRAVENOUS | Status: AC | PRN
Start: 2023-12-02 — End: 2023-12-02
  Administered 2023-12-02: 11 via INTRAVENOUS

## 2023-12-02 MED ORDER — TECHNETIUM TC 99M TETROFOSMIN IV KIT
31.8000 | PACK | Freq: Once | INTRAVENOUS | Status: AC | PRN
  Administered 2023-12-02: 31.8 via INTRAVENOUS

## 2023-12-03 ENCOUNTER — Ambulatory Visit: Payer: Self-pay

## 2023-12-03 LAB — BASIC METABOLIC PANEL WITH GFR
BUN/Creatinine Ratio: 17 (ref 12–28)
BUN: 11 mg/dL (ref 8–27)
CO2: 24 mmol/L (ref 20–29)
Calcium: 8.9 mg/dL (ref 8.7–10.3)
Chloride: 101 mmol/L (ref 96–106)
Creatinine, Ser: 0.66 mg/dL (ref 0.57–1.00)
Glucose: 123 mg/dL — ABNORMAL HIGH (ref 70–99)
Potassium: 3.6 mmol/L (ref 3.5–5.2)
Sodium: 140 mmol/L (ref 134–144)
eGFR: 99 mL/min/1.73 (ref >59)

## 2023-12-03 LAB — MAGNESIUM: Magnesium: 2.2 mg/dL (ref 1.6–2.3)

## 2023-12-03 NOTE — Telephone Encounter (Signed)
 I discussed Gail Bailey to discuss the results of her nuclear stress test.  I explained that she had no evidence of ischemia and no calcium  on CT.  She reports improvement in her dyspnea since starting the Lasix .  I suspect that her symptoms is likely due to HFpEF, but will await echo results.  She has not been metoprolol  for few weeks, but given that she is actually felt better, we will hold until the echo results come back.  Gail DEL. Ren Ny, MD [facility]

## 2023-12-06 ENCOUNTER — Other Ambulatory Visit: Payer: Self-pay

## 2023-12-06 DIAGNOSIS — R0789 Other chest pain: Secondary | ICD-10-CM | POA: Diagnosis not present

## 2023-12-06 MED ORDER — FUROSEMIDE 40 MG PO TABS: 40.0000 mg | ORAL_TABLET | Freq: Every day | ORAL | 1 refills | Status: DC

## 2023-12-06 NOTE — Telephone Encounter (Signed)
 [facility] Dr[facility] Azobou Tonleu started pt on a new medication and they don't have that medication. I do not see a new medication, it looks like he increased pt's medication furosemide  40 mg to BID and this was not done. Please address

## 2023-12-07 ENCOUNTER — Ambulatory Visit: Admitting: Physician Assistant

## 2023-12-24 ENCOUNTER — Ambulatory Visit: Admitting: Family Medicine

## 2023-12-24 ENCOUNTER — Encounter: Payer: Self-pay | Admitting: Family Medicine

## 2023-12-24 ENCOUNTER — Ambulatory Visit

## 2023-12-24 VITALS — Ht 60.0 in

## 2023-12-24 DIAGNOSIS — R0789 Other chest pain: Secondary | ICD-10-CM

## 2023-12-24 DIAGNOSIS — R0602 Shortness of breath: Secondary | ICD-10-CM | POA: Diagnosis not present

## 2023-12-24 DIAGNOSIS — M79672 Pain in left foot: Secondary | ICD-10-CM

## 2023-12-24 DIAGNOSIS — S299XXA Unspecified injury of thorax, initial encounter: Secondary | ICD-10-CM | POA: Diagnosis not present

## 2023-12-24 MED ORDER — ALBUTEROL SULFATE HFA 108 (90 BASE) MCG/ACT IN AERS: 2.0000 | INHALATION_SPRAY | Freq: Four times a day (QID) | RESPIRATORY_TRACT | 1 refills | Status: DC | PRN

## 2023-12-24 NOTE — Patient Instructions (Signed)
 Thank you for coming in today.

## 2023-12-24 NOTE — Progress Notes (Signed)
 I, Leotis Batter, CMA acting as a scribe for Artist Lloyd, MD.  Gail Bailey is a 63 y.o. female who presents to [facility] at [facility] today for f/u L foot/ankle pain w/ MRI review. Pt was last seen by Dr. Lloyd on 10/28/23 and MRI was ordered.  Today, pt reports some continued ankle and foot pain.  Overall the ankle foot pain is okay.  She notes previous injections have been somewhat mediocre.  Larger issue today is shortness of breath and rib pain.  She fell 3 weeks ago landing on her left side against a banister injuring her ribs.  She did have x-rays in urgent care that did not show a rib fracture.  She notes continued rib pain but more alarmingly is shortness of breath especially with exertion.  She notes this is worsening a little bit.  She also notes some coughing and wheezing.  She does have a history of asthma which typically is pretty well-controlled.  She does have heart disease but just had a heart workup that looked pretty normal.  Dx imaging: 11/09/23 L ankle MRI 08/12/23 L foot XR             06/04/23 L foot XR  Pertinent review of systems: No fevers or chills.  Positive for shortness of breath and left rib pain  Relevant historical information: Tension.  Systolic heart failure currently well-controlled.   Exam:  Ht 5' (1.524 m)   BMI 27.73 kg/m  General: Well Developed, well nourished, and in no acute distress.   Lungs: Clear to auscultation bilaterally.  Some wheezing present with expiration.  Lung sounds are equal and present bilaterally.  Heart regular rate and rhythm no murmurs rubs or gallops.  Chest wall tender palpation left lateral inferior chest wall.  Left foot and ankle some swelling across midfoot.  Tender palpation dorsal midfoot.   Lab and Radiology Results  View chest x-ray images obtained today personally and independently interpreted. No large infiltrate pneumothorax or pleural lesion is present. No large displaced rib  fractures visible provide interpretation. Await formal radiology review.  EXAM: MRI OF THE LEFT ANKLE WITHOUT CONTRAST   TECHNIQUE: Multiplanar, multisequence MR imaging of the ankle was performed. No intravenous contrast was administered.   COMPARISON:  Left foot x-ray 08/12/2023   FINDINGS: TENDONS   Peroneal: Peroneus longus and brevis tendons are intact and normally positioned.   Posteromedial: Tibialis posterior, flexor hallucis longus, and flexor digitorum longus tendons are intact and normally positioned.   Anterior: Tibialis anterior, extensor hallucis longus, and extensor digitorum longus tendons are intact and normally positioned.   Achilles: Intact.   Plantar Fascia: Intact.   LIGAMENTS   Lateral: Intact tibiofibular ligaments. The anterior and posterior talofibular ligaments are intact. Intact calcaneofibular ligament.   Medial: The deltoid and visualized portions of the spring ligament appear intact.   Lisfranc: Intact.   CARTILAGE AND BONES   Ankle Joint: No significant ankle joint effusion. Partial thickness cartilage loss at the medial talar shoulder with underlying reactive subchondral marrow edema. No osteochondral lesion.   Subtalar Joints/Sinus Tarsi: No cartilage defect. No effusion. Preservation of the anatomic fat within the sinus tarsi.   Bones: No acute fracture. 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 Small plantar calcaneal spur. No suspicious bone lesion.   Other: No significant soft tissue findings.   IMPRESSION: 1. Pes planovalgus alignment. 2. 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 3. Mild tibiotalar osteoarthritis. 4. Intact ankle ligaments and tendons.     Electronically Signed   By: Mabel Converse D.O.   On: 11/12/2023 11:35 I, Artist Lloyd, personally (independently) visualized and  performed the interpretation of the images attached in this note.   Assessment and Plan: 63 y.o. female with follow-up left foot and ankle pain.  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  Plan to refer to podiatry.  She may benefit from orthotics or other injections beyond what I did in clinic.  More acutely today is rib pain and shortness of breath.  She had a fall landing on her left lateral ribs against the banister about 3 weeks ago.  This does explain her rib pain but her shortness of breath is more than I would expect.  She does have a history of asthma and heart disease.  Heart disease just had a workup that looked pretty good.  Asthma is a bit concerning.  Plan for chest x-ray as noted above.  Will prescribe albuterol  as well.  Recommend follow-up with PCP in the near future and have referred to pulmonology for long-term evaluation and treatment.  They benefit from a breathing study.  PDMP not reviewed this encounter. Orders Placed This Encounter  Procedures   DG Chest 2 View    Standing Status:   Future    Number of Occurrences:   1    Expiration Date:   01/24/2024    Reason for Exam (SYMPTOM  OR DIAGNOSIS REQUIRED):   fall, left side / rib injury.    Preferred imaging location?:   [facility]   Ambulatory referral to Pulmonology    Referral Priority:   Routine    Referral Type:   Consultation    Referral Reason:   Specialty Services Required    Requested Specialty:   Pulmonary Disease    Number of Visits Requested:   1   Meds ordered this encounter  Medications   albuterol  (VENTOLIN  HFA) 108 (90 Base) MCG/ACT inhaler    Sig: Inhale 2 puffs into the lungs every 6 (six) hours as needed for wheezing.    Dispense:  2 each    Refill:  1     Discussed warning signs or symptoms. Please see discharge instructions. Patient expresses understanding.   The above documentation has been reviewed and is accurate and complete Artist Lloyd, M.D.

## 2023-12-27 ENCOUNTER — Encounter: Payer: Self-pay | Admitting: Physician Assistant

## 2023-12-27 ENCOUNTER — Ambulatory Visit: Admitting: Physician Assistant

## 2023-12-27 VITALS — BP 136/80 | HR 70 | Temp 97.9°F | Ht 60.0 in | Wt 142.0 lb

## 2023-12-27 DIAGNOSIS — R0981 Nasal congestion: Secondary | ICD-10-CM | POA: Diagnosis not present

## 2023-12-27 DIAGNOSIS — Z23 Encounter for immunization: Secondary | ICD-10-CM

## 2023-12-27 DIAGNOSIS — J4531 Mild persistent asthma with (acute) exacerbation: Secondary | ICD-10-CM | POA: Diagnosis not present

## 2023-12-27 DIAGNOSIS — S298XXA Other specified injuries of thorax, initial encounter: Secondary | ICD-10-CM

## 2023-12-27 MED ORDER — BUDESONIDE-FORMOTEROL FUMARATE 80-4.5 MCG/ACT IN AERO: 2.0000 | INHALATION_SPRAY | Freq: Two times a day (BID) | RESPIRATORY_TRACT | 1 refills | Status: AC

## 2023-12-27 MED ORDER — AMOXICILLIN-POT CLAVULANATE 875-125 MG PO TABS: 1.0000 | ORAL_TABLET | Freq: Two times a day (BID) | ORAL | 0 refills | Status: DC

## 2023-12-27 NOTE — Progress Notes (Signed)
History of Present Illness:   Chief Complaint  Patient presents with   Fall    Pt fell over 3 weeks ago went to Urgent Care in [facility].  c/o having left rib pain. She Dr. Joane on 12/12 repeat chest x-ray.    Discussed the use of AI scribe software for clinical note transcription with the patient, who gave verbal consent to proceed.  History of Present Illness   Gail Bailey is a 63 year old female with asthma who presents with difficulty breathing following a fall over 3 weeks ago.  She developed difficulty breathing after missing a step while going downstairs and striking her chest on a wooden rail, which knocked the wind out of her. She has sharp pain with each breath and with exertion. She initially went to an Urgent Care and had evaluation, she had a reportedly negative xray. She then saw her sports medicine provider for this on 12/12 and was given the albuterol  and repeat CXR was performed. She was also referred to pulmonary.  She uses an albuterol  inhaler twice daily with partial relief of breathing symptoms but still has significant chest pain with sneezing and bending down. The pain is located behind her breast without radiation to her back. She avoids deep breaths due to pain and tends to breathe shallowly, though she periodically forces deeper breaths.  She has not used a steroid or other maintenance inhaler before this event. Since the fall she has developed new raspiness and congestion, and new nasal congestion over the past four days.  For pain she stopped a muscle relaxer due to sedation and now uses Tylenol . Bearing down is uncomfortable, and she denies constipation. She recalls one episode of tasting blood when winded and coughing after exertion but denies coughing up blood. She is not using other antihistamines and continues Singulair .        Past Medical History:  Diagnosis Date   Allergy    Anxiety    Asthma    CAD (coronary artery disease)    Cardiomyopathy  (HCC)    a. Echo 8/16:  EF 40-45%, apical and ant-septal HK, Gr 2 DD   Diverticula, colon    Family history of adverse reaction to anesthesia    sisiter had PONV   GAD (generalized anxiety disorder)    Headache    rare migraines   History of cardiovascular stress test    Myoview  8/16:  EF 48%, anterior and apical defect c/w breast atten, no ischemia; Low Risk // Nuclear stress test 9/18: Low risk stress nuclear study with fixed septal defect consistent with LBBB vs prior infarct; no ischemia; EF 53 with septal akinesis.   Hyperlipidemia    Hypertension    Irregular periods/menstrual cycles    LBBB (left bundle branch block)    Melanoma (HCC)    right arm   Seasonal allergies    TIA (transient ischemic attack)    Torn meniscus    Vitamin B12 deficiency    Vitamin D  deficiency      Social History[1]  Past Surgical History:  Procedure Laterality Date   ABLATION     BREAST REDUCTION SURGERY     CARDIAC CATHETERIZATION     CESAREAN SECTION     x2   KNEE SURGERY     partial knee replacement right   MELANOMA EXCISION WITH SENTINEL LYMPH NODE BIOPSY Right 09/15/2017   Procedure: BIOPSY RIGHT SHOULDER MOLE AND WIDE LOCAL EXCISION OF RIGHT ARM MELANOMA, ADVANCEMENT FLAP CLOSURE, SENTINEL  LYMPH NODE BIOPSY  RIGHT ARM MELANOMA;  Surgeon: Aron Shoulders, MD;  Location: [facility];  Service: General;  Laterality: Right;   REDUCTION MAMMAPLASTY Bilateral    REPAIR QUADRICEPS / HAMSTRING MUSCLE     REPLACEMENT TOTAL KNEE Left 09/20/2018   Dr. Viann at [facility]   TONSILLECTOMY     tubes in ears  10/12    Family History  Problem Relation Age of Onset   Diabetes Mother    Heart disease Mother    Hypertension Mother    Heart attack Mother 71   Other Father        MVA   Asthma Sister    Diabetes Sister    Cancer Maternal Grandmother        Uterine   Breast cancer Maternal Grandmother    Heart attack Paternal Grandmother    Asthma Son    Colon cancer Neg Hx    Colon  polyps Neg Hx    Esophageal cancer Neg Hx    Stomach cancer Neg Hx    Rectal cancer Neg Hx     Allergies[2]  Current Medications:  Current Medications[3]   Review of Systems:   Negative unless otherwise specified per HPI.  Vitals:   Vitals:   12/27/23 1119  BP: 136/80  Pulse: 70  Temp: 97.9 F (36.6 C)  TempSrc: Temporal  SpO2: 99%  Weight: 142 lb (64.4 kg)  Height: 5' (1.524 m)     Body mass index is 27.73 kg/m.  Physical Exam:   Physical Exam Vitals and nursing note reviewed.  Constitutional:      General: She is not in acute distress.    Appearance: She is well-developed. She is not ill-appearing or toxic-appearing.  Cardiovascular:     Rate and Rhythm: Normal rate and regular rhythm.     Pulses: Normal pulses.     Heart sounds: Normal heart sounds, S1 normal and S2 normal.  Pulmonary:     Effort: Pulmonary effort is normal.     Breath sounds: Normal breath sounds.  Chest:    Skin:    General: Skin is warm and dry.  Neurological:     Mental Status: She is alert.     GCS: GCS eye subscore is 4. GCS verbal subscore is 5. GCS motor subscore is 6.  Psychiatric:        Speech: Speech normal.        Behavior: Behavior normal. Behavior is cooperative.     Assessment and Plan:   Assessment and Plan    Contusion of rib, initial encounter  Suspected rib fracture post-fall. Pain with deep breathing. Awaiting chest x-ray results. - Continue Tylenol  for pain. - Advised regular deep breathing to prevent pneumonia. - Consider CT chest if no improvement, pending pulmonary referral.  Mild persistent asthma with acute exacerbation  Exacerbation likely due to rib injury and sinusitis. Increased albuterol  use with relief. No maintenance inhaler history. - Prescribed Symbicort  inhaler for maintenance, two puffs twice daily. - Instructed to rinse mouth post-inhaler use. - Continue albuterol  as needed; consider increasing steroid if frequent use. - Referred to  pulmonary specialist.  Nasal congestion Sinus congestion for four days, possible bacterial infection. No red flags on discussion, patient is not in any obvious distress during our visit. Discussed progression of most viral illness, and recommended supportive care at this point in time. I did however provide pocket rx for oral augmentin  should symptoms not improve as anticipated. Discussed over the counter supportive care  options, with recommendations to push fluids and rest. Reviewed return precautions including new/worsening fever, SOB, new/worsening cough or other concerns.  Recommended need to self-quarantine and practice social distancing until symptoms resolve. Discussed current recommendations for COVID testing. I recommend that patient follow-up if symptoms worsen or persist despite treatment x 7-10 days, sooner if needed.     General Health Maintenance Received flu shot.         Lucie Buttner, PA-C    [1]  Social History Tobacco Use   Smoking status: Never   Smokeless tobacco: Never  Vaping Use   Vaping status: Never Used  Substance Use Topics   Alcohol use: Yes    Alcohol/week: 3.0 - 4.0 standard drinks of alcohol    Types: 3 - 4 Glasses of wine per week   Drug use: No  [2]  Allergies Allergen Reactions   Metoprolol      Not allergy, but intolerance - had bradycardia with HR in the 40s even with low dose   Oxycodone  Itching    Itching: Trunk, palms of hands, legs, back    [3]  Current Outpatient Medications:    albuterol  (VENTOLIN  HFA) 108 (90 Base) MCG/ACT inhaler, Inhale 2 puffs into the lungs every 6 (six) hours as needed for wheezing., Disp: 2 each, Rfl: 1   amLODipine  (NORVASC ) 5 MG tablet, TAKE 1 TABLET BY MOUTH EVERY DAY FOR BLOOD PRESSURE, Disp: 90 tablet, Rfl: 3   amoxicillin -clavulanate (AUGMENTIN ) 875-125 MG tablet, Take 1 tablet by mouth 2 (two) times daily., Disp: 14 tablet, Rfl: 0   aspirin  EC 81 MG tablet, Take 1 tablet (81 mg total) by mouth  daily., Disp: , Rfl:    budesonide -formoterol  (SYMBICORT ) 80-4.5 MCG/ACT inhaler, Inhale 2 puffs into the lungs 2 (two) times daily., Disp: 1 each, Rfl: 1   escitalopram  (LEXAPRO ) 20 MG tablet, Take 1 tablet Daily for Mood & Anxiety, Disp: 90 tablet, Rfl: 1   estradiol  (ESTRACE ) 0.5 MG tablet, Take 0.5 mg by mouth daily., Disp: , Rfl:    fluticasone  (FLONASE ) 50 MCG/ACT nasal spray, Place 2 sprays into both nostrils as needed for allergies or rhinitis., Disp: , Rfl:    furosemide  (LASIX ) 40 MG tablet, Take 1 tablet (40 mg total) by mouth daily., Disp: 90 tablet, Rfl: 1   medroxyPROGESTERone  (PROVERA ) 2.5 MG tablet, Take 2.5 mg by mouth daily., Disp: , Rfl:    metoprolol  succinate (TOPROL -XL) 25 MG 24 hr tablet, Take 0.5 tablets (12.5 mg total) by mouth daily., Disp: 8 tablet, Rfl: 0   montelukast  (SINGULAIR ) 10 MG tablet, Take 1 tablet daily for Allergies & Asthma, Disp: 90 tablet, Rfl: 3   olmesartan  (BENICAR ) 20 MG tablet, Take 1 tablet (20 mg total) by mouth daily., Disp: 30 tablet, Rfl: 0   Omega-3 Fatty Acids (FISH OIL PO), Take 1 capsule by mouth daily., Disp: , Rfl:    pantoprazole  (PROTONIX ) 40 MG tablet, TAKE 1 TABLET BY MOUTH EVERY DAY, Disp: 90 tablet, Rfl: 1   potassium chloride  (KLOR-CON ) 10 MEQ tablet, Take 1 tablet (10 mEq total) by mouth 2 (two) times daily., Disp: 60 tablet, Rfl: 0   rosuvastatin  (CRESTOR ) 20 MG tablet, Take 1 tablet (20 mg total) by mouth daily., Disp: 90 tablet, Rfl: 3   topiramate  (TOPAMAX ) 50 MG tablet, Take 1 tablet (50 mg total) by mouth 2 (two) times daily. Migraines, Disp: 180 tablet, Rfl: 0   triamcinolone  (KENALOG ) 0.025 % ointment, Apply 1 Application topically 2 (two) times daily., Disp: 30 g, Rfl:  0 ° °

## 2023-12-30 ENCOUNTER — Ambulatory Visit: Admitting: Podiatry

## 2023-12-30 ENCOUNTER — Ambulatory Visit

## 2023-12-30 ENCOUNTER — Encounter: Payer: Self-pay | Admitting: Podiatry

## 2023-12-30 DIAGNOSIS — M19072 Primary osteoarthritis, left ankle and foot: Secondary | ICD-10-CM | POA: Diagnosis not present

## 2023-12-30 DIAGNOSIS — M7752 Other enthesopathy of left foot: Secondary | ICD-10-CM

## 2023-12-30 DIAGNOSIS — M779 Enthesopathy, unspecified: Secondary | ICD-10-CM

## 2023-12-30 DIAGNOSIS — M778 Other enthesopathies, not elsewhere classified: Secondary | ICD-10-CM

## 2023-12-30 DIAGNOSIS — M7751 Other enthesopathy of right foot: Secondary | ICD-10-CM

## 2024-01-02 NOTE — Progress Notes (Signed)
"  °  Subjective:  [name], female    DOB: 1960-10-23,  MRN: [id number]  Chief Complaint  Patient presents with   Foot Pain    Rm12 Patient complains of pain in left foot all over/ multiple injuries to foot since childhood    Discussed the use of AI scribe software for clinical note transcription with the patient, who gave verbal consent to proceed.  History of Present Illness Gail Bailey is a 63 year old female with arthritis who presents with chronic left foot pain.  She has chronic left foot pain across the dorsal midfoot. The pain is persistent and worsens with walking, and she now struggles to reach her usual daily walking distance.  She has had multiple left foot and ankle injuries, including a fracture diagnosed in July 2025 and an ankle sprain after a fall in August 2025. Pain from the sprain persists.  She has arthritis in the foot and had a prior injection with only short-term relief. She has not tried orthotics. She uses Tylenol , Voltaren gel, and pain patches, especially with increased walking. She also notes a bruise on the foot of unclear cause.  Her legs and ankles swell, worse by the end of the day with sock indentations, and she takes a diuretic for this. She previously had a knee replacement and a partial knee surgery.  She has cardiac disease that limits her ability to use anti-inflammatory medications. 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 She denies recent right foot injury.      Objective:  There were no vitals filed for this visit.  Physical Exam General: AAO x3, NAD  Dermatological: Skin is warm, dry and supple bilateral. There are no open sores, no preulcerative lesions, no rash or signs of infection present.  Vascular: Dorsalis Pedis artery and Posterior Tibial artery pedal pulses are 2/4 bilateral with immedate capillary fill time. There is no pain with calf compression, swelling, warmth, erythema.   Neruologic:  Grossly intact via light touch bilateral.   Musculoskeletal: Subjectively she gets tenderness along the dorsal aspect of the midfoot along the Lisfranc joint also to the plantar midfoot area.  There is no specific area of pinpoint tenderness.  Trace edema.  No erythema or warmth.  No significant pain on exam today.     No images are attached to the encounter.    Results Radiology Left foot x-ray: Osteoarthritis in the midfoot, particularly at the Lisfranc joint; joint space narrowing at the Lisfranc joint; osteoarthritis in the first metatarsophalangeal joint; dorsal osteophytes; plantar calcaneal spur (Independently interpreted)   Assessment:   1. Tendinitis   2. 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 X-rays show reduced joint space and bone spurs. Conservative management preferred. - Referred to radiation oncology for low dose radiation therapy evaluation. - Measured for custom orthotic inserts. - Continue Voltaren gel and pain patches as needed. - Consider repeat injection if needed.      No follow-ups on file.   Gail Bailey DPM  "

## 2024-01-03 ENCOUNTER — Ambulatory Visit: Payer: Self-pay | Admitting: Family Medicine

## 2024-01-03 NOTE — Progress Notes (Signed)
 Chest x-ray looks okay to radiology.

## 2024-01-04 ENCOUNTER — Telehealth: Payer: Self-pay | Admitting: Radiation Oncology

## 2024-01-04 NOTE — Telephone Encounter (Signed)
 12/23 @ 3:51 pm Left voicemail for patient to call our office to be sch for consult.

## 2024-01-08 ENCOUNTER — Other Ambulatory Visit: Payer: Self-pay | Admitting: Physician Assistant

## 2024-01-08 DIAGNOSIS — F411 Generalized anxiety disorder: Secondary | ICD-10-CM

## 2024-01-10 ENCOUNTER — Ambulatory Visit (HOSPITAL_COMMUNITY)
Admission: RE | Admit: 2024-01-10 | Discharge: 2024-01-10 | Disposition: A | Discharge status: Home / Self Care | Source: Ambulatory Visit

## 2024-01-10 DIAGNOSIS — I2510 Atherosclerotic heart disease of native coronary artery without angina pectoris: Secondary | ICD-10-CM | POA: Diagnosis not present

## 2024-01-10 LAB — ECHOCARDIOGRAM COMPLETE
Area-P 1/2: 4.04 cm2
Calc EF: 53.4 %
S' Lateral: 3.2 cm
Single Plane A2C EF: 56.1 %
Single Plane A4C EF: 51.2 %

## 2024-01-15 DIAGNOSIS — M19072 Primary osteoarthritis, left ankle and foot: Secondary | ICD-10-CM | POA: Insufficient documentation

## 2024-01-15 NOTE — Progress Notes (Signed)
 " Radiation Oncology         [phone] ________________________________  Name: Gail Bailey        MRN: [id number]  Date of Service: 01/17/2024 DOB: Nov 27, 1960  CC:Gail Lukes, PA  [place], Gail Bailey, [place]     REFERRING PHYSICIAN: Gershon Gail Bailey, DPM   DIAGNOSIS: The encounter diagnosis was Primary osteoarthritis of left foot. F80.927    HISTORY OF PRESENT ILLNESS: Gail Bailey is a 64 y.o. female seen in consultation for radiation therapy.  She has a hx of moderate-severe osteoarthritis in multiple joints. Reports longstanding pain localized to the dorsal midfoot, in setting of prior crush injury as a child. Her pain recently worsened after another foot injury over the summer. Now her pain is persistent. She reports it is exacerbated by weight bearing and walking. The pain has progressively limited her activity and she is now unable to achieve her usual daily walking distance.  She has previously received corticosteroid injections which provided only short-term relief. She was recently fitted for orthotic inserts but has not yet tried them. Current pain management includes tylenol , tylenol  arthritis, topical diclofenac and OTC pain patches which she uses more frequently with increased activity. She is not able to take NSAIDs like ibuprofen  due to underlying cardiac disease. She also uses cold therapy which she read can help with arthritis. Cold therapy includes cold plunges which have been helpful.   She has arthritis in other joints including her knees and hips. She has had a prior knee replacement. When her foot pain is severe it also worsens the pain she feels in her knee and her hip. She can no longer wear any shoes but specialty athletic shoes. She cannot wear any sort of dress shoe without experiencing severe pain preventing her from walking altogether for a while after.  In general, her pain can reach a 7/10 in severity. It is constant. When it is at its worst she has to get  off of her feet and sit on the couch.   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 This exacerbates her sx. She also travels frequently for work which limits her ability to do cold plunges.  Recent radiographs do demonstrate osteoarthritis most pronounced at the midtarsal joint w/ joint space narrowing and osteophyte formation. Given persistent sx despite conservative measures, she has been referred here for consideration of LDRT.  Of note, she had melanoma on her RUE but no adjuvant chemo or RT  PREVIOUS RADIATION THERAPY: No  AUTOIMMUNE DISEASE: No  MEDICAL DEVICES: No  PREGNANCY: No    PAST MEDICAL HISTORY:  Past Medical History:  Diagnosis Date   Allergy    Anxiety    Asthma    CAD (coronary artery disease)    Cardiomyopathy (HCC)    a. Echo 8/16:  EF 40-45%, apical and ant-septal HK, Gr 2 DD   Diverticula, colon    Family history of adverse reaction to anesthesia    sisiter had PONV   GAD (generalized anxiety disorder)    Headache    rare migraines   History of cardiovascular stress test    Myoview  8/16:  EF 48%, anterior and apical defect c/w breast atten, no ischemia; Low Risk // Nuclear stress test 9/18: Low risk stress nuclear study with fixed septal defect consistent with LBBB vs prior infarct; no ischemia; EF 53 with septal akinesis.   Hyperlipidemia    Hypertension    Irregular periods/menstrual cycles  LBBB (left bundle branch block)    Melanoma (HCC)    right arm   Seasonal allergies    TIA (transient ischemic attack)    Torn meniscus    Vitamin B12 deficiency    Vitamin D  deficiency        PAST SURGICAL HISTORY: Past Surgical History:  Procedure Laterality Date   ABLATION     BREAST REDUCTION SURGERY     CARDIAC CATHETERIZATION     CESAREAN SECTION     x2   KNEE SURGERY     partial knee replacement right   MELANOMA EXCISION WITH SENTINEL LYMPH NODE BIOPSY Right 09/15/2017   Procedure: BIOPSY RIGHT SHOULDER  MOLE AND WIDE LOCAL EXCISION OF RIGHT ARM MELANOMA, ADVANCEMENT FLAP CLOSURE, SENTINEL LYMPH NODE BIOPSY  RIGHT ARM MELANOMA;  Surgeon: Aron Shoulders, MD;  Location: [facility];  Service: General;  Laterality: Right;   REDUCTION MAMMAPLASTY Bilateral    REPAIR QUADRICEPS / HAMSTRING MUSCLE     REPLACEMENT TOTAL KNEE Left 09/20/2018   Dr. Viann at [facility]   TONSILLECTOMY     tubes in ears  10/12     FAMILY HISTORY:  Family History  Problem Relation Age of Onset   Diabetes Mother    Heart disease Mother    Hypertension Mother    Heart attack Mother 71   Other Father        MVA   Asthma Sister    Diabetes Sister    Cancer Maternal Grandmother        Uterine   Breast cancer Maternal Grandmother    Heart attack Paternal Grandmother    Asthma Son    Colon cancer Neg Hx    Colon polyps Neg Hx    Esophageal cancer Neg Hx    Stomach cancer Neg Hx    Rectal cancer Neg Hx      SOCIAL HISTORY:  reports that she has never smoked. She has never used smokeless tobacco. She reports current alcohol use of about 3.0 - 4.0 standard drinks of alcohol per week. She reports that she does not use drugs.   ALLERGIES: Metoprolol  and Oxycodone    MEDICATIONS:  Current Outpatient Medications  Medication Sig Dispense Refill   albuterol  (VENTOLIN  HFA) 108 (90 Base) MCG/ACT inhaler Inhale 2 puffs into the lungs every 6 (six) hours as needed for wheezing. 2 each 1   amLODipine  (NORVASC ) 5 MG tablet TAKE 1 TABLET BY MOUTH EVERY DAY FOR BLOOD PRESSURE 90 tablet 3   amoxicillin -clavulanate (AUGMENTIN ) 875-125 MG tablet Take 1 tablet by mouth 2 (two) times daily. 14 tablet 0   aspirin  EC 81 MG tablet Take 1 tablet (81 mg total) by mouth daily.     budesonide -formoterol  (SYMBICORT ) 80-4.5 MCG/ACT inhaler Inhale 2 puffs into the lungs 2 (two) times daily. 1 each 1   escitalopram  (LEXAPRO ) 20 MG tablet TAKE 1 TABLET DAILY FOR MOOD & ANXIETY 90 tablet 1   estradiol  (ESTRACE ) 0.5 MG tablet Take  0.5 mg by mouth daily.     fluticasone  (FLONASE ) 50 MCG/ACT nasal spray Place 2 sprays into both nostrils as needed for allergies or rhinitis.     furosemide  (LASIX ) 40 MG tablet Take 1 tablet (40 mg total) by mouth daily. 90 tablet 1   medroxyPROGESTERone  (PROVERA ) 2.5 MG tablet Take 2.5 mg by mouth daily.     metoprolol  succinate (TOPROL -XL) 25 MG 24 hr tablet Take 0.5 tablets (12.5 mg total) by mouth daily. 8 tablet 0   montelukast  (  SINGULAIR ) 10 MG tablet Take 1 tablet daily for Allergies & Asthma 90 tablet 3   olmesartan  (BENICAR ) 20 MG tablet Take 1 tablet (20 mg total) by mouth daily. 30 tablet 0   Omega-3 Fatty Acids (FISH OIL PO) Take 1 capsule by mouth daily.     pantoprazole  (PROTONIX ) 40 MG tablet TAKE 1 TABLET BY MOUTH EVERY DAY 90 tablet 1   potassium chloride  (KLOR-CON ) 10 MEQ tablet Take 1 tablet (10 mEq total) by mouth 2 (two) times daily. 60 tablet 0   rosuvastatin  (CRESTOR ) 20 MG tablet Take 1 tablet (20 mg total) by mouth daily. 90 tablet 3   topiramate  (TOPAMAX ) 50 MG tablet Take 1 tablet (50 mg total) by mouth 2 (two) times daily. Migraines 180 tablet 0   triamcinolone  (KENALOG ) 0.025 % ointment Apply 1 Application topically 2 (two) times daily. 30 g 0   No current facility-administered medications for this encounter.     REVIEW OF SYSTEMS: The patient reports that she is doing well overall and a review of symptoms is otherwise negative.      PHYSICAL EXAM:  Wt Readings from Last 3 Encounters:  01/17/24 137 lb 12.8 oz (62.5 kg)  12/27/23 142 lb (64.4 kg)  11/25/23 142 lb (64.4 kg)   Temp Readings from Last 3 Encounters:  01/17/24 (!) 97.2 F (36.2 C) (Temporal)  12/27/23 97.9 F (36.6 C) (Temporal)  08/12/23 97.9 F (36.6 C) (Temporal)   BP Readings from Last 3 Encounters:  01/17/24 131/77  12/27/23 136/80  11/25/23 134/68   Pulse Readings from Last 3 Encounters:  01/17/24 67  12/27/23 70  11/25/23 75   Pain Assessment Pain Score: 0-No  pain/10   Physical Exam Vitals and nursing note reviewed.  Constitutional:      General: She is not in acute distress. HENT:     Head: Normocephalic.  Eyes:     Extraocular Movements: Extraocular movements intact.  Cardiovascular:     Rate and Rhythm: Normal rate.  Pulmonary:     Effort: Pulmonary effort is normal. No respiratory distress.  Abdominal:     General: There is no distension.  Musculoskeletal:        General: Normal range of motion.     Cervical back: Normal range of motion.     Right lower leg: Edema present.     Left lower leg: Edema present.  Skin:    General: Skin is dry.     Coloration: Skin is not pale.  Neurological:     General: No focal deficit present.     Mental Status: She is alert.  Psychiatric:        Mood and Affect: Mood normal.      LABORATORY DATA:  Lab Results  Component Value Date   WBC 8.2 05/03/2023   HGB 14.4 05/03/2023   HCT 43.8 05/03/2023   MCV 91.4 05/03/2023   PLT 311.0 05/03/2023   Lab Results  Component Value Date   NA 140 12/02/2023   K 3.6 12/02/2023   CL 101 12/02/2023   CO2 24 12/02/2023   Lab Results  Component Value Date   ALT 16 05/03/2023   AST 18 05/03/2023   ALKPHOS 61 05/03/2023   BILITOT 0.4 05/03/2023      RADIOGRAPHY:   X-Ray L Foot 12/30/23:  As interpreted by podiatrist: Osteoarthritis in the midfoot, particularly at the Lisfranc joint; joint space narrowing at the Lisfranc joint; osteoarthritis in the first metatarsophalangeal joint; dorsal osteophytes; plantar calcaneal spur (  Independently interpreted)  PATHOLOGY: no relevant pathology      IMPRESSION/PLAN:  Ms. Basque is a 64 yo F w/ a hx of L foot osteoarthritis no longer responsive to conservative measures and severely impacting her quality of life and ability to function at home and at work.   After a detailed discussion of the patients history, prior treatment response, and current goals, we reviewed the proposed protocol for LDRT  targeting the L foot. Our institutional approach follows a regimen of 3 Gy total, delivered in 6 fractions of 0.5 Gy each, over 2-3 weeks. This dosing is consistent with published guidelines and peer-reviewed data for non-malignant musculoskeletal conditions.  We reviewed:   Goals of care: symptom relief and improved mobility Expected timeline for therapeutic benefit: typically several weeks to months Potential risks, including rare but theoretical concerns regarding late tissue effects or secondary malignancy (risk estimated to be extremely low at this dose and age) Lack of immunosuppression and inactive autoimmune history, minimizing concern for immune-mediated complications   My goal with low dose radiation therapy is to help reduce chronic joint pain and improve mobility, particularly for activities like standing and walking for work. This treatment does not reverse joint damage, but it can calm the inflammation in and around the joint that contributes to pain. Based on published data, about 60-80% of patients with osteoarthritis experience meaningful pain relief within a few weeks to months after completing a short course of low-dose radiation. While not everyone responds, the majority of patients do report improvement, and the treatment is generally well tolerated.  The patient was agreeable to proceed. We will arrange CT simulation for planning and initiate treatment pending final review and setup. A follow-up visit will be scheduled approximately 8-12 weeks after completion of LDRT to assess clinical response.   We personally spent 60 minutes in this encounter including chart review, reviewing radiological studies, meeting face-to-face with the patient, entering orders and completing documentation.    Estefana HERO. Maritza, M.D.   "

## 2024-01-17 ENCOUNTER — Encounter: Payer: Self-pay | Admitting: Radiation Oncology

## 2024-01-17 ENCOUNTER — Ambulatory Visit
Admission: RE | Admit: 2024-01-17 | Discharge: 2024-01-17 | Disposition: A | Discharge status: Home / Self Care | Source: Ambulatory Visit | Attending: Radiation Oncology | Admitting: Radiation Oncology

## 2024-01-17 VITALS — BP 131/77 | HR 67 | Temp 97.2°F | Resp 18 | Wt 137.8 lb

## 2024-01-17 DIAGNOSIS — Z7982 Long term (current) use of aspirin: Secondary | ICD-10-CM | POA: Diagnosis not present

## 2024-01-17 DIAGNOSIS — Z803 Family history of malignant neoplasm of breast: Secondary | ICD-10-CM | POA: Diagnosis not present

## 2024-01-17 DIAGNOSIS — M170 Bilateral primary osteoarthritis of knee: Secondary | ICD-10-CM | POA: Diagnosis not present

## 2024-01-17 DIAGNOSIS — Z8673 Personal history of transient ischemic attack (TIA), and cerebral infarction without residual deficits: Secondary | ICD-10-CM | POA: Insufficient documentation

## 2024-01-17 DIAGNOSIS — Z7951 Long term (current) use of inhaled steroids: Secondary | ICD-10-CM | POA: Diagnosis not present

## 2024-01-17 DIAGNOSIS — I119 Hypertensive heart disease without heart failure: Secondary | ICD-10-CM | POA: Diagnosis not present

## 2024-01-17 DIAGNOSIS — Z79899 Other long term (current) drug therapy: Secondary | ICD-10-CM | POA: Diagnosis not present

## 2024-01-17 DIAGNOSIS — E559 Vitamin D deficiency, unspecified: Secondary | ICD-10-CM | POA: Insufficient documentation

## 2024-01-17 DIAGNOSIS — I2510 Atherosclerotic heart disease of native coronary artery without angina pectoris: Secondary | ICD-10-CM | POA: Insufficient documentation

## 2024-01-17 DIAGNOSIS — E538 Deficiency of other specified B group vitamins: Secondary | ICD-10-CM | POA: Diagnosis not present

## 2024-01-17 DIAGNOSIS — Z79818 Long term (current) use of other agents affecting estrogen receptors and estrogen levels: Secondary | ICD-10-CM | POA: Diagnosis not present

## 2024-01-17 DIAGNOSIS — E785 Hyperlipidemia, unspecified: Secondary | ICD-10-CM | POA: Insufficient documentation

## 2024-01-17 DIAGNOSIS — Z85820 Personal history of malignant melanoma of skin: Secondary | ICD-10-CM | POA: Insufficient documentation

## 2024-01-17 DIAGNOSIS — M19072 Primary osteoarthritis, left ankle and foot: Secondary | ICD-10-CM | POA: Diagnosis present

## 2024-01-17 NOTE — Progress Notes (Signed)
 Site of osteoarthritis: Primary osteoarthritis of left foot. F80.927   How long have you had pain? Since July 2025.  What over the counter or prescription medications have you tried? Current pain management includes tylenol , topical diclofenac and OTC pain patches which she uses more frequently with increased activity. She is not able to take NSAIDs like ibuprofen  due to underlying cardiac disease. corticosteroid injections which provided only short-term relief.   Does anything make the pain better or worse? Cold weather makes it worse. Cold plunges and rest helps.      Ambulatory status? Walker? Wheelchair?: Ambulatory  SAFETY ISSUES: Prior radiation? No Pacemaker/ICD? No Possible current pregnancy? No Is the patient on methotrexate? No  Current Complaints / other details:    BP 131/77 (BP Location: Left Arm, Patient Position: Sitting, Cuff Size: Normal)   Pulse 67   Temp (!) 97.2 F (36.2 C) (Temporal)   Resp 18   Wt 137 lb 12.8 oz (62.5 kg)   SpO2 100%   BMI 26.91 kg/m

## 2024-01-20 ENCOUNTER — Encounter: Payer: Self-pay | Admitting: Physician Assistant

## 2024-01-20 DIAGNOSIS — Z79899 Other long term (current) drug therapy: Secondary | ICD-10-CM

## 2024-01-20 MED ORDER — TOPIRAMATE 50 MG PO TABS: 50.0000 mg | ORAL_TABLET | Freq: Two times a day (BID) | ORAL | 1 refills | Status: AC

## 2024-01-23 ENCOUNTER — Other Ambulatory Visit: Payer: Self-pay | Admitting: Physician Assistant

## 2024-01-23 NOTE — Progress Notes (Unsigned)
" °   °  °  Cardiology Office Note Date:  01/27/2024  ID:  Gail Bailey, DOB 07/14/1960, MRN [id number] PCP:  Job Lukes, PA  Cardiologist:  Joelle VEAR Ren Donley, MD  No chief complaint on file.     Problems Dyspnea 11/25 Improvement with increasing lasix  to BID for a couple of days and holding metoprolol  Non-obstructive CAD Coronary CTA 4/22- 132 (93rd percentile), non-obstructive CAD TTE 5/22: 50-55%, GIDD, mod LA (from 40-45%, apical/ant-septal HK-likely LBBB 08/2014) NST 11/25: Low risk and paradoxical septal motion TTE 12/25: 55-60% TIA M: AE5, ASA81, FE40BID, Olmesartan  20, RN20  Visits 11/25: HA1C, LP, BNP, 2D Echo, lasix  to 40 BID, BMP/Mag in 2 weeks, ETT Myoview  1/26:  LP 11/25 LDL 75, TG 266; statin increased to 20 (from 10) following LP; cont FE40BID     ROS: Otherwise negative Discussed the use of AI scribe software for clinical note transcription with the patient, who gave verbal consent to proceed.  History of Present Illness Gail Bailey is a 65 year old female who presents for cardiovascular follow-up. She has experienced improvement in her shortness of breath since her last visit, which she attributes to a rib fracture sustained just before Thanksgiving, possibly involving a minor lung puncture. She reports that her diuretic was increased to twice daily and she has noticed improvement in her symptoms. She experiences minimal leg swelling, with only minor indentation from socks. She uses two pillows at night and does not wake up with breathing difficulties. Occasional snoring occurs when she is very tired, but she has been tested for sleep apnea with negative results. Her activity level has increased, and she is now walking more, aiming to reach 10,000 steps per day by the end of the month. Currently, she averages about 6,000 steps daily. She used to be a runner but has had knee replacement and hamstring reconstruction, so she now focuses on walking and stretching  exercises. She is interested in returning to yoga. She has started using an infrared sauna and cold plunge to help with her arthritis.   Physical Exam VS:  BP 118/78   Pulse 69   Ht 5' (1.524 m)   Wt 139 lb 3.2 oz (63.1 kg)   SpO2 99%   BMI 27.19 kg/m  , BMI Body mass index is 27.19 kg/m. GEN: Well nourished, well developed, in no acute distress HEENT: normal Neck: no JVD, carotid bruits, or masses Cardiac: RRR; no murmurs, rubs, or gallops,no edema  Respiratory:  CTAB bilaterally, normal work of breathing GI: soft, nontender, nondistended, + BS Extremities: No LE edema Skin: warm and dry, no rash Neuro:  Strength and sensation are intact  Recent Labs: Reviewed  Assessment & Plan  Diastolic Heart failure Symptoms resolved with diuretics.  - Continue lasix  40 mg BID twice daily. - Encouraged physical activity, aim for 10,000 steps daily. - Scheduled follow-up in one year.   Signed, Joelle VEAR Ren Donley, MD  01/27/2024 11:13 AM    [facility] "

## 2024-01-26 ENCOUNTER — Ambulatory Visit
Admission: RE | Admit: 2024-01-26 | Discharge: 2024-01-26 | Disposition: A | Discharge status: Home / Self Care | Source: Ambulatory Visit | Attending: Radiation Oncology | Admitting: Radiation Oncology

## 2024-01-26 DIAGNOSIS — M19072 Primary osteoarthritis, left ankle and foot: Secondary | ICD-10-CM | POA: Diagnosis present

## 2024-01-27 ENCOUNTER — Ambulatory Visit

## 2024-01-27 VITALS — BP 118/78 | HR 69 | Ht 60.0 in | Wt 139.2 lb

## 2024-01-27 DIAGNOSIS — I2510 Atherosclerotic heart disease of native coronary artery without angina pectoris: Secondary | ICD-10-CM

## 2024-01-27 DIAGNOSIS — I10 Essential (primary) hypertension: Secondary | ICD-10-CM | POA: Diagnosis not present

## 2024-01-27 DIAGNOSIS — E782 Mixed hyperlipidemia: Secondary | ICD-10-CM | POA: Diagnosis not present

## 2024-01-27 DIAGNOSIS — Z8673 Personal history of transient ischemic attack (TIA), and cerebral infarction without residual deficits: Secondary | ICD-10-CM

## 2024-01-27 DIAGNOSIS — E785 Hyperlipidemia, unspecified: Secondary | ICD-10-CM

## 2024-01-27 MED ORDER — FUROSEMIDE 40 MG PO TABS: 40.0000 mg | ORAL_TABLET | Freq: Two times a day (BID) | ORAL | 1 refills | Status: AC

## 2024-01-27 NOTE — Patient Instructions (Signed)
 Medication Instructions:  Your physician recommends that you continue on your current medications as directed. Please refer to the Current Medication list given to you today.  *If you need a refill on your cardiac medications before your next appointment, please call your pharmacy*  Follow-Up: At [facility], you and your health needs are our priority.  As part of our continuing mission to provide you with exceptional heart care, our providers are all part of one team.  This team includes your primary Cardiologist (physician) and Advanced Practice Providers or APPs (Physician Assistants and Nurse Practitioners) who all work together to provide you with the care you need, when you need it.  Your next appointment:   1 year(s)  Provider:   Joelle VEAR Ren Donley, MD

## 2024-01-28 ENCOUNTER — Telehealth: Payer: Self-pay | Admitting: Podiatry

## 2024-01-28 NOTE — Telephone Encounter (Signed)
 Orthotics are in [place] location. Left a message for Jenae Whalin to call the office to schedule appointment to [facility].

## 2024-02-01 DIAGNOSIS — M19072 Primary osteoarthritis, left ankle and foot: Secondary | ICD-10-CM | POA: Diagnosis not present

## 2024-02-14 ENCOUNTER — Ambulatory Visit: Admitting: Radiation Oncology

## 2024-02-15 ENCOUNTER — Ambulatory Visit (HOSPITAL_BASED_OUTPATIENT_CLINIC_OR_DEPARTMENT_OTHER): Payer: Self-pay | Admitting: Pulmonary Disease

## 2024-02-15 ENCOUNTER — Ambulatory Visit (HOSPITAL_BASED_OUTPATIENT_CLINIC_OR_DEPARTMENT_OTHER): Admitting: Pulmonary Disease

## 2024-02-15 ENCOUNTER — Ambulatory Visit (INDEPENDENT_AMBULATORY_CARE_PROVIDER_SITE_OTHER)

## 2024-02-15 ENCOUNTER — Encounter (HOSPITAL_BASED_OUTPATIENT_CLINIC_OR_DEPARTMENT_OTHER): Payer: Self-pay | Admitting: Pulmonary Disease

## 2024-02-15 VITALS — BP 136/76 | HR 70 | Ht 60.0 in | Wt 140.7 lb

## 2024-02-15 DIAGNOSIS — J4531 Mild persistent asthma with (acute) exacerbation: Secondary | ICD-10-CM

## 2024-02-15 MED ORDER — PREDNISONE 10 MG PO TABS: ORAL_TABLET | ORAL | 0 refills | Status: AC

## 2024-02-15 MED ORDER — ALBUTEROL SULFATE HFA 108 (90 BASE) MCG/ACT IN AERS: 2.0000 | INHALATION_SPRAY | Freq: Four times a day (QID) | RESPIRATORY_TRACT | 1 refills | Status: AC | PRN

## 2024-02-15 MED ORDER — AMOXICILLIN-POT CLAVULANATE 875-125 MG PO TABS: 1.0000 | ORAL_TABLET | Freq: Two times a day (BID) | ORAL | 0 refills | Status: AC

## 2024-02-15 MED ORDER — ALBUTEROL SULFATE (2.5 MG/3ML) 0.083% IN NEBU: 2.5000 mg | INHALATION_SOLUTION | Freq: Three times a day (TID) | RESPIRATORY_TRACT | 1 refills | Status: AC | PRN

## 2024-02-15 NOTE — Patient Instructions (Signed)
 Mild persistent asthma with acute exacerbation Co-comitant acute sinusitis --Prednisone  taper --Augmentin  --CONTINUE Symbicort  80-4.5 mcg TWO puffs in the morning and evening. Rinse mouth out after use --CONTINUE Albuterol  1-2 puffs AS NEEDED --CXR ordered. Please go to the 2nd floor

## 2024-02-15 NOTE — Assessment & Plan Note (Signed)
 Co-comitant acute sinusitis --Prednisone  taper --Augmentin  --CONTINUE Symbicort  80-4.5 mcg TWO puffs in the morning and evening. Rinse mouth out after use --CONTINUE Albuterol  1-2 puffs AS NEEDED --CXR ordered. Please go to the 2nd floor

## 2024-02-16 ENCOUNTER — Other Ambulatory Visit: Payer: Self-pay

## 2024-02-16 ENCOUNTER — Ambulatory Visit: Admission: RE | Admit: 2024-02-16 | Discharge: 2024-02-16 | Attending: Radiation Oncology

## 2024-02-16 LAB — RAD ONC ARIA SESSION SUMMARY
Course Elapsed Days: 0
Plan Fractions Treated to Date: 1
Plan Prescribed Dose Per Fraction: 0.5 Gy
Plan Total Fractions Prescribed: 6
Plan Total Prescribed Dose: 3 Gy
Reference Point Dosage Given to Date: 0.5 Gy
Reference Point Session Dosage Given: 0.5 Gy
Session Number: 1

## 2024-02-18 ENCOUNTER — Ambulatory Visit: Admission: RE | Admit: 2024-02-18 | Source: Ambulatory Visit

## 2024-02-18 ENCOUNTER — Other Ambulatory Visit: Payer: Self-pay

## 2024-02-18 LAB — RAD ONC ARIA SESSION SUMMARY
Course Elapsed Days: 2
Plan Fractions Treated to Date: 2
Plan Prescribed Dose Per Fraction: 0.5 Gy
Plan Total Fractions Prescribed: 6
Plan Total Prescribed Dose: 3 Gy
Reference Point Dosage Given to Date: 1 Gy
Reference Point Session Dosage Given: 0.5 Gy
Session Number: 2

## 2024-02-21 ENCOUNTER — Ambulatory Visit

## 2024-02-23 ENCOUNTER — Ambulatory Visit

## 2024-02-25 ENCOUNTER — Ambulatory Visit

## 2024-02-28 ENCOUNTER — Ambulatory Visit

## 2024-03-21 ENCOUNTER — Encounter: Admitting: Physician Assistant

## 2024-03-27 ENCOUNTER — Encounter: Admitting: Physician Assistant

## 2024-04-05 ENCOUNTER — Encounter (HOSPITAL_BASED_OUTPATIENT_CLINIC_OR_DEPARTMENT_OTHER)

## 2024-04-05 ENCOUNTER — Ambulatory Visit (HOSPITAL_BASED_OUTPATIENT_CLINIC_OR_DEPARTMENT_OTHER): Admitting: Pulmonary Disease
